# Patient Record
Sex: Female | Born: 1935 | Race: White | Hispanic: No | Marital: Married | Smoking: Never smoker
Health system: Southern US, Community
[De-identification: ages and names within clinical notes are randomized; demographics above are authoritative.]

## PROBLEM LIST (undated history)

## (undated) DIAGNOSIS — E669 Obesity, unspecified: Secondary | ICD-10-CM

## (undated) DIAGNOSIS — F329 Major depressive disorder, single episode, unspecified: Secondary | ICD-10-CM

## (undated) DIAGNOSIS — F32A Depression, unspecified: Secondary | ICD-10-CM

## (undated) DIAGNOSIS — I10 Essential (primary) hypertension: Secondary | ICD-10-CM

## (undated) DIAGNOSIS — E785 Hyperlipidemia, unspecified: Secondary | ICD-10-CM

## (undated) HISTORY — DX: Obesity, unspecified: E66.9

## (undated) HISTORY — DX: Major depressive disorder, single episode, unspecified: F32.9

## (undated) HISTORY — DX: Essential (primary) hypertension: I10

## (undated) HISTORY — PX: CHOLECYSTECTOMY: SHX55

## (undated) HISTORY — DX: Depression, unspecified: F32.A

## (undated) HISTORY — PX: ANKLE SURGERY: SHX546

## (undated) HISTORY — DX: Hyperlipidemia, unspecified: E78.5

---

## 2000-07-27 ENCOUNTER — Other Ambulatory Visit: Admission: RE | Admit: 2000-07-27 | Discharge: 2000-07-27 | Payer: Self-pay | Admitting: Family Medicine

## 2002-10-30 ENCOUNTER — Other Ambulatory Visit: Admission: RE | Admit: 2002-10-30 | Discharge: 2002-10-30 | Payer: Self-pay | Admitting: Family Medicine

## 2002-10-30 ENCOUNTER — Encounter: Payer: Self-pay | Admitting: Family Medicine

## 2002-10-30 LAB — CONVERTED CEMR LAB: Pap Smear: NORMAL

## 2003-08-06 ENCOUNTER — Emergency Department (HOSPITAL_COMMUNITY): Admission: EM | Admit: 2003-08-06 | Discharge: 2003-08-06 | Payer: Self-pay | Admitting: Emergency Medicine

## 2004-02-20 ENCOUNTER — Ambulatory Visit: Payer: Self-pay | Admitting: Family Medicine

## 2004-02-25 ENCOUNTER — Ambulatory Visit: Payer: Self-pay | Admitting: Family Medicine

## 2004-03-25 ENCOUNTER — Ambulatory Visit: Payer: Self-pay | Admitting: Family Medicine

## 2004-04-24 ENCOUNTER — Ambulatory Visit: Payer: Self-pay | Admitting: Family Medicine

## 2004-05-20 ENCOUNTER — Ambulatory Visit: Payer: Self-pay | Admitting: Family Medicine

## 2004-07-30 ENCOUNTER — Ambulatory Visit: Payer: Self-pay | Admitting: Family Medicine

## 2004-08-14 ENCOUNTER — Ambulatory Visit: Payer: Self-pay | Admitting: Family Medicine

## 2004-10-12 ENCOUNTER — Ambulatory Visit: Payer: Self-pay | Admitting: Family Medicine

## 2004-11-16 ENCOUNTER — Ambulatory Visit: Payer: Self-pay | Admitting: Family Medicine

## 2004-12-02 ENCOUNTER — Ambulatory Visit: Payer: Self-pay | Admitting: Family Medicine

## 2005-04-01 ENCOUNTER — Ambulatory Visit: Payer: Self-pay | Admitting: Family Medicine

## 2005-05-05 ENCOUNTER — Ambulatory Visit: Payer: Self-pay | Admitting: Family Medicine

## 2005-05-17 ENCOUNTER — Ambulatory Visit: Payer: Self-pay | Admitting: Family Medicine

## 2005-05-20 ENCOUNTER — Ambulatory Visit: Payer: Self-pay

## 2005-06-03 ENCOUNTER — Ambulatory Visit: Payer: Self-pay | Admitting: Family Medicine

## 2005-11-30 ENCOUNTER — Ambulatory Visit: Payer: Self-pay | Admitting: Family Medicine

## 2005-12-27 ENCOUNTER — Ambulatory Visit: Payer: Self-pay | Admitting: Family Medicine

## 2006-03-03 ENCOUNTER — Ambulatory Visit: Payer: Self-pay | Admitting: Family Medicine

## 2006-05-09 ENCOUNTER — Ambulatory Visit: Payer: Self-pay | Admitting: Family Medicine

## 2006-05-09 LAB — CONVERTED CEMR LAB
ALT: 17 units/L (ref 0–40)
AST: 22 units/L (ref 0–37)
Albumin: 3.2 g/dL — ABNORMAL LOW (ref 3.5–5.2)
CO2: 31 meq/L (ref 19–32)
Eosinophils Absolute: 0.2 10*3/uL (ref 0.0–0.6)
Eosinophils Relative: 3.9 % (ref 0.0–5.0)
GFR calc non Af Amer: 66 mL/min
Glucose, Bld: 159 mg/dL — ABNORMAL HIGH (ref 70–99)
HCT: 37.2 % (ref 36.0–46.0)
Hemoglobin: 13 g/dL (ref 12.0–15.0)
Hgb A1c MFr Bld: 7.5 %
Hgb A1c MFr Bld: 7.5 % — ABNORMAL HIGH (ref 4.6–6.0)
MCHC: 34.9 g/dL (ref 30.0–36.0)
MCV: 85.2 fL (ref 78.0–100.0)
Monocytes Absolute: 0.5 10*3/uL (ref 0.2–0.7)
Phosphorus: 3.2 mg/dL (ref 2.3–4.6)
Platelets: 262 10*3/uL (ref 150–400)
Potassium: 3.8 meq/L (ref 3.5–5.1)
Triglycerides: 128 mg/dL (ref 0–149)
VLDL: 26 mg/dL (ref 0–40)

## 2006-05-10 ENCOUNTER — Ambulatory Visit: Payer: Self-pay | Admitting: Family Medicine

## 2006-05-27 ENCOUNTER — Ambulatory Visit: Payer: Self-pay | Admitting: Family Medicine

## 2006-09-20 ENCOUNTER — Encounter: Payer: Self-pay | Admitting: Family Medicine

## 2006-09-20 DIAGNOSIS — E1140 Type 2 diabetes mellitus with diabetic neuropathy, unspecified: Secondary | ICD-10-CM | POA: Insufficient documentation

## 2006-09-20 DIAGNOSIS — E785 Hyperlipidemia, unspecified: Secondary | ICD-10-CM

## 2006-09-20 DIAGNOSIS — I10 Essential (primary) hypertension: Secondary | ICD-10-CM | POA: Insufficient documentation

## 2006-09-20 DIAGNOSIS — E1165 Type 2 diabetes mellitus with hyperglycemia: Secondary | ICD-10-CM

## 2006-09-20 DIAGNOSIS — M1990 Unspecified osteoarthritis, unspecified site: Secondary | ICD-10-CM

## 2006-09-20 DIAGNOSIS — F329 Major depressive disorder, single episode, unspecified: Secondary | ICD-10-CM

## 2006-09-20 DIAGNOSIS — E1169 Type 2 diabetes mellitus with other specified complication: Secondary | ICD-10-CM | POA: Insufficient documentation

## 2006-09-21 ENCOUNTER — Ambulatory Visit: Payer: Self-pay | Admitting: Family Medicine

## 2006-11-29 ENCOUNTER — Ambulatory Visit: Payer: Self-pay | Admitting: Family Medicine

## 2006-12-26 ENCOUNTER — Ambulatory Visit: Payer: Self-pay | Admitting: Family Medicine

## 2006-12-27 LAB — CONVERTED CEMR LAB
Cholesterol: 142 mg/dL (ref 0–200)
LDL Cholesterol: 66 mg/dL (ref 0–99)
VLDL: 27 mg/dL (ref 0–40)

## 2007-01-13 ENCOUNTER — Telehealth (INDEPENDENT_AMBULATORY_CARE_PROVIDER_SITE_OTHER): Payer: Self-pay | Admitting: *Deleted

## 2007-03-21 ENCOUNTER — Ambulatory Visit: Payer: Self-pay | Admitting: Family Medicine

## 2007-03-23 LAB — CONVERTED CEMR LAB: Hgb A1c MFr Bld: 6.9 % — ABNORMAL HIGH (ref 4.6–6.0)

## 2007-05-15 ENCOUNTER — Ambulatory Visit: Payer: Self-pay | Admitting: Family Medicine

## 2007-06-13 ENCOUNTER — Ambulatory Visit: Payer: Self-pay | Admitting: Family Medicine

## 2007-06-14 ENCOUNTER — Encounter: Payer: Self-pay | Admitting: Family Medicine

## 2007-06-14 ENCOUNTER — Encounter (INDEPENDENT_AMBULATORY_CARE_PROVIDER_SITE_OTHER): Payer: Self-pay | Admitting: *Deleted

## 2007-06-14 ENCOUNTER — Ambulatory Visit: Payer: Self-pay | Admitting: Family Medicine

## 2007-06-15 ENCOUNTER — Encounter (INDEPENDENT_AMBULATORY_CARE_PROVIDER_SITE_OTHER): Payer: Self-pay | Admitting: *Deleted

## 2007-06-21 ENCOUNTER — Ambulatory Visit: Payer: Self-pay | Admitting: Family Medicine

## 2007-06-21 DIAGNOSIS — M810 Age-related osteoporosis without current pathological fracture: Secondary | ICD-10-CM | POA: Insufficient documentation

## 2007-08-14 ENCOUNTER — Ambulatory Visit: Payer: Self-pay | Admitting: Family Medicine

## 2007-08-17 LAB — CONVERTED CEMR LAB
ALT: 17 units/L (ref 0–35)
AST: 24 units/L (ref 0–37)
Albumin: 3.1 g/dL — ABNORMAL LOW (ref 3.5–5.2)
BUN: 16 mg/dL (ref 6–23)
CO2: 32 meq/L (ref 19–32)
Cholesterol: 154 mg/dL (ref 0–200)
Creatinine,U: 90.7 mg/dL
GFR calc Af Amer: 80 mL/min
GFR calc non Af Amer: 66 mL/min
Hgb A1c MFr Bld: 7.9 % — ABNORMAL HIGH (ref 4.6–6.0)
LDL Cholesterol: 79 mg/dL (ref 0–99)
Microalb, Ur: 0.8 mg/dL (ref 0.0–1.9)
Sodium: 142 meq/L (ref 135–145)
Total CHOL/HDL Ratio: 3.2

## 2007-08-21 ENCOUNTER — Ambulatory Visit: Payer: Self-pay | Admitting: Family Medicine

## 2007-10-09 ENCOUNTER — Ambulatory Visit: Payer: Self-pay | Admitting: Family Medicine

## 2008-01-08 ENCOUNTER — Ambulatory Visit: Payer: Self-pay | Admitting: Family Medicine

## 2008-01-09 LAB — CONVERTED CEMR LAB
AST: 22 units/L (ref 0–37)
BUN: 16 mg/dL (ref 6–23)
Bilirubin, Direct: 0.1 mg/dL (ref 0.0–0.3)
Calcium: 9.6 mg/dL (ref 8.4–10.5)
Chloride: 102 meq/L (ref 96–112)
Creatinine, Ser: 1 mg/dL (ref 0.4–1.2)
Creatinine,U: 101.4 mg/dL
GFR calc Af Amer: 70 mL/min
GFR calc non Af Amer: 58 mL/min
Glucose, Bld: 122 mg/dL — ABNORMAL HIGH (ref 70–99)
HDL: 61.8 mg/dL (ref >39.0)
Microalb Creat Ratio: 5.9 mg/g (ref 0.0–30.0)
Sodium: 142 meq/L (ref 135–145)
Total Bilirubin: 0.8 mg/dL (ref 0.3–1.2)
VLDL: 29 mg/dL (ref 0–40)

## 2008-01-23 ENCOUNTER — Ambulatory Visit: Payer: Self-pay | Admitting: Family Medicine

## 2008-04-23 ENCOUNTER — Ambulatory Visit: Payer: Self-pay | Admitting: Family Medicine

## 2008-04-24 LAB — CONVERTED CEMR LAB
ALT: 16 units/L (ref 0–35)
AST: 20 units/L (ref 0–37)

## 2008-05-01 ENCOUNTER — Ambulatory Visit: Payer: Self-pay | Admitting: Family Medicine

## 2008-05-01 LAB — CONVERTED CEMR LAB
Bilirubin Urine: NEGATIVE
Glucose, Urine, Semiquant: NEGATIVE
Nitrite: NEGATIVE
Protein, U semiquant: NEGATIVE
Yeast, UA: 0

## 2008-05-02 ENCOUNTER — Encounter: Payer: Self-pay | Admitting: Family Medicine

## 2008-06-17 ENCOUNTER — Ambulatory Visit: Payer: Self-pay | Admitting: Family Medicine

## 2008-06-17 ENCOUNTER — Encounter: Payer: Self-pay | Admitting: Family Medicine

## 2008-06-19 ENCOUNTER — Ambulatory Visit: Payer: Self-pay | Admitting: Family Medicine

## 2008-06-21 ENCOUNTER — Encounter (INDEPENDENT_AMBULATORY_CARE_PROVIDER_SITE_OTHER): Payer: Self-pay | Admitting: *Deleted

## 2008-08-01 ENCOUNTER — Ambulatory Visit: Payer: Self-pay | Admitting: Family Medicine

## 2008-08-02 LAB — CONVERTED CEMR LAB
Albumin: 3.4 g/dL — ABNORMAL LOW (ref 3.5–5.2)
BUN: 23 mg/dL (ref 6–23)
CO2: 33 meq/L — ABNORMAL HIGH (ref 19–32)
Chloride: 104 meq/L (ref 96–112)
Cholesterol: 138 mg/dL (ref 0–200)
Creatinine, Ser: 1 mg/dL (ref 0.4–1.2)
Glucose, Bld: 127 mg/dL — ABNORMAL HIGH (ref 70–99)
HDL: 51.8 mg/dL (ref >39.00)
LDL Cholesterol: 63 mg/dL (ref 0–99)
Phosphorus: 3.7 mg/dL (ref 2.3–4.6)
Triglycerides: 115 mg/dL (ref 0.0–149.0)

## 2008-08-12 LAB — HM DIABETES EYE EXAM: HM Diabetic Eye Exam: NORMAL

## 2008-09-18 ENCOUNTER — Ambulatory Visit: Payer: Self-pay | Admitting: Family Medicine

## 2008-11-06 ENCOUNTER — Telehealth: Payer: Self-pay | Admitting: Family Medicine

## 2008-11-28 ENCOUNTER — Ambulatory Visit: Payer: Self-pay | Admitting: Family Medicine

## 2008-11-28 LAB — CONVERTED CEMR LAB
ALT: 16 units/L (ref 0–35)
AST: 22 units/L (ref 0–37)
BUN: 17 mg/dL (ref 6–23)
Calcium: 9.7 mg/dL (ref 8.4–10.5)
Creatinine, Ser: 1 mg/dL (ref 0.4–1.2)
Glucose, Bld: 118 mg/dL — ABNORMAL HIGH (ref 70–99)
Phosphorus: 3.2 mg/dL (ref 2.3–4.6)
Sodium: 141 meq/L (ref 135–145)

## 2009-03-24 ENCOUNTER — Ambulatory Visit: Payer: Self-pay | Admitting: Family Medicine

## 2009-04-08 ENCOUNTER — Ambulatory Visit: Payer: Self-pay | Admitting: Family Medicine

## 2009-09-19 ENCOUNTER — Ambulatory Visit: Payer: Self-pay | Admitting: Family Medicine

## 2009-09-20 LAB — CONVERTED CEMR LAB
ALT: 19 units/L (ref 0–35)
Cholesterol: 177 mg/dL (ref 0–200)
Eosinophils Absolute: 0.4 10*3/uL (ref 0.0–0.7)
Eosinophils Relative: 5.3 % — ABNORMAL HIGH (ref 0.0–5.0)
HCT: 35 % — ABNORMAL LOW (ref 36.0–46.0)
HDL: 45 mg/dL (ref >39.00)
Hemoglobin: 12 g/dL (ref 12.0–15.0)
Lymphocytes Relative: 24 % (ref 12.0–46.0)
Lymphs Abs: 1.8 10*3/uL (ref 0.7–4.0)
MCHC: 34.3 g/dL (ref 30.0–36.0)
Microalb Creat Ratio: 1.7 mg/g (ref 0.0–30.0)
Monocytes Relative: 8.4 % (ref 3.0–12.0)
Neutrophils Relative %: 61.8 % (ref 43.0–77.0)
RDW: 13.9 % (ref 11.5–14.6)
TSH: 4.77 microintl units/mL (ref 0.35–5.50)
Total CHOL/HDL Ratio: 4
WBC: 7.5 10*3/uL (ref 4.5–10.5)

## 2009-10-02 ENCOUNTER — Ambulatory Visit: Payer: Self-pay | Admitting: Family Medicine

## 2009-10-02 DIAGNOSIS — F4321 Adjustment disorder with depressed mood: Secondary | ICD-10-CM | POA: Insufficient documentation

## 2009-12-31 ENCOUNTER — Ambulatory Visit: Payer: Self-pay | Admitting: Family Medicine

## 2009-12-31 LAB — CONVERTED CEMR LAB
AST: 25 units/L (ref 0–37)
Albumin: 3.4 g/dL — ABNORMAL LOW (ref 3.5–5.2)
BUN: 24 mg/dL — ABNORMAL HIGH (ref 6–23)
CO2: 31 meq/L (ref 19–32)
Creatinine, Ser: 0.9 mg/dL (ref 0.4–1.2)
GFR calc non Af Amer: 65.33 mL/min (ref >60)
Glucose, Bld: 140 mg/dL — ABNORMAL HIGH (ref 70–99)
HDL: 49.3 mg/dL (ref >39.00)
Sodium: 142 meq/L (ref 135–145)
Total CHOL/HDL Ratio: 3
Triglycerides: 190 mg/dL — ABNORMAL HIGH (ref 0.0–149.0)

## 2010-01-05 ENCOUNTER — Ambulatory Visit: Payer: Self-pay | Admitting: Family Medicine

## 2010-04-07 ENCOUNTER — Ambulatory Visit
Admission: RE | Admit: 2010-04-07 | Discharge: 2010-04-07 | Payer: Self-pay | Source: Home / Self Care | Attending: Family Medicine | Admitting: Family Medicine

## 2010-04-08 LAB — CONVERTED CEMR LAB
AST: 21 units/L (ref 0–37)
Albumin: 3.2 g/dL — ABNORMAL LOW (ref 3.5–5.2)
BUN: 23 mg/dL (ref 6–23)
Bilirubin, Direct: 0.1 mg/dL (ref 0.0–0.3)
Calcium: 10 mg/dL (ref 8.4–10.5)
Creatinine, Ser: 0.8 mg/dL (ref 0.4–1.2)
GFR calc non Af Amer: 72.68 mL/min (ref >60.00)
Phosphorus: 2.8 mg/dL (ref 2.3–4.6)

## 2010-04-10 ENCOUNTER — Ambulatory Visit
Admission: RE | Admit: 2010-04-10 | Discharge: 2010-04-10 | Payer: Self-pay | Source: Home / Self Care | Attending: Family Medicine | Admitting: Family Medicine

## 2010-04-10 DIAGNOSIS — M25569 Pain in unspecified knee: Secondary | ICD-10-CM | POA: Insufficient documentation

## 2010-04-15 ENCOUNTER — Encounter (INDEPENDENT_AMBULATORY_CARE_PROVIDER_SITE_OTHER): Payer: Self-pay | Admitting: *Deleted

## 2010-05-05 NOTE — Assessment & Plan Note (Signed)
Summary: 3 MONTH FOLLOW UP/[facility]   Vital Signs:  Patient profile:   75 year old female Height:      63 inches Weight:      192 pounds BMI:     34.13 Temp:     98.4 degrees F oral Pulse rate:   80 / minute Pulse rhythm:   regular BP sitting:   130 / 72  (left arm) Cuff size:   large  Vitals Entered By: Lewanda Rife LPN (January 05, 2010 9:09 AM)  Serial Vital Signs/Assessments:  Time      Position  BP       Pulse  Resp  Temp     By                     130/72                         Judith Part MD  CC: three month f/u   History of Present Illness: here for f/u of lipid/ DM and HTN   bp still up at 148/76-- then 130/72 after 10 min of sitting  AIC 7.6- fairly stable - not optimal  is more or less on max oral tx  not much exercise -- not motivated to do it -- also chronic pain limits her to short distances  is trying with diet -- is cheating occasionally  portions are small  bread is biggest problem , not a lot of pasta or potato  has good understanding of DM diet  ams - good one teens to 130  pm usually higher some 160s- low 200s    lipids are much better with trig 190, HDL 49 and LDL 64 on zocor   wt is stable   grief rxn-- just a little bit better  thinks she will eventually be ok -- but stays down and dismotivated at times     Allergies: 1)  ! Fosamax  Past History:  Past Medical History: Last updated: 05/01/2008 Depression Diabetes mellitus, type II Hypertension Osteoarthritis Hyperlipidemia osteoporosis obesity  opthy- Dr Clayborne Dana   Past Surgical History: Last updated: 06/21/2007 Cholecystectomy ABI's- normal (10/1999) S/ p fall- ankle surgery 92005) ORIF left medial malleblus fracture Adenosine cardiolite- neg EF 77% (11/2003) Carotid dopplers-minimal plaque (05/2005) dexa 09/11/2022 OP  Family History: Last updated: 2008-07-19 Father: deceased- bronchitis, asthma Mother: deceased- DM, HTN Siblings: 2 brothers with asthma, 1 sister with HTN MGM  breast cancer, DM sister has lung cancer- deceased 11-Sep-2022 brother copd   Social History: Last updated: 01/23/2008 Marital Status: Married Children: 7  Never Smoked  Risk Factors: Smoking Status: never (05/15/2007)  Review of Systems General:  Complains of fatigue. Eyes:  Denies blurring, eye irritation, and eye pain. CV:  Denies chest pain or discomfort, lightheadness, and palpitations. Resp:  Denies cough and shortness of breath. GI:  Denies change in bowel habits, indigestion, nausea, and vomiting. MS:  Denies muscle aches and cramps. Derm:  Denies itching, lesion(s), poor wound healing, and rash. Neuro:  Denies numbness and tingling. Psych:  Complains of depression; denies panic attacks, sense of great danger, and suicidal thoughts/plans. Endo:  Denies cold intolerance, excessive thirst, excessive urination, and heat intolerance. Heme:  Denies abnormal bruising and bleeding.  Physical Exam  General:  overweight but generally well appearing  Head:  normocephalic, atraumatic, and no abnormalities observed.   Eyes:  vision grossly intact, pupils equal, pupils round, and pupils reactive to light.  no conjunctival pallor, injection or icterus  Mouth:  pharynx pink and moist.   Neck:  supple with full rom and no masses or thyromegally, no JVD or carotid bruit  Chest Wall:  No deformities, masses, or tenderness noted. Lungs:  Normal respiratory effort, chest expands symmetrically. Lungs are clear to auscultation, no crackles or wheezes. Heart:  Normal rate and regular rhythm. S1 and S2 normal without gallop, murmur, click, rub or other extra sounds. Abdomen:  Bowel sounds positive,abdomen soft and non-tender without masses, organomegaly or hernias noted. no renal bruits  Msk:  No deformity or scoliosis noted of thoracic or lumbar spine.   Pulses:  R and L carotid,radial,femoral,dorsalis pedis and posterior tibial pulses are full and equal bilaterally Extremities:  No clubbing,  cyanosis, edema, or deformity noted with normal full range of motion of all joints.   Neurologic:  sensation intact to light touch, gait normal, and DTRs symmetrical and normal.   Skin:  Intact without suspicious lesions or rashes Cervical Nodes:  No lymphadenopathy noted Inguinal Nodes:  No significant adenopathy Psych:  seems generally down but a bit improved from last visit   Diabetes Management Exam:    Foot Exam (with socks and/or shoes not present):       Sensory-Pinprick/Light touch:          Left medial foot (L-4): normal          Left dorsal foot (L-5): normal          Left lateral foot (S-1): normal          Right medial foot (L-4): normal          Right dorsal foot (L-5): normal          Right lateral foot (S-1): normal       Sensory-Monofilament:          Left foot: normal          Right foot: normal       Inspection:          Left foot: normal          Right foot: normal       Nails:          Left foot: normal          Right foot: normal   Impression & Recommendations:  Problem # 1:  HYPERLIPIDEMIA (ICD-272.4) Assessment Improved  this is much imp with zocor compliance with better diet  rev lab with pt  rev low sat fat diet in detail f/u 3 mo Her updated medication list for this problem includes:    Zocor 20 Mg Tabs (Simvastatin) .Marland Kitchen... 1 by mouth once daily  Labs Reviewed: SGOT: 25 (12/31/2009)   SGPT: 19 (12/31/2009)   HDL:49.30 (12/31/2009), 45.00 (09/19/2009)  LDL:64 (12/31/2009), 63 (08/01/2008)  Chol:151 (12/31/2009), 177 (09/19/2009)  Trig:190.0 (12/31/2009), 239.0 (09/19/2009)  Orders: Prescription Created Electronically [phone])  Problem # 2:  HYPERTENSION (ICD-401.9) Assessment: Improved  better on 2nd check  ok at home enc whatever activity she can do  f/u 3 mo  Her updated medication list for this problem includes:    Benicar Hct 40-25 Mg Tabs (Olmesartan medoxomil-hctz) .Marland Kitchen... Take one by mouth daily  BP today: 130/72 Prior BP: 148/68  (10/02/2009)  Labs Reviewed: K+: 4.2 (12/31/2009) Creat: : 0.9 (12/31/2009)   Chol: 151 (12/31/2009)   HDL: 49.30 (12/31/2009)   LDL: 64 (12/31/2009)   TG: 190.0 (12/31/2009)  Orders: Prescription Created Electronically [phone])  Problem # 3:  DIABETES MELLITUS, TYPE II (ICD-250.00) Assessment: Deteriorated  this is worse today diet is so / so - rev this  activity is limited by chronic pain inc actos to 30 with warnings about swelling/ sob -- adv to call if any side eff and pt voiced understanding if no successs- will need to disc inj tx  lab and f/u 3 mo  check sugar two times a day  Her updated medication list for this problem includes:    Glucophage 1000 Mg Tabs (Metformin hcl) .Marland Kitchen... Take one by mouth two times a day    Glucotrol Xl 10 Mg Tb24 (Glipizide) .Marland Kitchen... Take one by mouth daily    Benicar Hct 40-25 Mg Tabs (Olmesartan medoxomil-hctz) .Marland Kitchen... Take one by mouth daily    Adult Aspirin Low Strength 81 Mg Tbdp (Aspirin) .Marland Kitchen... Take one by mouth daily    Actos 30 Mg Tabs (Pioglitazone hcl) .Marland Kitchen... 1 by mouth once daily  Orders: Prescription Created Electronically [phone])  Problem # 4:  GRIEF REACTION (ICD-309.0) Assessment: Unchanged  this is stable  pt has good support and says she is doing ok  this aff her motivation however  Orders: Prescription Created Electronically [phone])  Complete Medication List: 1)  Glucophage 1000 Mg Tabs (Metformin hcl) .... Take one by mouth two times a day 2)  Glucotrol Xl 10 Mg Tb24 (Glipizide) .... Take one by mouth daily 3)  Prozac 40 Mg Caps (Fluoxetine hcl) .... Take one by mouth daily 4)  Zocor 20 Mg Tabs (Simvastatin) .Marland Kitchen.. 1 by mouth once daily 5)  Benicar Hct 40-25 Mg Tabs (Olmesartan medoxomil-hctz) .... Take one by mouth daily 6)  Adult Aspirin Low Strength 81 Mg Tbdp (Aspirin) .... Take one by mouth daily 7)  Flexeril 10 Mg Tabs (Cyclobenzaprine hcl) .... 1/2 to 1 by mouth three times a day as needed neck pain 8)  Actos 30 Mg Tabs  (Pioglitazone hcl) .Marland Kitchen.. 1 by mouth once daily  Other Orders: Flu Vaccine 73yrs + MEDICARE PATIENTS (J4782) Administration Flu vaccine - MCR (N5621)  Patient Instructions: 1)  increase your total actos dose to 30 mg once daily  (new px will be for 30) 2)  keep checking sugar two times a day  3)  eat diabetic diet  4)  exercise when you can - stay active  5)  flu shot today  6)  schedule labs AIC, renal , hepatic 250.0, in 3 months and then f/u Prescriptions: ZOCOR 20 MG  TABS (SIMVASTATIN) 1 by mouth once daily  #90 x 3   Entered and Authorized by:   Judith Part MD   Signed by:   Judith Part MD on 01/05/2010   Method used:   Electronically to        [facility]. [address] [phone]* (retail)       [address] [place], [place]  [address]       Ph: [phone]       Fax: [phone]   RxID:   [id number] PROZAC 40 MG  CAPS (FLUOXETINE HCL) take one by mouth daily  #90 x 3   Entered and Authorized by:   Judith Part MD   Signed by:   Judith Part MD on 01/05/2010   Method used:   Electronically to        [facility]. [employer] [facility] [phone]* (retail)       [address]  [place], [place]  [address]       Ph: [phone]       Fax: [phone]   RxID:   [id number] GLUCOTROL XL 10 MG  TB24 (GLIPIZIDE) take one by mouth daily  #90 x 3   Entered and Authorized by:   Judith Part MD   Signed by:   Judith Part MD on 01/05/2010   Method used:   Electronically to        [facility]. [address] [phone]* (retail)       [address] [place], [place]  [id number]       Ph: [phone]       Fax: [phone]   RxID:   [id number] GLUCOPHAGE 1000 MG  TABS (METFORMIN HCL) take one by mouth two times a day  #180 x 3   Entered and Authorized by:   Judith Part MD   Signed by:   Judith Part MD on 01/05/2010   Method used:   Electronically to        [facility]. [address] [phone]* (retail)       [address] [place],  [place]  [address]       Ph: [phone]       Fax: [phone]   RxID:   [id number] ACTOS 30 MG TABS (PIOGLITAZONE HCL) 1 by mouth once daily  #90 x 3   Entered and Authorized by:   Judith Part MD   Signed by:   Judith Part MD on 01/05/2010   Method used:   Electronically to        [facility]. [address] [phone]* (retail)       [address] [place], [place]  [address]       Ph: [phone]       Fax: [phone]   RxID:   [id number]   Current Allergies (reviewed today): ! FOSAMAX    Flu Vaccine Consent Questions     Do you have a history of severe allergic reactions to this vaccine? no    Any prior history of allergic reactions to egg and/or gelatin? no    Do you have a sensitivity to the preservative Thimersol? no    Do you have a past history of Guillan-Barre Syndrome? no    Do you currently have an acute febrile illness? no    Have you ever had a severe reaction to latex? no    Vaccine information given and explained to patient? yes    Are you currently pregnant? no    Lot Number:[id number]   Exp Date:10/03/2010   Site Given  Left Deltoid IMedflu Lewanda Rife LPN  January 05, 2010 9:14 AM

## 2010-05-05 NOTE — Assessment & Plan Note (Signed)
Summary: 30 MIN APPT 6 MONTH FOLLOW UP/[facility]   Vital Signs:  Patient profile:   75 year old female Height:      63 inches Weight:      192.50 pounds BMI:     34.22 Temp:     98.5 degrees F oral Pulse rate:   80 / minute Pulse rhythm:   regular BP sitting:   148 / 68  (left arm) Cuff size:   large  Vitals Entered By: Lewanda Rife LPN (October 02, 2009 9:18 AM)  Serial Vital Signs/Assessments:  Time      Position  BP       Pulse  Resp  Temp     By                     138/80                         Judith Part MD  CC: six month f/u   History of Present Illness: here for f/u of HTN and /dm and lipids  had a bad month overall  daughter and her husband were killed in a car accident - was a big shock  has a lot of family around -- good support  has had some counseling from her preacher   does not feel like she needs to talk to someone professionally  has been just in the dumps   feeling physically ok she thinks   wt is stable   bp is 148/68 today first check    lipids up with trig 239 and LDL 107 up from the 60s  no missed doses of zocor  once in a while fried food -- everyone brought chicken to her house- eating what people are bringing over    dm is worse - AIC is 7.5 (from 6.9) and also inc microalb  ? if worse from stress  is eating off and on - and staying sugar free for the most part -- does eat a lot of fruit  does not know exactly what she ate   no problems with her medicines   broke out into a rash - back and [place] -- is clearing up now - left her skin rough  no fever or insect bites no new products- but does use sunscreen   Allergies: 1)  ! Fosamax  Past History:  Past Medical History: Last updated: 05/01/2008 Depression Diabetes mellitus, type II Hypertension Osteoarthritis Hyperlipidemia osteoporosis obesity  opthy- Dr Clayborne Dana   Past Surgical History: Last updated: 06/21/2007 Cholecystectomy ABI's- normal (10/1999) S/ p fall- ankle  surgery 92005) ORIF left medial malleblus fracture Adenosine cardiolite- neg EF 77% (11/2003) Carotid dopplers-minimal plaque (05/2005) dexa 08-29-22 OP  Family History: Last updated: Jul 06, 2008 Father: deceased- bronchitis, asthma Mother: deceased- DM, HTN Siblings: 2 brothers with asthma, 1 sister with HTN MGM breast cancer, DM sister has lung cancer- deceased Aug 29, 2022 brother copd   Social History: Last updated: 01/23/2008 Marital Status: Married Children: 7  Never Smoked  Risk Factors: Smoking Status: never (05/15/2007)  Review of Systems General:  Denies fatigue and malaise. CV:  Denies chest pain or discomfort, lightheadness, and palpitations. Resp:  Denies cough and wheezing. GI:  Denies abdominal pain, change in bowel habits, and indigestion. MS:  Denies muscle aches and cramps. Derm:  Denies lesion(s), poor wound healing, and rash. Neuro:  Denies numbness and tingling. Psych:  Complains of easily tearful. Endo:  Denies excessive thirst and  excessive urination.  Physical Exam  General:  overweight but generally well appearing  Head:  normocephalic, atraumatic, and no abnormalities observed.   Eyes:  vision grossly intact, pupils equal, pupils round, and pupils reactive to light.   Neck:  supple with full rom and no masses or thyromegally, no JVD or carotid bruit  Lungs:  Normal respiratory effort, chest expands symmetrically. Lungs are clear to auscultation, no crackles or wheezes. Heart:  Normal rate and regular rhythm. S1 and S2 normal without gallop, murmur, click, rub or other extra sounds. Abdomen:  Bowel sounds positive,abdomen soft and non-tender without masses, organomegaly or hernias noted. no renal bruits  Msk:  No deformity or scoliosis noted of thoracic or lumbar spine.   Pulses:  R and L carotid,radial,femoral,dorsalis pedis and posterior tibial pulses are full and equal bilaterally Extremities:  No clubbing, cyanosis, edema, or deformity noted with normal full  range of motion of all joints.   Neurologic:  sensation intact to light touch, gait normal, and DTRs symmetrical and normal.   Skin:  Intact without suspicious lesions or rashes Cervical Nodes:  No lymphadenopathy noted Inguinal Nodes:  No significant adenopathy Psych:  is generally sad but not tearful  seems fatigued  good eye contact  Diabetes Management Exam:    Foot Exam (with socks and/or shoes not present):       Sensory-Pinprick/Light touch:          Left medial foot (L-4): normal          Left dorsal foot (L-5): normal          Left lateral foot (S-1): normal          Right medial foot (L-4): normal          Right dorsal foot (L-5): normal          Right lateral foot (S-1): normal       Sensory-Monofilament:          Left foot: normal          Right foot: normal       Inspection:          Left foot: normal          Right foot: normal       Nails:          Left foot: normal          Right foot: normal   Impression & Recommendations:  Problem # 1:  HYPERLIPIDEMIA (ICD-272.4) Assessment Deteriorated  this is up due to poor diet with grief rxn disc getting back to low sat fat diet and pt is knowledgable about this  re check 3 mo and f/u Her updated medication list for this problem includes:    Zocor 20 Mg Tabs (Simvastatin) .Marland Kitchen... 1 by mouth once daily  Labs Reviewed: SGOT: 24 (09/19/2009)   SGPT: 19 (09/19/2009)   HDL:45.00 (09/19/2009), 51.80 (08/01/2008)  LDL:63 (08/01/2008), 71 (01/08/2008)  Chol:177 (09/19/2009), 138 (08/01/2008)  Trig:239.0 (09/19/2009), 115.0 (08/01/2008)  Problem # 2:  HYPERTENSION (ICD-401.9) Assessment: Deteriorated  bp imp on second check - pt also nervous today with grief rxn re check at f/u Her updated medication list for this problem includes:    Benicar Hct 40-25 Mg Tabs (Olmesartan medoxomil-hctz) .Marland Kitchen... Take one by mouth daily  BP today: 148/68-- re check 138/80 Prior BP: 132/78 (04/08/2009)  Labs Reviewed: K+: 3.7  (11/28/2008) Creat: : 1.0 (11/28/2008)   Chol: 177 (09/19/2009)   HDL: 45.00 (09/19/2009)   LDL:  63 (08/01/2008)   TG: 239.0 (09/19/2009)  BP today: 148/68 Prior BP: 132/78 (04/08/2009)  Labs Reviewed: K+: 3.7 (11/28/2008) Creat: : 1.0 (11/28/2008)   Chol: 177 (09/19/2009)   HDL: 45.00 (09/19/2009)   LDL: 63 (08/01/2008)   TG: 239.0 (09/19/2009)  Problem # 3:  DIABETES MELLITUS, TYPE II (ICD-250.00) Assessment: Deteriorated  worse control with poor diet during grief rxn  disc getting back to normal diet - with less sugar and fat  plans to go to beach and do lot of walking also  no change in med - lab 3 mo and f/u Her updated medication list for this problem includes:    Glucophage 1000 Mg Tabs (Metformin hcl) .Marland Kitchen... Take one by mouth two times a day    Glucotrol Xl 10 Mg Tb24 (Glipizide) .Marland Kitchen... Take one by mouth daily    Benicar Hct 40-25 Mg Tabs (Olmesartan medoxomil-hctz) .Marland Kitchen... Take one by mouth daily    Adult Aspirin Low Strength 81 Mg Tbdp (Aspirin) .Marland Kitchen... Take one by mouth daily    Actos 15 Mg Tabs (Pioglitazone hcl) .Marland Kitchen... 1 by mouth each am  Labs Reviewed: Creat: 1.0 (11/28/2008)     Last Eye Exam: normal (08/03/2008) Reviewed HgBA1c results: 7.5 (09/19/2009)  6.9 (11/28/2008)  Problem # 4:  GRIEF REACTION (ICD-309.0) with loss of son and DIL in auto accident  overall doing fairly- good support with family and pastoral counseling  appetite coming back  offered counseling if needed - mental health -- she declined this or med  disc stressors/ coping mech and symptoms in detail today  Complete Medication List: 1)  Glucophage 1000 Mg Tabs (Metformin hcl) .... Take one by mouth two times a day 2)  Glucotrol Xl 10 Mg Tb24 (Glipizide) .... Take one by mouth daily 3)  Prozac 40 Mg Caps (Fluoxetine hcl) .... Take one by mouth daily 4)  Zocor 20 Mg Tabs (Simvastatin) .Marland Kitchen.. 1 by mouth once daily 5)  Benicar Hct 40-25 Mg Tabs (Olmesartan medoxomil-hctz) .... Take one by mouth  daily 6)  Adult Aspirin Low Strength 81 Mg Tbdp (Aspirin) .... Take one by mouth daily 7)  Flexeril 10 Mg Tabs (Cyclobenzaprine hcl) .... 1/2 to 1 by mouth three times a day as needed neck pain 8)  Actos 15 Mg Tabs (Pioglitazone hcl) .Marland Kitchen.. 1 by mouth each am  Patient Instructions: 1)  for rash - use a moisturizer like eucerin or lubriderm lotion/ creams  2)  aaveno oatmeal bath is helpful 3)  avoid hot water  4)  update me if not improving  5)  no change in medicines 6)  update me if you feel you need to see a mental health counselor - continue talking to your family and pastor  7)  schdule fasting lab 3 months and then follow up lipid/ast/alt / renal /AIC 250.0 and 272   Current Allergies (reviewed today): ! FOSAMAX

## 2010-05-05 NOTE — Assessment & Plan Note (Signed)
Summary: 2 WEEK FOLLOW UP BP CHECK AND FLU SHOT/[facility]   Nurse Visit   Vital Signs:  Patient profile:   75 year old female Weight:      192 pounds Pulse rate:   80 / minute Pulse rhythm:   regular BP sitting:   132 / 78  (left arm) Cuff size:   large  Vitals Entered By: Lowella Petties CMA (April 08, 2009 3:43 PM) CC: Nurse visit-  BP check, flu shot   Allergies: 1)  ! Fosamax  Immunizations Administered:  Influenza Vaccine # 1:    Vaccine Type: Fluvax MCR    Site: left deltoid    Mfr: GlaxoSmithK[employer]    Dose: 0.5 ml    Route: IM    Given by: Lowella Petties CMA    Exp. Date: 10/02/2009    Lot #: [id number]    VIS given: 10/27/06 version given April 08, 2009.  Orders Added: 1)  Influenza Vaccine MCR [00025] 2)  Est. Patient Level I [66440]

## 2010-05-07 NOTE — Miscellaneous (Signed)
Summary: med list update  Medications Added ACTOS 15 MG TABS (PIOGLITAZONE HCL) take one by mouth daily       Clinical Lists Changes  Medications: Changed medication from ACTOS 30 MG TABS (PIOGLITAZONE HCL) 1/2  by mouth once daily to ACTOS 15 MG TABS (PIOGLITAZONE HCL) take one by mouth daily     Prior Medications: GLUCOPHAGE 1000 MG  TABS (METFORMIN HCL) take one by mouth two times a day GLUCOTROL XL 10 MG  TB24 (GLIPIZIDE) take one by mouth daily PROZAC 40 MG  CAPS (FLUOXETINE HCL) take one by mouth daily ZOCOR 20 MG  TABS (SIMVASTATIN) 1 by mouth once daily BENICAR HCT 40-25 MG  TABS (OLMESARTAN MEDOXOMIL-HCTZ) take one by mouth daily ADULT ASPIRIN LOW STRENGTH 81 MG  TBDP (ASPIRIN) take one by mouth daily FLEXERIL 10 MG  TABS (CYCLOBENZAPRINE HCL) 1/2 to 1 by mouth three times a day as needed neck pain ACTOS 15 MG TABS (PIOGLITAZONE HCL) take one by mouth daily WELLBUTRIN XL 150 MG XR24H-TAB (BUPROPION HCL) 1 by mouth once daily in am Current Allergies: ! FOSAMAX

## 2010-05-07 NOTE — Assessment & Plan Note (Signed)
Summary: F/U AFTER LABS / LFW   Vital Signs:  Patient profile:   75 year old female Height:      63 inches Weight:      190 pounds BMI:     33.78 Temp:     98.8 degrees F oral Pulse rate:   80 / minute Pulse rhythm:   regular BP sitting:   148 / 80  (left arm) Cuff size:   large  Vitals Entered By: Lewanda Rife LPN (April 10, 2010 8:30 AM) CC: three month f/u after labs   History of Present Illness: here for f/u of DM after inc in actos and HTN - also having GI distress, R knee pain and possible worse depression   has had a lot of gas for the past couple of months-- flatus and also heartburn a lot  ? 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 Fosamax  Past History:  Past Medical History: Last updated: 05/01/2008 Depression Diabetes mellitus, type II Hypertension Osteoarthritis Hyperlipidemia osteoporosis obesity  opthy- Dr Clayborne Dana   Past Surgical History: Last updated: 06/21/2007 Cholecystectomy ABI's- normal (10/1999) S/ p fall- ankle surgery 92005) ORIF left medial malleblus fracture Adenosine cardiolite- neg EF 77% (11/2003) Carotid dopplers-minimal plaque (05/2005) dexa 09-06-2022 OP  Family History: Last updated: 07/14/2008 Father: deceased- bronchitis, asthma Mother: deceased- DM, HTN Siblings: 2 brothers with asthma, 1 sister with HTN MGM breast cancer, DM sister has lung cancer-  deceased Sep 06, 2022 brother copd   Social History: Last updated: 01/23/2008 Marital Status: Married Children: 7  Never Smoked  Risk Factors: Smoking Status: never (05/15/2007)  Review of Systems General:  Complains of fatigue; denies loss of appetite and malaise. Eyes:  Denies blurring and eye irritation. CV:  Denies chest pain or discomfort, palpitations, and shortness of breath with exertion. Resp:  Denies cough and shortness of breath. GI:  Complains of change in bowel habits, gas, and indigestion; denies loss of appetite, nausea, and vomiting. MS:  Complains of joint pain; denies joint redness and joint swelling. Neuro:  Denies headaches, numbness, and tingling. Psych:  Complains of depression and irritability; denies suicidal thoughts/plans. Endo:  Denies cold intolerance, excessive thirst, excessive urination, and heat intolerance. Heme:  Denies abnormal bruising and bleeding.  Physical Exam  General:  overweight but generally well appearing  Head:  normocephalic, atraumatic, and no abnormalities observed.   Eyes:  vision grossly intact, pupils equal, pupils round, and pupils reactive to light.  no conjunctival pallor, injection or icterus  Neck:  supple with full rom and no masses or thyromegally, no JVD or carotid bruit  Chest Wall:  No deformities, masses, or tenderness noted. Lungs:  Normal respiratory effort, chest expands symmetrically. Lungs are clear  to auscultation, no crackles or wheezes. Heart:  Normal rate and regular rhythm. S1 and S2 normal without gallop, murmur, click, rub or other extra sounds. Abdomen:  Bowel sounds positive,abdomen soft and non-tender without masses, organomegaly or hernias noted. no renal bruits  Msk:  No deformity or scoliosis noted of thoracic or lumbar spine.  poor rom both knees worse on R Pulses:  R and L carotid,radial,femoral,dorsalis pedis and posterior tibial pulses are full and equal bilaterally Extremities:  No clubbing, cyanosis,  edema, or deformity noted with normal full range of motion of all joints.   Neurologic:  sensation intact to light touch, gait normal, and DTRs symmetrical and normal.   Skin:  Intact without suspicious lesions or rashes Cervical Nodes:  No lymphadenopathy noted Inguinal Nodes:  No significant adenopathy Psych:  is seemingly sad and dysmotivated - no SI also fatigued   Diabetes Management Exam:    Foot Exam (with socks and/or shoes not present):       Sensory-Pinprick/Light touch:          Left medial foot (L-4): normal          Left dorsal foot (L-5): normal          Left lateral foot (S-1): normal          Right medial foot (L-4): normal          Right dorsal foot (L-5): normal          Right lateral foot (S-1): normal       Sensory-Monofilament:          Left foot: normal          Right foot: normal       Inspection:          Left foot: normal          Right foot: normal       Nails:          Left foot: normal          Right foot: normal   Impression & Recommendations:  Problem # 1:  DIABETES MELLITUS, TYPE II (ICD-250.00) Assessment Improved  this is imp on inc actos but pt not tolerating it GI wise will go back to 15 mg and update if not imp  watch sugars disc healthy diet (low simple sugar/ choose complex carbs/ low sat fat) diet and exercise in detail  pt not motivated due to dep will work on that and f/u 1 mo to make plan for depression Her updated medication list for this problem includes:    Glucophage 1000 Mg Tabs (Metformin hcl) .Marland Kitchen... Take one by mouth two times a day    Glucotrol Xl 10 Mg Tb24 (Glipizide) .Marland Kitchen... Take one by mouth daily    Benicar Hct 40-25 Mg Tabs (Olmesartan medoxomil-hctz) .Marland Kitchen... Take one by mouth daily    Adult Aspirin Low Strength 81 Mg Tbdp (Aspirin) .Marland Kitchen... Take one by mouth daily    Actos 30 Mg Tabs (Pioglitazone hcl) .Marland Kitchen... 1/2  by mouth once daily  Labs Reviewed: Creat: 0.8 (04/07/2010)     Last Eye Exam: normal (08/03/2008) Reviewed  HgBA1c results: 7.3 (04/07/2010)  7.6 (12/31/2009)  Orders: Prescription Created Electronically [id number])  Problem # 2:  DEPRESSION (ICD-311) Assessment: Deteriorated  worse lately with dismotivation/ fatigue/sleepiness/ resentment/ anger gets counseling through her church -- enc her to continue that  add wellbutrin - asked to update if any side eff incl worse dep  may be able to dec/wean  prozac later  spent 25 minutes face to face time with pt , over 50% of which was spent on counseling and coordination of care   Her updated medication list for this problem includes:    Prozac 40 Mg Caps (Fluoxetine hcl) .Marland Kitchen... Take one by mouth daily    Wellbutrin Xl 150 Mg Xr24h-tab (Bupropion hcl) .Marland Kitchen... 1 by mouth once daily in am  Orders: Prescription Created Electronically [phone])  Problem # 3:  HYPERTENSION (ICD-401.9) Assessment: Deteriorated  bp up a bit today- but she is somewhat distressed  will re check at 1 mo f/u Her updated medication list for this problem includes:    Benicar Hct 40-25 Mg Tabs (Olmesartan medoxomil-hctz) .Marland Kitchen... Take one by mouth daily  BP today: 148/80 Prior BP: 130/72 (01/05/2010)  Labs Reviewed: K+: 4.0 (04/07/2010) Creat: : 0.8 (04/07/2010)   Chol: 151 (12/31/2009)   HDL: 49.30 (12/31/2009)   LDL: 64 (12/31/2009)   TG: 190.0 (12/31/2009)  Orders: Prescription Created Electronically [phone])  Problem # 4:  KNEE PAIN (UJW-119.14) Assessment: New  now causing gait disorder and limitation with mobility suspect OA ? ref to Dr Patsy Lager Her updated medication list for this problem includes:    Adult Aspirin Low Strength 81 Mg Tbdp (Aspirin) .Marland Kitchen... Take one by mouth daily    Flexeril 10 Mg Tabs (Cyclobenzaprine hcl) .Marland Kitchen... 1/2 to 1 by mouth three times a day as needed neck pain  Orders: Prescription Created Electronically [id number])  Complete Medication List: 1)  Glucophage 1000 Mg Tabs (Metformin hcl) .... Take one by mouth two times a day 2)  Glucotrol Xl 10 Mg  Tb24 (Glipizide) .... Take one by mouth daily 3)  Prozac 40 Mg Caps (Fluoxetine hcl) .... Take one by mouth daily 4)  Zocor 20 Mg Tabs (Simvastatin) .Marland Kitchen.. 1 by mouth once daily 5)  Benicar Hct 40-25 Mg Tabs (Olmesartan medoxomil-hctz) .... Take one by mouth daily 6)  Adult Aspirin Low Strength 81 Mg Tbdp (Aspirin) .... Take one by mouth daily 7)  Flexeril 10 Mg Tabs (Cyclobenzaprine hcl) .... 1/2 to 1 by mouth three times a day as needed neck pain 8)  Actos 30 Mg Tabs (Pioglitazone hcl) .... 1/2  by mouth once daily 9)  Wellbutrin Xl 150 Mg Xr24h-tab (Bupropion hcl) .Marland Kitchen.. 1 by mouth once daily in am  Patient Instructions: 1)  please schedule appt with Dr Patsy Lager for knee and ankle pain and instability  2)  please start wellbutrin - to see if this helps with depression  3)  cut actos in 1/2 and go back to 15 mg daily 4)  if your gas and heartburn do not improve within 10 days- call and let me know 5)  follow up with me in 1 month Prescriptions: WELLBUTRIN XL 150 MG XR24H-TAB (BUPROPION HCL) 1 by mouth once daily in am  #30 x 11   Entered and Authorized by:   Judith Part MD   Signed by:   Judith Part MD on 04/10/2010   Method used:   Electronically to        [facility]. [address] [phone]* (retail)       [address] [place], [place]  [address]       Ph: [phone]       Fax: [phone]   RxID:   [id number]    Orders Added: 1)  Prescription Created Electronically [[id number]] 2)  Est. Patient Level IV [56433]    Current  Allergies (reviewed today): ! FOSAMAX

## 2010-05-11 ENCOUNTER — Encounter: Payer: Self-pay | Admitting: Family Medicine

## 2010-05-11 ENCOUNTER — Ambulatory Visit (INDEPENDENT_AMBULATORY_CARE_PROVIDER_SITE_OTHER): Payer: Medicare Other | Admitting: Family Medicine

## 2010-05-11 DIAGNOSIS — F3289 Other specified depressive episodes: Secondary | ICD-10-CM

## 2010-05-11 DIAGNOSIS — E119 Type 2 diabetes mellitus without complications: Secondary | ICD-10-CM

## 2010-05-11 DIAGNOSIS — F329 Major depressive disorder, single episode, unspecified: Secondary | ICD-10-CM

## 2010-05-11 DIAGNOSIS — I10 Essential (primary) hypertension: Secondary | ICD-10-CM

## 2010-05-11 DIAGNOSIS — E785 Hyperlipidemia, unspecified: Secondary | ICD-10-CM

## 2010-05-11 LAB — HM DIABETES FOOT EXAM

## 2010-05-21 NOTE — Assessment & Plan Note (Signed)
Summary: 1 MTH FOLLOW-UP  Medications Added WELLBUTRIN XL 300 MG XR24H-TAB (BUPROPION HCL) 1 by mouth once daily       Nurse Visit   Vital Signs:  Patient profile:   75 year old female Height:      63 inches Weight:      190.50 pounds BMI:     33.87 Temp:     98.6 degrees F oral Pulse rate:   80 / minute Pulse rhythm:   regular BP sitting:   132 / 60  (left arm) Cuff size:   large  Vitals Entered By: Lewanda Rife LPN (May 11, 2010 9:03 AM)  History of Present Illness: here for f/u of depression and DM and HTN  wt is stable bmi is 33   bp is 132/60 better than last time  did start wellbutrin last visit and enc counseling  thinks she is a lot better  not shaky and nervous  less impulsive and irritable  motivation is just a bit better  feels a little joy now and then  has gone to church for counseling  no side eff at all  is interested in inc dose   gas and heartburn are a lot better   actos was cut back to 15 due to intol of 30 is eating better overall  is checking sugars at home -- 120-130 in am and pm       Impression & Recommendations:  Problem # 1:  DEPRESSION (ICD-311) Assessment Improved  this is much imp with addn of wellbutrin xl 150 and counseling will adv dose to 300 at pt request -- update if side eff enc exercise as tol  enc to continue counseling f/u 3 mo  Her updated medication list for this problem includes:    Prozac 40 Mg Caps (Fluoxetine hcl) .Marland Kitchen... Take one by mouth daily    Wellbutrin Xl 300 Mg Xr24h-tab (Bupropion hcl) .Marland Kitchen... 1 by mouth once daily  Orders: Prescription Created Electronically [phone])  Problem # 2:  HYPERLIPIDEMIA (ICD-272.4) Assessment: Unchanged well controlled on statin and diet  recheck 3 mo and f/u Her updated medication list for this problem includes:    Zocor 20 Mg Tabs (Simvastatin) .Marland Kitchen... 1 by mouth once daily  Labs Reviewed: SGOT: 21 (04/07/2010)   SGPT: 16 (04/07/2010)   HDL:49.30 (12/31/2009),  45.00 (09/19/2009)  LDL:64 (12/31/2009), 63 (08/01/2008)  Chol:151 (12/31/2009), 177 (09/19/2009)  Trig:190.0 (12/31/2009), 239.0 (09/19/2009)  Problem # 3:  HYPERTENSION (ICD-401.9) Assessment: Improved  bp is improved with less stress rxn no change in med  lab and f/u 3 mo  Her updated medication list for this problem includes:    Benicar Hct 40-25 Mg Tabs (Olmesartan medoxomil-hctz) .Marland Kitchen... Take one by mouth daily  BP today: 132/60 Prior BP: 148/80 (04/10/2010)  Labs Reviewed: K+: 4.0 (04/07/2010) Creat: : 0.8 (04/07/2010)   Chol: 151 (12/31/2009)   HDL: 49.30 (12/31/2009)   LDL: 64 (12/31/2009)   TG: 190.0 (12/31/2009)  Problem # 4:  DIABETES MELLITUS, TYPE II (ICD-250.00) Assessment: Improved  sugars are improved at all with better diet (motivation ) lab in 3 mo and then f/u  urged to continue DM diet with smaller portions Her updated medication list for this problem includes:    Glucophage 1000 Mg Tabs (Metformin hcl) .Marland Kitchen... Take one by mouth two times a day    Glucotrol Xl 10 Mg Tb24 (Glipizide) .Marland Kitchen... Take one by mouth daily    Benicar Hct 40-25 Mg Tabs (Olmesartan medoxomil-hctz) .Marland Kitchen... Take one  by mouth daily    Adult Aspirin Low Strength 81 Mg Tbdp (Aspirin) .Marland Kitchen... Take one by mouth daily    Actos 15 Mg Tabs (Pioglitazone hcl) .Marland Kitchen... Take one by mouth daily  Labs Reviewed: Creat: 0.8 (04/07/2010)     Last Eye Exam: normal (08/03/2008) Reviewed HgBA1c results: 7.3 (04/07/2010)  7.6 (12/31/2009)  Orders: Prescription Created Electronically [phone])  Complete Medication List: 1)  Glucophage 1000 Mg Tabs (Metformin hcl) .... Take one by mouth two times a day 2)  Glucotrol Xl 10 Mg Tb24 (Glipizide) .... Take one by mouth daily 3)  Prozac 40 Mg Caps (Fluoxetine hcl) .... Take one by mouth daily 4)  Zocor 20 Mg Tabs (Simvastatin) .Marland Kitchen.. 1 by mouth once daily 5)  Benicar Hct 40-25 Mg Tabs (Olmesartan medoxomil-hctz) .... Take one by mouth daily 6)  Adult Aspirin Low Strength  81 Mg Tbdp (Aspirin) .... Take one by mouth daily 7)  Actos 15 Mg Tabs (Pioglitazone hcl) .... Take one by mouth daily 8)  Wellbutrin Xl 300 Mg Xr24h-tab (Bupropion hcl) .Marland Kitchen.. 1 by mouth once daily   Physical Exam  General:  overweight but generally well appearing  affect is brighter  Head:  normocephalic, atraumatic, and no abnormalities observed.   Eyes:  vision grossly intact, pupils equal, pupils round, and pupils reactive to light.   Mouth:  pharynx pink and moist.   Neck:  supple with full rom and no masses or thyromegally, no JVD or carotid bruit  Chest Wall:  No deformities, masses, or tenderness noted. Lungs:  Normal respiratory effort, chest expands symmetrically. Lungs are clear to auscultation, no crackles or wheezes. Heart:  Normal rate and regular rhythm. S1 and S2 normal without gallop, murmur, click, rub or other extra sounds. Abdomen:  Bowel sounds positive,abdomen soft and non-tender without masses, organomegaly or hernias noted. no renal bruits  Msk:  poor rom knees Pulses:  R and L carotid,radial,femoral,dorsalis pedis and posterior tibial pulses are full and equal bilaterally Extremities:  No clubbing, cyanosis, edema, or deformity noted with normal full range of motion of all joints.   Neurologic:  sensation intact to light touch, gait normal, and DTRs symmetrical and normal.   Skin:  Intact without suspicious lesions or rashes Cervical Nodes:  No lymphadenopathy noted Psych:  more cheerful and animated today good eye contact   Diabetes Management Exam:    Foot Exam (with socks and/or shoes not present):       Sensory-Pinprick/Light touch:          Left medial foot (L-4): normal          Left dorsal foot (L-5): normal          Left lateral foot (S-1): normal          Right medial foot (L-4): normal          Right dorsal foot (L-5): normal          Right lateral foot (S-1): normal       Sensory-Monofilament:          Left foot: normal          Right foot:  normal       Inspection:          Left foot: normal          Right foot: normal       Nails:          Left foot: normal          Right  foot: normal   Past History:  Past Surgical History: Last updated: 06/21/2007 Cholecystectomy ABI's- normal (10/1999) S/ p fall- ankle surgery 92005) ORIF left medial malleblus fracture Adenosine cardiolite- neg EF 77% (11/2003) Carotid dopplers-minimal plaque (05/2005) dexa 08/30/22 OP  Family History: Last updated: July 07, 2008 Father: deceased- bronchitis, asthma Mother: deceased- DM, HTN Siblings: 2 brothers with asthma, 1 sister with HTN MGM breast cancer, DM sister has lung cancer- deceased 2022/08/30 brother copd   Social History: Last updated: 01/23/2008 Marital Status: Married Children: 7  Never Smoked  Risk Factors: Smoking Status: never (05/15/2007)  Past Medical History: Depression Diabetes mellitus, type II Hypertension Osteoarthritis Hyperlipidemia osteoporosis obesity  opthy- Dr Clayborne Dana  counseling - pastoral   Review of Systems General:  Denies fatigue, loss of appetite, and malaise. Eyes:  Denies blurring and eye irritation. CV:  Denies chest pain or discomfort, palpitations, shortness of breath with exertion, and swelling of feet. Resp:  Denies cough and shortness of breath. GI:  Denies abdominal pain, change in bowel habits, gas, indigestion, nausea, and vomiting. GU:  Denies urinary frequency. MS:  Complains of joint pain and stiffness; denies muscle aches and cramps. Derm:  Denies itching, lesion(s), poor wound healing, and rash. Neuro:  Denies headaches, numbness, and tingling. Psych:  Complains of depression; denies easily tearful, irritability, panic attacks, and sense of great danger; overall better. Endo:  Denies cold intolerance, excessive thirst, excessive urination, and heat intolerance. Heme:  Denies abnormal bruising and bleeding.   Patient Instructions: 1)  increase your wellbutrin from 150 to 300 mg  once daily 2)  if any problems or side effects please let me know  3)  keep working on healthy diet and exercise  4)  I'm glad you are doing better  5)  schedule labs fasting in 3 months and then follow up 6)  lipid/ast/alt/renal / AIC   250.0 and 401.1 and 272   CC: one month f/u   Allergies: 1)  ! Fosamax  Orders Added: 1)  Est. Patient Level IV [16109] 2)  Prescription Created Electronically [id number] Prescriptions: WELLBUTRIN XL 300 MG XR24H-TAB (BUPROPION HCL) 1 by mouth once daily  #90 x 3   Entered and Authorized by:   Judith Part MD   Signed by:   Judith Part MD on 05/11/2010   Method used:   Electronically to        [facility]. [address] [phone]* (retail)       [address] [place], [place]  [address]       Ph: [phone]       Fax: [phone]   RxID:   [id number] ACTOS 15 MG TABS (PIOGLITAZONE HCL) take one by mouth daily  #90 x 3   Entered and Authorized by:   Judith Part MD   Signed by:   Judith Part MD on 05/11/2010   Method used:   Electronically to        [facility]. [address] [phone]* (retail)       [address] [place], [place]  [address]       Ph: [phone]       Fax: [phone]   RxID:   [id number]   Current Allergies (reviewed today): ! FOSAMAX

## 2010-06-13 ENCOUNTER — Encounter: Payer: Self-pay | Admitting: Family Medicine

## 2010-08-04 ENCOUNTER — Other Ambulatory Visit: Payer: Self-pay | Admitting: Family Medicine

## 2010-08-04 DIAGNOSIS — E7800 Pure hypercholesterolemia, unspecified: Secondary | ICD-10-CM

## 2010-08-04 DIAGNOSIS — I10 Essential (primary) hypertension: Secondary | ICD-10-CM

## 2010-08-05 ENCOUNTER — Other Ambulatory Visit (INDEPENDENT_AMBULATORY_CARE_PROVIDER_SITE_OTHER): Payer: Medicare Other | Admitting: Family Medicine

## 2010-08-05 DIAGNOSIS — E7800 Pure hypercholesterolemia, unspecified: Secondary | ICD-10-CM

## 2010-08-05 DIAGNOSIS — I10 Essential (primary) hypertension: Secondary | ICD-10-CM

## 2010-08-05 DIAGNOSIS — E119 Type 2 diabetes mellitus without complications: Secondary | ICD-10-CM

## 2010-08-05 LAB — RENAL FUNCTION PANEL
CO2: 30 mEq/L (ref 19–32)
Calcium: 10 mg/dL (ref 8.4–10.5)
Chloride: 102 mEq/L (ref 96–112)
Potassium: 3.9 mEq/L (ref 3.5–5.1)
Sodium: 141 mEq/L (ref 135–145)

## 2010-08-05 LAB — LIPID PANEL
Cholesterol: 147 mg/dL (ref 0–200)
HDL: 52.2 mg/dL (ref >39.00)
LDL Cholesterol: 65 mg/dL (ref 0–99)

## 2010-08-06 LAB — HEMOGLOBIN A1C: Hgb A1c MFr Bld: 6.9 % — ABNORMAL HIGH (ref 4.6–6.5)

## 2010-08-10 ENCOUNTER — Encounter: Payer: Self-pay | Admitting: Family Medicine

## 2010-08-10 ENCOUNTER — Ambulatory Visit (INDEPENDENT_AMBULATORY_CARE_PROVIDER_SITE_OTHER): Payer: Medicare Other | Admitting: Family Medicine

## 2010-08-10 DIAGNOSIS — F329 Major depressive disorder, single episode, unspecified: Secondary | ICD-10-CM

## 2010-08-10 DIAGNOSIS — I10 Essential (primary) hypertension: Secondary | ICD-10-CM

## 2010-08-10 DIAGNOSIS — E785 Hyperlipidemia, unspecified: Secondary | ICD-10-CM

## 2010-08-10 DIAGNOSIS — E119 Type 2 diabetes mellitus without complications: Secondary | ICD-10-CM

## 2010-08-10 NOTE — Assessment & Plan Note (Signed)
This is stable - well controlled with statin and diet  Rev low sat fat diet- doing well with that  Lab and f/u planned for 6 mo

## 2010-08-10 NOTE — Assessment & Plan Note (Signed)
Stable (better on 2nd check today) Urged to stay active and keep working on weight loss F/u 6 mo after labs

## 2010-08-10 NOTE — Progress Notes (Signed)
Subjective:    Patient ID: Amanda Benson, female    DOB: [birth year], 75 y.o.   MRN: [id number]  HPI Here for f/u of DM and lipids and HTN   Wt is down 6 lb Is working on that  Drinking more water   HTN is fair with 144/74 today- up a bit  On benicar hct  No ha or cp or edema    Lipids are well controlled with statin and diet  HDL is 52 and LDL is 64 Lab Results  Component Value Date   CHOL 147 08/05/2010   CHOL 151 12/31/2009   CHOL 177 09/19/2009   Lab Results  Component Value Date   HDL 52.20 08/05/2010   HDL 49.30 12/31/2009   HDL 40.98 09/19/2009   Lab Results  Component Value Date   LDLCALC 65 08/05/2010   LDLCALC 64 12/31/2009   LDLCALC 63 08/01/2008   Lab Results  Component Value Date   TRIG 150.0* 08/05/2010   TRIG 190.0* 12/31/2009   TRIG 239.0* 09/19/2009   Lab Results  Component Value Date   CHOLHDL 3 08/05/2010   CHOLHDL 3 12/31/2009   CHOLHDL 4 09/19/2009   Lab Results  Component Value Date   LDLDIRECT 107.2 09/19/2009     DM is improved  a1c is 6.9 down from 7.3 Is checking sugars at home and thinks they are good -  A few high ones but for the most part 130s or below in am and 150s or below in afternoon  Is eating better too  Also lost a little wt  On metformin and actos  DM eye exam -- last may - is due for that - will make her own appt   occ gets dizzy  --when she is walking -- is a little light headed - stops a second and then fine No falls - knows to sit down when it gets bad  Thinks this may be age related   Mood is better too - that is good too  Is ready for a good summer Opened her pool   Past Medical History  Diagnosis Date  . Depression   . Diabetes mellitus     type II  . Hypertension   . Osteoporosis   . Hyperlipidemia   . Obesity     History   Social History  . Marital Status: Married    Spouse Name: N/A    Number of Children: 7  . Years of Education: N/A   Occupational History  .     Social History Main Topics  .  Smoking status: Never Smoker   . Smokeless tobacco: Not on file  . Alcohol Use: Not on file  . Drug Use: Not on file  . Sexually Active: Not on file   Other Topics Concern  . Not on file   Social History Narrative  . No narrative on file    Review of Systems Review of Systems  Constitutional: Negative for fever, appetite change, fatigue and unexpected weight change.  Eyes: Negative for pain and visual disturbance.  Respiratory: Negative for cough and shortness of breath.   Cardiovascular: Negative.   Gastrointestinal: Negative for nausea, diarrhea and constipation.  Genitourinary: Negative for urgency and frequency.  Skin: Negative for pallor.  Neurological: Negative for weakness, light-headedness, numbness and headaches.  Hematological: Negative for adenopathy. Does not bruise/bleed easily.  Psychiatric/Behavioral: Negative for dysphoric mood at this time (improved). The patient is not nervous/anxious.  Objective:   Physical Exam  Constitutional: She appears well-developed and well-nourished. No distress.  HENT:  Head: Normocephalic and atraumatic.  Mouth/Throat: Oropharynx is clear and moist.  Eyes: Conjunctivae and EOM are normal. Pupils are equal, round, and reactive to light.  Neck: Normal range of motion. Neck supple. No JVD present. No thyromegaly present.  Cardiovascular: Normal rate, regular rhythm and normal heart sounds.   Pulmonary/Chest: Effort normal and breath sounds normal. No respiratory distress. She has no wheezes.  Abdominal: Soft. Bowel sounds are normal. She exhibits no distension and no mass. There is no rebound.  Musculoskeletal: She exhibits no edema and no tenderness.  Lymphadenopathy:    She has no cervical adenopathy.  Neurological: She is alert. She has normal reflexes. Coordination normal.  Skin: Skin is warm and dry. No rash noted. No erythema. No pallor.  Psychiatric: She has a normal mood and affect.       Improved affect! Animated  and much less depressed Better eye contact           Assessment & Plan:

## 2010-08-10 NOTE — Assessment & Plan Note (Signed)
Improved with wt loss and better diet/ more activity Will make own opthy exam appt F/u after labs 6 mo  Rev low glycemic diet Enc further wt loss

## 2010-08-10 NOTE — Patient Instructions (Addendum)
Keep working on healthy diet and exercise Diabetes is improving  Other labs are stable No change in medicines Schedule fasting labs and then follow up in 6 months  Don't forget to make your yearly eye doctor appt

## 2010-08-10 NOTE — Assessment & Plan Note (Signed)
Quite a bit improved today with wellbutrin and prozac  Glad to see it  Pt is more motivated as well

## 2010-12-23 ENCOUNTER — Encounter: Payer: Self-pay | Admitting: Family Medicine

## 2010-12-23 ENCOUNTER — Ambulatory Visit (INDEPENDENT_AMBULATORY_CARE_PROVIDER_SITE_OTHER): Payer: Medicare Other | Admitting: Family Medicine

## 2010-12-23 VITALS — BP 140/72 | HR 84 | Temp 98.7°F | Ht 63.0 in | Wt 179.5 lb

## 2010-12-23 DIAGNOSIS — N390 Urinary tract infection, site not specified: Secondary | ICD-10-CM | POA: Insufficient documentation

## 2010-12-23 DIAGNOSIS — E119 Type 2 diabetes mellitus without complications: Secondary | ICD-10-CM

## 2010-12-23 DIAGNOSIS — R350 Frequency of micturition: Secondary | ICD-10-CM

## 2010-12-23 LAB — POCT URINALYSIS DIPSTICK
Bilirubin, UA: NEGATIVE
Glucose, UA: NEGATIVE
Nitrite, UA: POSITIVE

## 2010-12-23 LAB — POCT UA - MICROSCOPIC ONLY
Crystals, Ur, HPF, POC: 0
Epithelial cells, urine per micros: 1

## 2010-12-23 MED ORDER — CIPROFLOXACIN HCL 250 MG PO TABS: 250.0000 mg | ORAL_TABLET | Freq: Two times a day (BID) | ORAL | Status: AC

## 2010-12-23 MED ORDER — ONETOUCH ULTRA SYSTEM W/DEVICE KIT: PACK | Status: DC

## 2010-12-23 NOTE — Assessment & Plan Note (Signed)
tx with cipro for uti  Uncomplicated but has had it for 3 weeks Adv fluids and update if worse or not imp

## 2010-12-23 NOTE — Progress Notes (Signed)
Subjective:    Patient ID: Amanda Benson, female    DOB: 27-Jul-1935, 75 y.o.   MRN: [id number]  HPI Here for uti- ua today is pos  Symptoms for 3 weeks -- urinating frequently and it hurts some times  Urine has a bad odor to it  No blood  Sine back pain - but that was related to a fall off a barstool- slip and fall and is improving   No fever or n/v   No vaginal itching or other symptoms   Needs new glucose meter also -- needs px today  Patient Active Problem List  Diagnoses  . DIABETES MELLITUS, TYPE II  . HYPERLIPIDEMIA  . GRIEF REACTION  . DEPRESSION  . HYPERTENSION  . OSTEOARTHRITIS  . OSTEOPOROSIS  . KNEE PAIN  . UTI (lower urinary tract infection)   Past Medical History  Diagnosis Date  . Depression   . Diabetes mellitus     type II  . Hypertension   . Osteoporosis   . Hyperlipidemia   . Obesity    Past Surgical History  Procedure Date  . Cholecystectomy   . Ankle surgery     left medial malleblus fracture   History  Substance Use Topics  . Smoking status: Never Smoker   . Smokeless tobacco: Not on file  . Alcohol Use: Not on file   Family History  Problem Relation Age of Onset  . Diabetes Mother   . Hypertension Mother   . Asthma Father   . Hypertension Sister   . Cancer Sister     lung  . Asthma Brother   . COPD Brother   . Cancer Maternal Grandmother     breast   . Asthma Brother    Allergies  Allergen Reactions  . Alendronate Sodium     REACTION: heartburn   Current Outpatient Prescriptions on File Prior to Visit  Medication Sig Dispense Refill  . aspirin 81 MG EC tablet Take 81 mg by mouth daily.        Marland Kitchen buPROPion (WELLBUTRIN XL) 300 MG 24 hr tablet Take 300 mg by mouth daily.        Marland Kitchen FLUoxetine (PROZAC) 40 MG capsule Take 40 mg by mouth daily.        Marland Kitchen glipiZIDE (GLUCOTROL) 10 MG 24 hr tablet Take 10 mg by mouth daily.        . metFORMIN (GLUCOPHAGE) 1000 MG tablet Take 1,000 mg by mouth 2 (two) times daily.        Marland Kitchen  olmesartan-hydrochlorothiazide (BENICAR HCT) 40-25 MG per tablet Take 1 tablet by mouth daily.        . pioglitazone (ACTOS) 15 MG tablet Take 15 mg by mouth daily.        . simvastatin (ZOCOR) 20 MG tablet Take 20 mg by mouth daily.            Review of Systems Review of Systems  Constitutional: Negative for fever, appetite change, fatigue and unexpected weight change.  Eyes: Negative for pain and visual disturbance.  Respiratory: Negative for cough and shortness of breath.   Cardiovascular: Negative for cp or palpitations    Gastrointestinal: Negative for nausea, diarrhea and constipation.  Genitourinary: Negative for hematuria / side pain, pos for frequency and dysuria .  Skin: Negative for pallor or rash   Neurological: Negative for weakness, light-headedness, numbness and headaches.  Hematological: Negative for adenopathy. Does not bruise/bleed easily.  Psychiatric/Behavioral: Negative for dysphoric mood. The patient is not  nervous/anxious.          Objective:   Physical Exam  Constitutional: She appears well-developed and well-nourished. No distress.       overwt and well appearing   HENT:  Head: Normocephalic and atraumatic.  Mouth/Throat: Oropharynx is clear and moist.  Eyes: Conjunctivae and EOM are normal. Pupils are equal, round, and reactive to light.  Neck: Normal range of motion. Neck supple.  Cardiovascular: Normal rate, regular rhythm and normal heart sounds.   Pulmonary/Chest: Breath sounds normal. No respiratory distress. She has no wheezes.  Abdominal: Soft. Bowel sounds are normal. She exhibits no distension and no mass. There is tenderness. There is no guarding.       Mild suprapubic tenderness   Musculoskeletal: She exhibits no tenderness.       No cva tenderness  Lymphadenopathy:    She has no cervical adenopathy.  Skin: Skin is warm and dry. No rash noted. No erythema. No pallor.  Psychiatric: She has a normal mood and affect.          Assessment  & Plan:

## 2010-12-23 NOTE — Assessment & Plan Note (Signed)
Needed px for new meter today  That was done

## 2010-12-23 NOTE — Patient Instructions (Signed)
Drink lots of water  Update me if worse or if fever or nausea  Update me if not improved in 4-5 days Take cipro as directed

## 2011-01-15 ENCOUNTER — Other Ambulatory Visit: Payer: Self-pay | Admitting: Family Medicine

## 2011-01-29 ENCOUNTER — Other Ambulatory Visit: Payer: Self-pay | Admitting: Family Medicine

## 2011-02-02 ENCOUNTER — Other Ambulatory Visit: Payer: Self-pay | Admitting: Family Medicine

## 2011-02-02 NOTE — Telephone Encounter (Signed)
[facility] request refill Metformin 1000mg  #180 x 0 pt already scheduled appt 02/10/11.

## 2011-02-04 ENCOUNTER — Other Ambulatory Visit (INDEPENDENT_AMBULATORY_CARE_PROVIDER_SITE_OTHER): Payer: Medicare Other

## 2011-02-04 DIAGNOSIS — I10 Essential (primary) hypertension: Secondary | ICD-10-CM

## 2011-02-04 DIAGNOSIS — E785 Hyperlipidemia, unspecified: Secondary | ICD-10-CM

## 2011-02-04 DIAGNOSIS — E119 Type 2 diabetes mellitus without complications: Secondary | ICD-10-CM

## 2011-02-04 LAB — LIPID PANEL
LDL Cholesterol: 47 mg/dL (ref 0–99)
Total CHOL/HDL Ratio: 2

## 2011-02-04 LAB — COMPREHENSIVE METABOLIC PANEL
ALT: 17 U/L (ref 0–35)
AST: 21 U/L (ref 0–37)
Albumin: 3.7 g/dL (ref 3.5–5.2)
Calcium: 9.9 mg/dL (ref 8.4–10.5)
Chloride: 105 mEq/L (ref 96–112)
Creatinine, Ser: 0.9 mg/dL (ref 0.4–1.2)
Potassium: 3.6 mEq/L (ref 3.5–5.1)

## 2011-02-10 ENCOUNTER — Ambulatory Visit (INDEPENDENT_AMBULATORY_CARE_PROVIDER_SITE_OTHER): Payer: Medicare Other | Admitting: Family Medicine

## 2011-02-10 ENCOUNTER — Encounter: Payer: Self-pay | Admitting: Family Medicine

## 2011-02-10 ENCOUNTER — Other Ambulatory Visit: Payer: Self-pay | Admitting: Family Medicine

## 2011-02-10 VITALS — BP 142/66 | HR 80 | Temp 99.0°F | Ht 63.0 in | Wt 177.2 lb

## 2011-02-10 DIAGNOSIS — I10 Essential (primary) hypertension: Secondary | ICD-10-CM

## 2011-02-10 DIAGNOSIS — E785 Hyperlipidemia, unspecified: Secondary | ICD-10-CM

## 2011-02-10 DIAGNOSIS — E119 Type 2 diabetes mellitus without complications: Secondary | ICD-10-CM

## 2011-02-10 DIAGNOSIS — J069 Acute upper respiratory infection, unspecified: Secondary | ICD-10-CM

## 2011-02-10 DIAGNOSIS — R7989 Other specified abnormal findings of blood chemistry: Secondary | ICD-10-CM | POA: Insufficient documentation

## 2011-02-10 DIAGNOSIS — R6889 Other general symptoms and signs: Secondary | ICD-10-CM

## 2011-02-10 MED ORDER — ONETOUCH ULTRASOFT LANCETS MISC: Status: AC

## 2011-02-10 NOTE — Patient Instructions (Signed)
Labs today for thyroid and will update you  Keep watching diet  Lancet px sent to pharmacy For cold - rest/ fluids/ nasal saline spray and mucinex for symptoms Ask about next flu shot clinic at check out

## 2011-02-10 NOTE — Progress Notes (Signed)
Subjective:    Patient ID: Amanda Benson, female    DOB: 1935/06/15, 75 y.o.   MRN: [id number]  HPI Here to f/u for HTN/ DM / lipids/ abn tsh and new uri symptoms  Some sniffles  99 temp Some post nasal drip  Lasting a week- not severe  Wants to get a flu shot    bp 142/66- not too bad , watches at home - is good overall 140 or below  No cp, ha or edema   Wt down 2 lb with bmi of 31 Is trying to watch diet  Am sugar is 80s to low 100s  Pm after meal 80s- 160s -- probable avg is 120- 130s - is trying really hard!  DM Lab Results  Component Value Date   HGBA1C 6.9* 08/05/2010   diet-good  On metformins and actos Checks sugar twice daily  Is due for her eye appt - has appt set up (missed it in may) -- sees Dr Clayborne Dana   Lipids  Lab Results  Component Value Date   CHOL 128 02/04/2011   HDL 59.40 02/04/2011   LDLCALC 47 02/04/2011   LDLDIRECT 107.2 09/19/2009   TRIG 109.0 02/04/2011   CHOLHDL 2 02/04/2011     On zocor --no problems with it  Eats low fat diet   tsh abn Lab Results  Component Value Date   TSH 6.08* 02/04/2011   is fatigued/ dry skin, but no hair loss  No wt gain  No goiter  Patient Active Problem List  Diagnoses  . DIABETES MELLITUS, TYPE II  . HYPERLIPIDEMIA  . GRIEF REACTION  . DEPRESSION  . HYPERTENSION  . OSTEOARTHRITIS  . OSTEOPOROSIS  . KNEE PAIN  . UTI (lower urinary tract infection)  . Abnormal TSH  . Viral URI   Past Medical History  Diagnosis Date  . Depression   . Diabetes mellitus     type II  . Hypertension   . Osteoporosis   . Hyperlipidemia   . Obesity    Past Surgical History  Procedure Date  . Cholecystectomy   . Ankle surgery     left medial malleblus fracture   History  Substance Use Topics  . Smoking status: Never Smoker   . Smokeless tobacco: Not on file  . Alcohol Use: Not on file   Family History  Problem Relation Age of Onset  . Diabetes Mother   . Hypertension Mother   . Asthma Father   .  Hypertension Sister   . Cancer Sister     lung  . Asthma Brother   . COPD Brother   . Cancer Maternal Grandmother     breast   . Asthma Brother    Allergies  Allergen Reactions  . Alendronate Sodium     REACTION: heartburn   Current Outpatient Prescriptions on File Prior to Visit  Medication Sig Dispense Refill  . aspirin 81 MG EC tablet Take 81 mg by mouth daily.        Marland Kitchen BENICAR HCT 40-25 MG per tablet take 1 tablet by mouth once daily  90 tablet  2  . Blood Glucose Monitoring Suppl (ONE TOUCH ULTRA SYSTEM KIT) W/DEVICE KIT To check sugar daily and as needed for DM2 250.0   1 each  0  . buPROPion (WELLBUTRIN XL) 300 MG 24 hr tablet Take 300 mg by mouth daily.        Marland Kitchen FLUoxetine (PROZAC) 40 MG capsule Take 40 mg by mouth daily.        Marland Kitchen  metFORMIN (GLUCOPHAGE) 1000 MG tablet take 1 tablet by mouth twice a day  180 tablet  0  . pioglitazone (ACTOS) 15 MG tablet Take 15 mg by mouth daily.        . simvastatin (ZOCOR) 20 MG tablet take 1 tablet by mouth once daily  90 tablet  3    Review of Systems Review of Systems  Constitutional: Negative for fever, appetite change, fatigue and unexpected weight change.  Eyes: Negative for pain and visual disturbance.  ENT pos for runny nose and mild st, no sinus pain or tenderness Respiratory: Negative for cough and shortness of breath.   Cardiovascular: Negative for cp or palpitations    Gastrointestinal: Negative for nausea, diarrhea and constipation.  Genitourinary: Negative for urgency and frequency. no excessive thirst  Skin: Negative for pallor or rash   Neurological: Negative for weakness, light-headedness, numbness and headaches.  Hematological: Negative for adenopathy. Does not bruise/bleed easily.  Psychiatric/Behavioral: Negative for dysphoric mood. The patient is not nervous/anxious.          Objective:   Physical Exam  Constitutional: She appears well-developed and well-nourished. No distress.  HENT:  Head: Normocephalic  and atraumatic.  Right Ear: External ear normal.  Left Ear: External ear normal.  Mouth/Throat: Oropharynx is clear and moist.       Nares are injected and congested  No sinus tenderness   Eyes: Conjunctivae and EOM are normal. Pupils are equal, round, and reactive to light. No scleral icterus.  Neck: Normal range of motion. Neck supple. No JVD present. Carotid bruit is not present. No thyromegaly present.  Cardiovascular: Normal rate, normal heart sounds and intact distal pulses.  Exam reveals no gallop.   Pulmonary/Chest: Effort normal and breath sounds normal. No respiratory distress. She has no wheezes. She exhibits no tenderness.  Abdominal: Soft. Bowel sounds are normal. She exhibits no distension and no mass. There is no tenderness.  Musculoskeletal: Normal range of motion. She exhibits no edema and no tenderness.  Lymphadenopathy:    She has no cervical adenopathy.  Neurological: She is alert. She has normal reflexes. No cranial nerve deficit. She exhibits normal muscle tone. Coordination normal.  Skin: Skin is warm and dry. No rash noted. No erythema. No pallor.  Psychiatric: She has a normal mood and affect.          Assessment & Plan:

## 2011-02-11 NOTE — Assessment & Plan Note (Signed)
Will tx symptomatically Rev this - see inst  inst to call if worse/ ha/ inc cough or fever  Will put off flu shot until feeling better

## 2011-02-11 NOTE — Assessment & Plan Note (Signed)
Fair control on zocor and diet Disc goals for lipids and reasons to control them Rev labs with pt Rev low sat fat diet in detail

## 2011-02-11 NOTE — Assessment & Plan Note (Signed)
bp in fair control at this time  No changes needed  Disc lifstyle change with low sodium diet and exercise   

## 2011-02-11 NOTE — Assessment & Plan Note (Signed)
Working hard on lifestyle Lab Results  Component Value Date   HGBA1C 6.5 02/10/2011   metformin/ glipizide/ arb Will call for her own eye exam  Disc imp of foot care

## 2011-02-11 NOTE — Assessment & Plan Note (Signed)
This is new Thyroid profile today  No clinical changes except fatigue

## 2011-02-15 ENCOUNTER — Other Ambulatory Visit: Payer: Self-pay | Admitting: Family Medicine

## 2011-02-16 NOTE — Progress Notes (Signed)
Amanda Benson- could you please help find out what happened with the thyroid profile I ordered?--thanks

## 2011-02-16 NOTE — Telephone Encounter (Signed)
Will refill electronically  

## 2011-02-18 ENCOUNTER — Ambulatory Visit (INDEPENDENT_AMBULATORY_CARE_PROVIDER_SITE_OTHER): Payer: Medicare Other

## 2011-02-18 DIAGNOSIS — Z23 Encounter for immunization: Secondary | ICD-10-CM

## 2011-03-25 ENCOUNTER — Telehealth: Payer: Self-pay | Admitting: Family Medicine

## 2011-03-25 DIAGNOSIS — R7989 Other specified abnormal findings of blood chemistry: Secondary | ICD-10-CM

## 2011-03-25 NOTE — Telephone Encounter (Signed)
Since her thyroid panel did not go through , I still need or order thyroid tests  Schedule whenever convenient I will put in future order

## 2011-03-25 NOTE — Telephone Encounter (Signed)
Message copied by Judy Pimple on Thu Mar 25, 2011 10:24 PM ------      Message from: Josph Macho A      Created: Wed Mar 24, 2011 11:42 AM       FYI-      ----- Message -----         From: Cyd Silence         Sent: 03/24/2011  10:49 AM           To: Josph Macho, CMA            It looks like it was ordered incorrectly, or that the specific test was for a lab we don't use. It said the resulting agency was [facility] which is a lab we don't use. We will have to redraw pt and doc needs to make sure [facility], [facility], or [facility] is selected.      ----- Message -----         From: Josph Macho, CMA         Sent: 03/24/2011  10:41 AM           To: Cyd Silence, [place]            Hi ladies!            Could you check on her thyroid panel? It was collected in November, but never resulted per Dr. Milinda Antis.            Thanks!

## 2011-03-26 NOTE — Telephone Encounter (Signed)
Patient notified as instructed by telephone. Pt scheduled appt 04/08/11 at 10:30 am for lab appt.

## 2011-04-08 ENCOUNTER — Other Ambulatory Visit (INDEPENDENT_AMBULATORY_CARE_PROVIDER_SITE_OTHER): Payer: Medicare Other

## 2011-04-08 DIAGNOSIS — I10 Essential (primary) hypertension: Secondary | ICD-10-CM

## 2011-04-08 DIAGNOSIS — R7989 Other specified abnormal findings of blood chemistry: Secondary | ICD-10-CM

## 2011-04-08 DIAGNOSIS — E119 Type 2 diabetes mellitus without complications: Secondary | ICD-10-CM

## 2011-04-08 DIAGNOSIS — R6889 Other general symptoms and signs: Secondary | ICD-10-CM

## 2011-04-08 LAB — T3 UPTAKE: T3 Uptake: 33.2 % (ref 22.5–37.0)

## 2011-04-08 LAB — T4, FREE: Free T4: 0.85 ng/dL (ref 0.60–1.60)

## 2011-05-10 ENCOUNTER — Other Ambulatory Visit: Payer: Self-pay | Admitting: *Deleted

## 2011-05-10 MED ORDER — PIOGLITAZONE HCL 15 MG PO TABS: 15.0000 mg | ORAL_TABLET | Freq: Every day | ORAL | Status: DC

## 2011-05-31 ENCOUNTER — Other Ambulatory Visit: Payer: Self-pay | Admitting: *Deleted

## 2011-05-31 MED ORDER — BUPROPION HCL ER (XL) 300 MG PO TB24: 300.0000 mg | ORAL_TABLET | Freq: Every day | ORAL | Status: DC

## 2011-05-31 NOTE — Telephone Encounter (Signed)
Will refill electronically  

## 2011-06-07 ENCOUNTER — Telehealth (INDEPENDENT_AMBULATORY_CARE_PROVIDER_SITE_OTHER): Payer: Medicare Other | Admitting: Family Medicine

## 2011-06-07 ENCOUNTER — Other Ambulatory Visit: Payer: Self-pay | Admitting: Family Medicine

## 2011-06-07 ENCOUNTER — Other Ambulatory Visit: Payer: Medicare Other

## 2011-06-07 DIAGNOSIS — M810 Age-related osteoporosis without current pathological fracture: Secondary | ICD-10-CM

## 2011-06-07 DIAGNOSIS — R7989 Other specified abnormal findings of blood chemistry: Secondary | ICD-10-CM

## 2011-06-07 DIAGNOSIS — E785 Hyperlipidemia, unspecified: Secondary | ICD-10-CM

## 2011-06-07 DIAGNOSIS — I10 Essential (primary) hypertension: Secondary | ICD-10-CM

## 2011-06-07 DIAGNOSIS — E119 Type 2 diabetes mellitus without complications: Secondary | ICD-10-CM

## 2011-06-07 DIAGNOSIS — R6889 Other general symptoms and signs: Secondary | ICD-10-CM

## 2011-06-07 LAB — CBC WITH DIFFERENTIAL/PLATELET
Basophils Relative: 0.3 % (ref 0.0–3.0)
Eosinophils Absolute: 0.2 10*3/uL (ref 0.0–0.7)
Hemoglobin: 12.5 g/dL (ref 12.0–15.0)
Lymphocytes Relative: 20.6 % (ref 12.0–46.0)
MCHC: 33 g/dL (ref 30.0–36.0)
MCV: 91 fl (ref 78.0–100.0)
Monocytes Absolute: 0.5 10*3/uL (ref 0.1–1.0)
Neutro Abs: 4.2 10*3/uL (ref 1.4–7.7)
RBC: 4.15 Mil/uL (ref 3.87–5.11)

## 2011-06-07 LAB — COMPREHENSIVE METABOLIC PANEL
AST: 21 U/L (ref 0–37)
BUN: 22 mg/dL (ref 6–23)
Calcium: 9.6 mg/dL (ref 8.4–10.5)
Chloride: 104 mEq/L (ref 96–112)
Creatinine, Ser: 1 mg/dL (ref 0.4–1.2)
GFR: 58.98 mL/min — ABNORMAL LOW (ref >60.00)

## 2011-06-07 LAB — TSH: TSH: 3.02 u[IU]/mL (ref 0.35–5.50)

## 2011-06-07 LAB — LIPID PANEL: HDL: 63 mg/dL (ref >39.00)

## 2011-06-07 LAB — HEMOGLOBIN A1C: Hgb A1c MFr Bld: 6.7 % — ABNORMAL HIGH (ref 4.6–6.5)

## 2011-06-07 NOTE — Telephone Encounter (Signed)
Message copied by Judy Pimple on Mon Jun 07, 2011  1:58 PM ------      Message from: Alvina Chou      Created: Mon Jun 07, 2011  9:40 AM      Regarding: lab orders for today       Fasting labs, already  have the blood. Thanks, Camelia Eng

## 2011-06-08 LAB — VITAMIN D 25 HYDROXY (VIT D DEFICIENCY, FRACTURES): Vit D, 25-Hydroxy: 73 ng/mL (ref 30–89)

## 2011-06-14 ENCOUNTER — Ambulatory Visit (INDEPENDENT_AMBULATORY_CARE_PROVIDER_SITE_OTHER): Payer: Medicare Other | Admitting: Family Medicine

## 2011-06-14 ENCOUNTER — Encounter: Payer: Self-pay | Admitting: Family Medicine

## 2011-06-14 VITALS — BP 136/60 | HR 80 | Temp 98.2°F | Ht 63.0 in | Wt 178.8 lb

## 2011-06-14 DIAGNOSIS — I10 Essential (primary) hypertension: Secondary | ICD-10-CM

## 2011-06-14 DIAGNOSIS — E119 Type 2 diabetes mellitus without complications: Secondary | ICD-10-CM

## 2011-06-14 DIAGNOSIS — E669 Obesity, unspecified: Secondary | ICD-10-CM

## 2011-06-14 DIAGNOSIS — R6889 Other general symptoms and signs: Secondary | ICD-10-CM

## 2011-06-14 DIAGNOSIS — R7989 Other specified abnormal findings of blood chemistry: Secondary | ICD-10-CM

## 2011-06-14 MED ORDER — OLMESARTAN MEDOXOMIL-HCTZ 40-25 MG PO TABS: 1.0000 | ORAL_TABLET | Freq: Every day | ORAL | Status: DC

## 2011-06-14 MED ORDER — PIOGLITAZONE HCL 15 MG PO TABS: 15.0000 mg | ORAL_TABLET | Freq: Every day | ORAL | Status: DC

## 2011-06-14 MED ORDER — GLIPIZIDE ER 10 MG PO TB24: 10.0000 mg | ORAL_TABLET | Freq: Every day | ORAL | Status: DC

## 2011-06-14 MED ORDER — METFORMIN HCL 1000 MG PO TABS: 1000.0000 mg | ORAL_TABLET | Freq: Two times a day (BID) | ORAL | Status: DC

## 2011-06-14 MED ORDER — FLUOXETINE HCL 40 MG PO CAPS: 40.0000 mg | ORAL_CAPSULE | Freq: Every day | ORAL | Status: DC

## 2011-06-14 MED ORDER — BUPROPION HCL ER (XL) 300 MG PO TB24: 300.0000 mg | ORAL_TABLET | Freq: Every day | ORAL | Status: DC

## 2011-06-14 MED ORDER — SIMVASTATIN 20 MG PO TABS: 20.0000 mg | ORAL_TABLET | Freq: Every day | ORAL | Status: DC

## 2011-06-14 NOTE — Assessment & Plan Note (Signed)
Discussed how this problem influences overall health and the risks it imposes  Reviewed plan for weight loss with lower calorie diet (via better food choices and also portion control or program like weight watchers) and exercise building up to or more than 30 minutes 5 days per week including some aerobic activity    Pt states she is aware of all this but refuses to do it

## 2011-06-14 NOTE — Patient Instructions (Addendum)
Think about making your health a priority so you can remain independent longer and feel good - and continue to take care of your family This means exercise 5 days per week (work up to that)  Also sticking to a diabetic diet  Schedule follow up in 6 months with labs prior for annual exam  Try to get 1200-1500 mg of calcium per day with at least 1000 iu of vitamin D - for bone health

## 2011-06-14 NOTE — Assessment & Plan Note (Signed)
Now normalized and not a problem Rev with pt

## 2011-06-14 NOTE — Assessment & Plan Note (Signed)
bp in fair control at this time  No changes needed  Disc lifstyle change with low sodium diet and exercise   

## 2011-06-14 NOTE — Assessment & Plan Note (Signed)
Very slt worse but still in good control on 3 oral meds Pt refuses to change diet or try exercise  Does not want to be taught about lifestyle change  Rev labs

## 2011-06-14 NOTE — Progress Notes (Signed)
Subjective:    Patient ID: Amanda Benson, female    DOB: [birth year], 76 y.o.   MRN: [id number]  HPI Here for f/u of DM and HTN and to follow tsh Feels ok in general  Some hearing loss- not ready for testing though    Had abn tsh- with f/u nl profile Lab Results  Component Value Date   TSH 3.02 06/07/2011   Feels ok   bp is 136/60     Today No cp or palpitations or headaches or edema  No side effects to medicines    Diabetes Home sugar results - has not checked at all lately because it was stable  DM diet - does eat differently over the winter -- holidays -is some better now  Not motivated to change - walks at her house in the home  Refuses to do more than that  Stress- has to take care of family members and does not want to take care of  Symptoms A1C last 6.7 up from 6.5 No problems with medications - glipizide and metformin and actos  Renal protection-on ARB Last eye exam - was may 2012 - fine  On statin-good control   Is overwt No change in wt  bmi is 31 She is not motivated to loose weight or be more active-is frank about that and does not want to be lectured about it   Patient Active Problem List  Diagnoses  . DIABETES MELLITUS, TYPE II  . HYPERLIPIDEMIA  . GRIEF REACTION  . DEPRESSION  . HYPERTENSION  . OSTEOARTHRITIS  . OSTEOPOROSIS  . KNEE PAIN  . UTI (lower urinary tract infection)  . Abnormal TSH  . Viral URI  . Obesity   Past Medical History  Diagnosis Date  . Depression   . Diabetes mellitus     type II  . Hypertension   . Osteoporosis   . Hyperlipidemia   . Obesity    Past Surgical History  Procedure Date  . Cholecystectomy   . Ankle surgery     left medial malleblus fracture   History  Substance Use Topics  . Smoking status: Never Smoker   . Smokeless tobacco: Not on file  . Alcohol Use: Not on file   Family History  Problem Relation Age of Onset  . Diabetes Mother   . Hypertension Mother   . Asthma Father   . Hypertension  Sister   . Cancer Sister     lung  . Asthma Brother   . COPD Brother   . Cancer Maternal Grandmother     breast   . Asthma Brother    Allergies  Allergen Reactions  . Alendronate Sodium     REACTION: heartburn   Current Outpatient Prescriptions on File Prior to Visit  Medication Sig Dispense Refill  . aspirin 81 MG EC tablet Take 81 mg by mouth daily.        . Blood Glucose Monitoring Suppl (ONE TOUCH ULTRA SYSTEM KIT) W/DEVICE KIT To check sugar daily and as needed for DM2 250.0   1 each  0  . Lancets (ONETOUCH ULTRASOFT) lancets To check sugar twice daily and as needed for DM2  250.00  100 each  11        Review of Systems Review of Systems  Constitutional: Negative for fever, appetite change, and unexpected weight change. pos for fatigue at times  Eyes: Negative for pain and visual disturbance.  Respiratory: Negative for cough and shortness of breath.   Cardiovascular: Negative  for cp or palpitations    Gastrointestinal: Negative for nausea, diarrhea and constipation.  Genitourinary: Negative for urgency and frequency.  Skin: Negative for pallor or rash   Neurological: Negative for weakness, light-headedness, numbness and headaches.  Hematological: Negative for adenopathy. Does not bruise/bleed easily.  Psychiatric/Behavioral: Negative for dysphoric mood. The patient is not nervous/anxious.          Objective:   Physical Exam  Constitutional: She appears well-developed and well-nourished. No distress.       Obese and well appearing   HENT:  Head: Normocephalic and atraumatic.  Mouth/Throat: Oropharynx is clear and moist.  Eyes: Conjunctivae and EOM are normal. Pupils are equal, round, and reactive to light. No scleral icterus.  Neck: Normal range of motion. Neck supple. No JVD present. Carotid bruit is not present. No thyromegaly present.  Cardiovascular: Normal rate, regular rhythm, normal heart sounds and intact distal pulses.  Exam reveals no gallop.     Pulmonary/Chest: Effort normal and breath sounds normal. No respiratory distress. She has no wheezes.  Abdominal: Soft. Bowel sounds are normal. She exhibits no distension, no abdominal bruit and no mass. There is no tenderness.  Musculoskeletal: Normal range of motion. She exhibits no edema and no tenderness.  Lymphadenopathy:    She has no cervical adenopathy.  Neurological: She is alert. She has normal reflexes. No cranial nerve deficit. She exhibits normal muscle tone. Coordination normal.  Skin: Skin is warm and dry. No rash noted. No erythema. No pallor.  Psychiatric:       Today admits to lack of motivation for self care  Explains she has to take care of  Family (family member disagrees) Pt seems generally down and pitying herself Not tearful Fair eye contact           Assessment & Plan:

## 2011-08-20 ENCOUNTER — Ambulatory Visit (INDEPENDENT_AMBULATORY_CARE_PROVIDER_SITE_OTHER): Payer: Medicare Other | Admitting: Family Medicine

## 2011-08-20 ENCOUNTER — Ambulatory Visit (INDEPENDENT_AMBULATORY_CARE_PROVIDER_SITE_OTHER)
Admission: RE | Admit: 2011-08-20 | Discharge: 2011-08-20 | Disposition: A | Discharge status: Home / Self Care | Payer: Medicare Other | Source: Ambulatory Visit | Attending: Family Medicine | Admitting: Family Medicine

## 2011-08-20 ENCOUNTER — Encounter: Payer: Self-pay | Admitting: Family Medicine

## 2011-08-20 VITALS — BP 124/64 | HR 78 | Temp 99.2°F | Ht 63.0 in | Wt 175.0 lb

## 2011-08-20 DIAGNOSIS — M25559 Pain in unspecified hip: Secondary | ICD-10-CM

## 2011-08-20 DIAGNOSIS — M25572 Pain in left ankle and joints of left foot: Secondary | ICD-10-CM

## 2011-08-20 DIAGNOSIS — M25552 Pain in left hip: Secondary | ICD-10-CM

## 2011-08-20 DIAGNOSIS — M25579 Pain in unspecified ankle and joints of unspecified foot: Secondary | ICD-10-CM

## 2011-08-20 DIAGNOSIS — M25519 Pain in unspecified shoulder: Secondary | ICD-10-CM

## 2011-08-20 DIAGNOSIS — M25512 Pain in left shoulder: Secondary | ICD-10-CM

## 2011-08-20 DIAGNOSIS — R55 Syncope and collapse: Secondary | ICD-10-CM

## 2011-08-20 NOTE — Patient Instructions (Signed)
On first review- xrays look ok - but I'm waiting for the radiologist interpretation This weekend elevate your ankle and use cold compresses on shoulder , ankle and hip  Take it easy -make sure you drink lots of water , eat regular meals without skipping , and watch blood sugar closely  No prolonged standing or work  If you feel poorly again -- call 911  Follow up with me next week please for further evaluation

## 2011-08-20 NOTE — Progress Notes (Signed)
Subjective:    Patient ID: Amanda Benson, female    DOB: December 04, 1935, 76 y.o.   MRN: [id number]  HPI This am was standing and cooking at 8:30 am  Suddenly felt cold and sweaty at the same time  (no nausea )  Had not not eaten yet or taken med  Sugar was 233 right after fall  1 hour ago was 201   Lost consciousness- very briefly - heard someone yelling for her  Larey Seat on her L side- does not know what she hit  Landed beside fridge and stove   Pain is in L ankle (surgery there in the past)-- has pins Also =buttock and hip area and also L upper arm   Ankle is more swollen than it usually is   No bruising in arm or hip   Can bear weight - but not easily - is painful Is having some pain in the groin area also   No meds  Was ok initially - was weak- got on couch and went to sleep  (after eating   Has a cold - feeling worn out for 2 weeks  ? If fever- has felt warm at times  No prod cough , and thinks she is getting better No urinary symptoms   Patient Active Problem List  Diagnoses  . DIABETES MELLITUS, TYPE II  . HYPERLIPIDEMIA  . GRIEF REACTION  . DEPRESSION  . HYPERTENSION  . OSTEOARTHRITIS  . OSTEOPOROSIS  . KNEE PAIN  . Abnormal TSH  . Obesity  . Left hip pain  . Left shoulder pain  . Left ankle pain   Past Medical History  Diagnosis Date  . Depression   . Diabetes mellitus     type II  . Hypertension   . Osteoporosis   . Hyperlipidemia   . Obesity    Past Surgical History  Procedure Date  . Cholecystectomy   . Ankle surgery     left medial malleblus fracture   History  Substance Use Topics  . Smoking status: Never Smoker   . Smokeless tobacco: Not on file  . Alcohol Use: Not on file   Family History  Problem Relation Age of Onset  . Diabetes Mother   . Hypertension Mother   . Asthma Father   . Hypertension Sister   . Cancer Sister     lung  . Asthma Brother   . COPD Brother   . Cancer Maternal Grandmother     breast   . Asthma Brother      Allergies  Allergen Reactions  . Alendronate Sodium     REACTION: heartburn   Current Outpatient Prescriptions on File Prior to Visit  Medication Sig Dispense Refill  . aspirin 81 MG EC tablet Take 81 mg by mouth daily.        . Blood Glucose Monitoring Suppl (ONE TOUCH ULTRA SYSTEM KIT) W/DEVICE KIT To check sugar daily and as needed for DM2 250.0   1 each  0  . buPROPion (WELLBUTRIN XL) 300 MG 24 hr tablet Take 1 tablet (300 mg total) by mouth daily.  90 tablet  3  . FLUoxetine (PROZAC) 40 MG capsule Take 1 capsule (40 mg total) by mouth daily.  90 capsule  3  . glipiZIDE (GLIPIZIDE XL) 10 MG 24 hr tablet Take 1 tablet (10 mg total) by mouth daily.  90 tablet  3  . Lancets (ONETOUCH ULTRASOFT) lancets To check sugar twice daily and as needed for DM2  250.00  100 each  11  . metFORMIN (GLUCOPHAGE) 1000 MG tablet Take 1 tablet (1,000 mg total) by mouth 2 (two) times daily with a meal.  180 tablet  3  . olmesartan-hydrochlorothiazide (BENICAR HCT) 40-25 MG per tablet Take 1 tablet by mouth daily.  90 tablet  3  . pioglitazone (ACTOS) 15 MG tablet Take 1 tablet (15 mg total) by mouth daily.  90 tablet  3  . simvastatin (ZOCOR) 20 MG tablet Take 1 tablet (20 mg total) by mouth daily.  90 tablet  3     Review of Systems Review of Systems  Constitutional: Negative for fever, appetite change,  and unexpected weight change. pos for fatigue  ENT pos for nasal congestion and post nasal drip  Eyes: Negative for pain and visual disturbance.  Respiratory: Negative for cough and shortness of breath.   Cardiovascular: Negative for cp or palpitations    Gastrointestinal: Negative for nausea, diarrhea and constipation.  Genitourinary: Negative for urgency and frequency.  Skin: Negative for pallor or rash   Neurological: Negative for light-headedness, numbness and headaches. pos for episode of syncope/ pre syncope Hematological: Negative for adenopathy. Does not bruise/bleed easily.   Psychiatric/Behavioral: Negative for dysphoric mood. The patient is not nervous/anxious.         Objective:   Physical Exam  Constitutional: She appears well-developed and well-nourished. No distress.  HENT:  Head: Normocephalic and atraumatic.  Mouth/Throat: Oropharynx is clear and moist.  Eyes: Conjunctivae and EOM are normal. Pupils are equal, round, and reactive to light.  Neck: Normal range of motion. Neck supple. No JVD present. No spinous process tenderness and no muscular tenderness present. Carotid bruit is not present. No thyromegaly present.  Cardiovascular: Normal rate, regular rhythm, normal heart sounds and intact distal pulses.  Exam reveals no gallop.   Pulmonary/Chest: Effort normal and breath sounds normal. No respiratory distress. She has no wheezes. She has no rales.  Abdominal: Soft. Bowel sounds are normal. She exhibits no distension. There is no tenderness.  Musculoskeletal: She exhibits edema and tenderness.       L shoulder- some lateral tenderness over bicep tendon  Some pain on full abduction  Neg hawking test, nl grip   L hip- full rom with some pain on flex and internal rotation Nl appearance and no leg length abnormalities No pelvic tenderness Mild troch tenderness  L ankl - lateral malleolus swelling with pain to dorsiflex and invert No bruising  Limited rom due to prev surgery  Lymphadenopathy:    She has no cervical adenopathy.  Neurological: She is alert. She has normal reflexes. No cranial nerve deficit. She exhibits normal muscle tone. Coordination normal.  Skin: Skin is warm and dry. No rash noted. No erythema. No pallor.  Psychiatric: She has a normal mood and affect.          Assessment & Plan:

## 2011-08-22 DIAGNOSIS — R55 Syncope and collapse: Secondary | ICD-10-CM | POA: Insufficient documentation

## 2011-08-22 NOTE — Assessment & Plan Note (Signed)
After a fall / injury No fx or disloc seen on xray- pend rad rev

## 2011-08-22 NOTE — Assessment & Plan Note (Signed)
Vs pre syncope? -- no seizure activity noted Some symptoms resembling hypoglycemia (but sugar was high) or vasovagal episode  Disc meals/ more frequent sugar checks  Pt had episode of sweating/ weakness here also that was resolved after eating  F/u next week for further eval - family will stay with her over the weekend

## 2011-08-22 NOTE — Assessment & Plan Note (Signed)
After injury  Baseline loss of rom due to old injury/ surg Pins seen on xray No fracture seen on xray- pending rad rev Recommend ice/elevation , and wrapped firmly with ace bandage for stability Pt has cane/ walker

## 2011-08-22 NOTE — Assessment & Plan Note (Signed)
After a fall  No disloc or fx seen on xray - pend rad review

## 2011-08-27 ENCOUNTER — Encounter: Payer: Self-pay | Admitting: Family Medicine

## 2011-08-27 ENCOUNTER — Ambulatory Visit (INDEPENDENT_AMBULATORY_CARE_PROVIDER_SITE_OTHER): Payer: Medicare Other | Admitting: Family Medicine

## 2011-08-27 VITALS — BP 132/70 | HR 71 | Temp 98.0°F | Ht 63.0 in | Wt 176.8 lb

## 2011-08-27 DIAGNOSIS — E119 Type 2 diabetes mellitus without complications: Secondary | ICD-10-CM

## 2011-08-27 DIAGNOSIS — R55 Syncope and collapse: Secondary | ICD-10-CM

## 2011-08-27 NOTE — Patient Instructions (Signed)
I'm glad you are feeling better  I want you to watch blood sugars and let me know if any low readings Stop the actos for now  If any more episodes of weakness or fainting let me know immediately  Try not to miss any medicines Follow up with me in 3 months

## 2011-08-27 NOTE — Assessment & Plan Note (Signed)
Last a1c 6.3- still concern about pos hypoglycemia  Will stop actos  F/u 3 mo  Continue checking sugar bid - alert if very high or low

## 2011-08-27 NOTE — Progress Notes (Signed)
Subjective:    Patient ID: Amanda Benson, female    DOB: 11/01/1935, 76 y.o.   MRN: [id number]  HPI Here for f/u of fall  Foot is still a little sore  Nothing was broken  Otherwise feels ok   Generally weak  No more fainting episodes  No focal weakness   Mood is pretty good today  Does have stress no cp or palpitations   Family noticed she did miss some of her am meds right before this happened   Has been keeping track of her sugars  In general sugars are usually -- 120s-130 in am - occ lower , pms vary more but most likely more around 140s   Patient Active Problem List  Diagnoses  . DIABETES MELLITUS, TYPE II  . HYPERLIPIDEMIA  . GRIEF REACTION  . DEPRESSION  . HYPERTENSION  . OSTEOARTHRITIS  . OSTEOPOROSIS  . KNEE PAIN  . Abnormal TSH  . Obesity  . Left hip pain  . Left shoulder pain  . Left ankle pain  . Syncope   Past Medical History  Diagnosis Date  . Depression   . Diabetes mellitus     type II  . Hypertension   . Osteoporosis   . Hyperlipidemia   . Obesity    Past Surgical History  Procedure Date  . Cholecystectomy   . Ankle surgery     left medial malleblus fracture   History  Substance Use Topics  . Smoking status: Never Smoker   . Smokeless tobacco: Not on file  . Alcohol Use: Not on file   Family History  Problem Relation Age of Onset  . Diabetes Mother   . Hypertension Mother   . Asthma Father   . Hypertension Sister   . Cancer Sister     lung  . Asthma Brother   . COPD Brother   . Cancer Maternal Grandmother     breast   . Asthma Brother    Allergies  Allergen Reactions  . Alendronate Sodium     REACTION: heartburn   Current Outpatient Prescriptions on File Prior to Visit  Medication Sig Dispense Refill  . aspirin 81 MG EC tablet Take 81 mg by mouth daily.        . Blood Glucose Monitoring Suppl (ONE TOUCH ULTRA SYSTEM KIT) W/DEVICE KIT To check sugar daily and as needed for DM2 250.0   1 each  0  . buPROPion  (WELLBUTRIN XL) 300 MG 24 hr tablet Take 1 tablet (300 mg total) by mouth daily.  90 tablet  3  . FLUoxetine (PROZAC) 40 MG capsule Take 1 capsule (40 mg total) by mouth daily.  90 capsule  3  . glipiZIDE (GLIPIZIDE XL) 10 MG 24 hr tablet Take 1 tablet (10 mg total) by mouth daily.  90 tablet  3  . Lancets (ONETOUCH ULTRASOFT) lancets To check sugar twice daily and as needed for DM2  250.00  100 each  11  . metFORMIN (GLUCOPHAGE) 1000 MG tablet Take 1 tablet (1,000 mg total) by mouth 2 (two) times daily with a meal.  180 tablet  3  . olmesartan-hydrochlorothiazide (BENICAR HCT) 40-25 MG per tablet Take 1 tablet by mouth daily.  90 tablet  3  . pioglitazone (ACTOS) 15 MG tablet Take 1 tablet (15 mg total) by mouth daily.  90 tablet  3  . simvastatin (ZOCOR) 20 MG tablet Take 1 tablet (20 mg total) by mouth daily.  90 tablet  3  Review of Systems    Review of Systems  Constitutional: Negative for fever, appetite change, fatigue and unexpected weight change.  Eyes: Negative for pain and visual disturbance.  Respiratory: Negative for cough and shortness of breath.   Cardiovascular: Negative for cp or palpitations    Gastrointestinal: Negative for nausea, diarrhea and constipation.  Genitourinary: Negative for urgency and frequency.  Skin: Negative for pallor or rash   Msk pos for aches and pains- improved  Neurological: Negative for weakness, light-headedness, numbness and headaches. pos for unsteadiness some days at home, has a walker Hematological: Negative for adenopathy. Does not bruise/bleed easily.  Psychiatric/Behavioral: Negative for dysphoric mood. The patient is not nervous/anxious.      Objective:   Physical Exam  Constitutional: She appears well-developed and well-nourished. No distress.  HENT:  Head: Normocephalic and atraumatic.  Mouth/Throat: Oropharynx is clear and moist.  Eyes: Conjunctivae and EOM are normal. Pupils are equal, round, and reactive to light. No scleral  icterus.  Neck: Normal range of motion. Neck supple. No JVD present. Carotid bruit is not present. No thyromegaly present.  Cardiovascular: Normal rate, regular rhythm, normal heart sounds and intact distal pulses.  Exam reveals no gallop.   Pulmonary/Chest: Effort normal and breath sounds normal. No respiratory distress. She has no wheezes.  Abdominal: Soft. Bowel sounds are normal. She exhibits no distension, no abdominal bruit and no mass. There is no tenderness.  Musculoskeletal: Normal range of motion. She exhibits no edema and no tenderness.  Lymphadenopathy:    She has no cervical adenopathy.  Neurological: She is alert. She has normal reflexes. No cranial nerve deficit. She exhibits normal muscle tone. Coordination normal.  Skin: Skin is warm and dry. No rash noted. No erythema. No pallor.       Bruising from prev injuries are improved   Psychiatric: She has a normal mood and affect.          Assessment & Plan:

## 2011-08-27 NOTE — Assessment & Plan Note (Addendum)
Did EKG today - showed NSR with rate of 72 and poor R wave progression, also voltage criteria for LVH Decided to stop the actos -and see how she does  May consider echocardiogram in the future - feeling fine now  Will watch sugar control  After discussion -reason for episode may have been missing medicines for several days-and now family has nurse helping out with meds at home  F/u 3 mo  Will update if any further episodes in the meantime

## 2011-11-26 ENCOUNTER — Ambulatory Visit (INDEPENDENT_AMBULATORY_CARE_PROVIDER_SITE_OTHER): Payer: Medicare Other | Admitting: Family Medicine

## 2011-11-26 ENCOUNTER — Encounter: Payer: Self-pay | Admitting: Family Medicine

## 2011-11-26 VITALS — BP 130/72 | HR 73 | Temp 98.5°F | Ht 63.75 in | Wt 175.0 lb

## 2011-11-26 DIAGNOSIS — Z23 Encounter for immunization: Secondary | ICD-10-CM

## 2011-11-26 DIAGNOSIS — I10 Essential (primary) hypertension: Secondary | ICD-10-CM

## 2011-11-26 DIAGNOSIS — E119 Type 2 diabetes mellitus without complications: Secondary | ICD-10-CM

## 2011-11-26 LAB — COMPREHENSIVE METABOLIC PANEL
Albumin: 3.5 g/dL (ref 3.5–5.2)
Alkaline Phosphatase: 61 U/L (ref 39–117)
BUN: 18 mg/dL (ref 6–23)
CO2: 32 mEq/L (ref 19–32)
GFR: 61.05 mL/min (ref >60.00)
Glucose, Bld: 175 mg/dL — ABNORMAL HIGH (ref 70–99)
Total Bilirubin: 0.5 mg/dL (ref 0.3–1.2)
Total Protein: 6.6 g/dL (ref 6.0–8.3)

## 2011-11-26 NOTE — Assessment & Plan Note (Signed)
Off actos now-doing ok  Home sugars sound stable No hypoglycemia and feels better a1c today  F/u 6 mo for annual exam

## 2011-11-26 NOTE — Assessment & Plan Note (Signed)
bp in fair control at this time  No changes needed  Disc lifstyle change with low sodium diet and exercise  Lab today 

## 2011-11-26 NOTE — Patient Instructions (Addendum)
Continue current medicines  Lab today Stick to a diabetic diet and stay active  Pneumonia vaccine today  If you are interested in a shingles/zoster vaccine - call your insurance to check on coverage,( you should not get it within 1 month of other vaccines) , then call us for a prescription  for it to take to a pharmacy that gives the shot  Follow up in 6 months for an annual exam with labs prior

## 2011-11-26 NOTE — Progress Notes (Signed)
Subjective:    Patient ID: Amanda Benson, female    DOB: November 06, 1935, 76 y.o.   MRN: [id number]  HPI Here for f/u of chronic conditions Is doing allright  No more episodes   bp is stable today  No cp or palpitations or headaches or edema  No side effects to medicines  BP Readings from Last 3 Encounters:  11/26/11 130/72  08/27/11 132/70  08/20/11 124/64     Wt is down 2 lb  Diabetes Home sugar results -in general, pretty good --most of them are under 150 -- with a few exceptions No low sugars  DM diet - is eating healthy  Exercise -cleans house, quite active  Symptoms A1C last  Lab Results  Component Value Date   HGBA1C 6.7* 06/07/2011    No problems with medications metformin and glipizide Renal protection-on ace  Last eye exam - end of 2012 - was ok   now off actos- ok without it she thinks   Patient Active Problem List  Diagnosis  . DIABETES MELLITUS, TYPE II  . HYPERLIPIDEMIA  . GRIEF REACTION  . DEPRESSION  . HYPERTENSION  . OSTEOARTHRITIS  . OSTEOPOROSIS  . KNEE PAIN  . Abnormal TSH  . Obesity  . Left hip pain  . Left shoulder pain  . Left ankle pain  . Syncope   Past Medical History  Diagnosis Date  . Depression   . Diabetes mellitus     type II  . Hypertension   . Osteoporosis   . Hyperlipidemia   . Obesity    Past Surgical History  Procedure Date  . Cholecystectomy   . Ankle surgery     left medial malleblus fracture   History  Substance Use Topics  . Smoking status: Never Smoker   . Smokeless tobacco: Not on file  . Alcohol Use: Not on file   Family History  Problem Relation Age of Onset  . Diabetes Mother   . Hypertension Mother   . Asthma Father   . Hypertension Sister   . Cancer Sister     lung  . Asthma Brother   . COPD Brother   . Cancer Maternal Grandmother     breast   . Asthma Brother    Allergies  Allergen Reactions  . Alendronate Sodium     REACTION: heartburn   Current Outpatient Prescriptions on File  Prior to Visit  Medication Sig Dispense Refill  . aspirin 81 MG EC tablet Take 81 mg by mouth daily.        . Blood Glucose Monitoring Suppl (ONE TOUCH ULTRA SYSTEM KIT) W/DEVICE KIT To check sugar daily and as needed for DM2 250.0   1 each  0  . buPROPion (WELLBUTRIN XL) 300 MG 24 hr tablet Take 1 tablet (300 mg total) by mouth daily.  90 tablet  3  . FLUoxetine (PROZAC) 40 MG capsule Take 1 capsule (40 mg total) by mouth daily.  90 capsule  3  . glipiZIDE (GLIPIZIDE XL) 10 MG 24 hr tablet Take 1 tablet (10 mg total) by mouth daily.  90 tablet  3  . Lancets (ONETOUCH ULTRASOFT) lancets To check sugar twice daily and as needed for DM2  250.00  100 each  11  . metFORMIN (GLUCOPHAGE) 1000 MG tablet Take 1 tablet (1,000 mg total) by mouth 2 (two) times daily with a meal.  180 tablet  3  . olmesartan-hydrochlorothiazide (BENICAR HCT) 40-25 MG per tablet Take 1 tablet by mouth daily.  90 tablet  3  . simvastatin (ZOCOR) 20 MG tablet Take 1 tablet (20 mg total) by mouth daily.  90 tablet  3      Review of Systems Review of Systems  Constitutional: Negative for fever, appetite change, fatigue and unexpected weight change.  Eyes: Negative for pain and visual disturbance.  Respiratory: Negative for cough and shortness of breath.   Cardiovascular: Negative for cp or palpitations    Gastrointestinal: Negative for nausea, diarrhea and constipation.  Genitourinary: Negative for urgency and frequency.  Skin: Negative for pallor or rash   Neurological: Negative for weakness, light-headedness, numbness and headaches.  Hematological: Negative for adenopathy. Does not bruise/bleed easily.  Psychiatric/Behavioral: Negative for dysphoric mood. The patient is not nervous/anxious.         Objective:   Physical Exam  Constitutional: She appears well-developed and well-nourished. No distress.       obese and well appearing   HENT:  Head: Normocephalic and atraumatic.  Mouth/Throat: Oropharynx is clear  and moist.  Eyes: Conjunctivae and EOM are normal. Pupils are equal, round, and reactive to light. No scleral icterus.  Neck: Normal range of motion. Neck supple. No JVD present. Carotid bruit is not present. No thyromegaly present.  Cardiovascular: Normal rate, regular rhythm and normal heart sounds.   Pulmonary/Chest: Effort normal and breath sounds normal. No respiratory distress. She has no wheezes.  Abdominal: Soft. Bowel sounds are normal. She exhibits no abdominal bruit.  Musculoskeletal: She exhibits no edema.  Lymphadenopathy:    She has no cervical adenopathy.  Neurological: She is alert. She has normal reflexes. No cranial nerve deficit. She exhibits normal muscle tone. Coordination normal.  Skin: Skin is warm and dry. No rash noted. No erythema. No pallor.  Psychiatric: She has a normal mood and affect.          Assessment & Plan:

## 2011-12-08 ENCOUNTER — Other Ambulatory Visit: Payer: Medicare Other

## 2011-12-15 ENCOUNTER — Encounter: Payer: Medicare Other | Admitting: Family Medicine

## 2012-05-03 ENCOUNTER — Telehealth: Payer: Self-pay | Admitting: Family Medicine

## 2012-05-03 DIAGNOSIS — R7989 Other specified abnormal findings of blood chemistry: Secondary | ICD-10-CM

## 2012-05-03 DIAGNOSIS — M810 Age-related osteoporosis without current pathological fracture: Secondary | ICD-10-CM

## 2012-05-03 DIAGNOSIS — I10 Essential (primary) hypertension: Secondary | ICD-10-CM

## 2012-05-03 DIAGNOSIS — E785 Hyperlipidemia, unspecified: Secondary | ICD-10-CM

## 2012-05-03 DIAGNOSIS — E119 Type 2 diabetes mellitus without complications: Secondary | ICD-10-CM

## 2012-05-03 NOTE — Telephone Encounter (Signed)
Message copied by Judy Pimple on Wed May 03, 2012 10:24 AM ------      Message from: Alvina Chou      Created: Wed Apr 26, 2012  3:36 PM      Regarding: lab orders for Thursday, 1.30.14       Patient is scheduled for CPX labs, please order future labs, Thanks , Camelia Eng

## 2012-05-04 ENCOUNTER — Other Ambulatory Visit: Payer: Medicare Other

## 2012-05-08 ENCOUNTER — Other Ambulatory Visit (INDEPENDENT_AMBULATORY_CARE_PROVIDER_SITE_OTHER): Payer: Medicare Other

## 2012-05-08 DIAGNOSIS — R7989 Other specified abnormal findings of blood chemistry: Secondary | ICD-10-CM

## 2012-05-08 DIAGNOSIS — R6889 Other general symptoms and signs: Secondary | ICD-10-CM

## 2012-05-08 DIAGNOSIS — M810 Age-related osteoporosis without current pathological fracture: Secondary | ICD-10-CM

## 2012-05-08 DIAGNOSIS — I10 Essential (primary) hypertension: Secondary | ICD-10-CM

## 2012-05-08 DIAGNOSIS — E785 Hyperlipidemia, unspecified: Secondary | ICD-10-CM

## 2012-05-08 DIAGNOSIS — E119 Type 2 diabetes mellitus without complications: Secondary | ICD-10-CM

## 2012-05-08 LAB — CBC WITH DIFFERENTIAL/PLATELET
Basophils Relative: 0.4 % (ref 0.0–3.0)
Eosinophils Relative: 2.4 % (ref 0.0–5.0)
HCT: 38.8 % (ref 36.0–46.0)
MCV: 88 fl (ref 78.0–100.0)
Monocytes Absolute: 0.6 10*3/uL (ref 0.1–1.0)
Monocytes Relative: 8 % (ref 3.0–12.0)
Neutrophils Relative %: 70.1 % (ref 43.0–77.0)
Platelets: 264 10*3/uL (ref 150.0–400.0)
RBC: 4.41 Mil/uL (ref 3.87–5.11)
WBC: 6.9 10*3/uL (ref 4.5–10.5)

## 2012-05-08 LAB — HEMOGLOBIN A1C: Hgb A1c MFr Bld: 7.2 % — ABNORMAL HIGH (ref 4.6–6.5)

## 2012-05-08 LAB — COMPREHENSIVE METABOLIC PANEL
Albumin: 3.5 g/dL (ref 3.5–5.2)
BUN: 21 mg/dL (ref 6–23)
CO2: 32 mEq/L (ref 19–32)
Calcium: 9.7 mg/dL (ref 8.4–10.5)
GFR: 52.59 mL/min — ABNORMAL LOW (ref >60.00)
Glucose, Bld: 222 mg/dL — ABNORMAL HIGH (ref 70–99)
Potassium: 3.8 mEq/L (ref 3.5–5.1)
Sodium: 142 mEq/L (ref 135–145)
Total Protein: 6.7 g/dL (ref 6.0–8.3)

## 2012-05-08 LAB — TSH: TSH: 4.31 u[IU]/mL (ref 0.35–5.50)

## 2012-05-08 LAB — LIPID PANEL: Cholesterol: 130 mg/dL (ref 0–200)

## 2012-05-09 LAB — VITAMIN D 25 HYDROXY (VIT D DEFICIENCY, FRACTURES): Vit D, 25-Hydroxy: 76 ng/mL (ref 30–89)

## 2012-05-10 ENCOUNTER — Encounter: Payer: Self-pay | Admitting: Family Medicine

## 2012-05-10 ENCOUNTER — Ambulatory Visit (INDEPENDENT_AMBULATORY_CARE_PROVIDER_SITE_OTHER): Payer: Medicare Other | Admitting: Family Medicine

## 2012-05-10 VITALS — BP 140/70 | HR 79 | Temp 98.5°F | Ht 63.25 in | Wt 169.0 lb

## 2012-05-10 DIAGNOSIS — I10 Essential (primary) hypertension: Secondary | ICD-10-CM

## 2012-05-10 DIAGNOSIS — Z1211 Encounter for screening for malignant neoplasm of colon: Secondary | ICD-10-CM

## 2012-05-10 DIAGNOSIS — E119 Type 2 diabetes mellitus without complications: Secondary | ICD-10-CM

## 2012-05-10 DIAGNOSIS — M810 Age-related osteoporosis without current pathological fracture: Secondary | ICD-10-CM

## 2012-05-10 DIAGNOSIS — E785 Hyperlipidemia, unspecified: Secondary | ICD-10-CM

## 2012-05-10 DIAGNOSIS — Z23 Encounter for immunization: Secondary | ICD-10-CM

## 2012-05-10 NOTE — Progress Notes (Signed)
Subjective:    Patient ID: Amanda Benson, female    DOB: 1936/03/21, 77 y.o.   MRN: [id number]  HPI Here for check up of chronic medical conditions and to review health mt list   Is feeling good   Colon cancer screen-not interested in colonoscopy  Zoster status- ? May want a vaccine   Flu shot- had it in the fall   Just one fall since the last visit - she slipped on something - but balance is not great  Has a walker - but will not use it   Td 04- needs one today   mammo ? 2010? - was at [facility]  Self exam-no lumps or changes   Gyn-no problems at all    bp is up today- always on the first check  No cp or palpitations or headaches or edema  No side effects to medicines  BP Readings from Last 3 Encounters:  05/10/12 152/82  11/26/11 130/72  08/27/11 132/70     Wt is down 6 lb- is trying to watch her diet   DM a1c is up to 7.2 Sugars at home 120s-150s for the most part  Off actos- and happy about that  opthy is not due yet   OP-- due for her 2 year dexa (no fractures)- wants to get that at [facility]  D level is 76  Mood --sometimes good but has ups and downs (is quite chipper today) - she does well overall with current medicines   Patient Active Problem List  Diagnosis  . DIABETES MELLITUS, TYPE II  . HYPERLIPIDEMIA  . GRIEF REACTION  . DEPRESSION  . HYPERTENSION  . OSTEOARTHRITIS  . OSTEOPOROSIS  . KNEE PAIN  . Abnormal TSH  . Obesity  . Left hip pain  . Left shoulder pain  . Left ankle pain  . Syncope   Past Medical History  Diagnosis Date  . Depression   . Diabetes mellitus     type II  . Hypertension   . Osteoporosis   . Hyperlipidemia   . Obesity    Past Surgical History  Procedure Date  . Cholecystectomy   . Ankle surgery     left medial malleblus fracture   History  Substance Use Topics  . Smoking status: Never Smoker   . Smokeless tobacco: Not on file  . Alcohol Use: No   Family History  Problem Relation Age of Onset  . Diabetes  Mother   . Hypertension Mother   . Asthma Father   . Hypertension Sister   . Cancer Sister     lung  . Asthma Brother   . COPD Brother   . Cancer Maternal Grandmother     breast   . Asthma Brother    Allergies  Allergen Reactions  . Alendronate Sodium     REACTION: heartburn   Current Outpatient Prescriptions on File Prior to Visit  Medication Sig Dispense Refill  . aspirin 81 MG EC tablet Take 81 mg by mouth daily.        . Blood Glucose Monitoring Suppl (ONE TOUCH ULTRA SYSTEM KIT) W/DEVICE KIT To check sugar daily and as needed for DM2 250.0   1 each  0  . buPROPion (WELLBUTRIN XL) 300 MG 24 hr tablet Take 1 tablet (300 mg total) by mouth daily.  90 tablet  3  . FLUoxetine (PROZAC) 40 MG capsule Take 1 capsule (40 mg total) by mouth daily.  90 capsule  3  . glipiZIDE (GLIPIZIDE XL)  10 MG 24 hr tablet Take 1 tablet (10 mg total) by mouth daily.  90 tablet  3  . metFORMIN (GLUCOPHAGE) 1000 MG tablet Take 1 tablet (1,000 mg total) by mouth 2 (two) times daily with a meal.  180 tablet  3  . olmesartan-hydrochlorothiazide (BENICAR HCT) 40-25 MG per tablet Take 1 tablet by mouth daily.  90 tablet  3  . simvastatin (ZOCOR) 20 MG tablet Take 1 tablet (20 mg total) by mouth daily.  90 tablet  3      Review of Systems Review of Systems  Constitutional: Negative for fever, appetite change, fatigue and unexpected weight change.  Eyes: Negative for pain and visual disturbance.  Respiratory: Negative for cough and shortness of breath.   Cardiovascular: Negative for cp or palpitations    Gastrointestinal: Negative for nausea, diarrhea and constipation.  Genitourinary: Negative for urgency and frequency.  Skin: Negative for pallor or rash   Neurological: Negative for weakness, light-headedness, numbness and headaches.  Hematological: Negative for adenopathy. Does not bruise/bleed easily.  Psychiatric/Behavioral: Negative for dysphoric mood. The patient is not nervous/anxious.          Objective:   Physical Exam  Constitutional: She appears well-developed and well-nourished. No distress.  HENT:  Head: Normocephalic and atraumatic.  Right Ear: External ear normal.  Left Ear: External ear normal.  Nose: Nose normal.  Mouth/Throat: Oropharynx is clear and moist. No oropharyngeal exudate.  Eyes: Conjunctivae normal and EOM are normal. Pupils are equal, round, and reactive to light. Right eye exhibits no discharge. Left eye exhibits no discharge. No scleral icterus.  Neck: Normal range of motion. Neck supple. No JVD present. Carotid bruit is not present. No thyromegaly present.  Cardiovascular: Normal rate, regular rhythm, normal heart sounds and intact distal pulses.  Exam reveals no gallop.   Pulmonary/Chest: Effort normal and breath sounds normal. No respiratory distress. She has no wheezes. She exhibits no tenderness.  Abdominal: Soft. Bowel sounds are normal. She exhibits no distension, no abdominal bruit and no mass. There is no tenderness.  Genitourinary: No breast swelling, tenderness, discharge or bleeding.       Breast exam: No mass, nodules, thickening, tenderness, bulging, retraction, inflamation, nipple discharge or skin changes noted.  No axillary or clavicular LA.  Chaperoned exam.    Musculoskeletal: Normal range of motion. She exhibits no edema and no tenderness.  Lymphadenopathy:    She has no cervical adenopathy.  Neurological: She is alert. She has normal reflexes. No cranial nerve deficit. She exhibits normal muscle tone. Coordination normal.  Skin: Skin is warm and dry. No rash noted. No erythema. No pallor.  Psychiatric: She has a normal mood and affect.          Assessment & Plan:

## 2012-05-10 NOTE — Patient Instructions (Addendum)
Don't forget to make your own mammogram appt at [facility]-they can give you the phone number on the way out  Do stool card for colon cancer screening please If you are interested in a shingles/zoster vaccine - call your insurance to check on coverage,( you should not get it within 1 month of other vaccines) , then call us for a prescription  for it to take to a pharmacy that gives the shot , or make a nurse visit to get it here depending on your coverage Tetanus shot today  We will schedule a bone density test at check out

## 2012-05-11 NOTE — Assessment & Plan Note (Signed)
Lab Results  Component Value Date   HGBA1C 7.2* 05/08/2012    Off actos -this went up less than expected Rev low glycemic diet

## 2012-05-11 NOTE — Assessment & Plan Note (Signed)
Scheduled dexa Rev ca and D and exercise No fractures

## 2012-05-11 NOTE — Assessment & Plan Note (Signed)
Pt declines colonosc but will do IFOB No stool changes

## 2012-05-11 NOTE — Assessment & Plan Note (Signed)
Better bp in 2nd check bp in fair control at this time  No changes needed  Disc lifstyle change with low sodium diet and exercise

## 2012-05-11 NOTE — Assessment & Plan Note (Signed)
Disc goals for lipids and reasons to control them Rev labs with pt Rev low sat fat diet in detail   

## 2012-06-01 ENCOUNTER — Encounter: Payer: Self-pay | Admitting: Family Medicine

## 2012-06-01 ENCOUNTER — Ambulatory Visit: Payer: Self-pay | Admitting: Family Medicine

## 2012-06-01 LAB — HM DEXA SCAN

## 2012-06-02 ENCOUNTER — Encounter: Payer: Self-pay | Admitting: Family Medicine

## 2012-06-02 ENCOUNTER — Ambulatory Visit: Payer: Self-pay | Admitting: Family Medicine

## 2012-06-06 ENCOUNTER — Encounter: Payer: Self-pay | Admitting: Family Medicine

## 2012-06-06 ENCOUNTER — Encounter: Payer: Self-pay | Admitting: *Deleted

## 2012-06-07 ENCOUNTER — Encounter: Payer: Self-pay | Admitting: *Deleted

## 2012-06-14 ENCOUNTER — Other Ambulatory Visit: Payer: Self-pay | Admitting: Family Medicine

## 2012-06-22 ENCOUNTER — Other Ambulatory Visit: Payer: Self-pay | Admitting: Family Medicine

## 2012-06-22 NOTE — Telephone Encounter (Signed)
Please refil for 12 mo, thanks

## 2012-06-22 NOTE — Telephone Encounter (Signed)
done

## 2012-06-22 NOTE — Telephone Encounter (Signed)
Ok to refill 

## 2012-06-28 ENCOUNTER — Other Ambulatory Visit: Payer: Self-pay

## 2012-06-28 MED ORDER — OLMESARTAN MEDOXOMIL-HCTZ 40-25 MG PO TABS: 1.0000 | ORAL_TABLET | Freq: Every day | ORAL | Status: DC

## 2012-06-28 NOTE — Telephone Encounter (Signed)
[facility] left v/m requesting refill benicar HCT 40-25. # 90 x 1.

## 2012-07-29 ENCOUNTER — Other Ambulatory Visit: Payer: Self-pay | Admitting: Family Medicine

## 2012-08-12 ENCOUNTER — Other Ambulatory Visit: Payer: Self-pay | Admitting: Family Medicine

## 2012-08-14 NOTE — Telephone Encounter (Signed)
Received refill request electronically. See warning. Is it okay to refill medication? Last office visit 05/10/12.

## 2012-08-14 NOTE — Telephone Encounter (Signed)
Please go ahead and give her 8 mo of refils, thanks

## 2012-08-31 ENCOUNTER — Other Ambulatory Visit: Payer: Self-pay | Admitting: Family Medicine

## 2012-10-16 ENCOUNTER — Encounter: Payer: Self-pay | Admitting: Family Medicine

## 2012-10-16 ENCOUNTER — Ambulatory Visit (INDEPENDENT_AMBULATORY_CARE_PROVIDER_SITE_OTHER): Payer: Medicare Other | Admitting: Family Medicine

## 2012-10-16 VITALS — BP 162/86 | HR 76 | Temp 99.3°F | Ht 63.25 in | Wt 163.5 lb

## 2012-10-16 DIAGNOSIS — R21 Rash and other nonspecific skin eruption: Secondary | ICD-10-CM | POA: Insufficient documentation

## 2012-10-16 MED ORDER — MOMETASONE FUROATE 0.1 % EX CREA: TOPICAL_CREAM | Freq: Every day | CUTANEOUS | Status: DC

## 2012-10-16 NOTE — Assessment & Plan Note (Signed)
Resembles a plant dermatitis- but no hx of exp Disc symptomatic care - see instructions on AVS  Will try zyrtec Elocon cream prn to aff areas (will avoid prednisone in DM if poss) Disc what to avoid in detail Update if not starting to improve in a week or if worsening

## 2012-10-16 NOTE — Patient Instructions (Addendum)
I think you have a topical allergic reaction to something  It resembles poison ivy type rash  Use products without color or fragrance (dove for sensitive skin) and use "free" clothing detergent, skip the fabric softener Stay cool  For itch- get zyrtec 10 mg and take one pill daily  Use the elocon cream on itchy spots as directed Update if not starting to improve in a week or if worsening

## 2012-10-16 NOTE — Progress Notes (Signed)
Subjective:    Patient ID: Amanda Benson, female    DOB: February 11, 1936, 77 y.o.   MRN: [id number]  HPI Here for rash and itching all over her body  About a week - look like small blisters or whelps  Very very itchy No new exposures - food or product wise  This has never happened before   No wheeze or uri symptoms    Using benadryl salve -not helpful  Patient Active Problem List   Diagnosis Date Noted  . Colon cancer screening 05/10/2012  . Syncope 08/22/2011  . Obesity 06/14/2011  . Abnormal TSH 02/10/2011  . OSTEOPOROSIS 06/21/2007  . DIABETES MELLITUS, TYPE II 09/20/2006  . HYPERLIPIDEMIA 09/20/2006  . HYPERTENSION 09/20/2006  . OSTEOARTHRITIS 09/20/2006   Past Medical History  Diagnosis Date  . Depression   . Diabetes mellitus     type II  . Hypertension   . Osteoporosis   . Hyperlipidemia   . Obesity    Past Surgical History  Procedure Laterality Date  . Cholecystectomy    . Ankle surgery      left medial malleblus fracture   History  Substance Use Topics  . Smoking status: Never Smoker   . Smokeless tobacco: Not on file  . Alcohol Use: No   Family History  Problem Relation Age of Onset  . Diabetes Mother   . Hypertension Mother   . Asthma Father   . Hypertension Sister   . Cancer Sister     lung  . Asthma Brother   . COPD Brother   . Cancer Maternal Grandmother     breast   . Asthma Brother    Allergies  Allergen Reactions  . Alendronate Sodium     REACTION: heartburn   Current Outpatient Prescriptions on File Prior to Visit  Medication Sig Dispense Refill  . aspirin 81 MG EC tablet Take 81 mg by mouth daily.        . Blood Glucose Monitoring Suppl (ONE TOUCH ULTRA SYSTEM KIT) W/DEVICE KIT To check sugar daily and as needed for DM2 250.0   1 each  0  . buPROPion (WELLBUTRIN XL) 300 MG 24 hr tablet take 1 tablet by mouth once daily  90 tablet  3  . FLUoxetine (PROZAC) 40 MG capsule take 1 capsule by mouth once daily  90 capsule  1  .  GLIPIZIDE XL 10 MG 24 hr tablet take 1 tablet by mouth once daily  90 tablet  1  . metFORMIN (GLUCOPHAGE) 1000 MG tablet take 1 tablet by mouth twice a day WITH A MEAL  180 tablet  2  . olmesartan-hydrochlorothiazide (BENICAR HCT) 40-25 MG per tablet Take 1 tablet by mouth daily.  90 tablet  1  . simvastatin (ZOCOR) 20 MG tablet take 1 tablet by mouth once daily  90 tablet  3   No current facility-administered medications on file prior to visit.      Review of Systems Review of Systems  Constitutional: Negative for fever, appetite change, fatigue and unexpected weight change.  Eyes: Negative for pain and visual disturbance.  ENT neg for mouth or throat swelling  Respiratory: Negative for cough and shortness of breath.  neg for wheeze Cardiovascular: Negative for cp or palpitations    Gastrointestinal: Negative for nausea, diarrhea and constipation.  Genitourinary: Negative for urgency and frequency.  Skin: Negative for pallor or and pos for itchy rash Neurological: Negative for weakness, light-headedness, numbness and headaches.  Hematological: Negative for adenopathy. Does not  bruise/bleed easily.  Psychiatric/Behavioral: Negative for dysphoric mood. The patient is not nervous/anxious.         Objective:   Physical Exam  Constitutional: She appears well-developed and well-nourished. No distress.  HENT:  Head: Normocephalic and atraumatic.  Mouth/Throat: Oropharynx is clear and moist.  Eyes: Conjunctivae and EOM are normal. Pupils are equal, round, and reactive to light. Right eye exhibits no discharge. Left eye exhibits no discharge. No scleral icterus.  Neck: Normal range of motion. Neck supple. No JVD present. No thyromegaly present.  Cardiovascular: Normal rate and regular rhythm.   Pulmonary/Chest: Effort normal and breath sounds normal. No respiratory distress.  Musculoskeletal: She exhibits no edema.  Lymphadenopathy:    She has no cervical adenopathy.  Neurological: She  is alert.  Skin: Skin is warm and dry. Rash noted.  Patches of erythematous vesicles in linear fashion - over extremities and trunk No excoriation or sign of infection  Psychiatric: She has a normal mood and affect.          Assessment & Plan:

## 2012-11-20 ENCOUNTER — Ambulatory Visit: Payer: Medicare Other | Admitting: Family Medicine

## 2012-11-27 ENCOUNTER — Ambulatory Visit (INDEPENDENT_AMBULATORY_CARE_PROVIDER_SITE_OTHER): Payer: Medicare Other | Admitting: Family Medicine

## 2012-11-27 ENCOUNTER — Encounter: Payer: Self-pay | Admitting: Family Medicine

## 2012-11-27 VITALS — BP 142/82 | HR 81 | Temp 98.5°F | Ht 63.25 in | Wt 164.0 lb

## 2012-11-27 DIAGNOSIS — I10 Essential (primary) hypertension: Secondary | ICD-10-CM

## 2012-11-27 DIAGNOSIS — R2689 Other abnormalities of gait and mobility: Secondary | ICD-10-CM | POA: Insufficient documentation

## 2012-11-27 DIAGNOSIS — E119 Type 2 diabetes mellitus without complications: Secondary | ICD-10-CM

## 2012-11-27 DIAGNOSIS — Z9181 History of falling: Secondary | ICD-10-CM | POA: Insufficient documentation

## 2012-11-27 DIAGNOSIS — R29818 Other symptoms and signs involving the nervous system: Secondary | ICD-10-CM

## 2012-11-27 LAB — COMPREHENSIVE METABOLIC PANEL
ALT: 22 U/L (ref 0–35)
AST: 26 U/L (ref 0–37)
Alkaline Phosphatase: 58 U/L (ref 39–117)
Glucose, Bld: 172 mg/dL — ABNORMAL HIGH (ref 70–99)
Potassium: 3.8 mEq/L (ref 3.5–5.1)
Sodium: 137 mEq/L (ref 135–145)
Total Bilirubin: 0.4 mg/dL (ref 0.3–1.2)
Total Protein: 6.8 g/dL (ref 6.0–8.3)

## 2012-11-27 LAB — HEMOGLOBIN A1C: Hgb A1c MFr Bld: 7.3 % — ABNORMAL HIGH (ref 4.6–6.5)

## 2012-11-27 NOTE — Assessment & Plan Note (Signed)
See eval for poor balance Will use walker at all times for ambulation

## 2012-11-27 NOTE — Assessment & Plan Note (Signed)
bp in fair control at this time  No changes needed  Disc lifstyle change with low sodium diet and exercise  Lab today 

## 2012-11-27 NOTE — Progress Notes (Signed)
Subjective:    Patient ID: Amanda Benson, female    DOB: 13-Nov-1935, 77 y.o.   MRN: [id number]  HPI Here for f/u of chronic health problems   A little unsteady today - daughter thinks this is becoming more common [place] Seat twice at the beach in an RV Has a walker - does not like to use it    Wt is down 5 more lb since feb-has worked on it  bmi is 28  bp is stable today  No cp or palpitations or headaches or edema  No side effects to medicines  BP Readings from Last 3 Encounters:  11/27/12 142/82  10/16/12 162/86  05/10/12 140/70     Diabetes Home sugar results - am avg 120s-130s, and pm low to mid 100s , occ over 200 (just one day) DM diet - is fair / - lately more sweets on trips  Exercise - stays active , does her own housework  Symptoms- none  A1C last  Lab Results  Component Value Date   HGBA1C 7.2* 05/08/2012   Due for a re check  No problems with medications -glipide and metformin (formerly on actos)   Renal protection-- on ARB Last eye exam - 4/14 - went ok - some cataracts     Patient Active Problem List   Diagnosis Date Noted  . Rash and nonspecific skin eruption 10/16/2012  . Colon cancer screening 05/10/2012  . Syncope 08/22/2011  . Obesity 06/14/2011  . Abnormal TSH 02/10/2011  . OSTEOPOROSIS 06/21/2007  . DIABETES MELLITUS, TYPE II 09/20/2006  . HYPERLIPIDEMIA 09/20/2006  . HYPERTENSION 09/20/2006  . OSTEOARTHRITIS 09/20/2006   Past Medical History  Diagnosis Date  . Depression   . Diabetes mellitus     type II  . Hypertension   . Osteoporosis   . Hyperlipidemia   . Obesity    Past Surgical History  Procedure Laterality Date  . Cholecystectomy    . Ankle surgery      left medial malleblus fracture   History  Substance Use Topics  . Smoking status: Never Smoker   . Smokeless tobacco: Not on file  . Alcohol Use: No   Family History  Problem Relation Age of Onset  . Diabetes Mother   . Hypertension Mother   . Asthma Father   .  Hypertension Sister   . Cancer Sister     lung  . Asthma Brother   . COPD Brother   . Cancer Maternal Grandmother     breast   . Asthma Brother    Allergies  Allergen Reactions  . Alendronate Sodium     REACTION: heartburn   Current Outpatient Prescriptions on File Prior to Visit  Medication Sig Dispense Refill  . aspirin 81 MG EC tablet Take 81 mg by mouth daily.        . Blood Glucose Monitoring Suppl (ONE TOUCH ULTRA SYSTEM KIT) W/DEVICE KIT To check sugar daily and as needed for DM2 250.0   1 each  0  . buPROPion (WELLBUTRIN XL) 300 MG 24 hr tablet take 1 tablet by mouth once daily  90 tablet  3  . FLUoxetine (PROZAC) 40 MG capsule take 1 capsule by mouth once daily  90 capsule  1  . GLIPIZIDE XL 10 MG 24 hr tablet take 1 tablet by mouth once daily  90 tablet  1  . metFORMIN (GLUCOPHAGE) 1000 MG tablet take 1 tablet by mouth twice a day WITH A MEAL  180 tablet  2  .  mometasone (ELOCON) 0.1 % cream Apply topically daily. To affected areas as needed  45 g  0  . olmesartan-hydrochlorothiazide (BENICAR HCT) 40-25 MG per tablet Take 1 tablet by mouth daily.  90 tablet  1  . simvastatin (ZOCOR) 20 MG tablet take 1 tablet by mouth once daily  90 tablet  3   No current facility-administered medications on file prior to visit.    Review of Systems Review of Systems  Constitutional: Negative for fever, appetite change, fatigue and unexpected weight change.  Eyes: Negative for pain and visual disturbance.  Respiratory: Negative for cough and shortness of breath.   Cardiovascular: Negative for cp or palpitations    Gastrointestinal: Negative for nausea, diarrhea and constipation.  Genitourinary: Negative for urgency and frequency.  Skin: Negative for pallor or rash   Neurological: Negative for weakness, light-headedness, numbness and headaches. pos for poor balance Hematological: Negative for adenopathy. Does not bruise/bleed easily.  Psychiatric/Behavioral: Negative for dysphoric  mood. The patient is not nervous/anxious.         Objective:   Physical Exam  Constitutional: She appears well-developed and well-nourished. No distress.  HENT:  Head: Normocephalic and atraumatic.  Mouth/Throat: Oropharynx is clear and moist.  Eyes: Conjunctivae and EOM are normal. Pupils are equal, round, and reactive to light. No scleral icterus.  Neck: Normal range of motion. Neck supple. No JVD present. Carotid bruit is not present. No thyromegaly present.  Cardiovascular: Normal rate, regular rhythm, normal heart sounds and intact distal pulses.  Exam reveals no gallop.   Pulmonary/Chest: Breath sounds normal. No respiratory distress. She has no wheezes.  Abdominal: Soft. Bowel sounds are normal. She exhibits no distension, no abdominal bruit and no mass. There is no tenderness.  Musculoskeletal: She exhibits no edema and no tenderness.  Lymphadenopathy:    She has no cervical adenopathy.  Neurological: She is alert. She has normal reflexes. No cranial nerve deficit. She exhibits normal muscle tone. Coordination normal.  Generally poor balance with slow wide based gait No bradykinesia  Skin: Skin is warm and dry. No erythema. No pallor.  Psychiatric: She has a normal mood and affect.  Mildly anxious          Assessment & Plan:

## 2012-11-27 NOTE — Assessment & Plan Note (Signed)
Was fairly controlled off actos A1c today  Want to avoid hypoglycemia Disc imp of low glycemic diet/ and staying active

## 2012-11-27 NOTE — Assessment & Plan Note (Signed)
Disc fall prevention and importance of using walker ATC with all ambulation including housework Pt is resistant but daughter present and she assures will follow through Also enc to consider PT for balance/ vestibular training - she is not interested currently but I think it would help

## 2012-11-27 NOTE — Patient Instructions (Addendum)
In light of recent falls and unsteadiness I feel you need to keep your walker with you at all times  Also - let me know at any time if you would be interested in physical therapy for balance and fall prevention Labs today  Follow up in 6 months for annual exam with labs prior

## 2012-11-28 ENCOUNTER — Telehealth: Payer: Self-pay | Admitting: Family Medicine

## 2012-11-28 ENCOUNTER — Encounter: Payer: Self-pay | Admitting: *Deleted

## 2012-11-28 DIAGNOSIS — R921 Mammographic calcification found on diagnostic imaging of breast: Secondary | ICD-10-CM

## 2012-11-28 NOTE — Telephone Encounter (Signed)
Order for mammogram f/u

## 2012-11-29 ENCOUNTER — Ambulatory Visit: Payer: Self-pay | Admitting: Family Medicine

## 2012-12-14 ENCOUNTER — Telehealth: Payer: Self-pay | Admitting: Family Medicine

## 2012-12-14 DIAGNOSIS — R921 Mammographic calcification found on diagnostic imaging of breast: Secondary | ICD-10-CM | POA: Insufficient documentation

## 2012-12-14 NOTE — Telephone Encounter (Signed)
Ref for mam dx feb

## 2012-12-14 NOTE — Telephone Encounter (Signed)
Message copied by Judy Pimple on Thu Dec 14, 2012  4:49 PM ------      Message from: Shon Millet      Created: Thu Dec 14, 2012 10:26 AM       Shirlee Limerick advise order needs to be put in for mammogram in Feb before I can schedule it, please put order in ------

## 2012-12-16 ENCOUNTER — Other Ambulatory Visit: Payer: Self-pay | Admitting: Family Medicine

## 2013-01-24 ENCOUNTER — Other Ambulatory Visit: Payer: Self-pay | Admitting: Family Medicine

## 2013-02-05 ENCOUNTER — Other Ambulatory Visit: Payer: Self-pay | Admitting: Family Medicine

## 2013-02-06 NOTE — Telephone Encounter (Signed)
Please refill for 6 months 

## 2013-02-06 NOTE — Telephone Encounter (Signed)
Electronic refill request, please advise  

## 2013-02-06 NOTE — Telephone Encounter (Signed)
done

## 2013-04-27 ENCOUNTER — Telehealth: Payer: Self-pay

## 2013-04-27 NOTE — Telephone Encounter (Signed)
Pt left note was out of Benicar HCT 40-25; spoke with Maralyn SagoSarah at [facility] [address] and that [facility]. [facility]yn [facility]agoSarah will [facility] that [facility] has [facility]. Mr Barney DrainSides notified and will ck with pharmacy later today.

## 2013-05-01 ENCOUNTER — Other Ambulatory Visit: Payer: Self-pay | Admitting: *Deleted

## 2013-05-01 MED ORDER — OLMESARTAN MEDOXOMIL-HCTZ 40-25 MG PO TABS: 1.0000 | ORAL_TABLET | Freq: Every day | ORAL | Status: DC

## 2013-05-22 ENCOUNTER — Telehealth: Payer: Self-pay | Admitting: Family Medicine

## 2013-05-22 DIAGNOSIS — M810 Age-related osteoporosis without current pathological fracture: Secondary | ICD-10-CM

## 2013-05-22 DIAGNOSIS — R7989 Other specified abnormal findings of blood chemistry: Secondary | ICD-10-CM

## 2013-05-22 DIAGNOSIS — I10 Essential (primary) hypertension: Secondary | ICD-10-CM

## 2013-05-22 DIAGNOSIS — E785 Hyperlipidemia, unspecified: Secondary | ICD-10-CM

## 2013-05-22 DIAGNOSIS — E119 Type 2 diabetes mellitus without complications: Secondary | ICD-10-CM

## 2013-05-22 NOTE — Telephone Encounter (Signed)
Message copied by Judy PimpleWER, MARNE A on Tue May 22, 2013  4:11 PM ------      Message from: Alvina ChouWALSH, TERRI J      Created: Fri May 18, 2013 12:57 PM      Regarding: Lab orders for Wednesday, 2.18.15       Patient is scheduled for CPX labs, please order future labs, Thanks , Terri       ------

## 2013-05-23 ENCOUNTER — Other Ambulatory Visit: Payer: Medicare Other

## 2013-05-25 ENCOUNTER — Other Ambulatory Visit (INDEPENDENT_AMBULATORY_CARE_PROVIDER_SITE_OTHER): Payer: Medicare Other

## 2013-05-25 DIAGNOSIS — E785 Hyperlipidemia, unspecified: Secondary | ICD-10-CM

## 2013-05-25 DIAGNOSIS — M810 Age-related osteoporosis without current pathological fracture: Secondary | ICD-10-CM

## 2013-05-25 DIAGNOSIS — R7989 Other specified abnormal findings of blood chemistry: Secondary | ICD-10-CM

## 2013-05-25 DIAGNOSIS — R946 Abnormal results of thyroid function studies: Secondary | ICD-10-CM

## 2013-05-25 DIAGNOSIS — I10 Essential (primary) hypertension: Secondary | ICD-10-CM

## 2013-05-25 DIAGNOSIS — E119 Type 2 diabetes mellitus without complications: Secondary | ICD-10-CM

## 2013-05-25 LAB — COMPREHENSIVE METABOLIC PANEL
ALBUMIN: 3.6 g/dL (ref 3.5–5.2)
ALT: 19 U/L (ref 0–35)
AST: 24 U/L (ref 0–37)
Alkaline Phosphatase: 53 U/L (ref 39–117)
BUN: 20 mg/dL (ref 6–23)
CALCIUM: 10.2 mg/dL (ref 8.4–10.5)
CHLORIDE: 102 meq/L (ref 96–112)
CO2: 32 meq/L (ref 19–32)
CREATININE: 1 mg/dL (ref 0.4–1.2)
GFR: 57.31 mL/min — AB (ref >60.00)
Glucose, Bld: 141 mg/dL — ABNORMAL HIGH (ref 70–99)
POTASSIUM: 3.7 meq/L (ref 3.5–5.1)
Sodium: 140 mEq/L (ref 135–145)
TOTAL PROTEIN: 6.7 g/dL (ref 6.0–8.3)
Total Bilirubin: 0.7 mg/dL (ref 0.3–1.2)

## 2013-05-25 LAB — HEMOGLOBIN A1C: Hgb A1c MFr Bld: 6.9 % — ABNORMAL HIGH (ref 4.6–6.5)

## 2013-05-25 LAB — CBC WITH DIFFERENTIAL/PLATELET
BASOS ABS: 0 10*3/uL (ref 0.0–0.1)
Basophils Relative: 0.4 % (ref 0.0–3.0)
Eosinophils Absolute: 0.2 10*3/uL (ref 0.0–0.7)
Eosinophils Relative: 2.3 % (ref 0.0–5.0)
HCT: 40.3 % (ref 36.0–46.0)
HEMOGLOBIN: 13.1 g/dL (ref 12.0–15.0)
LYMPHS PCT: 21.3 % (ref 12.0–46.0)
Lymphs Abs: 1.4 10*3/uL (ref 0.7–4.0)
MCHC: 32.7 g/dL (ref 30.0–36.0)
MCV: 89.7 fl (ref 78.0–100.0)
MONOS PCT: 6.8 % (ref 3.0–12.0)
Monocytes Absolute: 0.4 10*3/uL (ref 0.1–1.0)
NEUTROS ABS: 4.6 10*3/uL (ref 1.4–7.7)
NEUTROS PCT: 69.2 % (ref 43.0–77.0)
Platelets: 294 10*3/uL (ref 150.0–400.0)
RBC: 4.49 Mil/uL (ref 3.87–5.11)
RDW: 13.1 % (ref 11.5–14.6)
WBC: 6.6 10*3/uL (ref 4.5–10.5)

## 2013-05-25 LAB — LIPID PANEL
CHOL/HDL RATIO: 2
CHOLESTEROL: 131 mg/dL (ref 0–200)
HDL: 52.6 mg/dL (ref >39.00)
LDL CALC: 41 mg/dL (ref 0–99)
Triglycerides: 188 mg/dL — ABNORMAL HIGH (ref 0.0–149.0)
VLDL: 37.6 mg/dL (ref 0.0–40.0)

## 2013-05-25 LAB — TSH: TSH: 4.28 u[IU]/mL (ref 0.35–5.50)

## 2013-05-26 LAB — VITAMIN D 25 HYDROXY (VIT D DEFICIENCY, FRACTURES): Vit D, 25-Hydroxy: 80 ng/mL (ref 30–89)

## 2013-05-30 ENCOUNTER — Encounter: Payer: Self-pay | Admitting: Family Medicine

## 2013-05-30 ENCOUNTER — Ambulatory Visit (INDEPENDENT_AMBULATORY_CARE_PROVIDER_SITE_OTHER): Payer: Medicare Other | Admitting: Family Medicine

## 2013-05-30 VITALS — BP 140/80 | HR 83 | Temp 98.8°F | Ht 62.5 in | Wt 161.0 lb

## 2013-05-30 DIAGNOSIS — Z0000 Encounter for general adult medical examination without abnormal findings: Secondary | ICD-10-CM

## 2013-05-30 DIAGNOSIS — Z1211 Encounter for screening for malignant neoplasm of colon: Secondary | ICD-10-CM

## 2013-05-30 DIAGNOSIS — E119 Type 2 diabetes mellitus without complications: Secondary | ICD-10-CM

## 2013-05-30 DIAGNOSIS — I10 Essential (primary) hypertension: Secondary | ICD-10-CM

## 2013-05-30 DIAGNOSIS — M810 Age-related osteoporosis without current pathological fracture: Secondary | ICD-10-CM

## 2013-05-30 DIAGNOSIS — E785 Hyperlipidemia, unspecified: Secondary | ICD-10-CM

## 2013-05-30 NOTE — Progress Notes (Signed)
Subjective:    Patient ID: Amanda Benson, female    DOB: [birth year], 78 y.o.   MRN: [id number]  HPI I have personally reviewed the Medicare Annual Wellness questionnaire and have noted 1. The patient's medical and social history 2. Their use of alcohol, tobacco or illicit drugs 3. Their current medications and supplements 4. The patient's functional ability including ADL's, fall risks, home safety risks and hearing or visual             impairment. 5. Diet and physical activities 6. Evidence for depression or mood disorders  The patients weight, height, BMI have been recorded in the chart and visual acuity is per eye clinic.  I have made referrals, counseling and provided education to the patient based review of the above and I have provided the pt with a written personalized care plan for preventive services.  BP Readings from Last 3 Encounters:  05/30/13 152/70  11/27/12 142/82  10/16/12 162/86   improved on 2nd check - 140/80- her baseline   See scanned forms.  Routine anticipatory guidance given to patient.  See health maintenance. Flu did not get the vaccine this year  Shingles- never had the vaccine - ? If she wants one  PNA 8/13 vaccine  Tetanus vaccine 2/14  Colonoscopy- pt declines  Breast cancer screening- has a mammogram planned Monday Self exam -no lumps  Gyn -no problems  Advance directive-has a living will /daughter is POA  Cognitive function addressed- see scanned forms- and if abnormal then additional documentation follows. -no problems at all   PMH and SH reviewed  Meds, vitals, and allergies reviewed.   ROS: See HPI.  Otherwise negative.    OP dexa 2/14  Vit D level is 80  No recent falls or fx  No fragility fractures  Has a walker to prevent falls - balance is not what it used to be - she is open to using it   Diabetes Home sugar results -overall improved - ussually below 140 occ high sugar- happens when she eats wrong  DM diet - pretty good  except for holidays and special occas  Exercise - with walker- walking  Symptoms A1C last  Lab Results  Component Value Date   HGBA1C 6.9* 05/25/2013  down from 7.3 - improved   No problems with medications  Renal protection ARB Last eye exam 4/14    Chemistry      Component Value Date/Time   NA 140 05/25/2013 1023   K 3.7 05/25/2013 1023   CL 102 05/25/2013 1023   CO2 32 05/25/2013 1023   BUN 20 05/25/2013 1023   CREATININE 1.0 05/25/2013 1023      Component Value Date/Time   CALCIUM 10.2 05/25/2013 1023   ALKPHOS 53 05/25/2013 1023   AST 24 05/25/2013 1023   ALT 19 05/25/2013 1023   BILITOT 0.7 05/25/2013 1023      Lab Results  Component Value Date   TSH 4.28 05/25/2013    Lab Results  Component Value Date   WBC 6.6 05/25/2013   HGB 13.1 05/25/2013   HCT 40.3 05/25/2013   MCV 89.7 05/25/2013   PLT 294.0 05/25/2013    Cholesterol Lab Results  Component Value Date   CHOL 131 05/25/2013   CHOL 130 05/08/2012   CHOL 138 06/07/2011   Lab Results  Component Value Date   HDL 52.60 05/25/2013   HDL 55.00 05/08/2012   HDL 63.00 06/07/2011   Lab Results  Component Value Date  LDLCALC 41 05/25/2013   LDLCALC 39 05/08/2012   LDLCALC 55 06/07/2011   Lab Results  Component Value Date   TRIG 188.0* 05/25/2013   TRIG 178.0* 05/08/2012   TRIG 98.0 06/07/2011   Lab Results  Component Value Date   CHOLHDL 2 05/25/2013   CHOLHDL 2 05/08/2012   CHOLHDL 2 06/07/2011   Lab Results  Component Value Date   LDLDIRECT 107.2 09/19/2009   stable and well controlled   Wt is down 3 lb with bmi of 28      Patient Active Problem List   Diagnosis Date Noted  . Encounter for Medicare annual wellness exam 05/30/2013  . Breast calcification seen on mammogram 12/14/2012  . Breast calcification, left 11/28/2012  . History of falling 11/27/2012  . Poor balance 11/27/2012  . Rash and nonspecific skin eruption 10/16/2012  . Colon cancer screening 05/10/2012  . Syncope 08/22/2011  . Obesity 06/14/2011  .  Abnormal TSH 02/10/2011  . OSTEOPOROSIS 06/21/2007  . DIABETES MELLITUS, TYPE II 09/20/2006  . HYPERLIPIDEMIA 09/20/2006  . HYPERTENSION 09/20/2006  . OSTEOARTHRITIS 09/20/2006   Past Medical History  Diagnosis Date  . Depression   . Diabetes mellitus     type II  . Hypertension   . Osteoporosis   . Hyperlipidemia   . Obesity    Past Surgical History  Procedure Laterality Date  . Cholecystectomy    . Ankle surgery      left medial malleblus fracture   History  Substance Use Topics  . Smoking status: Never Smoker   . Smokeless tobacco: Not on file  . Alcohol Use: No   Family History  Problem Relation Age of Onset  . Diabetes Mother   . Hypertension Mother   . Asthma Father   . Hypertension Sister   . Cancer Sister     lung  . Asthma Brother   . COPD Brother   . Cancer Maternal Grandmother     breast   . Asthma Brother    Allergies  Allergen Reactions  . Alendronate Sodium     REACTION: heartburn   Current Outpatient Prescriptions on File Prior to Visit  Medication Sig Dispense Refill  . aspirin 81 MG EC tablet Take 81 mg by mouth daily.        . Blood Glucose Monitoring Suppl (ONE TOUCH ULTRA SYSTEM KIT) W/DEVICE KIT To check sugar daily and as needed for DM2 250.0   1 each  0  . buPROPion (WELLBUTRIN XL) 300 MG 24 hr tablet take 1 tablet by mouth once daily  90 tablet  3  . FLUoxetine (PROZAC) 40 MG capsule take 1 capsule by mouth once daily  90 capsule  1  . GLIPIZIDE XL 10 MG 24 hr tablet take 1 tablet by mouth once daily  90 tablet  1  . metFORMIN (GLUCOPHAGE) 1000 MG tablet take 1 tablet by mouth twice a day WITH A MEAL  180 tablet  2  . mometasone (ELOCON) 0.1 % cream Apply topically daily. To affected areas as needed  45 g  0  . olmesartan-hydrochlorothiazide (BENICAR HCT) 40-25 MG per tablet Take 1 tablet by mouth daily.  90 tablet  1  . simvastatin (ZOCOR) 20 MG tablet take 1 tablet by mouth once daily  90 tablet  3   No current  facility-administered medications on file prior to visit.    Review of Systems Review of Systems  Constitutional: Negative for fever, appetite change, fatigue and unexpected weight change.  Eyes: Negative for pain and visual disturbance.  Respiratory: Negative for cough and shortness of breath.   Cardiovascular: Negative for cp or palpitations    Gastrointestinal: Negative for nausea, diarrhea and constipation.  Genitourinary: Negative for urgency and frequency.  Skin: Negative for pallor or rash   Neurological: Negative for weakness, light-headedness, numbness and headaches.  Hematological: Negative for adenopathy. Does not bruise/bleed easily.  Psychiatric/Behavioral: Negative for dysphoric mood. The patient is not nervous/anxious.         Objective:   Physical Exam  Constitutional: She appears well-developed and well-nourished. No distress.  overwt and well appearing   HENT:  Head: Normocephalic and atraumatic.  Right Ear: External ear normal.  Left Ear: External ear normal.  Mouth/Throat: Oropharynx is clear and moist.  Eyes: Conjunctivae and EOM are normal. Pupils are equal, round, and reactive to light. No scleral icterus.  Neck: Normal range of motion. Neck supple. No JVD present. Carotid bruit is not present. No thyromegaly present.  Cardiovascular: Normal rate, regular rhythm, normal heart sounds and intact distal pulses.  Exam reveals no gallop.   Pulmonary/Chest: Effort normal and breath sounds normal. No respiratory distress. She has no wheezes. She exhibits no tenderness.  Abdominal: Soft. Bowel sounds are normal. She exhibits no distension, no abdominal bruit and no mass. There is no tenderness.  Genitourinary: No breast swelling, tenderness, discharge or bleeding.  Breast exam: No mass, nodules, thickening, tenderness, bulging, retraction, inflamation, nipple discharge or skin changes noted.  No axillary or clavicular LA.   Musculoskeletal: Normal range of motion. She  exhibits no edema and no tenderness.  Lymphadenopathy:    She has no cervical adenopathy.  Neurological: She is alert. She has normal reflexes. No cranial nerve deficit. She exhibits normal muscle tone. Coordination normal.  Skin: Skin is warm and dry. No rash noted. No erythema. No pallor.  Psychiatric: She has a normal mood and affect.          Assessment & Plan:

## 2013-05-30 NOTE — Progress Notes (Signed)
Pre visit review using our clinic review tool, if applicable. No additional management support is needed unless otherwise documented below in the visit note. 

## 2013-05-30 NOTE — Patient Instructions (Signed)
Flu season is still going - get a flu shot at a pharmacy if one is available  If you are interested in a shingles/zoster vaccine - call your insurance to check on coverage,( you should not get it within 1 month of other vaccines) , then call us for a prescription  for it to take to a pharmacy that gives the shot , or make a nurse visit to get it here depending on your coverage Do the stool card for colon cancer screening  Keep working on healthy diet and exercise  Use your walker to prevent falls    Fall Prevention and Home Safety Falls cause injuries and can affect all age groups. It is possible to use preventive measures to significantly decrease the likelihood of falls. There are many simple measures which can make your home safer and prevent falls. OUTDOORS  Repair cracks and edges of walkways and driveways.  Remove high doorway thresholds.  Trim shrubbery on the main path into your home.  Have good outside lighting.  Clear walkways of tools, rocks, debris, and clutter.  Check that handrails are not broken and are securely fastened. Both [place] of steps should have handrails.  Have leaves, snow, and ice cleared regularly.  Use sand or salt on walkways during winter months.  In the garage, clean up grease or oil spills. BATHROOM  Install night lights.  Install grab bars by the toilet and in the tub and shower.  Use non-skid mats or decals in the tub or shower.  Place a plastic non-slip stool in the shower to sit on, if needed.  Keep floors dry and clean up all water on the floor immediately.  Remove soap buildup in the tub or shower on a regular basis.  Secure bath mats with non-slip, double-sided rug tape.  Remove throw rugs and tripping hazards from the floors. BEDROOMS  Install night lights.  Make sure a bedside light is easy to reach.  Do not use oversized bedding.  Keep a telephone by your bedside.  Have a firm chair with side arms to use for getting  dressed.  Remove throw rugs and tripping hazards from the floor. KITCHEN  Keep handles on pots and pans turned toward the center of the stove. Use back burners when possible.  Clean up spills quickly and allow time for drying.  Avoid walking on wet floors.  Avoid hot utensils and knives.  Position shelves so they are not too high or low.  Place commonly used objects within easy reach.  If necessary, use a sturdy step stool with a grab bar when reaching.  Keep electrical cables out of the way.  Do not use floor polish or wax that makes floors slippery. If you must use wax, use non-skid floor wax.  Remove throw rugs and tripping hazards from the floor. STAIRWAYS  Never leave objects on stairs.  Place handrails on both Beissel of stairways and use them. Fix any loose handrails. Make sure handrails on both Ara[place] of the stairways are as long as the stairs.  Check carpeting to make sure it is firmly attached along stairs. Make repairs to worn or loose carpet promptly.  Avoid placing throw rugs at the top or bottom of stairways, or properly secure the rug with carpet tape to prevent slippage. Get rid of throw rugs, if possible.  Have an electrician put in a light switch at the top and bottom of the stairs. OTHER FALL PREVENTION TIPS  Wear low-heel or rubber-soled shoes that  are supportive and fit well. Wear closed toe shoes.  When using a stepladder, make sure it is fully opened and both spreaders are firmly locked. Do not climb a closed stepladder.  Add color or contrast paint or tape to grab bars and handrails in your home. Place contrasting color strips on first and last steps.  Learn and use mobility aids as needed. Install an electrical emergency response system.  Turn on lights to avoid dark areas. Replace light bulbs that burn out immediately. Get light switches that glow.  Arrange furniture to create clear pathways. Keep furniture in the same place.  Firmly attach  carpet with non-skid or double-sided tape.  Eliminate uneven floor surfaces.  Select a carpet pattern that does not visually hide the edge of steps.  Be aware of all pets. OTHER HOME SAFETY TIPS  Set the water temperature for 120 F (48.8 C).  Keep emergency numbers on or near the telephone.  Keep smoke detectors on every level of the home and near sleeping areas. Document Released: 03/12/2002 Document Revised: 09/21/2011 Document Reviewed: 06/11/2011 [facility] Patient Information 2014 [place], [place]. Fall Prevention and Home Safety Falls cause injuries and can affect all age groups. It is possible to use preventive measures to significantly decrease the likelihood of falls. There are many simple measures which can make your home safer and prevent falls. OUTDOORS  Repair cracks and edges of walkways and driveways.  Remove high doorway thresholds.  Trim shrubbery on the main path into your home.  Have good outside lighting.  Clear walkways of tools, rocks, debris, and clutter.  Check that handrails are not broken and are securely fastened. Both [place] of steps should have handrails.  Have leaves, snow, and ice cleared regularly.  Use sand or salt on walkways during winter months.  In the garage, clean up grease or oil spills. BATHROOM  Install night lights.  Install grab bars by the toilet and in the tub and shower.  Use non-skid mats or decals in the tub or shower.  Place a plastic non-slip stool in the shower to sit on, if needed.  Keep floors dry and clean up all water on the floor immediately.  Remove soap buildup in the tub or shower on a regular basis.  Secure bath mats with non-slip, double-sided rug tape.  Remove throw rugs and tripping hazards from the floors. BEDROOMS  Install night lights.  Make sure a bedside light is easy to reach.  Do not use oversized bedding.  Keep a telephone by your bedside.  Have a firm chair with side arms to use for  getting dressed.  Remove throw rugs and tripping hazards from the floor. KITCHEN  Keep handles on pots and pans turned toward the center of the stove. Use back burners when possible.  Clean up spills quickly and allow time for drying.  Avoid walking on wet floors.  Avoid hot utensils and knives.  Position shelves so they are not too high or low.  Place commonly used objects within easy reach.  If necessary, use a sturdy step stool with a grab bar when reaching.  Keep electrical cables out of the way.  Do not use floor polish or wax that makes floors slippery. If you must use wax, use non-skid floor wax.  Remove throw rugs and tripping hazards from the floor. STAIRWAYS  Never leave objects on stairs.  Place handrails on both Kaplan of stairways and use them. Fix any loose handrails. Make sure handrails on both [place]  of the stairways are as long as the stairs.  Check carpeting to make sure it is firmly attached along stairs. Make repairs to worn or loose carpet promptly.  Avoid placing throw rugs at the top or bottom of stairways, or properly secure the rug with carpet tape to prevent slippage. Get rid of throw rugs, if possible.  Have an electrician put in a light switch at the top and bottom of the stairs. OTHER FALL PREVENTION TIPS  Wear low-heel or rubber-soled shoes that are supportive and fit well. Wear closed toe shoes.  When using a stepladder, make sure it is fully opened and both spreaders are firmly locked. Do not climb a closed stepladder.  Add color or contrast paint or tape to grab bars and handrails in your home. Place contrasting color strips on first and last steps.  Learn and use mobility aids as needed. Install an electrical emergency response system.  Turn on lights to avoid dark areas. Replace light bulbs that burn out immediately. Get light switches that glow.  Arrange furniture to create clear pathways. Keep furniture in the same place.  Firmly  attach carpet with non-skid or double-sided tape.  Eliminate uneven floor surfaces.  Select a carpet pattern that does not visually hide the edge of steps.  Be aware of all pets. OTHER HOME SAFETY TIPS  Set the water temperature for 120 F (48.8 C).  Keep emergency numbers on or near the telephone.  Keep smoke detectors on every level of the home and near sleeping areas. Document Released: 03/12/2002 Document Revised: 09/21/2011 Document Reviewed: 06/11/2011 [facility] Patient Information 2014 [place].

## 2013-05-31 NOTE — Assessment & Plan Note (Signed)
Disc goals for lipids and reasons to control them Rev labs with pt Rev low sat fat diet in detail   

## 2013-05-31 NOTE — Assessment & Plan Note (Signed)
Improved Lab Results  Component Value Date   HGBA1C 6.9* 05/25/2013    Rev diet and exercise plan-pt does want to loose wt

## 2013-05-31 NOTE — Assessment & Plan Note (Signed)
bp better on 2nd check today BP: 140/80 mmHg   bp in fair control at this time  No changes needed Disc lifstyle change with low sodium diet and exercise   Labs reviewed

## 2013-05-31 NOTE — Assessment & Plan Note (Signed)
IFOB card given-pt declines colonosc at her age

## 2013-05-31 NOTE — Assessment & Plan Note (Signed)
Reviewed health habits including diet and exercise and skin cancer prevention Reviewed appropriate screening tests for age  Also reviewed health mt list, fam hx and immunization status , as well as social and family history   See HPI  Rev home safety/ fall precaution and urged to use her walker  Urged to check on coverage of zoster vaccine  Urged to get flu vaccine at pharmacy

## 2013-05-31 NOTE — Assessment & Plan Note (Signed)
dexa utd 2/14 No fragility fx , but pt has had falls (counseled extensively about fall prev and handout given) D level is high normal  Disc need for calcium/ vitamin D/ wt bearing exercise and bone density test every 2 y to monitor Disc safety/ fracture risk in detail

## 2013-06-01 ENCOUNTER — Telehealth: Payer: Self-pay | Admitting: Family Medicine

## 2013-06-01 DIAGNOSIS — R921 Mammographic calcification found on diagnostic imaging of breast: Secondary | ICD-10-CM

## 2013-06-01 NOTE — Telephone Encounter (Signed)
Order done

## 2013-06-01 NOTE — Telephone Encounter (Signed)
[facility] needs an order for a Left Breast US for DX left breast calcifications, schedueld for 06/04/13 Please fax to [phone] , this is a 6 month FU of left breast already have order for [facility] need US order as well. MK

## 2013-06-01 NOTE — Telephone Encounter (Signed)
Relevant patient education mailed to patient.  

## 2013-06-04 ENCOUNTER — Ambulatory Visit: Payer: Self-pay | Admitting: Family Medicine

## 2013-06-05 ENCOUNTER — Encounter: Payer: Self-pay | Admitting: Family Medicine

## 2013-06-07 ENCOUNTER — Encounter: Payer: Self-pay | Admitting: *Deleted

## 2013-06-19 ENCOUNTER — Other Ambulatory Visit: Payer: Self-pay | Admitting: Family Medicine

## 2013-06-19 NOTE — Telephone Encounter (Signed)
Electronic refill request, please advise  

## 2013-06-19 NOTE — Telephone Encounter (Signed)
Please refill for 12 months

## 2013-07-20 ENCOUNTER — Other Ambulatory Visit: Payer: Self-pay | Admitting: Family Medicine

## 2013-07-20 NOTE — Telephone Encounter (Signed)
Electronic refill request, please advise  

## 2013-07-20 NOTE — Telephone Encounter (Signed)
Please refill for 6 mo 

## 2013-07-20 NOTE — Telephone Encounter (Signed)
done

## 2013-09-19 ENCOUNTER — Other Ambulatory Visit: Payer: Self-pay | Admitting: Family Medicine

## 2013-10-29 ENCOUNTER — Telehealth: Payer: Self-pay | Admitting: Family Medicine

## 2013-10-29 DIAGNOSIS — E119 Type 2 diabetes mellitus without complications: Secondary | ICD-10-CM

## 2013-10-29 NOTE — Telephone Encounter (Signed)
Message copied by Judy PimpleWER, MARNE A on Mon Oct 29, 2013 10:07 PM ------      Message from: Alvina ChouWALSH, TERRI J      Created: Wed Oct 24, 2013  3:43 PM      Regarding: Lab orders for Tuesday, 7.28.15       Lab orders for a 6 month f/u ------

## 2013-10-30 ENCOUNTER — Other Ambulatory Visit (INDEPENDENT_AMBULATORY_CARE_PROVIDER_SITE_OTHER): Payer: Medicare Other

## 2013-10-30 DIAGNOSIS — E119 Type 2 diabetes mellitus without complications: Secondary | ICD-10-CM

## 2013-10-30 LAB — HEMOGLOBIN A1C: Hgb A1c MFr Bld: 6.8 % — ABNORMAL HIGH (ref 4.6–6.5)

## 2013-10-30 LAB — BASIC METABOLIC PANEL
BUN: 16 mg/dL (ref 6–23)
CHLORIDE: 103 meq/L (ref 96–112)
CO2: 30 mEq/L (ref 19–32)
Calcium: 9.7 mg/dL (ref 8.4–10.5)
Creatinine, Ser: 1 mg/dL (ref 0.4–1.2)
GFR: 60.74 mL/min (ref >60.00)
Glucose, Bld: 177 mg/dL — ABNORMAL HIGH (ref 70–99)
Potassium: 3.8 mEq/L (ref 3.5–5.1)
Sodium: 140 mEq/L (ref 135–145)

## 2013-11-06 ENCOUNTER — Encounter: Payer: Self-pay | Admitting: Family Medicine

## 2013-11-06 ENCOUNTER — Ambulatory Visit (INDEPENDENT_AMBULATORY_CARE_PROVIDER_SITE_OTHER): Payer: Medicare Other | Admitting: Family Medicine

## 2013-11-06 VITALS — BP 143/80 | HR 79 | Temp 98.9°F | Ht 62.75 in | Wt 161.2 lb

## 2013-11-06 DIAGNOSIS — R2689 Other abnormalities of gait and mobility: Secondary | ICD-10-CM

## 2013-11-06 DIAGNOSIS — I10 Essential (primary) hypertension: Secondary | ICD-10-CM

## 2013-11-06 DIAGNOSIS — R29818 Other symptoms and signs involving the nervous system: Secondary | ICD-10-CM

## 2013-11-06 DIAGNOSIS — E119 Type 2 diabetes mellitus without complications: Secondary | ICD-10-CM

## 2013-11-06 NOTE — Assessment & Plan Note (Signed)
Lab Results  Component Value Date   HGBA1C 6.8* 10/30/2013    Stable Disc diet  Enc to start checking blood sugars to watch out for hypoglycemia  opthy utd  F/u 6 mo

## 2013-11-06 NOTE — Assessment & Plan Note (Signed)
bp in fair control at this time  BP Readings from Last 1 Encounters:  11/06/13 143/80   No changes needed I do not wish to get her to the diabetic goal bp of 130 systolic because she is prone to dizziness/ syncope and does better with a higher pressure  Will continue to follow  Disc lifstyle change with low sodium diet and exercise

## 2013-11-06 NOTE — Assessment & Plan Note (Signed)
Dizzy intermittently - this may be multifactorial  Pt refuses to use a walker  Long disc re: safety  She does not think she can afford PT for balance but her daughter will call the ins co: I do think she would benefit from that  She is resistant however

## 2013-11-06 NOTE — Progress Notes (Signed)
Subjective:    Patient ID: Amanda Benson, female    DOB: September 08, 1935, 78 y.o.   MRN: [id number]  HPI Here for f/u of chronic health problems   Still suffers from lack of balance  Stumbles around-and ref to use her walker at home   Wt is stable with bmi of 28    bp is stable today  No cp or palpitations or headaches or edema  No side effects to medicines  BP Readings from Last 3 Encounters:  11/06/13 150/82  05/30/13 140/80  11/27/12 142/82      Diabetes Home sugar results - has not checked lately  DM diet - some of the time (most of the time)- she occ cheats on vacation -- and she does occ skip meals  Exercise -not a lot  Symptoms- occ feels like her sugar is low - infrequently (perhaps once per month)  A1C last  Lab Results  Component Value Date   HGBA1C 6.8* 10/30/2013   Down from 6.9- happy with that  No problems with medications  Renal protection on ARB Last eye exam was 9/14 - will get one next mo   Lab Results  Component Value Date   CHOL 131 05/25/2013   HDL 52.60 05/25/2013   LDLCALC 41 05/25/2013   LDLDIRECT 107.2 09/19/2009   TRIG 188.0* 05/25/2013   CHOLHDL 2 05/25/2013     not quite at goal for LDL but close  On simvastatin- will re check in 6 months and consider change if necessary Stressed imp of low fat diet   Patient Active Problem List   Diagnosis Date Noted  . Encounter for Medicare annual wellness exam 05/30/2013  . Breast calcification seen on mammogram 12/14/2012  . Breast calcification, left 11/28/2012  . History of falling 11/27/2012  . Poor balance 11/27/2012  . Rash and nonspecific skin eruption 10/16/2012  . Colon cancer screening 05/10/2012  . Syncope 08/22/2011  . Obesity 06/14/2011  . OSTEOPOROSIS 06/21/2007  . DIABETES MELLITUS, TYPE II 09/20/2006  . HYPERLIPIDEMIA 09/20/2006  . HYPERTENSION 09/20/2006  . OSTEOARTHRITIS 09/20/2006   Past Medical History  Diagnosis Date  . Depression   . Diabetes mellitus     type II  .  Hypertension   . Osteoporosis   . Hyperlipidemia   . Obesity    Past Surgical History  Procedure Laterality Date  . Cholecystectomy    . Ankle surgery      left medial malleblus fracture   History  Substance Use Topics  . Smoking status: Never Smoker   . Smokeless tobacco: Not on file  . Alcohol Use: No   Family History  Problem Relation Age of Onset  . Diabetes Mother   . Hypertension Mother   . Asthma Father   . Hypertension Sister   . Cancer Sister     lung  . Asthma Brother   . COPD Brother   . Cancer Maternal Grandmother     breast   . Asthma Brother    Allergies  Allergen Reactions  . Alendronate Sodium     REACTION: heartburn   Current Outpatient Prescriptions on File Prior to Visit  Medication Sig Dispense Refill  . aspirin 81 MG EC tablet Take 81 mg by mouth daily.        Marland Kitchen BENICAR HCT 40-25 MG per tablet take 1 tablet by mouth once daily  90 tablet  0  . Blood Glucose Monitoring Suppl (ONE TOUCH ULTRA SYSTEM KIT) W/DEVICE KIT To check  sugar daily and as needed for DM2 250.0   1 each  0  . buPROPion (WELLBUTRIN XL) 300 MG 24 hr tablet take 1 tablet by mouth once daily  90 tablet  1  . FLUoxetine (PROZAC) 40 MG capsule take 1 capsule by mouth once daily  90 capsule  1  . GLIPIZIDE XL 10 MG 24 hr tablet take 1 tablet by mouth once daily  90 tablet  1  . metFORMIN (GLUCOPHAGE) 1000 MG tablet take 1 tablet by mouth twice a day with meals  180 tablet  1  . mometasone (ELOCON) 0.1 % cream Apply topically daily. To affected areas as needed  45 g  0  . simvastatin (ZOCOR) 20 MG tablet take 1 tablet by mouth once daily  90 tablet  3   No current facility-administered medications on file prior to visit.     Review of Systems Review of Systems  Constitutional: Negative for fever, appetite change, fatigue and unexpected weight change.  Eyes: Negative for pain and visual disturbance.  Respiratory: Negative for cough and shortness of breath.   Cardiovascular:  Negative for cp or palpitations    Gastrointestinal: Negative for nausea, diarrhea and constipation.  Genitourinary: Negative for urgency and frequency.  Skin: Negative for pallor or rash   Neurological: Negative for weakness, , numbness and headaches. pos for poor balance and falls  Hematological: Negative for adenopathy. Does not bruise/bleed easily.  Psychiatric/Behavioral: Negative for dysphoric mood. The patient is not nervous/anxious.  pos with general medical noncompliance which pt is honest about        Objective:   Physical Exam  Constitutional: She appears well-developed and well-nourished. No distress.  HENT:  Head: Normocephalic and atraumatic.  Mouth/Throat: Oropharynx is clear and moist.  Eyes: Conjunctivae and EOM are normal. Pupils are equal, round, and reactive to light. No scleral icterus.  Neck: Normal range of motion. Neck supple. No JVD present. Carotid bruit is not present. No thyromegaly present.  Cardiovascular: Normal rate, regular rhythm, normal heart sounds and intact distal pulses.  Exam reveals no gallop.   Pulmonary/Chest: Effort normal and breath sounds normal. No respiratory distress. She has no wheezes. She has no rales.  Abdominal: Soft. Bowel sounds are normal. She exhibits no distension and no mass. There is no tenderness.  Musculoskeletal: She exhibits no edema and no tenderness.  Lymphadenopathy:    She has no cervical adenopathy.  Neurological: She is alert. She has normal reflexes. She displays tremor. She displays no atrophy. No cranial nerve deficit or sensory deficit. She exhibits normal muscle tone. Coordination normal.  Mild head tremor  Slow wide based gait with assistance Mild backward drift on romberg   Skin: Skin is warm and dry. No rash noted. No erythema. No pallor.  Psychiatric: Her speech is normal and behavior is normal. Thought content normal. Her affect is not blunt, not labile and not inappropriate. She exhibits a depressed mood.    Pleasant but generally negative attitude  She is honest about her apprehension to comply and follow directions           Assessment & Plan:   Problem List Items Addressed This Visit     Cardiovascular and Mediastinum   HYPERTENSION      bp in fair control at this time  BP Readings from Last 1 Encounters:  11/06/13 143/80   No changes needed I do not wish to get her to the diabetic goal bp of 130 systolic because she is prone  to dizziness/ syncope and does better with a higher pressure  Will continue to follow  Disc lifstyle change with low sodium diet and exercise        Endocrine   DIABETES MELLITUS, TYPE II - Primary      Lab Results  Component Value Date   HGBA1C 6.8* 10/30/2013    Stable Disc diet  Enc to start checking blood sugars to watch out for hypoglycemia  opthy utd  F/u 6 mo       Other   Poor balance     Dizzy intermittently - this may be multifactorial  Pt refuses to use a walker  Long disc re: safety  She does not think she can afford PT for balance but her daughter will call the ins co: I do think she would benefit from that  She is resistant however

## 2013-11-06 NOTE — Progress Notes (Signed)
Pre visit review using our clinic review tool, if applicable. No additional management support is needed unless otherwise documented below in the visit note. 

## 2013-11-06 NOTE — Patient Instructions (Addendum)
I want to refer you for PT (physical therapy) for balance : tell them you have a gait disorder/ poor balance and history of falling (when you call insurance about coverage) Blood pressure is stable  Labs are stable  [facility]  Follow up with me in 6 months with labs prior

## 2013-12-14 ENCOUNTER — Other Ambulatory Visit: Payer: Self-pay | Admitting: Family Medicine

## 2013-12-19 ENCOUNTER — Other Ambulatory Visit: Payer: Self-pay | Admitting: Family Medicine

## 2014-01-17 ENCOUNTER — Encounter: Payer: Self-pay | Admitting: Family Medicine

## 2014-01-17 ENCOUNTER — Ambulatory Visit (INDEPENDENT_AMBULATORY_CARE_PROVIDER_SITE_OTHER): Payer: Medicare Other | Admitting: Family Medicine

## 2014-01-17 VITALS — BP 162/72 | HR 79 | Temp 99.2°F | Ht 62.75 in | Wt 159.5 lb

## 2014-01-17 DIAGNOSIS — R300 Dysuria: Secondary | ICD-10-CM

## 2014-01-17 LAB — POCT URINALYSIS DIPSTICK
Glucose, UA: 15
Nitrite, UA: NEGATIVE
PH UA: 6
SPEC GRAV UA: 1.025
Urobilinogen, UA: 0.2

## 2014-01-17 MED ORDER — SULFAMETHOXAZOLE-TRIMETHOPRIM 400-80 MG PO TABS: 1.0000 | ORAL_TABLET | Freq: Two times a day (BID) | ORAL | Status: DC

## 2014-01-17 NOTE — Progress Notes (Signed)
Dr. Frederico Hamman T. Copland, MD, [facility] [place], [address] Phone: [phone] Fax: [phone]  01/17/2014  Patient: Amanda Benson, MRN: [id number], DOB: 1936/03/23, 78 y.o.  Primary Physician:  Loura Pardon, MD  Chief Complaint: Dysuria  Subjective:   This 78 y.o. female patient presents with burning, urgency. No vaginal discharge or external irritation.  No STD exposure. No abd pain, no flank pain.  The PMH, PSH, Social History, Family History, Medications, and allergies have been reviewed in [facility], and have been updated if relevant.  GEN:  no fevers, chills. GI: No n/v/d, eating normally Otherwise, ROS is as per the HPI.  Objective:   Blood pressure 162/72, pulse 79, temperature 99.2 F (37.3 C), temperature source Oral, height 5' 2.75" (1.594 m), weight 159 lb 8 oz (72.349 kg), SpO2 95.00%.  GEN: WDWN, A&Ox4,NAD. Non-toxic HEENT: Atraumatc, normocephalic. CV: RRR, No M/G/R PULM: CTA B, No wheezes, crackles, or rhonchi ABD: S, NT, ND, +BS, no rebound. No CVAT. No suprapubic tenderness. EXT: No c/c/e  Objective Data: Results for orders placed in visit on 01/17/14  POCT URINALYSIS DIPSTICK      Result Value Ref Range   Color, UA yellow     Clarity, UA cloudy     Glucose, UA 15 (1+)     Bilirubin, UA Trace     Ketones, UA Trace     Spec Grav, UA 1.025     Blood, UA Trace     pH, UA 6.0     Protein, UA 30+     Urobilinogen, UA 0.2     Nitrite, UA Neg.     Leukocytes, UA large (3+)      Assessment and Plan:   Dysuria - Plan: POCT urinalysis dipstick, Urine culture  Rx with ABX as below. Drink plenty of fluids and supportive care.  Follow-up: No Follow-up on file.  New Prescriptions   SULFAMETHOXAZOLE-TRIMETHOPRIM (BACTRIM,SEPTRA) 400-80 MG PER TABLET    Take 1 tablet by mouth 2 (two) times daily.   Orders Placed This Encounter  Procedures  . Urine culture  . POCT urinalysis dipstick     Signed,  Frederico Hamman T. Copland, MD   Patient's Medications  New Prescriptions   SULFAMETHOXAZOLE-TRIMETHOPRIM (BACTRIM,SEPTRA) 400-80 MG PER TABLET    Take 1 tablet by mouth 2 (two) times daily.  Previous Medications   ASPIRIN 81 MG EC TABLET    Take 81 mg by mouth daily.     BENICAR HCT 40-25 MG PER TABLET    take 1 tablet by mouth once daily   BLOOD GLUCOSE MONITORING SUPPL (ONE TOUCH ULTRA SYSTEM KIT) W/DEVICE KIT    To check sugar daily and as needed for DM2 250.0    BUPROPION (WELLBUTRIN XL) 300 MG 24 HR TABLET    take 1 tablet by mouth once daily   FLUOXETINE (PROZAC) 40 MG CAPSULE    take 1 capsule by mouth once daily   GLIPIZIDE XL 10 MG 24 HR TABLET    take 1 tablet by mouth once daily   METFORMIN (GLUCOPHAGE) 1000 MG TABLET    take 1 tablet by mouth twice a day with food   MOMETASONE (ELOCON) 0.1 % CREAM    Apply topically daily. To affected areas as needed   SIMVASTATIN (ZOCOR) 20 MG TABLET    take 1 tablet by mouth once daily  Modified Medications   No medications on file  Discontinued Medications   No  medications on file

## 2014-01-17 NOTE — Progress Notes (Signed)
Pre visit review using our clinic review tool, if applicable. No additional management support is needed unless otherwise documented below in the visit note. 

## 2014-01-20 LAB — URINE CULTURE: Colony Count: >=100000

## 2014-02-04 ENCOUNTER — Other Ambulatory Visit: Payer: Self-pay | Admitting: Family Medicine

## 2014-02-05 NOTE — Telephone Encounter (Signed)
Electronic refill request, please advise  

## 2014-02-05 NOTE — Telephone Encounter (Signed)
done

## 2014-02-05 NOTE — Telephone Encounter (Signed)
Please refill both for 6 mo  

## 2014-02-27 ENCOUNTER — Telehealth: Payer: Self-pay | Admitting: Family Medicine

## 2014-02-27 DIAGNOSIS — R921 Mammographic calcification found on diagnostic imaging of breast: Secondary | ICD-10-CM

## 2014-02-27 NOTE — Telephone Encounter (Signed)
Pt's daughter, Victorino DikeJennifer called and says [facility], but b/c it's a diagnostic they told her you would have to place an order.  Can you place that? Pt's daughter requests a c/b [phone]/ Thank you.

## 2014-03-01 DIAGNOSIS — R921 Mammographic calcification found on diagnostic imaging of breast: Secondary | ICD-10-CM | POA: Insufficient documentation

## 2014-03-01 NOTE — Telephone Encounter (Signed)
Referral done

## 2014-03-06 LAB — HM DIABETES EYE EXAM

## 2014-03-19 ENCOUNTER — Encounter: Payer: Self-pay | Admitting: Family Medicine

## 2014-03-21 ENCOUNTER — Ambulatory Visit: Payer: Self-pay | Admitting: Family Medicine

## 2014-03-22 ENCOUNTER — Encounter: Payer: Self-pay | Admitting: Family Medicine

## 2014-05-02 ENCOUNTER — Telehealth: Payer: Self-pay | Admitting: Family Medicine

## 2014-05-02 DIAGNOSIS — I10 Essential (primary) hypertension: Secondary | ICD-10-CM

## 2014-05-02 DIAGNOSIS — E119 Type 2 diabetes mellitus without complications: Secondary | ICD-10-CM

## 2014-05-02 DIAGNOSIS — E785 Hyperlipidemia, unspecified: Secondary | ICD-10-CM

## 2014-05-02 NOTE — Telephone Encounter (Signed)
-----   Message from Alvina Chouerri J Walsh sent at 04/30/2014  3:54 PM EST ----- Regarding: Lab orders for Friday, 1.29.16 Labs for a 6 month f/u

## 2014-05-03 ENCOUNTER — Other Ambulatory Visit (INDEPENDENT_AMBULATORY_CARE_PROVIDER_SITE_OTHER): Payer: Medicare Other

## 2014-05-03 DIAGNOSIS — I10 Essential (primary) hypertension: Secondary | ICD-10-CM

## 2014-05-03 DIAGNOSIS — E785 Hyperlipidemia, unspecified: Secondary | ICD-10-CM

## 2014-05-03 DIAGNOSIS — E119 Type 2 diabetes mellitus without complications: Secondary | ICD-10-CM

## 2014-05-03 LAB — LIPID PANEL
CHOLESTEROL: 124 mg/dL (ref 0–200)
HDL: 56.4 mg/dL (ref >39.00)
LDL CALC: 38 mg/dL (ref 0–99)
NonHDL: 67.6
Total CHOL/HDL Ratio: 2
Triglycerides: 149 mg/dL (ref 0.0–149.0)
VLDL: 29.8 mg/dL (ref 0.0–40.0)

## 2014-05-03 LAB — COMPREHENSIVE METABOLIC PANEL
ALT: 15 U/L (ref 0–35)
AST: 17 U/L (ref 0–37)
Albumin: 3.6 g/dL (ref 3.5–5.2)
Alkaline Phosphatase: 61 U/L (ref 39–117)
BILIRUBIN TOTAL: 0.5 mg/dL (ref 0.2–1.2)
BUN: 13 mg/dL (ref 6–23)
CALCIUM: 9.9 mg/dL (ref 8.4–10.5)
CO2: 31 mEq/L (ref 19–32)
Chloride: 102 mEq/L (ref 96–112)
Creatinine, Ser: 1 mg/dL (ref 0.40–1.20)
GFR: 57.17 mL/min — AB (ref >60.00)
GLUCOSE: 122 mg/dL — AB (ref 70–99)
Potassium: 3.8 mEq/L (ref 3.5–5.1)
Sodium: 141 mEq/L (ref 135–145)
TOTAL PROTEIN: 6.5 g/dL (ref 6.0–8.3)

## 2014-05-03 LAB — HEMOGLOBIN A1C: Hgb A1c MFr Bld: 7.2 % — ABNORMAL HIGH (ref 4.6–6.5)

## 2014-05-10 ENCOUNTER — Encounter: Payer: Self-pay | Admitting: Family Medicine

## 2014-05-10 ENCOUNTER — Ambulatory Visit (INDEPENDENT_AMBULATORY_CARE_PROVIDER_SITE_OTHER)
Admission: RE | Admit: 2014-05-10 | Discharge: 2014-05-10 | Disposition: A | Discharge status: Home / Self Care | Payer: Medicare Other | Source: Ambulatory Visit | Attending: Family Medicine | Admitting: Family Medicine

## 2014-05-10 ENCOUNTER — Telehealth: Payer: Self-pay | Admitting: Family Medicine

## 2014-05-10 ENCOUNTER — Ambulatory Visit (INDEPENDENT_AMBULATORY_CARE_PROVIDER_SITE_OTHER): Payer: Medicare Other | Admitting: Family Medicine

## 2014-05-10 VITALS — BP 164/72 | HR 76 | Temp 98.9°F | Ht 63.0 in | Wt 160.8 lb

## 2014-05-10 DIAGNOSIS — E785 Hyperlipidemia, unspecified: Secondary | ICD-10-CM

## 2014-05-10 DIAGNOSIS — S62639A Displaced fracture of distal phalanx of unspecified finger, initial encounter for closed fracture: Secondary | ICD-10-CM | POA: Insufficient documentation

## 2014-05-10 DIAGNOSIS — Z23 Encounter for immunization: Secondary | ICD-10-CM

## 2014-05-10 DIAGNOSIS — S6990XA Unspecified injury of unspecified wrist, hand and finger(s), initial encounter: Secondary | ICD-10-CM | POA: Insufficient documentation

## 2014-05-10 DIAGNOSIS — I10 Essential (primary) hypertension: Secondary | ICD-10-CM

## 2014-05-10 DIAGNOSIS — S6991XA Unspecified injury of right wrist, hand and finger(s), initial encounter: Secondary | ICD-10-CM

## 2014-05-10 DIAGNOSIS — E119 Type 2 diabetes mellitus without complications: Secondary | ICD-10-CM

## 2014-05-10 DIAGNOSIS — R300 Dysuria: Secondary | ICD-10-CM

## 2014-05-10 LAB — POCT URINALYSIS DIPSTICK
Bilirubin, UA: NEGATIVE
Blood, UA: NEGATIVE
GLUCOSE UA: NEGATIVE
KETONES UA: NEGATIVE
NITRITE UA: NEGATIVE
UROBILINOGEN UA: 0.2
pH, UA: 6

## 2014-05-10 NOTE — Progress Notes (Signed)
Pre visit review using our clinic review tool, if applicable. No additional management support is needed unless otherwise documented below in the visit note. 

## 2014-05-10 NOTE — Telephone Encounter (Signed)
I will do the ref and route to Shirlee Limer[place]Mar[place] to do on Monday

## 2014-05-10 NOTE — Telephone Encounter (Signed)
-----   Message from Cedar Forthanthearin Sambath, [place] sent at 05/10/2014  4:48 PM EST ----- Notified patient and daughter Parks Ranger(Jennifer Sutton). [facility]

## 2014-05-10 NOTE — Patient Instructions (Signed)
Flu shot today  Xray of finger and ua on the way out  We will contact you with results and plan  BP is borderline -we will continue to watch  Get back on track with diabetic diet - A1C is up  Follow up in 3 months with labs prior

## 2014-05-10 NOTE — Progress Notes (Signed)
Subjective:    Patient ID: Amanda Benson, female    DOB: 03/26/1936, 79 y.o.   MRN: [id number]  HPI Here for f/u of chronic problems  Poss uti also  Pain to urinate for about 3 days  Also frequency - all night also  No blood in urine  Drinks water - might not be enough   Wt is stable 28   bp is stable today - first check  No cp or palpitations or headaches or edema  No side effects to medicines  BP Readings from Last 3 Encounters:  05/10/14 164/72  01/17/14 162/72  11/06/13 143/80     Lipids Lab Results  Component Value Date   CHOL 124 05/03/2014   CHOL 131 05/25/2013   CHOL 130 05/08/2012   Lab Results  Component Value Date   HDL 56.40 05/03/2014   HDL 52.60 05/25/2013   HDL 55.00 05/08/2012   Lab Results  Component Value Date   LDLCALC 38 05/03/2014   LDLCALC 41 05/25/2013   LDLCALC 39 05/08/2012   Lab Results  Component Value Date   TRIG 149.0 05/03/2014   TRIG 188.0* 05/25/2013   TRIG 178.0* 05/08/2012   Lab Results  Component Value Date   CHOLHDL 2 05/03/2014   CHOLHDL 2 05/25/2013   CHOLHDL 2 05/08/2012   Lab Results  Component Value Date   LDLDIRECT 107.2 09/19/2009   well controlled with zocor-no change   DM Lab Results  Component Value Date   HGBA1C 7.2* 05/03/2014   Up from 6.8  Ate poorly over the holidays -she ate "everything"  Not much exercise  Thinks she can do better  glipizide and metformin  No missed doses  On ARB Had her eye exam   Needs flu shot today   Also slammed her R middle finger in the door of a car 3 wk ago The last joint is flexed it can be manually extended but she cannot do it herself Is sore-she favors it  This affects her grip   Patient Active Problem List   Diagnosis Date Noted  . Breast calcifications on mammogram 03/01/2014  . Encounter for Medicare annual wellness exam 05/30/2013  . Breast calcification seen on mammogram 12/14/2012  . Breast calcification, left 11/28/2012  . History of  falling 11/27/2012  . Poor balance 11/27/2012  . Rash and nonspecific skin eruption 10/16/2012  . Colon cancer screening 05/10/2012  . Syncope 08/22/2011  . Obesity 06/14/2011  . OSTEOPOROSIS 06/21/2007  . Diabetes type 2, controlled 09/20/2006  . Hyperlipidemia 09/20/2006  . Essential hypertension 09/20/2006  . OSTEOARTHRITIS 09/20/2006   Past Medical History  Diagnosis Date  . Depression   . Diabetes mellitus     type II  . Hypertension   . Osteoporosis   . Hyperlipidemia   . Obesity    Past Surgical History  Procedure Laterality Date  . Cholecystectomy    . Ankle surgery      left medial malleblus fracture   History  Substance Use Topics  . Smoking status: Never Smoker   . Smokeless tobacco: Not on file  . Alcohol Use: No   Family History  Problem Relation Age of Onset  . Diabetes Mother   . Hypertension Mother   . Asthma Father   . Hypertension Sister   . Cancer Sister     lung  . Asthma Brother   . COPD Brother   . Cancer Maternal Grandmother     breast   . Asthma  Brother    Allergies  Allergen Reactions  . Alendronate Sodium     REACTION: heartburn   Current Outpatient Prescriptions on File Prior to Visit  Medication Sig Dispense Refill  . aspirin 81 MG EC tablet Take 81 mg by mouth daily.      Marland Kitchen BENICAR HCT 40-25 MG per tablet take 1 tablet by mouth once daily 90 tablet 1  . Blood Glucose Monitoring Suppl (ONE TOUCH ULTRA SYSTEM KIT) W/DEVICE KIT To check sugar daily and as needed for DM2 250.0  1 each 0  . buPROPion (WELLBUTRIN XL) 300 MG 24 hr tablet take 1 tablet by mouth once daily 90 tablet 1  . FLUoxetine (PROZAC) 40 MG capsule take 1 capsule by mouth once daily 90 capsule 1  . glipiZIDE (GLUCOTROL XL) 10 MG 24 hr tablet take 1 tablet by mouth once daily 90 tablet 1  . metFORMIN (GLUCOPHAGE) 1000 MG tablet take 1 tablet by mouth twice a day with food 180 tablet 1  . mometasone (ELOCON) 0.1 % cream Apply topically daily. To affected areas as  needed 45 g 0  . simvastatin (ZOCOR) 20 MG tablet take 1 tablet by mouth once daily 90 tablet 3  . sulfamethoxazole-trimethoprim (BACTRIM,SEPTRA) 400-80 MG per tablet Take 1 tablet by mouth 2 (two) times daily. 14 tablet 0   No current facility-administered medications on file prior to visit.    Review of Systems Review of Systems  Constitutional: Negative for fever, appetite change, fatigue and unexpected weight change.  Eyes: Negative for pain and visual disturbance.  Respiratory: Negative for cough and shortness of breath.   Cardiovascular: Negative for cp or palpitations    Gastrointestinal: Negative for nausea, diarrhea and constipation.  Genitourinary: pos for urgency and frequency. neg for hematuria  Skin: Negative for pallor or rash   MSK pos for R middle finger pain and swelling  Neurological: Negative for weakness, light-headedness, numbness and headaches.  Hematological: Negative for adenopathy. Does not bruise/bleed easily.  Psychiatric/Behavioral: Negative for dysphoric mood. The patient is not nervous/anxious.         Objective:   Physical Exam  Constitutional: She appears well-developed and well-nourished. No distress.  HENT:  Head: Normocephalic and atraumatic.  Right Ear: External ear normal.  Left Ear: External ear normal.  Nose: Nose normal.  Mouth/Throat: Oropharynx is clear and moist.  Eyes: Conjunctivae and EOM are normal. Pupils are equal, round, and reactive to light. Right eye exhibits no discharge. Left eye exhibits no discharge. No scleral icterus.  Neck: Normal range of motion. Neck supple. No JVD present. Carotid bruit is not present. No thyromegaly present.  Cardiovascular: Normal rate, regular rhythm, normal heart sounds and intact distal pulses.  Exam reveals no gallop.   Pulmonary/Chest: Effort normal and breath sounds normal. No respiratory distress. She has no wheezes. She has no rales.  Abdominal: Soft. Bowel sounds are normal. She exhibits no  distension, no abdominal bruit and no mass. There is tenderness.  Mild suprapubic tenderness No cva tenderness   Musculoskeletal: She exhibits edema and tenderness.  Distal phalanx of R middle finger is swollen and contracted  Can manually extend but she cannot do it on her own   Nl sens and perf  Somewhat tender   Lymphadenopathy:    She has no cervical adenopathy.  Neurological: She is alert. She has normal reflexes. No cranial nerve deficit. She exhibits normal muscle tone. Coordination normal.  Skin: Skin is warm and dry. No rash noted. No erythema.  No pallor.  Psychiatric: She has a normal mood and affect.          Assessment & Plan:   Problem List Items Addressed This Visit      Cardiovascular and Mediastinum   Essential hypertension    bp is not at goal Pt thinks this is from poor eating/habits and also pain Wants to hold off on tx changes until next visit when we check again  BP Readings from Last 1 Encounters:  05/10/14 164/72   Disc lifestyle change and DASH diet in detail  F/u planned         Endocrine   Diabetes type 2, controlled - Primary    Lab Results  Component Value Date   HGBA1C 7.2* 05/03/2014   Up from prev due to holiday eating and lack of exercise  She plans to get back on track  F/u planned  Rev low glycemic diet         Other   Dysuria    ua and urine cx today Disc imp of fluid intake Will tx if pos udpate if symptoms worsen      Relevant Orders   POCT urinalysis dipstick (Completed)   Urine culture   Finger injury    R middle finger is distally swollen and contracted since her injury 3 wk ago  Poss fracture  Xray now  Disc use of ice and elevation      Relevant Orders   DG Finger Middle Right (Completed)   Hyperlipidemia    Disc goals for lipids and reasons to control them Rev labs with pt Rev low sat fat diet in detail  zocor and diet         Other Visit Diagnoses    Flu vaccine need        Relevant Orders     Flu Vaccine QUAD 36+ mos IM (Completed)

## 2014-05-12 NOTE — Assessment & Plan Note (Signed)
R middle finger is distally swollen and contracted since her injury 3 wk ago  Poss fracture  Xray now  Disc use of ice and elevation

## 2014-05-12 NOTE — Assessment & Plan Note (Addendum)
bp is not at goal Pt thinks this is from poor eating/habits and also pain Wants to hold off on tx changes until next visit when we check again  BP Readings from Last 1 Encounters:  05/10/14 164/72   Disc lifestyle change and DASH diet in detail  F/u planned

## 2014-05-12 NOTE — Assessment & Plan Note (Signed)
ua and urine cx today Disc imp of fluid intake Will tx if pos udpate if symptoms worsen

## 2014-05-12 NOTE — Assessment & Plan Note (Signed)
Lab Results  Component Value Date   HGBA1C 7.2* 05/03/2014   Up from prev due to holiday eating and lack of exercise  She plans to get back on track  F/u planned  Rev low glycemic diet

## 2014-05-12 NOTE — Assessment & Plan Note (Signed)
Disc goals for lipids and reasons to control them Rev labs with pt Rev low sat fat diet in detail  zocor and diet  

## 2014-05-13 LAB — URINE CULTURE

## 2014-05-14 ENCOUNTER — Telehealth: Payer: Self-pay | Admitting: Family Medicine

## 2014-05-14 MED ORDER — AMOXICILLIN-POT CLAVULANATE 875-125 MG PO TABS: 1.0000 | ORAL_TABLET | Freq: Two times a day (BID) | ORAL | Status: DC

## 2014-05-14 NOTE — Telephone Encounter (Signed)
-----   Message from [place] Center[place] Sambath, [place] sent at 05/13/2014  3:30 PM EST ----- Spoken and notified patient of Dr Royden Purlower's comments. She stated that she has been going more often. It burns when she go urinate and there is a odor.

## 2014-05-14 NOTE — Telephone Encounter (Signed)
Since she is symptomatic - I am going to go ahead and treat her  Let her know I sent augmentin to her [facility]  Take it for a week and update if no improvement

## 2014-05-14 NOTE — Telephone Encounter (Signed)
Spoken to patient and notified her of Dr Tower's comments. 

## 2014-06-06 ENCOUNTER — Other Ambulatory Visit: Payer: Self-pay | Admitting: Family Medicine

## 2014-06-20 ENCOUNTER — Other Ambulatory Visit: Payer: Self-pay | Admitting: Family Medicine

## 2014-07-30 ENCOUNTER — Other Ambulatory Visit: Payer: Self-pay | Admitting: Family Medicine

## 2014-08-08 ENCOUNTER — Other Ambulatory Visit (INDEPENDENT_AMBULATORY_CARE_PROVIDER_SITE_OTHER): Payer: Medicare Other

## 2014-08-08 DIAGNOSIS — E119 Type 2 diabetes mellitus without complications: Secondary | ICD-10-CM

## 2014-08-08 LAB — HEMOGLOBIN A1C: Hgb A1c MFr Bld: 6.9 % — ABNORMAL HIGH (ref 4.6–6.5)

## 2014-08-13 ENCOUNTER — Encounter: Payer: Self-pay | Admitting: Family Medicine

## 2014-08-13 ENCOUNTER — Ambulatory Visit (INDEPENDENT_AMBULATORY_CARE_PROVIDER_SITE_OTHER): Payer: Medicare Other | Admitting: Family Medicine

## 2014-08-13 VITALS — BP 158/76 | HR 75 | Temp 99.2°F | Ht 63.0 in | Wt 163.2 lb

## 2014-08-13 DIAGNOSIS — I10 Essential (primary) hypertension: Secondary | ICD-10-CM

## 2014-08-13 DIAGNOSIS — R2689 Other abnormalities of gait and mobility: Secondary | ICD-10-CM | POA: Diagnosis not present

## 2014-08-13 DIAGNOSIS — E119 Type 2 diabetes mellitus without complications: Secondary | ICD-10-CM | POA: Diagnosis not present

## 2014-08-13 MED ORDER — LOSARTAN POTASSIUM-HCTZ 100-25 MG PO TABS: 1.0000 | ORAL_TABLET | Freq: Every day | ORAL | Status: DC

## 2014-08-13 MED ORDER — AMLODIPINE BESYLATE 5 MG PO TABS: 5.0000 mg | ORAL_TABLET | Freq: Every day | ORAL | Status: DC

## 2014-08-13 NOTE — Assessment & Plan Note (Signed)
bp is not adequately controlled Will add amlodipine 5 mg daily-disc poss side eff BP Readings from Last 3 Encounters:  08/13/14 158/76  05/10/14 164/72  01/17/14 162/72   Also change benicar hct to losartan hct at end of next px - in light of cost  Rev DASH eating plan and handout given Enc exercise  F/u 3 mo with lab prior

## 2014-08-13 NOTE — Assessment & Plan Note (Signed)
Some mild improvement Lab Results  Component Value Date   HGBA1C 6.9* 08/08/2014   Pt is resistant to change - but again stressed imp of low glycemic diet and exercise as tolerated She does not want to add more medication  F/u 3 mo with lab prior

## 2014-08-13 NOTE — Patient Instructions (Signed)
When you run out of benicar hct fill the px for losartan hct- it is similar and should be cheaper - let me know if any problems  Add amlodipine 5 mg once daily to improve blood pressure in addition to your other medicines  Look at the [facility] diet handout -for healthier eating  Watch sugar for diabetic control- your A1C is a little improved Start exercise on the exercise bike- start slow and see how you do

## 2014-08-13 NOTE — Progress Notes (Signed)
Pre visit review using our clinic review tool, if applicable. No additional management support is needed unless otherwise documented below in the visit note. 

## 2014-08-13 NOTE — Progress Notes (Signed)
Subjective:    Patient ID: Amanda Benson, female    DOB: [birth year], 79 y.o.   MRN: [id number]  HPI Here for f/u of chronic health problems  Wt is up 3 lb with bmi of 28  Feeling good overall  Not doing a lot -not steady enough to walk by herself  Wants to use the exercise bike   bp is not at goal on first check today  No cp or palpitations or headaches or edema  No side effects to medicines  BP Readings from Last 3 Encounters:  08/13/14 158/76  05/10/14 164/72  01/17/14 162/72     Takes benicar hct  Needs a copy of DASH diet   Diabetes Home sugar results  DM diet - no longer holiday eating like she was  Thinks she is eating less sugar //esp candy   Exercise none now  Symptoms none  A1C last  Lab Results  Component Value Date   HGBA1C 6.9* 08/08/2014  this is down from 7.2   No problems with medications  Renal protection ARB Last eye exam 12/15   Patient Active Problem List   Diagnosis Date Noted  . Finger injury 05/10/2014  . Dysuria 05/10/2014  . Fracture of finger, distal phalanx, right, closed 05/10/2014  . Breast calcifications on mammogram 03/01/2014  . Encounter for Medicare annual wellness exam 05/30/2013  . Breast calcification seen on mammogram 12/14/2012  . Breast calcification, left 11/28/2012  . History of falling 11/27/2012  . Poor balance 11/27/2012  . Rash and nonspecific skin eruption 10/16/2012  . Colon cancer screening 05/10/2012  . Syncope 08/22/2011  . Obesity 06/14/2011  . OSTEOPOROSIS 06/21/2007  . Diabetes type 2, controlled 09/20/2006  . Hyperlipidemia 09/20/2006  . Essential hypertension 09/20/2006  . OSTEOARTHRITIS 09/20/2006   Past Medical History  Diagnosis Date  . Depression   . Diabetes mellitus     type II  . Hypertension   . Osteoporosis   . Hyperlipidemia   . Obesity    Past Surgical History  Procedure Laterality Date  . Cholecystectomy    . Ankle surgery      left medial malleblus fracture   History    Substance Use Topics  . Smoking status: Never Smoker   . Smokeless tobacco: Not on file  . Alcohol Use: No   Family History  Problem Relation Age of Onset  . Diabetes Mother   . Hypertension Mother   . Asthma Father   . Hypertension Sister   . Cancer Sister     lung  . Asthma Brother   . COPD Brother   . Cancer Maternal Grandmother     breast   . Asthma Brother    Allergies  Allergen Reactions  . Alendronate Sodium     REACTION: heartburn   Current Outpatient Prescriptions on File Prior to Visit  Medication Sig Dispense Refill  . amoxicillin-clavulanate (AUGMENTIN) 875-125 MG per tablet Take 1 tablet by mouth 2 (two) times daily. 14 tablet 0  . aspirin 81 MG EC tablet Take 81 mg by mouth daily.      Marland Kitchen BENICAR HCT 40-25 MG per tablet take 1 tablet by mouth once daily 90 tablet 3  . Blood Glucose Monitoring Suppl (ONE TOUCH ULTRA SYSTEM KIT) W/DEVICE KIT To check sugar daily and as needed for DM2 250.0  1 each 0  . buPROPion (WELLBUTRIN XL) 300 MG 24 hr tablet take 1 tablet by mouth once daily 90 tablet 1  . FLUoxetine (  PROZAC) 40 MG capsule take 1 capsule by mouth once daily 90 capsule 1  . glipiZIDE (GLUCOTROL XL) 10 MG 24 hr tablet take 1 tablet by mouth once daily 90 tablet 1  . metFORMIN (GLUCOPHAGE) 1000 MG tablet take 1 tablet by mouth twice a day with food 180 tablet 1  . mometasone (ELOCON) 0.1 % cream Apply topically daily. To affected areas as needed 45 g 0  . simvastatin (ZOCOR) 20 MG tablet take 1 tablet by mouth once daily 90 tablet 3   No current facility-administered medications on file prior to visit.     Review of Systems    Review of Systems  Constitutional: Negative for fever, appetite change, fatigue and unexpected weight change.  Eyes: Negative for pain and visual disturbance.  Respiratory: Negative for cough and shortness of breath.   Cardiovascular: Negative for cp or palpitations    Gastrointestinal: Negative for nausea, diarrhea and  constipation.  Genitourinary: Negative for urgency and frequency.  Skin: Negative for pallor or rash   Neurological: Negative for weakness, light-headedness, numbness and headaches. pos for poor balance  Hematological: Negative for adenopathy. Does not bruise/bleed easily.  Psychiatric/Behavioral: Negative for dysphoric mood. The patient is not nervous/anxious.      Objective:   Physical Exam  Constitutional: She appears well-developed and well-nourished. No distress.  overwt and well appearing   HENT:  Head: Normocephalic and atraumatic.  Mouth/Throat: Oropharynx is clear and moist.  Eyes: Conjunctivae and EOM are normal. Pupils are equal, round, and reactive to light.  Neck: Normal range of motion. Neck supple. No JVD present. Carotid bruit is not present. No thyromegaly present.  Cardiovascular: Normal rate, regular rhythm, normal heart sounds and intact distal pulses.  Exam reveals no gallop.   Pulmonary/Chest: Effort normal and breath sounds normal. No respiratory distress. She has no wheezes. She has no rales.  No crackles  Abdominal: Soft. Bowel sounds are normal. She exhibits no distension, no abdominal bruit and no mass. There is no tenderness.  Musculoskeletal: She exhibits no edema.  Lymphadenopathy:    She has no cervical adenopathy.  Neurological: She is alert. She has normal reflexes. No cranial nerve deficit. She exhibits normal muscle tone. Coordination normal.  Gait is unsteady without walker   Skin: Skin is warm and dry. No rash noted.  Psychiatric: She has a normal mood and affect.          Assessment & Plan:      Problem List Items Addressed This Visit    Diabetes type 2, controlled    Some mild improvement Lab Results  Component Value Date   HGBA1C 6.9* 08/08/2014   Pt is resistant to change - but again stressed imp of low glycemic diet and exercise as tolerated She does not want to add more medication  F/u 3 mo with lab prior       Relevant  Medications   losartan-hydrochlorothiazide (HYZAAR) 100-25 MG per tablet   Essential hypertension - Primary    bp is not adequately controlled Will add amlodipine 5 mg daily-disc poss side eff BP Readings from Last 3 Encounters:  08/13/14 158/76  05/10/14 164/72  01/17/14 162/72   Also change benicar hct to losartan hct at end of next px - in light of cost  Rev DASH eating plan and handout given Enc exercise  F/u 3 mo with lab prior       Relevant Medications   amLODipine (NORVASC) 5 MG tablet   losartan-hydrochlorothiazide (HYZAAR) 100-25 MG  per tablet   Poor balance    Pt is thinking about employing a home health aide  No recent falls Is resistant to use of walker-stressed imp of this  Asked her daughter to eval home to determine needs for mobility

## 2014-08-13 NOTE — Assessment & Plan Note (Signed)
Pt is thinking about employing a home health aide  No recent falls Is resistant to use of walker-stressed imp of this  Asked her daughter to eval home to determine needs for mobility

## 2014-10-05 LAB — HM DIABETES EYE EXAM

## 2014-10-29 ENCOUNTER — Ambulatory Visit (INDEPENDENT_AMBULATORY_CARE_PROVIDER_SITE_OTHER): Payer: Medicare Other | Admitting: Family Medicine

## 2014-10-29 ENCOUNTER — Encounter: Payer: Self-pay | Admitting: Family Medicine

## 2014-10-29 VITALS — BP 158/78 | HR 75 | Temp 98.9°F | Ht 63.0 in | Wt 165.0 lb

## 2014-10-29 DIAGNOSIS — E119 Type 2 diabetes mellitus without complications: Secondary | ICD-10-CM | POA: Diagnosis not present

## 2014-10-29 DIAGNOSIS — R350 Frequency of micturition: Secondary | ICD-10-CM

## 2014-10-29 DIAGNOSIS — N3000 Acute cystitis without hematuria: Secondary | ICD-10-CM

## 2014-10-29 DIAGNOSIS — N390 Urinary tract infection, site not specified: Secondary | ICD-10-CM | POA: Insufficient documentation

## 2014-10-29 LAB — POCT URINALYSIS DIPSTICK
Bilirubin, UA: NEGATIVE
Glucose, UA: 500
KETONES UA: NEGATIVE
Nitrite, UA: NEGATIVE
PH UA: 6
UROBILINOGEN UA: 0.2

## 2014-10-29 MED ORDER — CIPROFLOXACIN HCL 250 MG PO TABS: 250.0000 mg | ORAL_TABLET | Freq: Two times a day (BID) | ORAL | Status: DC

## 2014-10-29 NOTE — Patient Instructions (Signed)
Drink lots of water  Try to follow a diabetic diet - look back at materials you have  Take cipro as directed  Check blood sugar am fasting and then 2 hours after a meal and bring the log to your next appointment   We will update you with a urine culture result when it returns

## 2014-10-29 NOTE — Assessment & Plan Note (Signed)
Will cover with cipro  Enc fluids  cx pending  Update if no improvement in several days

## 2014-10-29 NOTE — Progress Notes (Signed)
Subjective:    Patient ID: Amanda Benson, female    DOB: 11-15-1935, 79 y.o.   MRN: [id number]  HPI Here with urinary symptoms   Urinates very frequently  Cloudy urine  Burns to urinate Bladder discomfort  Started last Thursday - almost a week ago  Getting worse  Drinking water   Glucose is high also -noted it recently - 275 when she checked it recently (more than 2 hours pp) Lab Results  Component Value Date   HGBA1C 6.9* 08/08/2014   Plans to start checking her blood sugar until upcoming appt  Not following a diabetic diet - but eating small portions  Eats a lot of starch/potatoes / tater tots  Daughter will start prepping a diabetic diet and checking her blood sugar   Patient Active Problem List   Diagnosis Date Noted  . Finger injury 05/10/2014  . Dysuria 05/10/2014  . Fracture of finger, distal phalanx, right, closed 05/10/2014  . Breast calcifications on mammogram 03/01/2014  . Encounter for Medicare annual wellness exam 05/30/2013  . Breast calcification seen on mammogram 12/14/2012  . Breast calcification, left 11/28/2012  . History of falling 11/27/2012  . Poor balance 11/27/2012  . Rash and nonspecific skin eruption 10/16/2012  . Colon cancer screening 05/10/2012  . Syncope 08/22/2011  . Obesity 06/14/2011  . OSTEOPOROSIS 06/21/2007  . Diabetes type 2, controlled 09/20/2006  . Hyperlipidemia 09/20/2006  . Essential hypertension 09/20/2006  . OSTEOARTHRITIS 09/20/2006   Past Medical History  Diagnosis Date  . Depression   . Diabetes mellitus     type II  . Hypertension   . Osteoporosis   . Hyperlipidemia   . Obesity    Past Surgical History  Procedure Laterality Date  . Cholecystectomy    . Ankle surgery      left medial malleblus fracture   History  Substance Use Topics  . Smoking status: Never Smoker   . Smokeless tobacco: Not on file  . Alcohol Use: No   Family History  Problem Relation Age of Onset  . Diabetes Mother   .  Hypertension Mother   . Asthma Father   . Hypertension Sister   . Cancer Sister     lung  . Asthma Brother   . COPD Brother   . Cancer Maternal Grandmother     breast   . Asthma Brother    Allergies  Allergen Reactions  . Alendronate Sodium     REACTION: heartburn   Current Outpatient Prescriptions on File Prior to Visit  Medication Sig Dispense Refill  . amLODipine (NORVASC) 5 MG tablet Take 1 tablet (5 mg total) by mouth daily. 30 tablet 11  . aspirin 81 MG EC tablet Take 81 mg by mouth daily.      . Blood Glucose Monitoring Suppl (ONE TOUCH ULTRA SYSTEM KIT) W/DEVICE KIT To check sugar daily and as needed for DM2 250.0  1 each 0  . buPROPion (WELLBUTRIN XL) 300 MG 24 hr tablet take 1 tablet by mouth once daily 90 tablet 1  . FLUoxetine (PROZAC) 40 MG capsule take 1 capsule by mouth once daily 90 capsule 1  . glipiZIDE (GLUCOTROL XL) 10 MG 24 hr tablet take 1 tablet by mouth once daily 90 tablet 1  . losartan-hydrochlorothiazide (HYZAAR) 100-25 MG per tablet Take 1 tablet by mouth daily. 30 tablet 11  . metFORMIN (GLUCOPHAGE) 1000 MG tablet take 1 tablet by mouth twice a day with food 180 tablet 1  . mometasone (ELOCON)  0.1 % cream Apply topically daily. To affected areas as needed 45 g 0  . simvastatin (ZOCOR) 20 MG tablet take 1 tablet by mouth once daily 90 tablet 3   No current facility-administered medications on file prior to visit.      Review of Systems Review of Systems  Constitutional: Negative for fever, appetite change,  and unexpected weight change.  Eyes: Negative for pain and visual disturbance.  Respiratory: Negative for cough and shortness of breath.   Cardiovascular: Negative for cp or palpitations    Gastrointestinal: Negative for nausea, diarrhea and constipation.  Genitourinary: pos  for urgency and frequency. neg for hematuria  Skin: Negative for pallor or rash   Neurological: Negative for weakness, light-headedness, numbness and headaches.    Hematological: Negative for adenopathy. Does not bruise/bleed easily.  Psychiatric/Behavioral: Negative for dysphoric mood. The patient is not nervous/anxious.  pos for apathy about health in general        Objective:   Physical Exam  Constitutional: She appears well-developed and well-nourished. No distress.  obese and well appearing    HENT:  Head: Normocephalic and atraumatic.  Eyes: Conjunctivae and EOM are normal. Pupils are equal, round, and reactive to light.  Neck: Normal range of motion. Neck supple.  Cardiovascular: Normal rate, regular rhythm and normal heart sounds.   Pulmonary/Chest: Effort normal and breath sounds normal.  Abdominal: Soft. Bowel sounds are normal. She exhibits no distension. There is tenderness. There is no rebound.  No cva tenderness  Mild suprapubic tenderness  Musculoskeletal: She exhibits no edema.  Lymphadenopathy:    She has no cervical adenopathy.  Neurological: She is alert.  Skin: No rash noted.  Psychiatric: She exhibits a depressed mood.  Apathetic / negative attitude  Seemingly depressed           Assessment & Plan:   Problem List Items Addressed This Visit    Diabetes type 2, controlled    Noncompliant with diet and ambivalent about dx Recent glucose in 57s  Unsure if due to uti or ongoing Daughter to start obs diet /fixing meals and also checking glucose bid Will bring log to next visit which is soon - early aug      UTI (urinary tract infection)    Will cover with cipro  Enc fluids  cx pending  Update if no improvement in several days       Relevant Orders   Urine culture (Completed)    Other Visit Diagnoses    Urinary frequency    -  Primary    Relevant Orders    POCT urinalysis dipstick (Completed)

## 2014-10-29 NOTE — Assessment & Plan Note (Signed)
Noncompliant with diet and ambivalent about dx Recent glucose in 200s  Unsure if due to uti or ongoing Daughter to start obs diet /fixing meals and also checking glucose bid Will bring log to next visit which is soon - early aug

## 2014-10-29 NOTE — Progress Notes (Signed)
Pre visit review using our clinic review tool, if applicable. No additional management support is needed unless otherwise documented below in the visit note. 

## 2014-10-30 LAB — URINE CULTURE
Colony Count: NO GROWTH
ORGANISM ID, BACTERIA: NO GROWTH

## 2014-10-31 ENCOUNTER — Telehealth: Payer: Self-pay | Admitting: Family Medicine

## 2014-10-31 NOTE — Telephone Encounter (Signed)
Amanda Benson returned your call about ms Debruyne

## 2014-10-31 NOTE — Telephone Encounter (Signed)
Called # back and pt answered, I advise pt of lab results

## 2014-11-11 ENCOUNTER — Other Ambulatory Visit (INDEPENDENT_AMBULATORY_CARE_PROVIDER_SITE_OTHER): Payer: Medicare Other

## 2014-11-11 DIAGNOSIS — E119 Type 2 diabetes mellitus without complications: Secondary | ICD-10-CM | POA: Diagnosis not present

## 2014-11-11 DIAGNOSIS — I10 Essential (primary) hypertension: Secondary | ICD-10-CM | POA: Diagnosis not present

## 2014-11-11 LAB — COMPREHENSIVE METABOLIC PANEL
ALT: 17 U/L (ref 0–35)
AST: 18 U/L (ref 0–37)
Albumin: 3.7 g/dL (ref 3.5–5.2)
Alkaline Phosphatase: 63 U/L (ref 39–117)
BILIRUBIN TOTAL: 0.4 mg/dL (ref 0.2–1.2)
BUN: 21 mg/dL (ref 6–23)
CALCIUM: 9.6 mg/dL (ref 8.4–10.5)
CO2: 31 mEq/L (ref 19–32)
Chloride: 102 mEq/L (ref 96–112)
Creatinine, Ser: 1.05 mg/dL (ref 0.40–1.20)
GFR: 53.97 mL/min — AB (ref >60.00)
Glucose, Bld: 170 mg/dL — ABNORMAL HIGH (ref 70–99)
Potassium: 3.7 mEq/L (ref 3.5–5.1)
Sodium: 141 mEq/L (ref 135–145)
Total Protein: 6.6 g/dL (ref 6.0–8.3)

## 2014-11-11 LAB — LIPID PANEL
CHOLESTEROL: 121 mg/dL (ref 0–200)
HDL: 52.8 mg/dL (ref >39.00)
LDL CALC: 46 mg/dL (ref 0–99)
NonHDL: 67.8
Total CHOL/HDL Ratio: 2
Triglycerides: 111 mg/dL (ref 0.0–149.0)
VLDL: 22.2 mg/dL (ref 0.0–40.0)

## 2014-11-11 LAB — HEMOGLOBIN A1C: Hgb A1c MFr Bld: 7.1 % — ABNORMAL HIGH (ref 4.6–6.5)

## 2014-11-13 ENCOUNTER — Encounter: Payer: Self-pay | Admitting: Family Medicine

## 2014-11-13 ENCOUNTER — Ambulatory Visit (INDEPENDENT_AMBULATORY_CARE_PROVIDER_SITE_OTHER): Payer: Medicare Other | Admitting: Family Medicine

## 2014-11-13 VITALS — BP 145/70 | HR 82 | Temp 98.8°F | Ht 63.0 in | Wt 166.0 lb

## 2014-11-13 DIAGNOSIS — E119 Type 2 diabetes mellitus without complications: Secondary | ICD-10-CM

## 2014-11-13 DIAGNOSIS — E785 Hyperlipidemia, unspecified: Secondary | ICD-10-CM

## 2014-11-13 DIAGNOSIS — I10 Essential (primary) hypertension: Secondary | ICD-10-CM

## 2014-11-13 MED ORDER — AMLODIPINE BESYLATE 10 MG PO TABS: 10.0000 mg | ORAL_TABLET | Freq: Every day | ORAL | Status: DC

## 2014-11-13 NOTE — Patient Instructions (Addendum)
If you think you can exercise daily and follow a diabetic diet we can give it a try  However at your next appointment - bring a list of covered diabetes medications -so if it is not improved we can add or change medication For blood pressure-avoid salt and processed foods and increase amlodipine from 5 to 10 mg once daily  Follow up in 3 months with labs prior  Get a flu shot in the fall

## 2014-11-13 NOTE — Assessment & Plan Note (Signed)
This is improved but not yet at goal with addn of amlodipine BP: (!) 145/70 mmHg     Will inc amlodipine to 10 mg daily as tol F/u 3 mo  Update if side eff Rev DASH diet

## 2014-11-13 NOTE — Assessment & Plan Note (Signed)
Very well controlled with simvastatin Disc goals for lipids and reasons to control them Rev labs with pt Rev low sat fat diet in detail

## 2014-11-13 NOTE — Progress Notes (Signed)
Subjective:    Patient ID: Amanda Benson, female    DOB: [birth year], 79 y.o.   MRN: [id number]  HPI Here for f/u of chronic health problems   Is doing better   bp is improved  today - last visit added amlodipine Is on losartan hct  No cp or palpitations or headaches or edema  No side effects to medicines  BP Readings from Last 3 Encounters:  11/13/14 148/78  10/29/14 158/78  08/13/14 158/76    Trying to avoid salty foods  Does salt her eggs  Was exercising "until she got sick with uti"- is better now and ready to get back  Has a bike inside the house -can use on hot days   Diabetes Home sugar results - running around 170s , fasting 140s-170 DM diet - "about half way" Exercise none  Symptoms none  A1C last  Lab Results  Component Value Date   HGBA1C 7.1* 11/11/2014  was 6.9 Glucose was 170 No problems with medications glipizide xl 10 and metformin 1000 bid  Renal protection on arb Last eye exam 12/15  In terms of medication - would be ok with an injection  Not realistic to change lifestyle any further      Chemistry      Component Value Date/Time   NA 141 11/11/2014 0804   K 3.7 11/11/2014 0804   CL 102 11/11/2014 0804   CO2 31 11/11/2014 0804   BUN 21 11/11/2014 0804   CREATININE 1.05 11/11/2014 0804      Component Value Date/Time   CALCIUM 9.6 11/11/2014 0804   ALKPHOS 63 11/11/2014 0804   AST 18 11/11/2014 0804   ALT 17 11/11/2014 0804   BILITOT 0.4 11/11/2014 0804      Lab Results  Component Value Date   CHOL 121 11/11/2014   CHOL 124 05/03/2014   CHOL 131 05/25/2013   Lab Results  Component Value Date   HDL 52.80 11/11/2014   HDL 56.40 05/03/2014   HDL 52.60 05/25/2013   Lab Results  Component Value Date   LDLCALC 46 11/11/2014   LDLCALC 38 05/03/2014   LDLCALC 41 05/25/2013   Lab Results  Component Value Date   TRIG 111.0 11/11/2014   TRIG 149.0 05/03/2014   TRIG 188.0* 05/25/2013   Lab Results  Component Value Date   CHOLHDL 2 11/11/2014   CHOLHDL 2 05/03/2014   CHOLHDL 2 05/25/2013   Lab Results  Component Value Date   LDLDIRECT 107.2 09/19/2009   excellent control with simvastatin  Diet is fair for low fat eating      Patient Active Problem List   Diagnosis Date Noted  . UTI (urinary tract infection) 10/29/2014  . Finger injury 05/10/2014  . Dysuria 05/10/2014  . Fracture of finger, distal phalanx, right, closed 05/10/2014  . Breast calcifications on mammogram 03/01/2014  . Encounter for Medicare annual wellness exam 05/30/2013  . Breast calcification seen on mammogram 12/14/2012  . Breast calcification, left 11/28/2012  . History of falling 11/27/2012  . Poor balance 11/27/2012  . Rash and nonspecific skin eruption 10/16/2012  . Colon cancer screening 05/10/2012  . Syncope 08/22/2011  . Obesity 06/14/2011  . OSTEOPOROSIS 06/21/2007  . Diabetes type 2, controlled 09/20/2006  . Hyperlipidemia 09/20/2006  . Essential hypertension 09/20/2006  . OSTEOARTHRITIS 09/20/2006   Past Medical History  Diagnosis Date  . Depression   . Diabetes mellitus     type II  . Hypertension   . Osteoporosis   .  Hyperlipidemia   . Obesity    Past Surgical History  Procedure Laterality Date  . Cholecystectomy    . Ankle surgery      left medial malleblus fracture   Social History  Substance Use Topics  . Smoking status: Never Smoker   . Smokeless tobacco: None  . Alcohol Use: No   Family History  Problem Relation Age of Onset  . Diabetes Mother   . Hypertension Mother   . Asthma Father   . Hypertension Sister   . Cancer Sister     lung  . Asthma Brother   . COPD Brother   . Cancer Maternal Grandmother     breast   . Asthma Brother    Allergies  Allergen Reactions  . Alendronate Sodium     REACTION: heartburn   Current Outpatient Prescriptions on File Prior to Visit  Medication Sig Dispense Refill  . aspirin 81 MG EC tablet Take 81 mg by mouth daily.      . Blood Glucose  Monitoring Suppl (ONE TOUCH ULTRA SYSTEM KIT) W/DEVICE KIT To check sugar daily and as needed for DM2 250.0  1 each 0  . buPROPion (WELLBUTRIN XL) 300 MG 24 hr tablet take 1 tablet by mouth once daily 90 tablet 1  . ciprofloxacin (CIPRO) 250 MG tablet Take 1 tablet (250 mg total) by mouth 2 (two) times daily. 10 tablet 0  . FLUoxetine (PROZAC) 40 MG capsule take 1 capsule by mouth once daily 90 capsule 1  . glipiZIDE (GLUCOTROL XL) 10 MG 24 hr tablet take 1 tablet by mouth once daily 90 tablet 1  . losartan-hydrochlorothiazide (HYZAAR) 100-25 MG per tablet Take 1 tablet by mouth daily. 30 tablet 11  . metFORMIN (GLUCOPHAGE) 1000 MG tablet take 1 tablet by mouth twice a day with food 180 tablet 1  . mometasone (ELOCON) 0.1 % cream Apply topically daily. To affected areas as needed 45 g 0  . simvastatin (ZOCOR) 20 MG tablet take 1 tablet by mouth once daily 90 tablet 3   No current facility-administered medications on file prior to visit.    Review of Systems Review of Systems  Constitutional: Negative for fever, appetite change, fatigue and unexpected weight change.  Eyes: Negative for pain and visual disturbance.  Respiratory: Negative for cough and shortness of breath.   Cardiovascular: Negative for cp or palpitations    Gastrointestinal: Negative for nausea, diarrhea and constipation.  Genitourinary: Negative for urgency and frequency.  Skin: Negative for pallor or rash   Neurological: Negative for weakness, light-headedness, numbness and headaches.  Hematological: Negative for adenopathy. Does not bruise/bleed easily.  Psychiatric/Behavioral: Negative for dysphoric mood. The patient is not nervous/anxious.         Objective:   Physical Exam  Constitutional: She appears well-developed and well-nourished. No distress.  Well appearing elderly female   HENT:  Head: Normocephalic and atraumatic.  Mouth/Throat: Oropharynx is clear and moist.  Eyes: Conjunctivae and EOM are normal.  Pupils are equal, round, and reactive to light.  Neck: Normal range of motion. Neck supple. No JVD present. Carotid bruit is not present. No thyromegaly present.  Cardiovascular: Normal rate, regular rhythm, normal heart sounds and intact distal pulses.  Exam reveals no gallop.   Pulmonary/Chest: Effort normal and breath sounds normal. No respiratory distress. She has no wheezes. She has no rales.  No crackles  Abdominal: Soft. Bowel sounds are normal. She exhibits no distension, no abdominal bruit and no mass. There is no tenderness.  Musculoskeletal: She exhibits no edema.  Lymphadenopathy:    She has no cervical adenopathy.  Neurological: She is alert. She has normal reflexes.  Skin: Skin is warm and dry. No rash noted.  Psychiatric: She has a normal mood and affect.          Assessment & Plan:   Problem List Items Addressed This Visit    Diabetes type 2, controlled    Lab Results  Component Value Date   HGBA1C 7.1* 11/11/2014   This is worse due to bad diet habits and no exercise Pt promises to change this and seems more motivated Daughter cooks for her/ has done DM teaching  Will use her indoor exercise bike until weather cools down  Asked pt to bring and insurance list of covered DM meds to next appt -may need to consider basal insulin or other       Essential hypertension - Primary    This is improved but not yet at goal with addn of amlodipine BP: (!) 145/70 mmHg     Will inc amlodipine to 10 mg daily as tol F/u 3 mo  Update if side eff Rev DASH diet       Relevant Medications   amLODipine (NORVASC) 10 MG tablet   Hyperlipidemia    Very well controlled with simvastatin Disc goals for lipids and reasons to control them Rev labs with pt Rev low sat fat diet in detail       Relevant Medications   amLODipine (NORVASC) 10 MG tablet

## 2014-11-13 NOTE — Progress Notes (Signed)
Pre visit review using our clinic review tool, if applicable. No additional management support is needed unless otherwise documented below in the visit note. 

## 2014-11-13 NOTE — Assessment & Plan Note (Signed)
Lab Results  Component Value Date   HGBA1C 7.1* 11/11/2014   This is worse due to bad diet habits and no exercise Pt promises to change this and seems more motivated Daughter cooks for her/ has done DM teaching  Will use her indoor exercise bike until weather cools down  Asked pt to bring and insurance list of covered DM meds to next appt -may need to consider basal insulin or other

## 2014-11-20 ENCOUNTER — Telehealth: Payer: Self-pay

## 2014-11-20 NOTE — Telephone Encounter (Signed)
V/M left pt was to cb with names of diabetic supplies needed are one touch delica lancets 33 G and the test strips are one touch test strips.

## 2014-11-20 NOTE — Telephone Encounter (Signed)
Order written on px pad and in IN box

## 2014-11-21 NOTE — Telephone Encounter (Signed)
Left voicemail requesting pt to let me know what pharmacy to send Rx to

## 2014-11-22 NOTE — Telephone Encounter (Signed)
Left voicemail letting pt know Rx was sent to [facility] [address]. Since that is where her other Rxs are filled

## 2014-11-25 ENCOUNTER — Other Ambulatory Visit: Payer: Self-pay | Admitting: Family Medicine

## 2014-12-16 ENCOUNTER — Other Ambulatory Visit: Payer: Self-pay | Admitting: Family Medicine

## 2014-12-16 NOTE — Telephone Encounter (Signed)
Please refill to get by until her appt

## 2014-12-16 NOTE — Telephone Encounter (Signed)
Electronic refill request, pt has f/u on 02/12/15, last refilled on 06/06/14 #90 with 1 additional refill, please advise

## 2014-12-17 NOTE — Telephone Encounter (Signed)
done

## 2015-01-20 ENCOUNTER — Other Ambulatory Visit: Payer: Self-pay | Admitting: Family Medicine

## 2015-02-05 ENCOUNTER — Other Ambulatory Visit (INDEPENDENT_AMBULATORY_CARE_PROVIDER_SITE_OTHER): Payer: Medicare Other

## 2015-02-05 DIAGNOSIS — E119 Type 2 diabetes mellitus without complications: Secondary | ICD-10-CM | POA: Diagnosis not present

## 2015-02-05 LAB — HEMOGLOBIN A1C: HEMOGLOBIN A1C: 7.2 % — AB (ref 4.6–6.5)

## 2015-02-12 ENCOUNTER — Ambulatory Visit (INDEPENDENT_AMBULATORY_CARE_PROVIDER_SITE_OTHER): Payer: Medicare Other | Admitting: Family Medicine

## 2015-02-12 ENCOUNTER — Encounter: Payer: Self-pay | Admitting: Family Medicine

## 2015-02-12 VITALS — BP 146/78 | HR 78 | Temp 99.1°F | Ht 63.0 in | Wt 160.5 lb

## 2015-02-12 DIAGNOSIS — E669 Obesity, unspecified: Secondary | ICD-10-CM | POA: Diagnosis not present

## 2015-02-12 DIAGNOSIS — Z23 Encounter for immunization: Secondary | ICD-10-CM

## 2015-02-12 DIAGNOSIS — I10 Essential (primary) hypertension: Secondary | ICD-10-CM | POA: Diagnosis not present

## 2015-02-12 DIAGNOSIS — E119 Type 2 diabetes mellitus without complications: Secondary | ICD-10-CM

## 2015-02-12 NOTE — Progress Notes (Signed)
Subjective:    Patient ID: Amanda Benson, female    DOB: 1935/04/18, 79 y.o.   MRN: [id number]  HPI Here for f/u of chronic medical problems   Wt is down 3 lb  Has cut back a bit on eating  Not more active   bp is stable today -first check  Did inc amlodipine to 10 mg  No cp or palpitations or headaches or edema  No side effects to medicines  BP Readings from Last 3 Encounters:  02/12/15 146/78  11/13/14 145/70  10/29/14 158/78     Diabetes Home sugar results - up and down ams 150s and above and PM ranges widely 100-200 (lowest 85)- she knows what symptoms to look for/none below 70  DM diet -when glucose is high - usually from what she ate  Now her daughter will cook and give her portions  Exercise - works around the house- grocery shopping -mainly , not motivated to walk  Motivation is sometimes an issue  Symptoms A1C last  Lab Results  Component Value Date   HGBA1C 7.2* 02/05/2015  up from 7.1  No problems with medications  Renal protection -ARB Last eye exam 12/15   Needs flu and prevnar vaccine   Her ins covers lantus at [employer] -(also levemir)   Patient Active Problem List   Diagnosis Date Noted  . UTI (urinary tract infection) 10/29/2014  . Finger injury 05/10/2014  . Dysuria 05/10/2014  . Fracture of finger, distal phalanx, right, closed 05/10/2014  . Breast calcifications on mammogram 03/01/2014  . Encounter for Medicare annual wellness exam 05/30/2013  . Breast calcification seen on mammogram 12/14/2012  . Breast calcification, left 11/28/2012  . History of falling 11/27/2012  . Poor balance 11/27/2012  . Rash and nonspecific skin eruption 10/16/2012  . Colon cancer screening 05/10/2012  . Syncope 08/22/2011  . Obesity 06/14/2011  . OSTEOPOROSIS 06/21/2007  . Diabetes type 2, controlled ([place]) 09/20/2006  . Hyperlipidemia 09/20/2006  . Essential hypertension 09/20/2006  . OSTEOARTHRITIS 09/20/2006   Past Medical History  Diagnosis Date  .  Depression   . Diabetes mellitus     type II  . Hypertension   . Osteoporosis   . Hyperlipidemia   . Obesity    Past Surgical History  Procedure Laterality Date  . Cholecystectomy    . Ankle surgery      left medial malleblus fracture   Social History  Substance Use Topics  . Smoking status: Never Smoker   . Smokeless tobacco: None  . Alcohol Use: No   Family History  Problem Relation Age of Onset  . Diabetes Mother   . Hypertension Mother   . Asthma Father   . Hypertension Sister   . Cancer Sister     lung  . Asthma Brother   . COPD Brother   . Cancer Maternal Grandmother     breast   . Asthma Brother    Allergies  Allergen Reactions  . Alendronate Sodium     REACTION: heartburn   Current Outpatient Prescriptions on File Prior to Visit  Medication Sig Dispense Refill  . amLODipine (NORVASC) 10 MG tablet Take 1 tablet (10 mg total) by mouth daily. 30 tablet 11  . aspirin 81 MG EC tablet Take 81 mg by mouth daily.      . Blood Glucose Monitoring Suppl (ONE TOUCH ULTRA SYSTEM KIT) W/DEVICE KIT To check sugar daily and as needed for DM2 250.0  1 each 0  .  buPROPion (WELLBUTRIN XL) 300 MG 24 hr tablet take 1 tablet by mouth once daily 90 tablet 0  . ciprofloxacin (CIPRO) 250 MG tablet Take 1 tablet (250 mg total) by mouth 2 (two) times daily. 10 tablet 0  . FLUoxetine (PROZAC) 40 MG capsule take 1 capsule by mouth once daily 90 capsule 0  . glipiZIDE (GLUCOTROL XL) 10 MG 24 hr tablet take 1 tablet by mouth once daily 90 tablet 0  . losartan-hydrochlorothiazide (HYZAAR) 100-25 MG per tablet Take 1 tablet by mouth daily. 30 tablet 11  . metFORMIN (GLUCOPHAGE) 1000 MG tablet take 1 tablet by mouth twice a day with food 180 tablet 0  . mometasone (ELOCON) 0.1 % cream Apply topically daily. To affected areas as needed 45 g 0  . simvastatin (ZOCOR) 20 MG tablet take 1 tablet by mouth once daily 90 tablet 3   No current facility-administered medications on file prior to  visit.     Review of Systems Review of Systems  Constitutional: Negative for fever, appetite change, fatigue and unexpected weight change.  Eyes: Negative for pain and visual disturbance.  Respiratory: Negative for cough and shortness of breath.   Cardiovascular: Negative for cp or palpitations    Gastrointestinal: Negative for nausea, diarrhea and constipation.  Genitourinary: Negative for urgency and frequency.  Skin: Negative for pallor or rash   Neurological: Negative for weakness, light-headedness, numbness and headaches.  Hematological: Negative for adenopathy. Does not bruise/bleed easily.  Psychiatric/Behavioral: Negative for dysphoric mood. The patient is not nervous/anxious.         Objective:   Physical Exam  Constitutional: She appears well-developed and well-nourished. No distress.  obese and well appearing   HENT:  Head: Normocephalic and atraumatic.  Mouth/Throat: Oropharynx is clear and moist.  Eyes: Conjunctivae and EOM are normal. Pupils are equal, round, and reactive to light.  Neck: Normal range of motion. Neck supple. No JVD present. Carotid bruit is not present. No thyromegaly present.  Cardiovascular: Normal rate, regular rhythm, normal heart sounds and intact distal pulses.  Exam reveals no gallop.   Pulmonary/Chest: Effort normal and breath sounds normal. No respiratory distress. She has no wheezes. She has no rales.  No crackles  Abdominal: Soft. Bowel sounds are normal. She exhibits no distension, no abdominal bruit and no mass. There is no tenderness.  Musculoskeletal: She exhibits no edema.  Lymphadenopathy:    She has no cervical adenopathy.  Neurological: She is alert. She has normal reflexes.  Skin: Skin is warm and dry. No rash noted.  Psychiatric: She has a normal mood and affect.  Mood is improved/pt seems more motivated about self care          Assessment & Plan:   Problem List Items Addressed This Visit      Cardiovascular and  Mediastinum   Essential hypertension - Primary    bp in fair control at this time -hope for improvement with DASH eating plan and some exercise  Disc goals for DM 130/80 BP Readings from Last 1 Encounters:  02/12/15 146/78   No changes needed Disc lifstyle change with low sodium diet and exercise F/u 3 mo         Endocrine   Diabetes type 2, controlled ([place])    Lab Results  Component Value Date   HGBA1C 7.2* 02/05/2015   Not optimal control Pt thinks she can do much better with diet - her daughter plans to do all the cooking and meal prep now and refuses  to buy junk food/ will control portions (both of them have done diabetic education) Rev ins list and if no imp with this - will consider lantus insulin which is covered  Wt loss enc and made a plan for chair exercise         Other   Obesity    Discussed how this problem influences overall health and the risks it imposes  Reviewed plan for weight loss with lower calorie diet (via better food choices and also portion control or program like weight watchers) and exercise building up to or more than 30 minutes 5 days per week including some aerobic activity   Pt is capable of chair exercise -will begin this with goal of 5 d per week  Daughter will take over meal prep with smaller portions        Other Visit Diagnoses    Need for influenza vaccination        Relevant Orders    Flu Vaccine QUAD 36+ mos PF IM (Fluarix & Fluzone Quad PF) (Completed)    Need for vaccination with 13-polyvalent pneumococcal conjugate vaccine        Relevant Orders    Pneumococcal conjugate vaccine 13-valent (Completed)

## 2015-02-12 NOTE — Patient Instructions (Signed)
google chair exercise and find a program you like Work up to 30 minutes 5 days per week of chair exercise Flu and prevnar vaccines today   Stick closely to a diabetic diet   Also watch sodium in diet   Follow up in 3 months with labs prior  If no improvement -we will add lantus insulin  If no improvement in blood pressure -we may have to change therapy

## 2015-02-12 NOTE — Progress Notes (Signed)
Pre visit review using our clinic review tool, if applicable. No additional management support is needed unless otherwise documented below in the visit note. 

## 2015-02-13 NOTE — Assessment & Plan Note (Signed)
Lab Results  Component Value Date   HGBA1C 7.2* 02/05/2015   Not optimal control Pt thinks she can do much better with diet - her daughter plans to do all the cooking and meal prep now and refuses to buy junk food/ will control portions (both of them have done diabetic education) Rev ins list and if no imp with this - will consider lantus insulin which is covered  Wt loss enc and made a plan for chair exercise

## 2015-02-13 NOTE — Assessment & Plan Note (Signed)
Discussed how this problem influences overall health and the risks it imposes  Reviewed plan for weight loss with lower calorie diet (via better food choices and also portion control or program like weight watchers) and exercise building up to or more than 30 minutes 5 days per week including some aerobic activity   Pt is capable of chair exercise -will begin this with goal of 5 d per week  Daughter will take over meal prep with smaller portions

## 2015-02-13 NOTE — Assessment & Plan Note (Signed)
bp in fair control at this time -hope for improvement with DASH eating plan and some exercise  Disc goals for DM 130/80 BP Readings from Last 1 Encounters:  02/12/15 146/78   No changes needed Disc lifstyle change with low sodium diet and exercise F/u 3 mo

## 2015-02-25 ENCOUNTER — Other Ambulatory Visit: Payer: Self-pay | Admitting: Family Medicine

## 2015-03-13 ENCOUNTER — Other Ambulatory Visit: Payer: Self-pay | Admitting: Family Medicine

## 2015-03-25 ENCOUNTER — Telehealth: Payer: Self-pay

## 2015-03-25 NOTE — Telephone Encounter (Signed)
Pt has an appt on 05/2015 for a follow up. Can it be changed to a 30 min AWV?

## 2015-04-28 ENCOUNTER — Other Ambulatory Visit: Payer: Self-pay | Admitting: *Deleted

## 2015-04-28 MED ORDER — GLIPIZIDE ER 10 MG PO TB24: 10.0000 mg | ORAL_TABLET | Freq: Every day | ORAL | Status: DC

## 2015-04-28 MED ORDER — FLUOXETINE HCL 40 MG PO CAPS
40.0000 mg | ORAL_CAPSULE | Freq: Every day | ORAL | Status: DC
Start: 2015-04-28 — End: 2015-07-30

## 2015-05-09 ENCOUNTER — Other Ambulatory Visit (INDEPENDENT_AMBULATORY_CARE_PROVIDER_SITE_OTHER): Payer: Medicare Other

## 2015-05-09 DIAGNOSIS — I10 Essential (primary) hypertension: Secondary | ICD-10-CM | POA: Diagnosis not present

## 2015-05-09 DIAGNOSIS — E119 Type 2 diabetes mellitus without complications: Secondary | ICD-10-CM

## 2015-05-09 LAB — COMPREHENSIVE METABOLIC PANEL
ALBUMIN: 3.7 g/dL (ref 3.5–5.2)
ALT: 20 U/L (ref 0–35)
AST: 24 U/L (ref 0–37)
Alkaline Phosphatase: 58 U/L (ref 39–117)
BUN: 24 mg/dL — AB (ref 6–23)
CALCIUM: 10.2 mg/dL (ref 8.4–10.5)
CHLORIDE: 100 meq/L (ref 96–112)
CO2: 34 mEq/L — ABNORMAL HIGH (ref 19–32)
Creatinine, Ser: 0.92 mg/dL (ref 0.40–1.20)
GFR: 62.78 mL/min (ref >60.00)
Glucose, Bld: 210 mg/dL — ABNORMAL HIGH (ref 70–99)
POTASSIUM: 3.9 meq/L (ref 3.5–5.1)
SODIUM: 141 meq/L (ref 135–145)
Total Bilirubin: 0.4 mg/dL (ref 0.2–1.2)
Total Protein: 6.5 g/dL (ref 6.0–8.3)

## 2015-05-09 LAB — LIPID PANEL
CHOLESTEROL: 122 mg/dL (ref 0–200)
HDL: 52.3 mg/dL (ref >39.00)
LDL CALC: 34 mg/dL (ref 0–99)
NonHDL: 69.97
TRIGLYCERIDES: 182 mg/dL — AB (ref 0.0–149.0)
Total CHOL/HDL Ratio: 2
VLDL: 36.4 mg/dL (ref 0.0–40.0)

## 2015-05-09 LAB — HEMOGLOBIN A1C: Hgb A1c MFr Bld: 7 % — ABNORMAL HIGH (ref 4.6–6.5)

## 2015-05-16 ENCOUNTER — Encounter: Payer: Self-pay | Admitting: Family Medicine

## 2015-05-16 ENCOUNTER — Ambulatory Visit (INDEPENDENT_AMBULATORY_CARE_PROVIDER_SITE_OTHER): Payer: Medicare Other | Admitting: Family Medicine

## 2015-05-16 VITALS — BP 132/70 | HR 77 | Temp 98.3°F | Ht 62.75 in | Wt 155.0 lb

## 2015-05-16 DIAGNOSIS — M810 Age-related osteoporosis without current pathological fracture: Secondary | ICD-10-CM | POA: Diagnosis not present

## 2015-05-16 DIAGNOSIS — I10 Essential (primary) hypertension: Secondary | ICD-10-CM

## 2015-05-16 DIAGNOSIS — E785 Hyperlipidemia, unspecified: Secondary | ICD-10-CM

## 2015-05-16 DIAGNOSIS — Z0000 Encounter for general adult medical examination without abnormal findings: Secondary | ICD-10-CM

## 2015-05-16 DIAGNOSIS — E119 Type 2 diabetes mellitus without complications: Secondary | ICD-10-CM | POA: Diagnosis not present

## 2015-05-16 DIAGNOSIS — Z9181 History of falling: Secondary | ICD-10-CM

## 2015-05-16 DIAGNOSIS — Z515 Encounter for palliative care: Secondary | ICD-10-CM | POA: Insufficient documentation

## 2015-05-16 DIAGNOSIS — Z1211 Encounter for screening for malignant neoplasm of colon: Secondary | ICD-10-CM

## 2015-05-16 NOTE — Progress Notes (Signed)
Subjective:    Patient ID: Amanda Benson, female    DOB: 06-07-1935, 80 y.o.   MRN: [id number]  HPI Here for annual medicare wellness visit as well as chronic/acute medical problems as well as annual preventative examination  I have personally reviewed the Medicare Annual Wellness questionnaire and have noted 1. The patient's medical and social history 2. Their use of alcohol, tobacco or illicit drugs 3. Their current medications and supplements 4. The patient's functional ability including ADL's, fall risks, home safety risks and hearing or visual             impairment. 5. Diet and physical activities 6. Evidence for depression or mood disorders  The patients weight, height, BMI have been recorded in the chart and visual acuity is per eye clinic.  I have made referrals, counseling and provided education to the patient based review of the above and I have provided the pt with a written personalized care plan for preventive services. Reviewed and updated provider list, see scanned forms.  See scanned forms.  Routine anticipatory guidance given to patient.  See health maintenance. Colon cancer screening-never had colonoscopy, declined and now aged out -will do ifob kit Breast cancer screening mammogram - overdue and does not want to do any more mammograms  Self breast exam- no lumps on self exam  Flu vaccine 11/16  Tetanus vaccine 2/14  Pneumovax complete 11/16  Zoster vaccine - unsure if she wants  dexa 2/14 - osteoporosis forearm / osteopenia at the hips - declines further bone density  Tried fosamax- and it gave her esophageal symptoms  She does take calcium and D No falls in the past year (has been more careful)  Has had ankle fracture and finger fracture in the past  May be open to trying evista if covered  Advance directive has a living will and power of attorney  Cognitive function addressed- see scanned forms- and if abnormal then additional documentation follows. Pt  states memory is very good- daughter agrees  Her husband does the finances but she can do them if she needs to   [place] and [place] reviewed  Meds, vitals, and allergies reviewed.   ROS: See HPI.  Otherwise negative.    Wt is down 5 lb with bmi of 27 Smaller portion sizes  Walks around [employer] for exercise  No formal exercise program  Owns an exercise bike - will start to use it    DM2   Lab Results  Component Value Date   HGBA1C 7.0* 05/09/2015  on glipizide and metformin  Diet is about the same - but portions are lower  This is down from 7.2   bp is high on first check  No cp or palpitations or headaches or edema  No side effects to medicines  BP Readings from Last 3 Encounters:  05/16/15 144/96  02/12/15 146/78  11/13/14 145/70     Cholesterol  Lab Results  Component Value Date   CHOL 122 05/09/2015   CHOL 121 11/11/2014   CHOL 124 05/03/2014   Lab Results  Component Value Date   HDL 52.30 05/09/2015   HDL 52.80 11/11/2014   HDL 56.40 05/03/2014   Lab Results  Component Value Date   LDLCALC 34 05/09/2015   LDLCALC 46 11/11/2014   LDLCALC 38 05/03/2014   Lab Results  Component Value Date   TRIG 182.0* 05/09/2015   TRIG 111.0 11/11/2014   TRIG 149.0 05/03/2014   Lab Results  Component Value Date  CHOLHDL 2 05/09/2015   CHOLHDL 2 11/11/2014   CHOLHDL 2 05/03/2014   Lab Results  Component Value Date   LDLDIRECT 107.2 09/19/2009  trig may be elevated from blood sugar  She did eat a lemon bar the day of her draw  Takes simvastatin for cholesterol     Patient Active Problem List   Diagnosis Date Noted  . Routine general medical examination at a health care facility 05/16/2015  . UTI (urinary tract infection) 10/29/2014  . Finger injury 05/10/2014  . Dysuria 05/10/2014  . Fracture of finger, distal phalanx, right, closed 05/10/2014  . Breast calcifications on mammogram 03/01/2014  . Encounter for Medicare annual wellness exam 05/30/2013  . Breast  calcification seen on mammogram 12/14/2012  . Breast calcification, left 11/28/2012  . History of falling 11/27/2012  . Poor balance 11/27/2012  . Rash and nonspecific skin eruption 10/16/2012  . Colon cancer screening 05/10/2012  . Syncope 08/22/2011  . Osteoporosis 06/21/2007  . Diabetes type 2, controlled (HCC) 09/20/2006  . Hyperlipidemia 09/20/2006  . Essential hypertension 09/20/2006  . OSTEOARTHRITIS 09/20/2006   Past Medical History  Diagnosis Date  . Depression   . Diabetes mellitus     type II  . Hypertension   . Osteoporosis   . Hyperlipidemia   . Obesity    Past Surgical History  Procedure Laterality Date  . Cholecystectomy    . Ankle surgery      left medial malleblus fracture   Social History  Substance Use Topics  . Smoking status: Never Smoker   . Smokeless tobacco: None  . Alcohol Use: No   Family History  Problem Relation Age of Onset  . Diabetes Mother   . Hypertension Mother   . Asthma Father   . Hypertension Sister   . Cancer Sister     lung  . Asthma Brother   . COPD Brother   . Cancer Maternal Grandmother     breast   . Asthma Brother    Allergies  Allergen Reactions  . Alendronate Sodium     REACTION: heartburn   Current Outpatient Prescriptions on File Prior to Visit  Medication Sig Dispense Refill  . amLODipine (NORVASC) 10 MG tablet Take 1 tablet (10 mg total) by mouth daily. 30 tablet 11  . aspirin 81 MG EC tablet Take 81 mg by mouth daily.      . Blood Glucose Monitoring Suppl (ONE TOUCH ULTRA SYSTEM KIT) W/DEVICE KIT To check sugar daily and as needed for DM2 250.0  1 each 0  . buPROPion (WELLBUTRIN XL) 300 MG 24 hr tablet take 1 tablet by mouth once daily 90 tablet 0  . FLUoxetine (PROZAC) 40 MG capsule Take 1 capsule (40 mg total) by mouth daily. 90 capsule 0  . glipiZIDE (GLUCOTROL XL) 10 MG 24 hr tablet Take 1 tablet (10 mg total) by mouth daily. 90 tablet 0  . losartan-hydrochlorothiazide (HYZAAR) 100-25 MG per tablet  Take 1 tablet by mouth daily. 30 tablet 11  . metFORMIN (GLUCOPHAGE) 1000 MG tablet take 1 tablet by mouth twice a day with food 180 tablet 0  . mometasone (ELOCON) 0.1 % cream Apply topically daily. To affected areas as needed 45 g 0  . simvastatin (ZOCOR) 20 MG tablet take 1 tablet by mouth once daily 90 tablet 3   No current facility-administered medications on file prior to visit.    Review of Systems Review of Systems  Constitutional: Negative for fever, appetite change, fatigue and unexpected  weight change.  Eyes: Negative for pain and visual disturbance.  Respiratory: Negative for cough and shortness of breath.   Cardiovascular: Negative for cp or palpitations    Gastrointestinal: Negative for nausea, diarrhea and constipation.  Genitourinary: Negative for urgency and frequency.  Skin: Negative for pallor or rash   MSK pos for joint stiffness and arthritis pin  Neurological: Negative for weakness, light-headedness, numbness and headaches. pos for poor balance and falls  Hematological: Negative for adenopathy. Does not bruise/bleed easily.  Psychiatric/Behavioral: Negative for dysphoric mood. The patient is not nervous/anxious.         Objective:   Physical Exam  Constitutional: She appears well-developed and well-nourished. No distress.  overwt and well app  HENT:  Head: Normocephalic and atraumatic.  Right Ear: External ear normal.  Left Ear: External ear normal.  Nose: Nose normal.  Mouth/Throat: Oropharynx is clear and moist.  Eyes: Conjunctivae and EOM are normal. Pupils are equal, round, and reactive to light. Right eye exhibits no discharge. Left eye exhibits no discharge. No scleral icterus.  Neck: Normal range of motion. Neck supple. No JVD present. Carotid bruit is not present. No thyromegaly present.  Cardiovascular: Normal rate, regular rhythm, normal heart sounds and intact distal pulses.  Exam reveals no gallop.   Pulmonary/Chest: Effort normal and breath  sounds normal. No respiratory distress. She has no wheezes. She has no rales.  Abdominal: Soft. Bowel sounds are normal. She exhibits no distension and no mass. There is no tenderness.  Genitourinary:  Pt declines breast exam  Musculoskeletal: She exhibits no edema or tenderness.  Milk kyphosis   Lymphadenopathy:    She has no cervical adenopathy.  Neurological: She is alert. She has normal reflexes. No cranial nerve deficit. She exhibits normal muscle tone. Coordination normal.  Skin: Skin is warm and dry. No rash noted. No erythema. No pallor.  Psychiatric: She has a normal mood and affect.          Assessment & Plan:   Problem List Items Addressed This Visit      Cardiovascular and Mediastinum   Essential hypertension    bp in fair control at this time  BP Readings from Last 1 Encounters:  05/16/15 132/70   No changes needed Disc lifstyle change with low sodium diet and exercise  Labs reviewed         Endocrine   Diabetes type 2, controlled ([place])    Lab Results  Component Value Date   HGBA1C 7.0* 05/09/2015   This is improved but not quite at goal Enc her to consider DM teaching at [facility] need for low glycemic diet/exercise and wt loss         Musculoskeletal and Integument   Osteoporosis    dexa 2014 and declines further  She has had more than one fx and qualifies for prev med  Intol of fosamax  Given information on evista to consider- if interested will call for px  Disc safety  Disc need for calcium/ vitamin D/ wt bearing exercise and bone density test every 2 y to monitor Disc safety/ fracture risk in detail          Other   Colon cancer screening    Given ifob screen kit      Relevant Orders   Fecal occult blood, imunochemical   Encounter for Medicare annual wellness exam - Primary    Reviewed health habits including diet and exercise and skin cancer prevention Reviewed appropriate screening tests for  age  Also reviewed health mt list,  fam hx and immunization status , as well as social and family history   See HPI Labs reviewed  Please do the stool kit for colon cancer screening  If you are interested in a shingles/zoster vaccine - call your insurance to check on coverage,( you should not get it within 1 month of other vaccines) , then call us for a prescription  for it to take to a pharmacy that gives the shot , or make a nurse visit to get it here depending on your coverage Here is some information on Evista - a medication to prevent fractures (broken bones) from osteoporosis  Try to schedule some exercise every day- try your exercise bike again  If you are interested- [facility] has a great diabetic education program- you can stop by the pharmacy and ask about it and they can give you information and talk about insurance coverage  Diabetes is slightly improved - keep working on diabetic diet and exercise  Follow up in 3 months with labs prior for diabetes       History of falling    With poor balance and OP  Given handout on fall prev Enc walker/cane/assistance with ambulation       Hyperlipidemia    Disc goals for lipids and reasons to control them Rev labs with pt Rev low sat fat diet in detail Trig are still over goal likely due to DM Disc low glycemic diet as well       Routine general medical examination at a health care facility    Reviewed health habits including diet and exercise and skin cancer prevention Reviewed appropriate screening tests for age  Also reviewed health mt list, fam hx and immunization status , as well as social and family history   See HPI Labs reviewed  Please do the stool kit for colon cancer screening  If you are interested in a shingles/zoster vaccine - call your insurance to check on coverage,( you should not get it within 1 month of other vaccines) , then call us for a prescription  for it to take to a pharmacy that gives the shot , or make a nurse visit to get it here  depending on your coverage Here is some information on Evista - a medication to prevent fractures (broken bones) from osteoporosis  Try to schedule some exercise every day- try your exercise bike again  If you are interested- [facility] has a great diabetic education program- you can stop by the pharmacy and ask about it and they can give you information and talk about insurance coverage  Diabetes is slightly improved - keep working on diabetic diet and exercise  Follow up in 3 months with labs prior for diabetes

## 2015-05-16 NOTE — Patient Instructions (Signed)
Please do the stool kit for colon cancer screening  If you are interested in a shingles/zoster vaccine - call your insurance to check on coverage,( you should not get it within 1 month of other vaccines) , then call us for a prescription  for it to take to a pharmacy that gives the shot , or make a nurse visit to get it here depending on your coverage Here is some information on Evista - a medication to prevent fractures (broken bones) from osteoporosis  Try to schedule some exercise every day- try your exercise bike again  If you are interested- [facility] has a great diabetic education program- you can stop by the pharmacy and ask about it and they can give you information and talk about insurance coverage  Diabetes is slightly improved - keep working on diabetic diet and exercise  Follow up in 3 months with labs prior for diabetes

## 2015-05-16 NOTE — Progress Notes (Signed)
Pre visit review using our clinic review tool, if applicable. No additional management support is needed unless otherwise documented below in the visit note. 

## 2015-05-18 NOTE — Assessment & Plan Note (Signed)
Reviewed health habits including diet and exercise and skin cancer prevention Reviewed appropriate screening tests for age  Also reviewed health mt list, fam hx and immunization status , as well as social and family history   See HPI Labs reviewed  Please do the stool kit for colon cancer screening  If you are interested in a shingles/zoster vaccine - call your insurance to check on coverage,( you should not get it within 1 month of other vaccines) , then call us for a prescription  for it to take to a pharmacy that gives the shot , or make a nurse visit to get it here depending on your coverage Here is some information on Evista - a medication to prevent fractures (broken bones) from osteoporosis  Try to schedule some exercise every day- try your exercise bike again  If you are interested- [facility] has a great diabetic education program- you can stop by the pharmacy and ask about it and they can give you information and talk about insurance coverage  Diabetes is slightly improved - keep working on diabetic diet and exercise  Follow up in 3 months with labs prior for diabetes

## 2015-05-18 NOTE — Assessment & Plan Note (Signed)
Reviewed health habits including diet and exercise and skin cancer prevention Reviewed appropriate screening tests for age  Also reviewed health mt list, fam hx and immunization status , as well as social and family history   See HPI Labs reviewed  Please do the stool kit for colon cancer screening  If you are interested in a shingles/zoster vaccine - call your insurance to check on coverage,( you should not get it within 1 month of other vaccines) , then call us for a prescription  for it to take to a pharmacy that gives the shot , or make a nurse visit to get it here depending on your coverage Here is some information on Evista - a medication to prevent fractures (broken bones) from osteoporosis  Try to schedule some exercise every day- try your exercise bike again  If you are interested- [facility] has a great diabetic education program- you can stop by the pharmacy and ask about it and they can give you information and talk about insurance coverage  Diabetes is slightly improved - keep working on diabetic diet and exercise  Follow up in 3 months with labs prior for diabetes  

## 2015-05-18 NOTE — Assessment & Plan Note (Signed)
dexa 2014 and declines further  She has had more than one fx and qualifies for prev med  Intol of fosamax  Given information on evista to consider- if interested will call for px  Disc safety  Disc need for calcium/ vitamin D/ wt bearing exercise and bone density test every 2 y to monitor Disc safety/ fracture risk in detail

## 2015-05-18 NOTE — Assessment & Plan Note (Signed)
Given ifob screen kit

## 2015-05-18 NOTE — Assessment & Plan Note (Signed)
Lab Results  Component Value Date   HGBA1C 7.0* 05/09/2015   This is improved but not quite at goal Enc her to consider DM teaching at [facility]  Rev need for low glycemic diet/exercise and wt loss

## 2015-05-18 NOTE — Assessment & Plan Note (Signed)
bp in fair control at this time  BP Readings from Last 1 Encounters:  05/16/15 132/70   No changes needed Disc lifstyle change with low sodium diet and exercise  Labs reviewed

## 2015-05-18 NOTE — Assessment & Plan Note (Signed)
Disc goals for lipids and reasons to control them Rev labs with pt Rev low sat fat diet in detail Trig are still over goal likely due to DM Disc low glycemic diet as well

## 2015-05-18 NOTE — Assessment & Plan Note (Signed)
With poor balance and OP  Given handout on fall prev Enc walker/cane/assistance with ambulation

## 2015-05-27 ENCOUNTER — Other Ambulatory Visit: Payer: Self-pay | Admitting: *Deleted

## 2015-05-27 MED ORDER — METFORMIN HCL 1000 MG PO TABS: 1000.0000 mg | ORAL_TABLET | Freq: Two times a day (BID) | ORAL | Status: DC

## 2015-06-10 ENCOUNTER — Other Ambulatory Visit: Payer: Self-pay | Admitting: *Deleted

## 2015-06-10 MED ORDER — SIMVASTATIN 20 MG PO TABS: 20.0000 mg | ORAL_TABLET | Freq: Every day | ORAL | Status: DC

## 2015-06-10 MED ORDER — BUPROPION HCL ER (XL) 300 MG PO TB24
300.0000 mg | ORAL_TABLET | Freq: Every day | ORAL | Status: DC
Start: 2015-06-10 — End: 2016-02-24

## 2015-07-30 ENCOUNTER — Other Ambulatory Visit: Payer: Self-pay | Admitting: *Deleted

## 2015-07-30 MED ORDER — GLIPIZIDE ER 10 MG PO TB24: 10.0000 mg | ORAL_TABLET | Freq: Every day | ORAL | Status: DC

## 2015-07-30 MED ORDER — FLUOXETINE HCL 40 MG PO CAPS: 40.0000 mg | ORAL_CAPSULE | Freq: Every day | ORAL | Status: DC

## 2015-08-18 ENCOUNTER — Ambulatory Visit (INDEPENDENT_AMBULATORY_CARE_PROVIDER_SITE_OTHER): Payer: Medicare Other | Admitting: Family Medicine

## 2015-08-18 ENCOUNTER — Encounter: Payer: Self-pay | Admitting: Family Medicine

## 2015-08-18 VITALS — BP 128/60 | HR 79 | Temp 98.6°F | Wt 155.5 lb

## 2015-08-18 DIAGNOSIS — I10 Essential (primary) hypertension: Secondary | ICD-10-CM | POA: Diagnosis not present

## 2015-08-18 DIAGNOSIS — E119 Type 2 diabetes mellitus without complications: Secondary | ICD-10-CM | POA: Diagnosis not present

## 2015-08-18 NOTE — Assessment & Plan Note (Signed)
A1C today Pt is resistant to any lifestyle change or addition of more medication (or refresher DM teaching) She refuses to exercise based on knee pain  Overall very negative attitude today (questioned about depression)   Directions as follows : Review the diabetic diet information you have  try to stay as active as you can Cut the size of your carbohydrate servings (bread, pasta, potatoes, rice, fruit)  For example- eat only one piece of bread instead of 2 , and eat 1/2 piece of fruit  Make sure you get a protein serving with every meal- meat, fish, nuts, soy products, dairy products, beans  Make it a goal to only have sweets once per week in a small serving  No more juice  Try to stay more hydrated -more water To check glucose in the afternoon if needed warm hands under warm water - this may help you get a blood sampe   Also -[facility]   >25 minutes spent in face to face time with patient, >50% spent in counselling or coordination of care

## 2015-08-18 NOTE — Patient Instructions (Signed)
Blood pressure was better on the 2nd check  Review the diabetic diet information you have  try to stay as active as you can Cut the size of your carbohydrate servings (bread, pasta, potatoes, rice, fruit)  For example- eat only one piece of bread instead of 2 , and eat 1/2 piece of fruit  Make sure you get a protein serving with every meal- meat, fish, nuts, soy products, dairy products, beans  Make it a goal to only have sweets once per week in a small serving  No more juice  Try to stay more hydrated -more water To check glucose in the afternoon if needed warm hands under warm water - this may help you get a blood sampe   Also -[facility]

## 2015-08-18 NOTE — Progress Notes (Signed)
Pre visit review using our clinic review tool, if applicable. No additional management support is needed unless otherwise documented below in the visit note. 

## 2015-08-18 NOTE — Progress Notes (Signed)
Subjective:    Patient ID: Amanda Benson, female    DOB: [birth year], 80 y.o.   MRN: [id number]  HPI Here for f/u of chronic medical problems   Wt is stable   bp is up today  No cp or palpitations or headaches or edema  No side effects to medicines  BP Readings from Last 3 Encounters:  08/18/15 164/68  05/16/15 132/70  02/12/15 146/78   re check was 128/60    Diabetes Home sugar results - fasting ranges 120s-150s , tries to check afternoon and cannot get blood  DM diet - does not drink enough water  Trying to watch her diet for sugar and carbs -also cutting portions  Eats sweets once in a while  carbs- controls portions  Exercise - gets little (occ walk to grocery shop) started to use the bike for about 2 weeks)- made her knees hurt and back hurt Not open to going to diabetic teaching (daughter gave her some books on diabetic education)- has not looked at them yet  Symptoms-none  A1C last  Lab Results  Component Value Date   HGBA1C 7.0* 05/09/2015   Was 7.2  No problems with medications - metformin and glipizide XL Renal protection ARB Last eye exam 7/16     Patient Active Problem List   Diagnosis Date Noted  . Routine general medical examination at a health care facility 05/16/2015  . UTI (urinary tract infection) 10/29/2014  . Finger injury 05/10/2014  . Dysuria 05/10/2014  . Fracture of finger, distal phalanx, right, closed 05/10/2014  . Breast calcifications on mammogram 03/01/2014  . Encounter for Medicare annual wellness exam 05/30/2013  . Breast calcification seen on mammogram 12/14/2012  . Breast calcification, left 11/28/2012  . History of falling 11/27/2012  . Poor balance 11/27/2012  . Rash and nonspecific skin eruption 10/16/2012  . Colon cancer screening 05/10/2012  . Syncope 08/22/2011  . Osteoporosis 06/21/2007  . Diabetes type 2, controlled ([place]) 09/20/2006  . Hyperlipidemia 09/20/2006  . Essential hypertension 09/20/2006  . OSTEOARTHRITIS  09/20/2006   Past Medical History  Diagnosis Date  . Depression   . Diabetes mellitus     type II  . Hypertension   . Osteoporosis   . Hyperlipidemia   . Obesity    Past Surgical History  Procedure Laterality Date  . Cholecystectomy    . Ankle surgery      left medial malleblus fracture   Social History  Substance Use Topics  . Smoking status: Never Smoker   . Smokeless tobacco: None  . Alcohol Use: No   Family History  Problem Relation Age of Onset  . Diabetes Mother   . Hypertension Mother   . Asthma Father   . Hypertension Sister   . Cancer Sister     lung  . Asthma Brother   . COPD Brother   . Cancer Maternal Grandmother     breast   . Asthma Brother    Allergies  Allergen Reactions  . Alendronate Sodium     REACTION: heartburn   Current Outpatient Prescriptions on File Prior to Visit  Medication Sig Dispense Refill  . amLODipine (NORVASC) 10 MG tablet Take 1 tablet (10 mg total) by mouth daily. 30 tablet 11  . aspirin 81 MG EC tablet Take 81 mg by mouth daily.      . Blood Glucose Monitoring Suppl (ONE TOUCH ULTRA SYSTEM KIT) W/DEVICE KIT To check sugar daily and as needed for DM2 250.0  1  each 0  . buPROPion (WELLBUTRIN XL) 300 MG 24 hr tablet Take 1 tablet (300 mg total) by mouth daily. 90 tablet 2  . FLUoxetine (PROZAC) 40 MG capsule Take 1 capsule (40 mg total) by mouth daily. 90 capsule 1  . glipiZIDE (GLUCOTROL XL) 10 MG 24 hr tablet Take 1 tablet (10 mg total) by mouth daily. 90 tablet 1  . losartan-hydrochlorothiazide (HYZAAR) 100-25 MG per tablet Take 1 tablet by mouth daily. 30 tablet 11  . metFORMIN (GLUCOPHAGE) 1000 MG tablet Take 1 tablet (1,000 mg total) by mouth 2 (two) times daily with a meal. 180 tablet 1  . mometasone (ELOCON) 0.1 % cream Apply topically daily. To affected areas as needed 45 g 0  . simvastatin (ZOCOR) 20 MG tablet Take 1 tablet (20 mg total) by mouth daily. 90 tablet 2   No current facility-administered medications on  file prior to visit.     Review of Systems Review of Systems  Constitutional: Negative for fever, appetite change, fatigue and unexpected weight change.  Eyes: Negative for pain and visual disturbance.  Respiratory: Negative for cough and shortness of breath.   Cardiovascular: Negative for cp or palpitations    Gastrointestinal: Negative for nausea, diarrhea and constipation.  Genitourinary: Negative for urgency and frequency.  Skin: Negative for pallor or rash   Neurological: Negative for weakness, light-headedness, numbness and headaches.  Hematological: Negative for adenopathy. Does not bruise/bleed easily.  Psychiatric/Behavioral: Negative for dysphoric mood. The patient is not nervous/anxious.  pos for negativity about everything -denies joy of any kind but also denies hopelessness       Objective:   Physical Exam  Constitutional: She appears well-developed and well-nourished. No distress.  overwt and well appearing   HENT:  Head: Normocephalic and atraumatic.  Mouth/Throat: Oropharynx is clear and moist.  Eyes: Conjunctivae and EOM are normal. Pupils are equal, round, and reactive to light.  Neck: Normal range of motion. Neck supple. No JVD present. Carotid bruit is not present. No thyromegaly present.  Cardiovascular: Normal rate, regular rhythm, normal heart sounds and intact distal pulses.  Exam reveals no gallop.   Pulmonary/Chest: Effort normal and breath sounds normal. No respiratory distress. She has no wheezes. She has no rales.  No crackles  Abdominal: Soft. Bowel sounds are normal. She exhibits no distension, no abdominal bruit and no mass. There is no tenderness.  Musculoskeletal: She exhibits no edema.  Poor rom bilateral knees  Lymphadenopathy:    She has no cervical adenopathy.  Neurological: She is alert. She has normal reflexes.  Skin: Skin is warm and dry. No rash noted.  Psychiatric: She has a normal mood and affect.  Somewhat argumentative and also  negative today  At times does not answer questions at all  Helpful family is present            Assessment & Plan:   Problem List Items Addressed This Visit      Cardiovascular and Mediastinum   Essential hypertension    Better on 2nd check today bp in fair control at this time  BP Readings from Last 1 Encounters:  08/18/15 128/60   No changes needed Disc lifstyle change with low sodium diet and exercise          Endocrine   Diabetes type 2, controlled ([place]) - Primary    A1C today Pt is resistant to any lifestyle change or addition of more medication (or refresher DM teaching) She refuses to exercise based on knee pain  Overall very negative attitude today (questioned about depression)   Directions as follows : Review the diabetic diet information you have  try to stay as active as you can Cut the size of your carbohydrate servings (bread, pasta, potatoes, rice, fruit)  For example- eat only one piece of bread instead of 2 , and eat 1/2 piece of fruit  Make sure you get a protein serving with every meal- meat, fish, nuts, soy products, dairy products, beans  Make it a goal to only have sweets once per week in a small serving  No more juice  Try to stay more hydrated -more water To check glucose in the afternoon if needed warm hands under warm water - this may help you get a blood sampe   Also -if you change your mind go to [facility] and ask them about their diabetes education program   >25 minutes spent in face to face time with patient, >50% spent in counselling or coordination of care       Relevant Orders   Hemoglobin A1c

## 2015-08-18 NOTE — Assessment & Plan Note (Signed)
Better on 2nd check today bp in fair control at this time  BP Readings from Last 1 Encounters:  08/18/15 128/60   No changes needed Disc lifstyle change with low sodium diet and exercise

## 2015-08-19 LAB — HEMOGLOBIN A1C: HEMOGLOBIN A1C: 7.2 % — AB (ref 4.6–6.5)

## 2015-08-20 ENCOUNTER — Telehealth: Payer: Self-pay | Admitting: Family Medicine

## 2015-08-20 ENCOUNTER — Other Ambulatory Visit: Payer: Self-pay

## 2015-08-20 MED ORDER — AMLODIPINE BESYLATE 10 MG PO TABS: 10.0000 mg | ORAL_TABLET | Freq: Every day | ORAL | Status: DC

## 2015-08-20 NOTE — Telephone Encounter (Signed)
Pt was notified of results

## 2015-08-20 NOTE — Telephone Encounter (Signed)
Patient returned call about her lab results. °

## 2015-08-22 ENCOUNTER — Other Ambulatory Visit: Payer: Self-pay

## 2015-08-22 MED ORDER — LOSARTAN POTASSIUM-HCTZ 100-25 MG PO TABS: 1.0000 | ORAL_TABLET | Freq: Every day | ORAL | Status: DC

## 2015-08-22 NOTE — Telephone Encounter (Signed)
Mr Amanda Benson request refill losartan HCTZ to [facility] [address] Advised done per protocol. Pt seen 08/18/15. Mr Amanda Benson voiced understanding.

## 2015-08-28 MED ORDER — LOSARTAN POTASSIUM-HCTZ 100-25 MG PO TABS: 1.0000 | ORAL_TABLET | Freq: Every day | ORAL | Status: DC

## 2015-08-28 NOTE — Addendum Note (Signed)
Addended by: Shon MilletWATLINGTON, Kayshaun Polanco M on: 08/28/2015 09:56 AM   Modules accepted: Orders

## 2015-08-28 NOTE — Telephone Encounter (Signed)
Rena printed Rx instead of sending it in electronically, Rx sent to pharmacy

## 2015-11-09 ENCOUNTER — Telehealth: Payer: Self-pay | Admitting: Family Medicine

## 2015-11-09 DIAGNOSIS — E119 Type 2 diabetes mellitus without complications: Secondary | ICD-10-CM

## 2015-11-09 DIAGNOSIS — I10 Essential (primary) hypertension: Secondary | ICD-10-CM

## 2015-11-09 DIAGNOSIS — E785 Hyperlipidemia, unspecified: Secondary | ICD-10-CM

## 2015-11-09 NOTE — Telephone Encounter (Signed)
-----   Message from Alvina Chouerri J Walsh sent at 11/07/2015  8:53 AM EDT ----- Regarding: Lab orders for Wednesday, 8.9.17 Lab orders for a 3 month follow up appt.

## 2015-11-12 ENCOUNTER — Other Ambulatory Visit (INDEPENDENT_AMBULATORY_CARE_PROVIDER_SITE_OTHER): Payer: Medicare Other

## 2015-11-12 ENCOUNTER — Other Ambulatory Visit: Payer: Medicare Other

## 2015-11-12 DIAGNOSIS — I10 Essential (primary) hypertension: Secondary | ICD-10-CM | POA: Diagnosis not present

## 2015-11-12 DIAGNOSIS — E119 Type 2 diabetes mellitus without complications: Secondary | ICD-10-CM | POA: Diagnosis not present

## 2015-11-12 DIAGNOSIS — E785 Hyperlipidemia, unspecified: Secondary | ICD-10-CM

## 2015-11-12 LAB — HEMOGLOBIN A1C: Hgb A1c MFr Bld: 7 % — ABNORMAL HIGH (ref 4.6–6.5)

## 2015-11-12 LAB — COMPREHENSIVE METABOLIC PANEL
ALK PHOS: 65 U/L (ref 39–117)
ALT: 22 U/L (ref 0–35)
AST: 22 U/L (ref 0–37)
Albumin: 3.7 g/dL (ref 3.5–5.2)
BILIRUBIN TOTAL: 0.7 mg/dL (ref 0.2–1.2)
BUN: 20 mg/dL (ref 6–23)
CO2: 33 meq/L — AB (ref 19–32)
CREATININE: 0.93 mg/dL (ref 0.40–1.20)
Calcium: 10.1 mg/dL (ref 8.4–10.5)
Chloride: 99 mEq/L (ref 96–112)
GFR: 61.92 mL/min (ref >60.00)
GLUCOSE: 111 mg/dL — AB (ref 70–99)
Potassium: 3.7 mEq/L (ref 3.5–5.1)
Sodium: 138 mEq/L (ref 135–145)
TOTAL PROTEIN: 6.6 g/dL (ref 6.0–8.3)

## 2015-11-12 LAB — LIPID PANEL
CHOL/HDL RATIO: 2
Cholesterol: 131 mg/dL (ref 0–200)
HDL: 53.6 mg/dL (ref >39.00)
LDL CALC: 51 mg/dL (ref 0–99)
NONHDL: 77.09
Triglycerides: 128 mg/dL (ref 0.0–149.0)
VLDL: 25.6 mg/dL (ref 0.0–40.0)

## 2015-11-19 ENCOUNTER — Ambulatory Visit (INDEPENDENT_AMBULATORY_CARE_PROVIDER_SITE_OTHER): Payer: Medicare Other | Admitting: Family Medicine

## 2015-11-19 ENCOUNTER — Encounter: Payer: Self-pay | Admitting: Family Medicine

## 2015-11-19 VITALS — BP 136/78 | HR 72 | Temp 98.2°F | Wt 151.8 lb

## 2015-11-19 DIAGNOSIS — R350 Frequency of micturition: Secondary | ICD-10-CM

## 2015-11-19 DIAGNOSIS — I10 Essential (primary) hypertension: Secondary | ICD-10-CM | POA: Diagnosis not present

## 2015-11-19 DIAGNOSIS — E119 Type 2 diabetes mellitus without complications: Secondary | ICD-10-CM | POA: Diagnosis not present

## 2015-11-19 DIAGNOSIS — E785 Hyperlipidemia, unspecified: Secondary | ICD-10-CM | POA: Diagnosis not present

## 2015-11-19 DIAGNOSIS — M170 Bilateral primary osteoarthritis of knee: Secondary | ICD-10-CM

## 2015-11-19 LAB — POC URINALSYSI DIPSTICK (AUTOMATED)
Bilirubin, UA: NEGATIVE
GLUCOSE UA: NEGATIVE
Ketones, UA: NEGATIVE
NITRITE UA: POSITIVE
PH UA: 6.5
Protein, UA: NEGATIVE
SPEC GRAV UA: 1.015
Urobilinogen, UA: NEGATIVE

## 2015-11-19 NOTE — Progress Notes (Signed)
Pre visit review using our clinic review tool, if applicable. No additional management support is needed unless otherwise documented below in the visit note. 

## 2015-11-19 NOTE — Progress Notes (Signed)
Subjective:    Patient ID: Amanda Benson, female    DOB: 1935/08/11, 80 y.o.   MRN: [id number]  HPI Here for f/u of chronic medical problems   Feeling ok in general   Notices she has to run to the bathroom more frequently  All week  No burning  No blood in urine  No urine odor  No bladder pain or problems emptying -does have incontinence No fever or nausea  Has not checked blood glucose this week  Not very thirsty   Wt Readings from Last 3 Encounters:  11/19/15 151 lb 12 oz (68.8 kg)  08/18/15 155 lb 8 oz (70.5 kg)  05/16/15 155 lb (70.3 kg)  down 4 lb  She is trying to work on it  [employer] exercise due to knees  Tried to eat less carbs - that did not work out "she likes her food" - eating smaller portions in general  bmi is 27.1  bp is stable today  No cp or palpitations or headaches or edema  No side effects to medicines  BP Readings from Last 3 Encounters:  11/19/15 136/78  08/18/15 128/60  05/16/15 132/70      Diabetes Home sugar results -not checking blood sugars  DM diet -will not follow  Exercise -cannot  Symptoms-none except urinary frequency this week  A1C last  Lab Results  Component Value Date   HGBA1C 7.0 (H) 11/12/2015  this is down from 7.2-improved  No problems with medications -is on metformin and glipizide  Renal protection- ARB Last eye exam  -7/16- has not had it yet    Hyperlipidemia Lab Results  Component Value Date   CHOL 131 11/12/2015   CHOL 122 05/09/2015   CHOL 121 11/11/2014   Lab Results  Component Value Date   HDL 53.60 11/12/2015   HDL 52.30 05/09/2015   HDL 52.80 11/11/2014   Lab Results  Component Value Date   LDLCALC 51 11/12/2015   LDLCALC 34 05/09/2015   LDLCALC 46 11/11/2014   Lab Results  Component Value Date   TRIG 128.0 11/12/2015   TRIG 182.0 (H) 05/09/2015   TRIG 111.0 11/11/2014   Lab Results  Component Value Date   CHOLHDL 2 11/12/2015   CHOLHDL 2 05/09/2015   CHOLHDL 2 11/11/2014   Lab  Results  Component Value Date   LDLDIRECT 107.2 09/19/2009  simvastatin 20 mg and diet  Eating smaller portions   Knees hurt severely  Pt will not see ortho because she does not think she can afford to  Also refuses to use her walker - and has fallen multiple times I suggest calling ins to see if ortho consult would be covered-she really needs it   Patient Active Problem List   Diagnosis Date Noted  . Urine frequency 11/19/2015  . Routine general medical examination at a health care facility 05/16/2015  . Finger injury 05/10/2014  . Dysuria 05/10/2014  . Breast calcifications on mammogram 03/01/2014  . Encounter for Medicare annual wellness exam 05/30/2013  . Breast calcification seen on mammogram 12/14/2012  . Breast calcification, left 11/28/2012  . History of falling 11/27/2012  . Poor balance 11/27/2012  . Colon cancer screening 05/10/2012  . Syncope 08/22/2011  . Osteoporosis 06/21/2007  . Diabetes type 2, controlled ([place]) 09/20/2006  . Hyperlipidemia 09/20/2006  . Essential hypertension 09/20/2006  . Osteoarthritis 09/20/2006   Past Medical History:  Diagnosis Date  . Depression   . Diabetes mellitus    type II  .  Hyperlipidemia   . Hypertension   . Obesity   . Osteoporosis    Past Surgical History:  Procedure Laterality Date  . ANKLE SURGERY     left medial malleblus fracture  . CHOLECYSTECTOMY     Social History  Substance Use Topics  . Smoking status: Never Smoker  . Smokeless tobacco: Not on file  . Alcohol use No   Family History  Problem Relation Age of Onset  . Diabetes Mother   . Hypertension Mother   . Asthma Father   . Hypertension Sister   . Cancer Sister     lung  . Asthma Brother   . COPD Brother   . Cancer Maternal Grandmother     breast   . Asthma Brother    Allergies  Allergen Reactions  . Alendronate Sodium     REACTION: heartburn   Current Outpatient Prescriptions on File Prior to Visit  Medication Sig Dispense Refill    . amLODipine (NORVASC) 10 MG tablet Take 1 tablet (10 mg total) by mouth daily. 30 tablet 3  . aspirin 81 MG EC tablet Take 81 mg by mouth daily.      . Blood Glucose Monitoring Suppl (ONE TOUCH ULTRA SYSTEM KIT) W/DEVICE KIT To check sugar daily and as needed for DM2 250.0  1 each 0  . buPROPion (WELLBUTRIN XL) 300 MG 24 hr tablet Take 1 tablet (300 mg total) by mouth daily. 90 tablet 2  . FLUoxetine (PROZAC) 40 MG capsule Take 1 capsule (40 mg total) by mouth daily. 90 capsule 1  . glipiZIDE (GLUCOTROL XL) 10 MG 24 hr tablet Take 1 tablet (10 mg total) by mouth daily. 90 tablet 1  . losartan-hydrochlorothiazide (HYZAAR) 100-25 MG tablet Take 1 tablet by mouth daily. 30 tablet 11  . metFORMIN (GLUCOPHAGE) 1000 MG tablet Take 1 tablet (1,000 mg total) by mouth 2 (two) times daily with a meal. 180 tablet 1  . mometasone (ELOCON) 0.1 % cream Apply topically daily. To affected areas as needed 45 g 0  . simvastatin (ZOCOR) 20 MG tablet Take 1 tablet (20 mg total) by mouth daily. 90 tablet 2   No current facility-administered medications on file prior to visit.     Review of Systems Review of Systems  Constitutional: Negative for fever, appetite change, fatigue and unexpected weight change.  Eyes: Negative for pain and visual disturbance.  Respiratory: Negative for cough and shortness of breath.   Cardiovascular: Negative for cp or palpitations    Gastrointestinal: Negative for nausea, diarrhea and constipation.  Genitourinary: Negative for urgency and frequency.  Skin: Negative for pallor or rash   MSK pos for mod to severe knee pain with mobility limitations  Neurological: Negative for weakness, light-headedness, numbness and headaches.  Hematological: Negative for adenopathy. Does not bruise/bleed easily.  Psychiatric/Behavioral: Negative for dysphoric mood. The patient is not nervous/anxious.         Objective:   Physical Exam  Constitutional: She appears well-developed and  well-nourished. No distress.  Well appearing elderly female with mobility impairment   HENT:  Head: Normocephalic and atraumatic.  Mouth/Throat: Oropharynx is clear and moist.  Eyes: Conjunctivae and EOM are normal. Pupils are equal, round, and reactive to light.  Neck: Normal range of motion. Neck supple. No JVD present. Carotid bruit is not present. No thyromegaly present.  Cardiovascular: Normal rate, regular rhythm, normal heart sounds and intact distal pulses.  Exam reveals no gallop.   Pulmonary/Chest: Effort normal and breath sounds normal. No  respiratory distress. She has no wheezes. She has no rales.  No crackles  Abdominal: Soft. Bowel sounds are normal. She exhibits no distension, no abdominal bruit and no mass. There is no tenderness.  No suprapubic tenderness or fullness    Musculoskeletal: She exhibits no edema.  Lymphadenopathy:    She has no cervical adenopathy.  Neurological: She is alert. She has normal reflexes.  Skin: Skin is warm and dry. No rash noted.  Psychiatric: She has a normal mood and affect.  Seems depressed Will not answer any questions- she will shrug her shoulders and indicate she does not know  Declines most intervention and counseling for the most part           Assessment & Plan:   Problem List Items Addressed This Visit      Cardiovascular and Mediastinum   Essential hypertension - Primary    bp was up slightly today but has been well controlled  Will watch this  Suggested watching sodium intake  Activity as tolerated F/u 3 mo         Endocrine   Diabetes type 2, controlled ([place])    Lab Results  Component Value Date   HGBA1C 7.0 (H) 11/12/2015   slt improved Pt refuses to watch diet and cannot exercise due to mobility impairment  Continue current Will get her eye exam F/u 3 mo          Musculoskeletal and Integument   Osteoarthritis    Mod to severe knee pain with immobility and hx of falls Again recommend using a walker    Offered orthop referral- she declines for now saying she cannot afford it  Her daughter will look into coverage and let us know  Unsure if pt really wants to treat this -seems unmotivated She generally refuses to use a walking device  Handicapped parking paperwork done today        Other   Urine frequency    UA pos today Sent for cx       Relevant Orders   POCT Urinalysis Dipstick (Automated)   Urine culture   Hyperlipidemia    Disc goals for lipids and reasons to control them Rev labs with pt Rev low sat fat diet in detail Well controlled with diet and statin       Other Visit Diagnoses   None.

## 2015-11-19 NOTE — Patient Instructions (Addendum)
You are due for an eye exam-please make your appt I think you should see an orthopedic doctor eventually about your knee pain and mobility problems -perhaps you could call your your insurance provider I am happy to refer you to an orthopedic doctor if you want  We filled out the handicapped form  Use your walker if you need it  Blood pressure was up a bit-we need to watch that  Let's check a urinalysis on the way out to make sure you do not have an infectoin   Diabetes is slightly improved Cholesterol is well controlled  Follow up with me in 3 months with labs prior

## 2015-11-20 ENCOUNTER — Telehealth: Payer: Self-pay | Admitting: *Deleted

## 2015-11-20 NOTE — Assessment & Plan Note (Signed)
UA pos today Sent for cx

## 2015-11-20 NOTE — Telephone Encounter (Signed)
Left voicemail requesting pt or family to call office back

## 2015-11-20 NOTE — Assessment & Plan Note (Signed)
Mod to severe knee pain with immobility and hx of falls Again recommend using a walker  Offered orthop referral- she declines for now saying she cannot afford it  Her daughter will look into coverage and let us know  Unsure if pt really wants to treat this -seems unmotivated She generally refuses to use a walking device  Handicapped parking paperwork done today

## 2015-11-20 NOTE — Assessment & Plan Note (Signed)
Lab Results  Component Value Date   HGBA1C 7.0 (H) 11/12/2015   slt improved Pt refuses to watch diet and cannot exercise due to mobility impairment  Continue current Will get her eye exam F/u 3 mo

## 2015-11-20 NOTE — Assessment & Plan Note (Signed)
Disc goals for lipids and reasons to control them Rev labs with pt Rev low sat fat diet in detail Well controlled with diet and statin  

## 2015-11-20 NOTE — Telephone Encounter (Signed)
Pt notified of Dr. Tower's comments and verbalized understanding  

## 2015-11-20 NOTE — Telephone Encounter (Signed)
-----   Message from Judy PimpleMarne A Tower, MD sent at 11/20/2015 10:15 AM EDT ----- Please let family know that her urine did look possibly infected and I am waiting on a culture result to treat-thanks

## 2015-11-20 NOTE — Assessment & Plan Note (Signed)
bp was up slightly today but has been well controlled  Will watch this  Suggested watching sodium intake  Activity as tolerated F/u 3 mo

## 2015-11-21 LAB — URINE CULTURE: Colony Count: >=100000

## 2015-11-24 ENCOUNTER — Telehealth: Payer: Self-pay | Admitting: *Deleted

## 2015-11-24 MED ORDER — NITROFURANTOIN MONOHYD MACRO 100 MG PO CAPS: 100.0000 mg | ORAL_CAPSULE | Freq: Two times a day (BID) | ORAL | 0 refills | Status: DC

## 2015-11-24 NOTE — Telephone Encounter (Signed)
-----   Message from Judy PimpleMarne A Tower, MD sent at 11/23/2015 12:51 PM EDT ----- Urine has infection that is resistant to some antibiotics Drink lots of water Please send in macrobid 100 mg 1 po bid for 7 d  #14 no ref  Update me if no clinical improvement

## 2015-11-24 NOTE — Telephone Encounter (Signed)
Pt notified of UA results and Dr. Royden Purlower's comments, Rx sent to pharmacy

## 2015-11-25 ENCOUNTER — Other Ambulatory Visit: Payer: Self-pay | Admitting: Family Medicine

## 2015-12-15 ENCOUNTER — Other Ambulatory Visit: Payer: Self-pay | Admitting: Family Medicine

## 2016-01-13 LAB — HM DIABETES EYE EXAM

## 2016-01-13 LAB — HEMOGLOBIN A1C: Hemoglobin A1C: 6.8

## 2016-01-14 ENCOUNTER — Encounter: Payer: Self-pay | Admitting: Family Medicine

## 2016-01-20 ENCOUNTER — Other Ambulatory Visit: Payer: Self-pay | Admitting: Family Medicine

## 2016-01-27 ENCOUNTER — Other Ambulatory Visit: Payer: Self-pay | Admitting: Family Medicine

## 2016-02-18 ENCOUNTER — Other Ambulatory Visit (INDEPENDENT_AMBULATORY_CARE_PROVIDER_SITE_OTHER): Payer: Medicare Other

## 2016-02-18 DIAGNOSIS — E119 Type 2 diabetes mellitus without complications: Secondary | ICD-10-CM | POA: Diagnosis not present

## 2016-02-18 DIAGNOSIS — I10 Essential (primary) hypertension: Secondary | ICD-10-CM | POA: Diagnosis not present

## 2016-02-18 LAB — COMPREHENSIVE METABOLIC PANEL
ALK PHOS: 51 U/L (ref 39–117)
ALT: 15 U/L (ref 0–35)
AST: 17 U/L (ref 0–37)
Albumin: 3.7 g/dL (ref 3.5–5.2)
BUN: 22 mg/dL (ref 6–23)
CO2: 34 mEq/L — ABNORMAL HIGH (ref 19–32)
Calcium: 9.5 mg/dL (ref 8.4–10.5)
Chloride: 103 mEq/L (ref 96–112)
Creatinine, Ser: 0.97 mg/dL (ref 0.40–1.20)
GFR: 58.94 mL/min — AB (ref >60.00)
GLUCOSE: 117 mg/dL — AB (ref 70–99)
POTASSIUM: 3.6 meq/L (ref 3.5–5.1)
Sodium: 144 mEq/L (ref 135–145)
TOTAL PROTEIN: 6.6 g/dL (ref 6.0–8.3)
Total Bilirubin: 0.5 mg/dL (ref 0.2–1.2)

## 2016-02-18 LAB — LIPID PANEL
CHOL/HDL RATIO: 2
Cholesterol: 121 mg/dL (ref 0–200)
HDL: 51.8 mg/dL (ref >39.00)
LDL Cholesterol: 48 mg/dL (ref 0–99)
NonHDL: 69.44
TRIGLYCERIDES: 109 mg/dL (ref 0.0–149.0)
VLDL: 21.8 mg/dL (ref 0.0–40.0)

## 2016-02-18 LAB — HEMOGLOBIN A1C: HEMOGLOBIN A1C: 6.9 % — AB (ref 4.6–6.5)

## 2016-02-24 ENCOUNTER — Encounter: Payer: Self-pay | Admitting: Family Medicine

## 2016-02-24 ENCOUNTER — Ambulatory Visit (INDEPENDENT_AMBULATORY_CARE_PROVIDER_SITE_OTHER): Payer: Medicare Other | Admitting: Family Medicine

## 2016-02-24 VITALS — BP 132/65 | HR 71 | Temp 98.3°F | Wt 152.0 lb

## 2016-02-24 DIAGNOSIS — E785 Hyperlipidemia, unspecified: Secondary | ICD-10-CM

## 2016-02-24 DIAGNOSIS — I10 Essential (primary) hypertension: Secondary | ICD-10-CM

## 2016-02-24 DIAGNOSIS — Z23 Encounter for immunization: Secondary | ICD-10-CM

## 2016-02-24 DIAGNOSIS — E1169 Type 2 diabetes mellitus with other specified complication: Secondary | ICD-10-CM

## 2016-02-24 DIAGNOSIS — E119 Type 2 diabetes mellitus without complications: Secondary | ICD-10-CM

## 2016-02-24 MED ORDER — GLIPIZIDE ER 10 MG PO TB24: 10.0000 mg | ORAL_TABLET | Freq: Every day | ORAL | 3 refills | Status: DC

## 2016-02-24 MED ORDER — BUPROPION HCL ER (XL) 300 MG PO TB24: 300.0000 mg | ORAL_TABLET | Freq: Every day | ORAL | 3 refills | Status: DC

## 2016-02-24 MED ORDER — SIMVASTATIN 20 MG PO TABS: 20.0000 mg | ORAL_TABLET | Freq: Every day | ORAL | 3 refills | Status: DC

## 2016-02-24 MED ORDER — METFORMIN HCL 1000 MG PO TABS: 1000.0000 mg | ORAL_TABLET | Freq: Two times a day (BID) | ORAL | 3 refills | Status: DC

## 2016-02-24 MED ORDER — FLUOXETINE HCL 40 MG PO CAPS: 40.0000 mg | ORAL_CAPSULE | Freq: Every day | ORAL | 3 refills | Status: DC

## 2016-02-24 MED ORDER — AMLODIPINE BESYLATE 10 MG PO TABS: 10.0000 mg | ORAL_TABLET | Freq: Every day | ORAL | 11 refills | Status: DC

## 2016-02-24 NOTE — Progress Notes (Signed)
 Subjective:    Patient ID: Amanda Benson, female    DOB: 02/15/1936, 80 y.o.   MRN: [id number]  HPI Here for f/u of chronic medical problems   Feeling ok  Using walker for ambulation at home  It helps her knees  Still does not want to see orthopedics  More exercise lately =getting ready for the holidays   Wt Readings from Last 3 Encounters:  02/24/16 152 lb (68.9 kg)  11/19/15 151 lb 12 oz (68.8 kg)  08/18/15 155 lb 8 oz (70.5 kg)  weight is stable  bmi is 27.1  bp is up on first check today (has white coat anx)  No cp or palpitations or headaches or edema  No side effects to medicines  BP Readings from Last 3 Encounters:  02/24/16 (!) 152/74  11/19/15 136/78  08/18/15 128/60    Second check  BP: 132/65  Improved   Diabetes Home sugar results -no high or low readings (highest 165)  DM diet -no change /she watches diet the days her mood is good  When she gets upset - potatoes /big bowl  Gets protein from every meal  Exercise -getting more  Symptoms A1C last  Lab Results  Component Value Date   HGBA1C 6.9 (H) 02/18/2016  this is down from 7.0  No problems with medications -metformin and glipizide  Renal protection-arb Last eye exam  10/17 - will need cataract surgery   Hx of hyperlipidemia Lab Results  Component Value Date   CHOL 121 02/18/2016   CHOL 131 11/12/2015   CHOL 122 05/09/2015   Lab Results  Component Value Date   HDL 51.80 02/18/2016   HDL 53.60 11/12/2015   HDL 52.30 05/09/2015   Lab Results  Component Value Date   LDLCALC 48 02/18/2016   LDLCALC 51 11/12/2015   LDLCALC 34 05/09/2015   Lab Results  Component Value Date   TRIG 109.0 02/18/2016   TRIG 128.0 11/12/2015   TRIG 182.0 (H) 05/09/2015   Lab Results  Component Value Date   CHOLHDL 2 02/18/2016   CHOLHDL 2 11/12/2015   CHOLHDL 2 05/09/2015   Lab Results  Component Value Date   LDLDIRECT 107.2 09/19/2009   simvastatin and diet -well controlled  Patient Active  Problem List   Diagnosis Date Noted  . Urine frequency 11/19/2015  . Routine general medical examination at a health care facility 05/16/2015  . Finger injury 05/10/2014  . Dysuria 05/10/2014  . Breast calcifications on mammogram 03/01/2014  . Encounter for Medicare annual wellness exam 05/30/2013  . Breast calcification seen on mammogram 12/14/2012  . Breast calcification, left 11/28/2012  . History of falling 11/27/2012  . Poor balance 11/27/2012  . Colon cancer screening 05/10/2012  . Syncope 08/22/2011  . Osteoporosis 06/21/2007  . Diabetes type 2, controlled (HCC) 09/20/2006  . Hyperlipidemia associated with type 2 diabetes mellitus (HCC) 09/20/2006  . Essential hypertension 09/20/2006  . Osteoarthritis 09/20/2006   Past Medical History:  Diagnosis Date  . Depression   . Diabetes mellitus    type II  . Hyperlipidemia   . Hypertension   . Obesity   . Osteoporosis    Past Surgical History:  Procedure Laterality Date  . ANKLE SURGERY     left medial malleblus fracture  . CHOLECYSTECTOMY     Social History  Substance Use Topics  . Smoking status: Never Smoker  . Smokeless tobacco: Never Used  . Alcohol use No   Family History  Problem Relation   Age of Onset  . Diabetes Mother   . Hypertension Mother   . Asthma Father   . Hypertension Sister   . Cancer Sister     lung  . Asthma Brother   . COPD Brother   . Cancer Maternal Grandmother     breast   . Asthma Brother    Allergies  Allergen Reactions  . Alendronate Sodium     REACTION: heartburn   Current Outpatient Prescriptions on File Prior to Visit  Medication Sig Dispense Refill  . aspirin 81 MG EC tablet Take 81 mg by mouth daily.      . Blood Glucose Monitoring Suppl (ONE TOUCH ULTRA SYSTEM KIT) W/DEVICE KIT To check sugar daily and as needed for DM2 250.0  1 each 0  . losartan-hydrochlorothiazide (HYZAAR) 100-25 MG tablet Take 1 tablet by mouth daily. 30 tablet 11  . mometasone (ELOCON) 0.1 % cream  Apply topically daily. To affected areas as needed 45 g 0  . nitrofurantoin, macrocrystal-monohydrate, (MACROBID) 100 MG capsule Take 1 capsule (100 mg total) by mouth 2 (two) times daily. 14 capsule 0   No current facility-administered medications on file prior to visit.      Review of Systems Review of Systems  Constitutional: Negative for fever, appetite change, fatigue and unexpected weight change.  Eyes: Negative for pain and visual disturbance.  Respiratory: Negative for cough and shortness of breath.   Cardiovascular: Negative for cp or palpitations    Gastrointestinal: Negative for nausea, diarrhea and constipation.  Genitourinary: Negative for urgency and frequency.  Skin: Negative for pallor or rash   MSK pos for back and joint pain and stiffness  Neurological: Negative for weakness, light-headedness, numbness and headaches.  Hematological: Negative for adenopathy. Does not bruise/bleed easily.  Psychiatric/Behavioral: Negative for dysphoric mood. The patient is not nervous/anxious.  pos for stressors        Objective:   Physical Exam  Constitutional: She appears well-developed and well-nourished. No distress.  overwt and well appearing elderly female   HENT:  Head: Normocephalic and atraumatic.  Mouth/Throat: Oropharynx is clear and moist.  Eyes: Conjunctivae and EOM are normal. Pupils are equal, round, and reactive to light.  Neck: Normal range of motion. Neck supple. No JVD present. Carotid bruit is not present. No thyromegaly present.  Cardiovascular: Normal rate, regular rhythm, normal heart sounds and intact distal pulses.  Exam reveals no gallop.   Pulmonary/Chest: Effort normal and breath sounds normal. No respiratory distress. She has no wheezes. She has no rales.  No crackles  Abdominal: Soft. Bowel sounds are normal. She exhibits no distension, no abdominal bruit and no mass. There is no tenderness.  Musculoskeletal: She exhibits no edema.  Lymphadenopathy:     She has no cervical adenopathy.  Neurological: She is alert. She has normal reflexes.  Skin: Skin is warm and dry. No rash noted.  Psychiatric: She has a normal mood and affect.          Assessment & Plan:   Problem List Items Addressed This Visit      Cardiovascular and Mediastinum   Essential hypertension    bp in fair control at this time  BP Readings from Last 1 Encounters:  02/24/16 132/65   No changes needed Disc lifstyle change with low sodium diet and exercise  Labs reviewed  Enc exercise as tolerated       Relevant Medications   amLODipine (NORVASC) 10 MG tablet   simvastatin (ZOCOR) 20 MG tablet       Endocrine   Hyperlipidemia associated with type 2 diabetes mellitus ([place])    Disc goals for lipids and reasons to control them Rev labs with pt Rev low sat fat diet in detail  Continue simvastatin and diet       Relevant Medications   metFORMIN (GLUCOPHAGE) 1000 MG tablet   glipiZIDE (GLUCOTROL XL) 10 MG 24 hr tablet   amLODipine (NORVASC) 10 MG tablet   simvastatin (ZOCOR) 20 MG tablet   Diabetes type 2, controlled (HCC)    Lab Results  Component Value Date   HGBA1C 6.9 (H) 02/18/2016   Enc low glycemic diet  Disc eye exams  Activity as tolerated       Relevant Medications   metFORMIN (GLUCOPHAGE) 1000 MG tablet   glipiZIDE (GLUCOTROL XL) 10 MG 24 hr tablet   simvastatin (ZOCOR) 20 MG tablet    Other Visit Diagnoses    Need for influenza vaccination    -  Primary   Relevant Orders   Flu Vaccine QUAD 36+ mos IM (Completed)

## 2016-02-24 NOTE — Progress Notes (Signed)
Pre visit review using our clinic review tool, if applicable. No additional management support is needed unless otherwise documented below in the visit note. 

## 2016-02-24 NOTE — Patient Instructions (Addendum)
Keep working on reducing carbs- bread/potato/rice/pasta and fruit (just keep fruit servings small)  Also avoid sweets as much as you can  Blood pressure is improved on 2nd check   Plan your cataract surgery   Follow up in 6 months for annual exam with labs prior   Stay active and keep up the better exercise-walk with your walker  Flu shot today

## 2016-02-28 NOTE — Assessment & Plan Note (Signed)
Disc goals for lipids and reasons to control them Rev labs with pt Rev low sat fat diet in detail  Continue simvastatin and diet  

## 2016-02-28 NOTE — Assessment & Plan Note (Signed)
Lab Results  Component Value Date   HGBA1C 6.9 (H) 02/18/2016   Enc low glycemic diet  Disc eye exams  Activity as tolerated

## 2016-02-28 NOTE — Assessment & Plan Note (Signed)
bp in fair control at this time  BP Readings from Last 1 Encounters:  02/24/16 132/65   No changes needed Disc lifstyle change with low sodium diet and exercise  Labs reviewed  Enc exercise as tolerated

## 2016-08-11 ENCOUNTER — Telehealth: Payer: Self-pay | Admitting: Family Medicine

## 2016-08-11 DIAGNOSIS — Z0000 Encounter for general adult medical examination without abnormal findings: Secondary | ICD-10-CM

## 2016-08-11 DIAGNOSIS — E119 Type 2 diabetes mellitus without complications: Secondary | ICD-10-CM

## 2016-08-11 NOTE — Telephone Encounter (Signed)
-----   Message from Robert Bellowarlesia R Pinson, LPN sent at 1/6/10965/12/2016  3:50 PM EDT ----- Regarding: Lab orders 5/15 Please place lab orders.   [facility]

## 2016-08-17 ENCOUNTER — Ambulatory Visit (INDEPENDENT_AMBULATORY_CARE_PROVIDER_SITE_OTHER): Payer: PPO

## 2016-08-17 VITALS — BP 142/80 | HR 75 | Temp 98.5°F | Ht 63.0 in | Wt 150.5 lb

## 2016-08-17 DIAGNOSIS — E119 Type 2 diabetes mellitus without complications: Secondary | ICD-10-CM

## 2016-08-17 DIAGNOSIS — Z0000 Encounter for general adult medical examination without abnormal findings: Secondary | ICD-10-CM

## 2016-08-17 LAB — COMPREHENSIVE METABOLIC PANEL
ALT: 19 U/L (ref 0–35)
AST: 21 U/L (ref 0–37)
Albumin: 3.7 g/dL (ref 3.5–5.2)
Alkaline Phosphatase: 51 U/L (ref 39–117)
BUN: 18 mg/dL (ref 6–23)
CHLORIDE: 102 meq/L (ref 96–112)
CO2: 35 meq/L — AB (ref 19–32)
CREATININE: 0.93 mg/dL (ref 0.40–1.20)
Calcium: 10.2 mg/dL (ref 8.4–10.5)
GFR: 61.79 mL/min (ref >60.00)
Glucose, Bld: 168 mg/dL — ABNORMAL HIGH (ref 70–99)
Potassium: 4 mEq/L (ref 3.5–5.1)
SODIUM: 142 meq/L (ref 135–145)
Total Bilirubin: 0.6 mg/dL (ref 0.2–1.2)
Total Protein: 6.5 g/dL (ref 6.0–8.3)

## 2016-08-17 LAB — LIPID PANEL
CHOL/HDL RATIO: 2
Cholesterol: 123 mg/dL (ref 0–200)
HDL: 53.6 mg/dL (ref >39.00)
LDL CALC: 42 mg/dL (ref 0–99)
NONHDL: 69.3
Triglycerides: 137 mg/dL (ref 0.0–149.0)
VLDL: 27.4 mg/dL (ref 0.0–40.0)

## 2016-08-17 LAB — CBC WITH DIFFERENTIAL/PLATELET
BASOS PCT: 0.4 % (ref 0.0–3.0)
Basophils Absolute: 0 10*3/uL (ref 0.0–0.1)
EOS ABS: 0.2 10*3/uL (ref 0.0–0.7)
Eosinophils Relative: 2.6 % (ref 0.0–5.0)
HCT: 39.9 % (ref 36.0–46.0)
Hemoglobin: 13.3 g/dL (ref 12.0–15.0)
Lymphocytes Relative: 18.1 % (ref 12.0–46.0)
Lymphs Abs: 1.4 10*3/uL (ref 0.7–4.0)
MCHC: 33.4 g/dL (ref 30.0–36.0)
MCV: 88.6 fl (ref 78.0–100.0)
MONO ABS: 0.6 10*3/uL (ref 0.1–1.0)
Monocytes Relative: 7.9 % (ref 3.0–12.0)
NEUTROS ABS: 5.3 10*3/uL (ref 1.4–7.7)
Neutrophils Relative %: 71 % (ref 43.0–77.0)
PLATELETS: 302 10*3/uL (ref 150.0–400.0)
RBC: 4.5 Mil/uL (ref 3.87–5.11)
RDW: 13.5 % (ref 11.5–15.5)
WBC: 7.5 10*3/uL (ref 4.0–10.5)

## 2016-08-17 LAB — HEMOGLOBIN A1C: Hgb A1c MFr Bld: 7.1 % — ABNORMAL HIGH (ref 4.6–6.5)

## 2016-08-17 LAB — TSH: TSH: 2.74 u[IU]/mL (ref 0.35–4.50)

## 2016-08-17 NOTE — Progress Notes (Signed)
Subjective:   Amanda Benson is a 81 y.o. female who presents for Medicare Annual (Subsequent) preventive examination.  Review of Systems:  N/A Cardiac Risk Factors include: advanced age (>65mn, >>34women);diabetes mellitus;dyslipidemia;hypertension     Objective:     Vitals: BP (!) 142/80 (BP Location: Right Arm, Patient Position: Sitting, Cuff Size: Normal) Comment: no BP meds taken  Pulse 75   Temp 98.5 F (36.9 C) (Oral)   Ht '5\' 3"'$  (1.6 m) Comment: no shoes  Wt 150 lb 8 oz (68.3 kg)   SpO2 96%   BMI 26.66 kg/m   Body mass index is 26.66 kg/m.   Tobacco History  Smoking Status  . Never Smoker  Smokeless Tobacco  . Never Used     Counseling given: No   Past Medical History:  Diagnosis Date  . Depression   . Diabetes mellitus    type II  . Hyperlipidemia   . Hypertension   . Obesity   . Osteoporosis    Past Surgical History:  Procedure Laterality Date  . ANKLE SURGERY     left medial malleblus fracture  . CHOLECYSTECTOMY     Family History  Problem Relation Age of Onset  . Diabetes Mother   . Hypertension Mother   . Asthma Father   . Hypertension Sister   . Cancer Sister        lung  . Asthma Brother   . COPD Brother   . Cancer Maternal Grandmother        breast   . Asthma Brother    History  Sexual Activity  . Sexual activity: Not on file    Outpatient Encounter Prescriptions as of 08/17/2016  Medication Sig  . amLODipine (NORVASC) 10 MG tablet Take 1 tablet (10 mg total) by mouth daily.  .Marland Kitchenaspirin 81 MG EC tablet Take 81 mg by mouth daily.    . Blood Glucose Monitoring Suppl (ONE TOUCH ULTRA SYSTEM KIT) W/DEVICE KIT To check sugar daily and as needed for DM2 250.0   . buPROPion (WELLBUTRIN XL) 300 MG 24 hr tablet Take 1 tablet (300 mg total) by mouth daily.  .Marland KitchenFLUoxetine (PROZAC) 40 MG capsule Take 1 capsule (40 mg total) by mouth daily.  .Marland KitchenglipiZIDE (GLUCOTROL XL) 10 MG 24 hr tablet Take 1 tablet (10 mg total) by mouth daily.  .Marland Kitchen losartan-hydrochlorothiazide (HYZAAR) 100-25 MG tablet Take 1 tablet by mouth daily.  . metFORMIN (GLUCOPHAGE) 1000 MG tablet Take 1 tablet (1,000 mg total) by mouth 2 (two) times daily with a meal.  . simvastatin (ZOCOR) 20 MG tablet Take 1 tablet (20 mg total) by mouth daily.  . [DISCONTINUED] mometasone (ELOCON) 0.1 % cream Apply topically daily. To affected areas as needed  . [DISCONTINUED] nitrofurantoin, macrocrystal-monohydrate, (MACROBID) 100 MG capsule Take 1 capsule (100 mg total) by mouth 2 (two) times daily.   No facility-administered encounter medications on file as of 08/17/2016.     Activities of Daily Living In your present state of health, do you have any difficulty performing the following activities: 08/17/2016  Hearing? Y  Vision? Y  Difficulty concentrating or making decisions? Y  Walking or climbing stairs? Y  Dressing or bathing? N  Doing errands, shopping? Y  Preparing Food and eating ? N  Using the Toilet? N  In the past six months, have you accidently leaked urine? N  Do you have problems with loss of bowel control? N  Managing your Medications? Y  Managing your Finances?  Y  Housekeeping or managing your Housekeeping? Y  Some recent data might be hidden    Patient Care Team: Tower, [name], MD as PCP - General    Assessment:    Hearing Screening Comments: Bilateral hearing aids Vision Screening Comments: Last vision exam with Dr. Eula Flax @ [facility] in Oct 2017  Exercise Activities and Dietary recommendations Current Exercise Habits: The patient does not participate in regular exercise at present, Exercise limited by: None identified  Goals    . other          When weather permits, I will continue to enjoy sitting in my swing.       Fall Risk Fall Risk  08/17/2016 05/16/2015 05/30/2013 05/11/2012  Falls in the past year? Yes No Yes Yes  Number falls in past yr: 2 or more - 2 or more 1  Injury with Fall? No - - -  Risk Factor Category   - - High Fall Risk -  Risk for fall due to : Impaired balance/gait;Impaired mobility - - Impaired balance/gait   Depression Screen PHQ 2/9 Scores 08/17/2016 05/16/2015 05/30/2013 05/11/2012  PHQ - 2 Score 1 0 0 0     Cognitive Function MMSE - Mini Mental State Exam 08/17/2016  Orientation to time 5  Orientation to Place 5  Registration 3  Attention/ Calculation 0  Recall 3  Language- name 2 objects 0  Language- repeat 1  Language- follow 3 step command 3  Language- read & follow direction 0  Write a sentence 0  Copy design 0  Total score 20     PLEASE NOTE: A Mini-Cog screen was completed. Maximum score is 20. A value of 0 denotes this part of Folstein MMSE was not completed or the patient failed this part of the Mini-Cog screening.   Mini-Cog Screening Orientation to Time - Max 5 pts Orientation to Place - Max 5 pts Registration - Max 3 pts Recall - Max 3 pts Language Repeat - Max 1 pts Language Follow 3 Step Command - Max 3 pts     Immunization History  Administered Date(s) Administered  . Influenza Split 02/18/2011  . Influenza Whole 01/12/2002, 04/08/2009, 01/05/2010  . Influenza,inj,Quad PF,36+ Mos 05/10/2014, 02/12/2015, 02/24/2016  . Pneumococcal Conjugate-13 02/12/2015  . Pneumococcal Polysaccharide-23 11/26/2011  . Td 10/30/2002, 05/10/2012   Screening Tests Health Maintenance  Topic Date Due  . MAMMOGRAM  05/07/2020 (Originally 03/22/2015)  . INFLUENZA VACCINE  11/03/2016  . OPHTHALMOLOGY EXAM  01/12/2017  . HEMOGLOBIN A1C  02/17/2017  . FOOT EXAM  02/23/2017  . TETANUS/TDAP  05/10/2022  . DEXA SCAN  Completed  . PNA vac Low Risk Adult  Completed      Plan:     I have personally reviewed and addressed the Medicare Annual Wellness questionnaire and have noted the following in the patient's chart:  A. Medical and social history B. Use of alcohol, tobacco or illicit drugs  C. Current medications and supplements D. Functional ability and status E.    Nutritional status F.  Physical activity G. Advance directives H. List of other physicians I.  Hospitalizations, surgeries, and ER visits in previous 12 months J.  [place] to include hearing, vision, cognitive, depression L. Referrals and appointments - none  In addition, I have reviewed and discussed with patient certain preventive protocols, quality metrics, and best practice recommendations. A written personalized care plan for preventive services as well as general preventive health recommendations were provided to patient.  See attached scanned questionnaire for additional information.   Signed,   Lindell Noe, MHA, BS, LPN Health Coach

## 2016-08-17 NOTE — Progress Notes (Signed)
Pre visit review using our clinic review tool, if applicable. No additional management support is needed unless otherwise documented below in the visit note. 

## 2016-08-17 NOTE — Progress Notes (Signed)
PCP notes:   Health maintenance:  A1C - completed  Abnormal screenings:   Fall risk - hx of multiple falls with no injury; no medical treatment Depression score: 1  Patient concerns:   None  Nurse concerns:  None  Next PCP appt:   08/24/16 @ 1430   I reviewed health advisor's note, was available for consultation on the day of service listed in this note, and agree with documentation and plan. Crawford GivensGraham Duncan, MD.

## 2016-08-17 NOTE — Patient Instructions (Signed)
Amanda Benson , Thank you for taking time to come for your Medicare Wellness Visit. I appreciate your ongoing commitment to your health goals. Please review the following plan we discussed and let me know if I can assist you in the future.   These are the goals we discussed: Goals    . other          When weather permits, I will continue to enjoy sitting in my swing.        This is a list of the screening recommended for you and due dates:  Health Maintenance  Topic Date Due  . Mammogram  05/07/2020*  . Flu Shot  11/03/2016  . Eye exam for diabetics  01/12/2017  . Hemoglobin A1C  02/17/2017  . Complete foot exam   02/23/2017  . Tetanus Vaccine  05/10/2022  . DEXA scan (bone density measurement)  Completed  . Pneumonia vaccines  Completed  *Topic was postponed. The date shown is not the original due date.   Preventive Care for Adults  A healthy lifestyle and preventive care can promote health and wellness. Preventive health guidelines for adults include the following key practices.  . A routine yearly physical is a good way to check with your health care provider about your health and preventive screening. It is a chance to share any concerns and updates on your health and to receive a thorough exam.  . Visit your dentist for a routine exam and preventive care every 6 months. Brush your teeth twice a day and floss once a day. Good oral hygiene prevents tooth decay and gum disease.  . The frequency of eye exams is based on your age, health, family medical history, use  of contact lenses, and other factors. Follow your health care provider's ecommendations for frequency of eye exams.  . Eat a healthy diet. Foods like vegetables, fruits, whole grains, low-fat dairy products, and lean protein foods contain the nutrients you need without too many calories. Decrease your intake of foods high in solid fats, added sugars, and salt. Eat the right amount of calories for you. Get information  about a proper diet from your health care provider, if necessary.  . Regular physical exercise is one of the most important things you can do for your health. Most adults should get at least 150 minutes of moderate-intensity exercise (any activity that increases your heart rate and causes you to sweat) each week. In addition, most adults need muscle-strengthening exercises on 2 or more days a week.  Silver Sneakers may be a benefit available to you. To determine eligibility, you may visit the website: www.silversneakers.com or contact program at [phone] Mon-Fri between 8AM-8PM.   . Maintain a healthy weight. The body mass index (BMI) is a screening tool to identify possible weight problems. It provides an estimate of body fat based on height and weight. Your health care provider can find your BMI and can help you achieve or maintain a healthy weight.   For adults 20 years and older: ? A BMI below 18.5 is considered underweight. ? A BMI of 18.5 to 24.9 is normal. ? A BMI of 25 to 29.9 is considered overweight. ? A BMI of 30 and above is considered obese.   . Maintain normal blood lipids and cholesterol levels by exercising and minimizing your intake of saturated fat. Eat a balanced diet with plenty of fruit and vegetables. Blood tests for lipids and cholesterol should begin at age 79 and be repeated every 5  years. If your lipid or cholesterol levels are high, you are over 50, or you are at high risk for heart disease, you may need your cholesterol levels checked more frequently. Ongoing high lipid and cholesterol levels should be treated with medicines if diet and exercise are not working.  . If you smoke, find out from your health care provider how to quit. If you do not use tobacco, please do not start.  . If you choose to drink alcohol, please do not consume more than 2 drinks per day. One drink is considered to be 12 ounces (355 mL) of beer, 5 ounces (148 mL) of wine, or 1.5 ounces (44  mL) of liquor.  . If you are 63-42 years old, ask your health care provider if you should take aspirin to prevent strokes.  . Use sunscreen. Apply sunscreen liberally and repeatedly throughout the day. You should seek shade when your shadow is shorter than you. Protect yourself by wearing long sleeves, pants, a wide-brimmed hat, and sunglasses year round, whenever you are outdoors.  . Once a month, do a whole body skin exam, using a mirror to look at the skin on your back. Tell your health care provider of new moles, moles that have irregular borders, moles that are larger than a pencil eraser, or moles that have changed in shape or color.

## 2016-08-24 ENCOUNTER — Ambulatory Visit (INDEPENDENT_AMBULATORY_CARE_PROVIDER_SITE_OTHER): Payer: PPO | Admitting: Family Medicine

## 2016-08-24 ENCOUNTER — Encounter: Payer: Self-pay | Admitting: Family Medicine

## 2016-08-24 VITALS — BP 138/60 | HR 84 | Temp 99.2°F | Ht 63.0 in | Wt 152.2 lb

## 2016-08-24 DIAGNOSIS — Z1211 Encounter for screening for malignant neoplasm of colon: Secondary | ICD-10-CM

## 2016-08-24 DIAGNOSIS — M810 Age-related osteoporosis without current pathological fracture: Secondary | ICD-10-CM | POA: Diagnosis not present

## 2016-08-24 DIAGNOSIS — R296 Repeated falls: Secondary | ICD-10-CM | POA: Insufficient documentation

## 2016-08-24 DIAGNOSIS — I10 Essential (primary) hypertension: Secondary | ICD-10-CM

## 2016-08-24 DIAGNOSIS — E785 Hyperlipidemia, unspecified: Secondary | ICD-10-CM

## 2016-08-24 DIAGNOSIS — R531 Weakness: Secondary | ICD-10-CM | POA: Diagnosis not present

## 2016-08-24 DIAGNOSIS — E119 Type 2 diabetes mellitus without complications: Secondary | ICD-10-CM

## 2016-08-24 DIAGNOSIS — F32A Depression, unspecified: Secondary | ICD-10-CM | POA: Insufficient documentation

## 2016-08-24 DIAGNOSIS — Z0000 Encounter for general adult medical examination without abnormal findings: Secondary | ICD-10-CM | POA: Diagnosis not present

## 2016-08-24 DIAGNOSIS — E1169 Type 2 diabetes mellitus with other specified complication: Secondary | ICD-10-CM | POA: Diagnosis not present

## 2016-08-24 DIAGNOSIS — F418 Other specified anxiety disorders: Secondary | ICD-10-CM

## 2016-08-24 MED ORDER — RALOXIFENE HCL 60 MG PO TABS: 60.0000 mg | ORAL_TABLET | Freq: Every day | ORAL | 11 refills | Status: DC

## 2016-08-24 MED ORDER — LOSARTAN POTASSIUM-HCTZ 100-25 MG PO TABS: 1.0000 | ORAL_TABLET | Freq: Every day | ORAL | 11 refills | Status: DC

## 2016-08-24 NOTE — Assessment & Plan Note (Signed)
Declines colon screening at her age

## 2016-08-24 NOTE — Assessment & Plan Note (Signed)
Agreed to try evista Rev last dexa Multiple falls/no fractures  Disc poss side effects She is not interested in further dexa On ca and D

## 2016-08-24 NOTE — Assessment & Plan Note (Signed)
With poor balance- causing falls Ref to PT for balance and strength training

## 2016-08-24 NOTE — Patient Instructions (Addendum)
We will refer you to physical therapy for strength and fall prevention   Start generic Evista one pill daily-this is to reduce risk of fracture  Stay on your calcium and vitamin D  If you are interested in the new shingles vaccine (Shingrix) - call your insurance to check on coverage,( you should not get it within 1 month of other vaccines) , then call us for a prescription  for it to take to a pharmacy that gives the shot , or make a nurse visit to get it here depending on your coverage   Start using your exercise bike and increase duration gradually   [facility]  Your sugar is up  Please avoid excessive processed carbs - bread/pasta/rice/snack foods/ sweets  Get your carbohydrates from produce (choose sweet potatoes instead of white potatoes)   Follow up in 3 months

## 2016-08-24 NOTE — Progress Notes (Signed)
Subjective:    Patient ID: Amanda Benson, female    DOB: 1935/07/03, 81 y.o.   MRN: [id number]  HPI Here for health maintenance exam and to review chronic medical problems    amw was 5/15 Hx of falls w/o injury She is having more falls or near falls  States her "knees give out on her" - even when using walker (knees don't hurt she just gets weak: Depression score is 1   Wt Readings from Last 3 Encounters:  08/24/16 152 lb 4 oz (69.1 kg)  08/17/16 150 lb 8 oz (68.3 kg)  02/24/16 152 lb (68.9 kg)  bmi is 26.9  She declines mammograms/breast cancer screening  No lumps or concerns on self exam   dexa 2/14- osteopenia in hips and OP in forearm  Has declined subsequent dexa tests Intolerant of fosamax Given info on evista in the past- she is open to that now  Taking ca and vit D Very little exercise   Zoster status - may consider Shingrix if it is covered   Colon cancer screening neg ifob 3/09 Not interested in colon cancer screening   bp is up on first check today  No cp or palpitations or headaches or edema  No side effects to medicines  BP Readings from Last 3 Encounters:  08/24/16 (!) 152/58  08/17/16 (!) 142/80  02/24/16 132/65    Better on 2nd check today BP: 138/60     DM2 Lab Results  Component Value Date   HGBA1C 7.1 (H) 08/17/2016  diet has not changed / does not eat a lot   This is up from 6.9  Mood -fair/no change   Hx of hyperlipidemia Lab Results  Component Value Date   CHOL 123 08/17/2016   CHOL 121 02/18/2016   CHOL 131 11/12/2015   Lab Results  Component Value Date   HDL 53.60 08/17/2016   HDL 51.80 02/18/2016   HDL 53.60 11/12/2015   Lab Results  Component Value Date   LDLCALC 42 08/17/2016   LDLCALC 48 02/18/2016   LDLCALC 51 11/12/2015   Lab Results  Component Value Date   TRIG 137.0 08/17/2016   TRIG 109.0 02/18/2016   TRIG 128.0 11/12/2015   Lab Results  Component Value Date   CHOLHDL 2 08/17/2016   CHOLHDL 2  02/18/2016   CHOLHDL 2 11/12/2015   Lab Results  Component Value Date   LDLDIRECT 107.2 09/19/2009   Taking simvastatin 20 mg   Patient Active Problem List   Diagnosis Date Noted  . Depression with anxiety 08/24/2016  . Falls frequently 08/24/2016  . Generalized weakness 08/24/2016  . Urine frequency 11/19/2015  . Routine general medical examination at a health care facility 05/16/2015  . Dysuria 05/10/2014  . Breast calcifications on mammogram 03/01/2014  . Encounter for Medicare annual wellness exam 05/30/2013  . Breast calcification seen on mammogram 12/14/2012  . Breast calcification, left 11/28/2012  . History of falling 11/27/2012  . Poor balance 11/27/2012  . Colon cancer screening 05/10/2012  . Syncope 08/22/2011  . Osteoporosis 06/21/2007  . Diabetes type 2, controlled ([place]) 09/20/2006  . Hyperlipidemia associated with type 2 diabetes mellitus ([place]) 09/20/2006  . Essential hypertension 09/20/2006  . Osteoarthritis 09/20/2006   Past Medical History:  Diagnosis Date  . Depression   . Diabetes mellitus    type II  . Hyperlipidemia   . Hypertension   . Obesity   . Osteoporosis    Past Surgical History:  Procedure Laterality Date  .  ANKLE SURGERY     left medial malleblus fracture  . CHOLECYSTECTOMY     Social History  Substance Use Topics  . Smoking status: Never Smoker  . Smokeless tobacco: Never Used  . Alcohol use No   Family History  Problem Relation Age of Onset  . Diabetes Mother   . Hypertension Mother   . Asthma Father   . Hypertension Sister   . Cancer Sister        lung  . Asthma Brother   . COPD Brother   . Cancer Maternal Grandmother        breast   . Asthma Brother    Allergies  Allergen Reactions  . Alendronate Sodium     REACTION: heartburn   Current Outpatient Prescriptions on File Prior to Visit  Medication Sig Dispense Refill  . amLODipine (NORVASC) 10 MG tablet Take 1 tablet (10 mg total) by mouth daily. 30 tablet 11    . aspirin 81 MG EC tablet Take 81 mg by mouth daily.      . Blood Glucose Monitoring Suppl (ONE TOUCH ULTRA SYSTEM KIT) W/DEVICE KIT To check sugar daily and as needed for DM2 250.0  1 each 0  . buPROPion (WELLBUTRIN XL) 300 MG 24 hr tablet Take 1 tablet (300 mg total) by mouth daily. 90 tablet 3  . FLUoxetine (PROZAC) 40 MG capsule Take 1 capsule (40 mg total) by mouth daily. 90 capsule 3  . glipiZIDE (GLUCOTROL XL) 10 MG 24 hr tablet Take 1 tablet (10 mg total) by mouth daily. 90 tablet 3  . metFORMIN (GLUCOPHAGE) 1000 MG tablet Take 1 tablet (1,000 mg total) by mouth 2 (two) times daily with a meal. 180 tablet 3  . simvastatin (ZOCOR) 20 MG tablet Take 1 tablet (20 mg total) by mouth daily. 90 tablet 3   No current facility-administered medications on file prior to visit.      Review of Systems Review of Systems  Constitutional: Negative for fever, appetite change, fatigue and unexpected weight change.  Eyes: Negative for pain and visual disturbance.  Respiratory: Negative for cough and shortness of breath.   Cardiovascular: Negative for cp or palpitations    Gastrointestinal: Negative for nausea, diarrhea and constipation.  Genitourinary: Negative for urgency and frequency.  Skin: Negative for pallor or rash   Neurological: Negative for weakness, light-headedness, numbness and headaches. pos for poor balance and falls MSK pos for weakness in legs/general  Hematological: Negative for adenopathy. Does not bruise/bleed easily.  Psychiatric/Behavioral: pos for stable anx/dep symptoms         Objective:   Physical Exam  Constitutional: She appears well-developed and well-nourished. No distress.  Frail appearing elderly female  HENT:  Head: Normocephalic and atraumatic.  Right Ear: External ear normal.  Left Ear: External ear normal.  Nose: Nose normal.  Mouth/Throat: Oropharynx is clear and moist.  Eyes: Conjunctivae and EOM are normal. Pupils are equal, round, and reactive to  light. Right eye exhibits no discharge. Left eye exhibits no discharge. No scleral icterus.  Neck: Normal range of motion. Neck supple. No JVD present. Carotid bruit is not present. No thyromegaly present.  Cardiovascular: Normal rate, regular rhythm, normal heart sounds and intact distal pulses.  Exam reveals no gallop.   Pulmonary/Chest: Effort normal and breath sounds normal. No respiratory distress. She has no wheezes. She has no rales.  Abdominal: Soft. Bowel sounds are normal. She exhibits no distension and no mass. There is no tenderness.  Genitourinary:  Genitourinary  Comments: Declines breast exam  Musculoskeletal: She exhibits no edema or tenderness.  Mld kyphosis i  Lymphadenopathy:    She has no cervical adenopathy.  Neurological: She is alert. She has normal reflexes. No cranial nerve deficit. She exhibits normal muscle tone. Coordination normal.  Poor balance  Needs help of 2 people to get on table  Seems generally weak/non focal  Skin: Skin is warm and dry. No rash noted. No erythema. No pallor.  Small comedone on back extracted   sks diffusely  Psychiatric: She has a normal mood and affect.          Assessment & Plan:   Problem List Items Addressed This Visit      Cardiovascular and Mediastinum   Essential hypertension - Primary    bp in fair control at this time  BP Readings from Last 1 Encounters:  08/24/16 138/60   No changes needed Disc lifstyle change with low sodium diet and exercise  Improved on 2nd check      Relevant Medications   losartan-hydrochlorothiazide (HYZAAR) 100-25 MG tablet     Endocrine   Diabetes type 2, controlled ([place])    Lab Results  Component Value Date   HGBA1C 7.1 (H) 08/17/2016   This is up  Not watching diet  Disc low glycemic diet /recommend dM class at [facility] again  Unable to exercise currently  F/u 3 mo       Relevant Medications   losartan-hydrochlorothiazide (HYZAAR) 100-25 MG tablet   Hyperlipidemia  associated with type 2 diabetes mellitus ([place])    Disc goals for lipids and reasons to control them Rev labs with pt Rev low sat fat diet in detail At goal with simvastatin and diet/ LDL under 70      Relevant Medications   losartan-hydrochlorothiazide (HYZAAR) 100-25 MG tablet     Musculoskeletal and Integument   Osteoporosis    Agreed to try evista Rev last dexa Multiple falls/no fractures  Disc poss side effects She is not interested in further dexa On ca and D      Relevant Medications   raloxifene (EVISTA) 60 MG tablet     Other   Colon cancer screening    Declines colon screening at her age      Depression with anxiety    Fairly stable on prozac and wellbutrin      Falls frequently    Pt states that knees go out/no pain  Suspect generalized weakness ? Age related balance loss Uses a walker  Ref to PT for gait training/strength/vestibular help  Continue to monitor        Relevant Orders   Ambulatory referral to Physical Therapy   Generalized weakness    With poor balance- causing falls Ref to PT for balance and strength training       Relevant Orders   Ambulatory referral to Physical Therapy   Routine general medical examination at a health care facility    Reviewed health habits including diet and exercise and skin cancer prevention Reviewed appropriate screening tests for age  Also reviewed health mt list, fam hx and immunization status , as well as social and family history   See HPI Labs rev AMW rev  Ref to PT for falls/balance and weakness  Declines breast or colon cancer screening  Declines dexa but open to starting evista for OP Will check on coverage of shingrix

## 2016-08-24 NOTE — Assessment & Plan Note (Signed)
Lab Results  Component Value Date   HGBA1C 7.1 (H) 08/17/2016   This is up  Not watching diet  Disc low glycemic diet /[facility]  Unable to exercise currently  F/u 3 mo

## 2016-08-24 NOTE — Assessment & Plan Note (Signed)
Reviewed health habits including diet and exercise and skin cancer prevention Reviewed appropriate screening tests for age  Also reviewed health mt list, fam hx and immunization status , as well as social and family history   See HPI Labs rev AMW rev  Ref to PT for falls/balance and weakness  Declines breast or colon cancer screening  Declines dexa but open to starting evista for OP Will check on coverage of shingrix

## 2016-08-24 NOTE — Assessment & Plan Note (Signed)
Disc goals for lipids and reasons to control them Rev labs with pt Rev low sat fat diet in detail At goal with simvastatin and diet/ LDL under 70

## 2016-08-24 NOTE — Assessment & Plan Note (Signed)
bp in fair control at this time  BP Readings from Last 1 Encounters:  08/24/16 138/60   No changes needed Disc lifstyle change with low sodium diet and exercise  Improved on 2nd check

## 2016-08-24 NOTE — Assessment & Plan Note (Signed)
Pt states that knees go out/no pain  Suspect generalized weakness ? Age related balance loss Uses a walker  Ref to PT for gait training/strength/vestibular help  Continue to monitor

## 2016-08-24 NOTE — Assessment & Plan Note (Signed)
Fairly stable on prozac and wellbutrin

## 2016-08-25 ENCOUNTER — Ambulatory Visit: Payer: PPO | Attending: Family Medicine

## 2016-08-25 VITALS — BP 178/56 | HR 74

## 2016-08-25 DIAGNOSIS — R2681 Unsteadiness on feet: Secondary | ICD-10-CM

## 2016-08-25 NOTE — Therapy (Signed)
[facility] [address] [place], [place], [address] Phone: [phone]   Fax:  [phone]  Physical Therapy Evaluation  Patient Details  Name: Amanda Benson MRN: [id number] Date of Birth: 03/19/1936 Referring Provider: Dr. Milinda Antisower  Encounter Date: 08/25/2016      PT End of Session - 08/25/16 1504    Visit Number 1   Number of Visits 17   Date for PT Re-Evaluation 10/20/16   Authorization Type g codes 1/10   PT Start Time 1105   PT Stop Time 1203   PT Time Calculation (min) 58 min   Activity Tolerance Patient tolerated treatment well   Behavior During Therapy [facility] for tasks assessed/performed      Past Medical History:  Diagnosis Date  . Depression   . Diabetes mellitus    type II  . Hyperlipidemia   . Hypertension   . Obesity   . Osteoporosis     Past Surgical History:  Procedure Laterality Date  . ANKLE SURGERY     left medial malleblus fracture  . CHOLECYSTECTOMY      Vitals:   08/25/16 1112  BP: (!) 178/56  Pulse: 74  SpO2: 100%         Subjective Assessment - 08/25/16 1113    Subjective LE weakness   Patient is accompained by: Family member  Daughter   Pertinent History Pt reports history of chronic lower extremity weakness and repeated falls for multiple years. She has fallen approximately 5 times since January 2018. Pt has a rollator, two wheeled walker and three wheeled walkers at home. She generally uses the three wheeled walker. She generally falls when not using her walker but one of the recent falls occured while she was using her three wheeled walker. Pt reports LE buckling when she falls and not LOB. However with questioning she does report intermittently catching her foot on the wheel of her walker. Daughter reports she has caught her mother and prevented her from falling 2 more times recently. No injuries with the falls. Denies N/T in bilateral LEs. Mild low back pain but she does not  correlate with her LE weakness. Denies bowel/bladder changes or saddle paresthesia. ROS negative for red flags.    Patient Stated Goals Improve LE strength   Currently in Pain? No/denies            [facility] - 08/25/16 1117      Assessment   Medical Diagnosis Frequent falls, generalized weakness   Referring Provider Dr. Milinda Antisower   Onset Date/Surgical Date --  Unclear, has progressed over multiple years   Hand Dominance Left   Next MD Visit She sees her PCP every 3 monmths   Prior Therapy Physical therapy for her back many years ago     Precautions   Precautions Fall     Restrictions   Weight Bearing Restrictions No     Balance Screen   Has the patient fallen in the past 6 months Yes   How many times? 5   Has the patient had a decrease in activity level because of a fear of falling?  Yes   Is the patient reluctant to leave their home because of a fear of falling?  Yes     Home Environment   Living Environment Private residence   Living Arrangements Spouse/significant other;Other (Comment)  Daughter there during day   Available Help at Discharge Family   Type of Home House   Home  Access Ramped entrance   Home Layout One level   Home Equipment Walker - 4 wheels;Walker - 2 wheels  Three-wheeled walker     Prior Function   Level of Independence Independent with basic ADLs;Needs assistance with homemaking   Vocation Retired   Garment/textile technologist games, coloring, sitting in swing. No regular exercise program     Cognition   Overall Cognitive Status Within Functional Limits for tasks assessed   Behaviors --  Pt appears very anxious     Sensation   Additional Comments WNL L2-S2, denies neuropathy     Posture/Postural Control   Posture Comments Rounded shoulders and forward head     ROM / Strength   AROM / PROM / Strength Strength     Strength   Strength Assessment Site Hip;Knee;Ankle   Right/Left Hip Right;Left   Right Hip Flexion 4-/5   Left Hip  Flexion 4-/5   Right/Left Knee Right;Left   Right Knee Flexion 4/5   Right Knee Extension 5/5   Left Knee Flexion 4/5   Left Knee Extension 5/5   Right/Left Ankle Right;Left   Right Ankle Dorsiflexion 4-/5   Left Ankle Dorsiflexion 4/5     Palpation   Palpation comment Deferred      Ambulation/Gait   Gait Comments Pt walks with short, shuffling steps. Decreased toe to floor clearance noted. No real foot drop but very weak dorsiflexion. Gait speed is very slow     Standardized Balance Assessment   Standardized Balance Assessment Berg Balance Test;Five Times Sit to Stand;10 meter walk test;Timed Up and Go Test   Five times sit to stand comments  Unable to perform without UE support   10 Meter Walk 29.2s=0.34 m/s     Berg Balance Test   Sit to Stand Able to stand using hands after several tries   Standing Unsupported Able to stand 2 minutes with supervision   Sitting with Back Unsupported but Feet Supported on Floor or Stool Able to sit safely and securely 2 minutes   Stand to Sit Uses backs of legs against chair to control descent   Transfers Able to transfer with verbal cueing and /or supervision   Standing Unsupported with Eyes Closed Able to stand 10 seconds with supervision   Standing Ubsupported with Feet Together Needs help to attain position but able to stand for 30 seconds with feet together   From Standing, Reach Forward with Outstretched Arm Can reach forward >5 cm safely (2")   From Standing Position, Pick up Object from Floor Able to pick up shoe, needs supervision   From Standing Position, Turn to Look Behind Over each Shoulder Looks behind from both Able and weight shifts well   Turn 360 Degrees Needs close supervision or verbal cueing   Standing Unsupported, Alternately Place Feet on Step/Stool Able to complete >2 steps/needs minimal assist   Standing Unsupported, One Foot in Front Able to take small step independently and hold 30 seconds   Standing on One Leg Tries  to lift leg/unable to hold 3 seconds but remains standing independently   Total Score 31     Timed Up and Go Test   TUG Normal TUG   Normal TUG (seconds) 33         TREATMENT  Neuromuscular Re-education Extensive time with education for patient regarding HEP; Reviewed HEP with patient and daughter which includes sit to stand from elevated surface as well as semitandem balance. Demonstration provided for patient about how to perform; Handout  provided with exercises;                       PT Education - September 10, 2016 1504    Education provided Yes   Education Details HEP and plan of care   Person(s) Educated Patient;Child(ren)   Methods Explanation;Demonstration;Verbal cues;Handout   Comprehension Verbalized understanding             PT Long Term Goals - 09/10/2016 1514      PT LONG TERM GOAL #1   Title Pt will be independent with HEP in order to improve strength and balance in order to decrease fall risk and improve function at home and work.    Time 8   Period Weeks   Status New     PT LONG TERM GOAL #2   Title  Pt will decrease TUG to below 15 seconds in order to demonstrate decreased fall risk    Baseline Sep 10, 2016: 33s   Time 8   Period Weeks   Status New     PT LONG TERM GOAL #3   Title Pt will improve BERG by at least 3 points in order to demonstrate clinically significant improvement in balance.     Baseline Sep 10, 2016: 31/56   Time 8   Period Weeks   Status New     PT LONG TERM GOAL #4   Title Pt will increase by at least 0.13 m/s in order to demonstrate clinically significant improvement in community ambulation.   Baseline 2016/09/10: 0.34 m/s   Time 8   Period Weeks   Status New               Plan - 10-Sep-2016 1505    Clinical Impression Statement Pt is a pleasant 81 yo female referred for LE weakness and repeated falls. PT evaluation reveals significant LE weakness and balance deficits. Pt is unable to come to standing from a  chair without significant UE support. TUG is 33 seconds which is significantly above age/gender norms and place her in a high fall risk category. Her gait speed is very slow at 0.34 m/s (0.4 to 0.8 m/s is required for safe limited community ambulation). Her balance is significantly impaired with BERG of 31/56. Pt is a very high risk for repeated falls and will benefit from skilled PT services to address deficits in balance and strength in order to decrease fall risk and improve function at home.    Rehab Potential Good   Clinical Impairments Affecting Rehab Potential Positive: motivation, family support; Negative: chronicity, sedentary lifestyle   PT Frequency 2x / week   PT Duration 8 weeks   PT Treatment/Interventions ADLs/Self Care Home Management;Aquatic Therapy;DME Instruction;Gait training;Stair training;Functional mobility training;Therapeutic activities;Therapeutic exercise;Balance training;Neuromuscular re-education;Patient/family education;Manual techniques;Energy conservation   PT Next Visit Plan Have pt complete ABC, progress balance and strength   PT Home Exercise Plan sit to stand from elevated surface 2 x 10 qd, semitandem balance alternating forward LE 30s x 3   Consulted and Agree with Plan of Care Patient      Patient will benefit from skilled therapeutic intervention in order to improve the following deficits and impairments:  Abnormal gait, Decreased balance, Decreased mobility, Decreased strength  Visit Diagnosis: Unsteadiness on feet - Plan: PT plan of care cert/re-cert      G-Codes - 09/10/16 1516    Functional Assessment Tool Used (Outpatient Only) clinical judgement, TUG, 90m gait speed, BERG   Functional Limitation Mobility: Walking and moving around  Mobility: Walking and Moving Around Current Status [phone]) At least 60 percent but less than 80 percent impaired, limited or restricted   Mobility: Walking and Moving Around Goal Status [phone]) At least 40 percent but  less than 60 percent impaired, limited or restricted       Problem List Patient Active Problem List   Diagnosis Date Noted  . Depression with anxiety 08/24/2016  . Falls frequently 08/24/2016  . Generalized weakness 08/24/2016  . Urine frequency 11/19/2015  . Routine general medical examination at a health care facility 05/16/2015  . Dysuria 05/10/2014  . Breast calcifications on mammogram 03/01/2014  . Encounter for Medicare annual wellness exam 05/30/2013  . Breast calcification seen on mammogram 12/14/2012  . Breast calcification, left 11/28/2012  . History of falling 11/27/2012  . Poor balance 11/27/2012  . Colon cancer screening 05/10/2012  . Syncope 08/22/2011  . Osteoporosis 06/21/2007  . Diabetes type 2, controlled ([facility]) 09/20/2006  . Hyperlipidemia associated with type 2 diabetes mellitus ([facility]) 09/20/2006  . Essential hypertension 09/20/2006  . Osteoarthritis 09/20/2006   Lynnea Maizes PT, DPT   Hassie Mandt 08/25/2016, 3:22 PM  [facility] MAIN [facility] SERVICES [address] [place], [place], [address] Phone: [phone]   Fax:  [phone]  Name: Amanda Benson MRN: [id number] Date of Birth: 1935/12/28

## 2016-09-01 ENCOUNTER — Encounter: Payer: Self-pay | Admitting: Physical Therapy

## 2016-09-01 ENCOUNTER — Ambulatory Visit: Payer: PPO | Admitting: Physical Therapy

## 2016-09-01 DIAGNOSIS — R2681 Unsteadiness on feet: Secondary | ICD-10-CM | POA: Diagnosis not present

## 2016-09-01 NOTE — Patient Instructions (Addendum)
Abduction: Side Leg Lift (Eccentric) - Side-Lying    Lie on side. Lift top leg slightly higher than shoulder level. Keep top leg straight with body, toes pointing forward. Slowly lower for 3-5 seconds. ___ reps per set, ___ sets per day, ___ days per week. Add ___ lbs when you achieve ___ repetitions.  http://ecce.exer.us/63   Copyright  VHI. All rights reserved.  Bridge    Lie back, legs bent. Inhale, pressing hips up. Keeping ribs in, lengthen lower back. Exhale, rolling down along spine from top. Repeat ____ times. Do ____ sessions per day.  http://pm.exer.us/55   Copyright  VHI. All rights reserved.  Toe / Heel Raise    Gently rock back on heels and raise toes. Then rock forward on toes and raise heels. Repeat sequence ____ times per session. Do ____ sessions per week.  Copyright  VHI. All rights reserved.  Abduction: Clam (Eccentric) - Side-Lying    Lie on side with knees bent. Lift top knee, keeping feet together. Keep trunk steady. Slowly lower for 3-5 seconds. ___ reps per set, ___ sets per day, ___ days per week. Add ___ lbs when you achieve ___ repetitions.  http://ecce.exer.us/65   Copyright  VHI. All rights reserved.  Abduction: Clam (Eccentric) - Side-Lying    Lie on side with knees bent. Lift top knee, keeping feet together. Keep trunk steady. Slowly lower for 3-5 seconds. ___ reps per set, ___ sets per day, ___ days per week. Add ___ lbs when you achieve ___ repetitions.  http://ecce.exer.us/65   Copyright  VHI. All rights reserved.

## 2016-09-01 NOTE — Therapy (Signed)
[facility] [address] [place], [place], [address] Phone: [phone]   Fax:  [phone]  Physical Therapy Treatment  Patient Details  Name: Amanda KaufmannShirley A Glinski MRN: [id number] Date of Birth: 09/22/1935 Referring Provider: Dr. Milinda Antisower  Encounter Date: 09/01/2016      PT End of Session - 09/01/16 0935    Visit Number 2   Number of Visits 17   Date for PT Re-Evaluation 10/20/16   Authorization Type g codes 2/10   PT Start Time 0925   PT Stop Time 1003   PT Time Calculation (min) 38 min   Activity Tolerance Patient tolerated treatment well   Behavior [facility] Therapy [facility] for tasks assessed/performed      Past Medical History:  Diagnosis Date  . Depression   . Diabetes mellitus    type II  . Hyperlipidemia   . Hypertension   . Obesity   . Osteoporosis     Past Surgical History:  Procedure Laterality Date  . ANKLE SURGERY     left medial malleblus fracture  . CHOLECYSTECTOMY      There were no vitals filed for this visit.      Subjective Assessment - 09/01/16 0934    Subjective Patient reports that she is frightened of being in a bottom level area without windows. She reports that she fell last night after feeling a dizzy spell.  No pain.    Patient is accompained by: Family member  Daughter   Pertinent History Pt reports history of chronic lower extremity weakness and repeated falls for multiple years. She has fallen approximately 5 times since January 2018. Pt has a rollator, two wheeled walker and three wheeled walkers at home. She generally uses the three wheeled walker. She generally falls when not using her walker but one of the recent falls occured while she was using her three wheeled walker. Pt reports LE buckling when she falls and not LOB. However with questioning she does report intermittently catching her foot on the wheel of her walker. Daughter reports she has caught her mother and prevented her from  falling 2 more times recently. No injuries with the falls. Denies N/T in bilateral LEs. Mild low back pain but she does not correlate with her LE weakness. Denies bowel/bladder changes or saddle paresthesia. ROS negative for red flags.    Patient Stated Goals Improve LE strength   Currently in Pain? No/denies   Pain Score 0-No pain   Multiple Pain Sites No      Treatment: hooklying abd/Er with YTB x 30 x 2, Cues to control band during eccentric phase.  Hooklying marching and cues for control and correct technique sidelying abd x 15 x 2 BLE leg press 2x10 45# and 1x10 60# with cues for eccentric control  Sideways walking with RTB around knees, 30 ft x3 lengths in each direction  Gait training with rollator 300 feet with cues for posture and lifting RLE to prevent shuffling                           PT Education - 09/01/16 0935    Education provided Yes   Education Details safety with transfers   Person(s) Educated Patient   Methods Explanation   Comprehension Verbalized understanding             PT Long Term Goals - 08/25/16 1514      PT LONG TERM GOAL #1  Title Pt will be independent with HEP in order to improve strength and balance in order to decrease fall risk and improve function at home and work.    Time 8   Period Weeks   Status New     PT LONG TERM GOAL #2   Title  Pt will decrease TUG to below 15 seconds in order to demonstrate decreased fall risk    Baseline 08/25/16: 33s   Time 8   Period Weeks   Status New     PT LONG TERM GOAL #3   Title Pt will improve BERG by at least 3 points in order to demonstrate clinically significant improvement in balance.     Baseline 08/25/16: 31/56   Time 8   Period Weeks   Status New     PT LONG TERM GOAL #4   Title Pt will increase by at least 0.13 m/s in order to demonstrate clinically significant improvement in community ambulation.   Baseline 08/25/16: 0.34 m/s   Time 8   Period Weeks    Status New               Plan - 09/01/16 4098    Clinical Impression Statement Patient presents with decreased gait speed, decreased balance, and decreased BLE strength. Patient's main complaint is BLE weakness and inability to participate in desired activities. Further PT examination revealed inability to tolerate single leg or tandem stance, as well as outcome measures that show the patient is at a risk for falls. Patient will benefit from skilled PT in order to increase gait speed, increase BLE strength, and improve dynamic standing balance to decrease risk for falls and enable patient to participate in desired activities   Rehab Potential Good   Clinical Impairments Affecting Rehab Potential Positive: motivation, family support; Negative: chronicity, sedentary lifestyle   PT Frequency 2x / week   PT Duration 8 weeks   PT Treatment/Interventions ADLs/Self Care Home Management;Aquatic Therapy;DME Instruction;Gait training;Stair training;Functional mobility training;Therapeutic activities;Therapeutic exercise;Balance training;Neuromuscular re-education;Patient/family education;Manual techniques;Energy conservation   PT Next Visit Plan Have pt complete ABC, progress balance and strength   PT Home Exercise Plan sit to stand from elevated surface 2 x 10 qd, semitandem balance alternating forward LE 30s x 3   Consulted and Agree with Plan of Care Patient      Patient will benefit from skilled therapeutic intervention in order to improve the following deficits and impairments:  Abnormal gait, Decreased balance, Decreased mobility, Decreased strength  Visit Diagnosis: Unsteadiness on feet     Problem List Patient Active Problem List   Diagnosis Date Noted  . Depression with anxiety 08/24/2016  . Falls frequently 08/24/2016  . Generalized weakness 08/24/2016  . Urine frequency 11/19/2015  . Routine general medical examination at a health care facility 05/16/2015  . Dysuria  05/10/2014  . Breast calcifications on mammogram 03/01/2014  . Encounter for Medicare annual wellness exam 05/30/2013  . Breast calcification seen on mammogram 12/14/2012  . Breast calcification, left 11/28/2012  . History of falling 11/27/2012  . Poor balance 11/27/2012  . Colon cancer screening 05/10/2012  . Syncope 08/22/2011  . Osteoporosis 06/21/2007  . Diabetes type 2, controlled (HCC) 09/20/2006  . Hyperlipidemia associated with type 2 diabetes mellitus (HCC) 09/20/2006  . Essential hypertension 09/20/2006  . Osteoarthritis 09/20/2006  Ezekiel Ina, PT, DPT  [place], Barkley Bruns S 09/01/2016, 10:07 AM  [facility] MAIN [facility] SERVICES [address] [place], [place], [address] Phone: [phone]  Fax:  [phone]  Name: JAYLIAH BENETT MRN: [id number] Date of Birth: [birth year]

## 2016-09-06 ENCOUNTER — Encounter: Payer: Self-pay | Admitting: Physical Therapy

## 2016-09-06 ENCOUNTER — Ambulatory Visit: Payer: PPO | Attending: Family Medicine | Admitting: Physical Therapy

## 2016-09-06 DIAGNOSIS — R2681 Unsteadiness on feet: Secondary | ICD-10-CM | POA: Diagnosis not present

## 2016-09-06 NOTE — Therapy (Signed)
[place] [facility] [address] [place], [place], [address] Phone: [phone]   Fax:  [phone]  Physical Therapy Treatment  Patient Details  Name: Amanda KaufmannShirley A Poinsett MRN: [id number] Date of Birth: 06/03/1935 Referring Provider: Dr. Milinda Antisower  Encounter Date: 09/06/2016      PT End of Session - 09/06/16 0922    Visit Number 3   Number of Visits 17   Date for PT Re-Evaluation 10/20/16   Authorization Type g codes 3/10   PT Start Time 0915   PT Stop Time 0958   PT Time Calculation (min) 43 min   Activity Tolerance Patient tolerated treatment well   Behavior During Therapy [facility] for tasks assessed/performed      Past Medical History:  Diagnosis Date  . Depression   . Diabetes mellitus    type II  . Hyperlipidemia   . Hypertension   . Obesity   . Osteoporosis     Past Surgical History:  Procedure Laterality Date  . ANKLE SURGERY     left medial malleblus fracture  . CHOLECYSTECTOMY      There were no vitals filed for this visit.      Subjective Assessment - 09/06/16 0921    Subjective Patient reports that she is frightened of being in a bottom level area without windows.  No pain.    Patient is accompained by: Family member  Daughter   Pertinent History Pt reports history of chronic lower extremity weakness and repeated falls for multiple years. She has fallen approximately 5 times since January 2018. Pt has a rollator, two wheeled walker and three wheeled walkers at home. She generally uses the three wheeled walker. She generally falls when not using her walker but one of the recent falls occured while she was using her three wheeled walker. Pt reports LE buckling when she falls and not LOB. However with questioning she does report intermittently catching her foot on the wheel of her walker. Daughter reports she has caught her mother and prevented her from falling 2 more times recently. No injuries with the falls. Denies N/T  in bilateral LEs. Mild low back pain but she does not correlate with her LE weakness. Denies bowel/bladder changes or saddle paresthesia. ROS negative for red flags.    Patient Stated Goals Improve LE strength   Currently in Pain? No/denies   Pain Score 0-No pain   Multiple Pain Sites No      Therex: Leg press 60 lbs x 20 x 2  Supine march 2x10 with cues for increased hip flexion 2x20 Supine bridge with cues to stabilize pelvis 2x20 Supine SLR 4x5 BLE sidelying clamshell 2 x 15 BLE, cue to perform slowly sidelying hip abduction with assist 2 x 15 BLE Sit to stand from elevated mat table 3x5 with cues for hip abduction    Neuromuscular Re-Ed:  Rhomberg stance on airex with head turns x10 to L and x10 to R  Alternating toe taps up to 8" step from airex with cues for soft toe tapping for greater control. Greater instability during L SLS. Intermittent UE support                        PT Education - 09/06/16 0921    Education provided Yes   Education Details safety with walking using device   Person(s) Educated Patient   Methods Explanation   Comprehension Verbalized understanding  PT Long Term Goals - 08/25/16 1514      PT LONG TERM GOAL #1   Title Pt will be independent with HEP in order to improve strength and balance in order to decrease fall risk and improve function at home and work.    Time 8   Period Weeks   Status New     PT LONG TERM GOAL #2   Title  Pt will decrease TUG to below 15 seconds in order to demonstrate decreased fall risk    Baseline 08/25/16: 33s   Time 8   Period Weeks   Status New     PT LONG TERM GOAL #3   Title Pt will improve BERG by at least 3 points in order to demonstrate clinically significant improvement in balance.     Baseline 08/25/16: 31/56   Time 8   Period Weeks   Status New     PT LONG TERM GOAL #4   Title Pt will increase by at least 0.13 m/s in order to demonstrate clinically  significant improvement in community ambulation.   Baseline 08/25/16: 0.34 m/s   Time 8   Period Weeks   Status New               Plan - 09/06/16 9147    Clinical Impression Statement Pt presents with unsteadiness on uneven surfaces and fatigues with therapeutic exercises. Patient needs assist with single leg balance activities and needs min assist. Patient tolerated all interventions well this date and will benefit from continued skilled PT interventions to improve strength and balance and decrease risk of falling   Rehab Potential Good   Clinical Impairments Affecting Rehab Potential Positive: motivation, family support; Negative: chronicity, sedentary lifestyle   PT Frequency 2x / week   PT Duration 8 weeks   PT Treatment/Interventions ADLs/Self Care Home Management;Aquatic Therapy;DME Instruction;Gait training;Stair training;Functional mobility training;Therapeutic activities;Therapeutic exercise;Balance training;Neuromuscular re-education;Patient/family education;Manual techniques;Energy conservation   PT Next Visit Plan Have pt complete ABC, progress balance and strength   PT Home Exercise Plan sit to stand from elevated surface 2 x 10 qd, semitandem balance alternating forward LE 30s x 3   Consulted and Agree with Plan of Care Patient      Patient will benefit from skilled therapeutic intervention in order to improve the following deficits and impairments:  Abnormal gait, Decreased balance, Decreased mobility, Decreased strength  Visit Diagnosis: Unsteadiness on feet     Problem List Patient Active Problem List   Diagnosis Date Noted  . Depression with anxiety 08/24/2016  . Falls frequently 08/24/2016  . Generalized weakness 08/24/2016  . Urine frequency 11/19/2015  . Routine general medical examination at a health care facility 05/16/2015  . Dysuria 05/10/2014  . Breast calcifications on mammogram 03/01/2014  . Encounter for Medicare annual wellness exam  05/30/2013  . Breast calcification seen on mammogram 12/14/2012  . Breast calcification, left 11/28/2012  . History of falling 11/27/2012  . Poor balance 11/27/2012  . Colon cancer screening 05/10/2012  . Syncope 08/22/2011  . Osteoporosis 06/21/2007  . Diabetes type 2, controlled (HCC) 09/20/2006  . Hyperlipidemia associated with type 2 diabetes mellitus (HCC) 09/20/2006  . Essential hypertension 09/20/2006  . Osteoarthritis 09/20/2006   Ezekiel Ina, PT, DPT [place], Barkley Bruns S 09/06/2016, 9:24 AM  [place] [facility] MAIN [facility] SERVICES [address] [place], [place], [address] Phone: [phone]   Fax:  [phone]  Name: LELAH RENNAKER MRN: [id number] Date of Birth: March 27, 1936

## 2016-09-08 ENCOUNTER — Ambulatory Visit: Payer: PPO

## 2016-09-09 ENCOUNTER — Ambulatory Visit: Payer: PPO | Admitting: Physical Therapy

## 2016-09-13 ENCOUNTER — Encounter: Payer: Self-pay | Admitting: Physical Therapy

## 2016-09-13 ENCOUNTER — Ambulatory Visit: Payer: PPO | Admitting: Physical Therapy

## 2016-09-13 DIAGNOSIS — R2681 Unsteadiness on feet: Secondary | ICD-10-CM | POA: Diagnosis not present

## 2016-09-13 NOTE — Therapy (Signed)
[place] [facility] MAIN [facility] SERVICES [address] [place], [place], [address] Phone: [phone]   Fax:  [phone]  Physical Therapy Treatment  Patient Details  Name: Amanda Benson MRN: [id number] Date of Birth: [birth year] Referring Provider: Dr. Milinda Antis  Encounter Date: 09/13/2016      PT End of Session - 09/13/16 0935    Visit Number 4   Number of Visits 17   Date for PT Re-Evaluation 11-18-16   Authorization Type g codes 12-Aug-2022   PT Start Time 0915   PT Stop Time 1000   PT Time Calculation (min) 45 min   Activity Tolerance Patient tolerated treatment well   Behavior During Therapy [facility] for tasks assessed/performed      Past Medical History:  Diagnosis Date  . Depression   . Diabetes mellitus    type II  . Hyperlipidemia   . Hypertension   . Obesity   . Osteoporosis     Past Surgical History:  Procedure Laterality Date  . ANKLE SURGERY     left medial malleblus fracture  . CHOLECYSTECTOMY      There were no vitals filed for this visit.      Subjective Assessment - 09/13/16 0933    Subjective Patient reports that she is having mild hip pain 2/10   Patient is accompained by: Family member  Daughter   Pertinent History Pt reports history of chronic lower extremity weakness and repeated falls for multiple years. She has fallen approximately 5 times since January 2018. Pt has a rollator, two wheeled walker and three wheeled walkers at home. She generally uses the three wheeled walker. She generally falls when not using her walker but one of the recent falls occured while she was using her three wheeled walker. Pt reports LE buckling when she falls and not LOB. However with questioning she does report intermittently catching her foot on the wheel of her walker. Daughter reports she has caught her mother and prevented her from falling 2 more times recently. No injuries with the falls. Denies N/T in bilateral LEs. Mild low back pain but  she does not correlate with her LE weakness. Denies bowel/bladder changes or saddle paresthesia. ROS negative for red flags.    Patient Stated Goals Improve LE strength   Currently in Pain? Yes   Pain Score 2    Pain Location Hip   Pain Orientation Right   Pain Descriptors / Indicators Cramping   Pain Type Acute pain   Pain Onset Today   Pain Frequency Rarely   Aggravating Factors  walking   Pain Relieving Factors no   Effect of Pain on Daily Activities no effects   Multiple Pain Sites No      Therex: Leg press 60 lbs x 20 x 2  Supine march 2x10 with cues for increased hip flexion 2x20 Supine bridge with cues to stabilize pelvis 2x20 Supine SLR 4x5 BLE sidelying clamshell 2 x 15 BLE, cue to perform slowly sidelying hip abduction with assist 2 x 15 BLE Sit to stand from elevated mat table 3x5 with cues for hip abduction  Neuromuscular Re-Ed:  Side stepping on blue foam with min assist and cues for small steps to control balance better Alternating toe taps up to 8" step from airex with cues for soft toe tapping for greater control. Greater instability during L SLS. Intermittent UE support  PT Education - 09/13/16 0935    Education provided Yes   Education Details safety with steps   Person(s) Educated Patient   Methods Explanation   Comprehension Verbalized understanding             PT Long Term Goals - 08/25/16 1514      PT LONG TERM GOAL #1   Title Pt will be independent with HEP in order to improve strength and balance in order to decrease fall risk and improve function at home and work.    Time 8   Period Weeks   Status New     PT LONG TERM GOAL #2   Title  Pt will decrease TUG to below 15 seconds in order to demonstrate decreased fall risk    Baseline 08/25/16: 33s   Time 8   Period Weeks   Status New     PT LONG TERM GOAL #3   Title Pt will improve BERG by at least 3 points in order to demonstrate  clinically significant improvement in balance.     Baseline 08/25/16: 31/56   Time 8   Period Weeks   Status New     PT LONG TERM GOAL #4   Title Pt will increase 10MWT by at least 0.13 m/s in order to demonstrate clinically significant improvement in community ambulation.   Baseline 08/25/16: 0.34 m/s   Time 8   Period Weeks   Status New               Plan - 09/13/16 0935    Clinical Impression Statement Patient requires consistent cueing to maintain correct position during balance activities and side stepping on blue foam . Patient demonstrates difficulty with dynamic standing balance and increased postural sway while on purple foam. Patient will continue to benefit from skilled therapy in order to improve dynamic standing balance and increase endurance   Rehab Potential Good   Clinical Impairments Affecting Rehab Potential Positive: motivation, family support; Negative: chronicity, sedentary lifestyle   PT Frequency 2x / week   PT Duration 8 weeks   PT Treatment/Interventions ADLs/Self Care Home Management;Aquatic Therapy;DME Instruction;Gait training;Stair training;Functional mobility training;Therapeutic activities;Therapeutic exercise;Balance training;Neuromuscular re-education;Patient/family education;Manual techniques;Energy conservation   PT Next Visit Plan Have pt complete ABC, progress balance and strength   PT Home Exercise Plan sit to stand from elevated surface 2 x 10 qd, semitandem balance alternating forward LE 30s x 3   Consulted and Agree with Plan of Care Patient      Patient will benefit from skilled therapeutic intervention in order to improve the following deficits and impairments:  Abnormal gait, Decreased balance, Decreased mobility, Decreased strength  Visit Diagnosis: Unsteadiness on feet     Problem List Patient Active Problem List   Diagnosis Date Noted  . Depression with anxiety 08/24/2016  . Falls frequently 08/24/2016  . Generalized  weakness 08/24/2016  . Urine frequency 11/19/2015  . Routine general medical examination at a health care facility 05/16/2015  . Dysuria 05/10/2014  . Breast calcifications on mammogram 03/01/2014  . Encounter for Medicare annual wellness exam 05/30/2013  . Breast calcification seen on mammogram 12/14/2012  . Breast calcification, left 11/28/2012  . History of falling 11/27/2012  . Poor balance 11/27/2012  . Colon cancer screening 05/10/2012  . Syncope 08/22/2011  . Osteoporosis 06/21/2007  . Diabetes type 2, controlled (HCC) 09/20/2006  . Hyperlipidemia associated with type 2 diabetes mellitus (HCC) 09/20/2006  . Essential hypertension 09/20/2006  . Osteoarthritis 09/20/2006   Barkley BrunsKristine  Octavia Heir, PT, DPT [place], [place] S 09/13/2016, 1:13 PM  [facility] MAIN [facility] SERVICES [address] [place], [place], [address] Phone: [phone]   Fax:  [phone]  Name: KEMYA SHED MRN: [id number] Date of Birth: 1936/02/13

## 2016-09-15 ENCOUNTER — Ambulatory Visit: Payer: PPO | Admitting: Physical Therapy

## 2016-09-15 ENCOUNTER — Encounter: Payer: Self-pay | Admitting: Physical Therapy

## 2016-09-15 DIAGNOSIS — R2681 Unsteadiness on feet: Secondary | ICD-10-CM | POA: Diagnosis not present

## 2016-09-15 NOTE — Therapy (Signed)
[place] [facility] MAIN [facility] SERVICES [address] [place], [place], [address] Phone: [phone]   Fax:  [phone]  Physical Therapy Treatment  Patient Details  Name: Amanda Benson MRN: [id number] Date of Birth: 09-Apr-1935 Referring Provider: Dr. Milinda Antis  Encounter Date: 09/15/2016      PT End of Session - 09/15/16 0949    Visit Number 5   Number of Visits 17   Date for PT Re-Evaluation 10-21-2016   Authorization Type g codes 5/10   PT Start Time 0915   PT Stop Time 0955   PT Time Calculation (min) 40 min   Equipment Utilized During Treatment Gait belt   Activity Tolerance Patient tolerated treatment well   Behavior During Therapy [facility] for tasks assessed/performed      Past Medical History:  Diagnosis Date  . Depression   . Diabetes mellitus    type II  . Hyperlipidemia   . Hypertension   . Obesity   . Osteoporosis     Past Surgical History:  Procedure Laterality Date  . ANKLE SURGERY     left medial malleblus fracture  . CHOLECYSTECTOMY      There were no vitals filed for this visit.      Subjective Assessment - 09/15/16 0948    Subjective Patient is not having any pain today. She continues to be unsteady.    Currently in Pain? No/denies   Pain Score 0-No pain   Multiple Pain Sites No      NEUROMUSCULAR RE-EDUCATION  Airex NBOS eyes open/closed x 30 seconds each; Airex NBOS eyes open horizontal and vertical head turns x 30 seconds; Airex  cone reaching crossing midline  Toe tapping 6 inch stool without UE assist Tandem gait in // bars x 4 laps  Side stepping on blue foam balance beam x 5 lengths of the parallel bars  4 square fwd/bwd, side to side stepping/ diagonal stepping Standing on foam NBOS sorting balls/ sorting shapes reaching  Matrix 2 plates fwd/bwd x 3  side stepping left and right in parallel bars with blue foam  10 feet x 3 standing on blue foam with cone reaching x 20 across midline step ups from  floor to 6 inch stool x 20 bilateral stepping pattern with weight shifting fwd/bwd x 10.  Patient needs occasional verbal cueing to improve posture and cueing to correctly perform exercises slowly, holding at end of range to increase motor firing of desired muscle to encourage fatigue and mod assist for all above exercises with cues for posture and technique correction.                           PT Education - 09/15/16 0949    Education provided Yes   Education Details safety with weight shifting and steps   Person(s) Educated Patient   Methods Explanation;Demonstration   Comprehension Verbalized understanding;Returned demonstration;Verbal cues required             PT Long Term Goals - 08/25/16 1514      PT LONG TERM GOAL #1   Title Pt will be independent with HEP in order to improve strength and balance in order to decrease fall risk and improve function at home and work.    Time 8   Period Weeks   Status New     PT LONG TERM GOAL #2   Title  Pt will decrease TUG to below 15 seconds in order to demonstrate  decreased fall risk    Baseline 08/25/16: 33s   Time 8   Period Weeks   Status New     PT LONG TERM GOAL #3   Title Pt will improve BERG by at least 3 points in order to demonstrate clinically significant improvement in balance.     Baseline 08/25/16: 31/56   Time 8   Period Weeks   Status New     PT LONG TERM GOAL #4   Title Pt will increase 10MWT by at least 0.13 m/s in order to demonstrate clinically significant improvement in community ambulation.   Baseline 08/25/16: 0.34 m/s   Time 8   Period Weeks   Status New               Plan - 09/15/16 1003    Clinical Impression Statement Pt requires direction and verbal cues for correct performance of exercises. Patient has fatigue with RLE foot clearance during swing phase and needs cues to pick up RLE.   Patient struggles with speed during movement as well as balance with unstable  surfaces. Pt encouraged to continue HEP  Patient will benefit from continued skilled PT to improve mobility and safety.   Rehab Potential Good   Clinical Impairments Affecting Rehab Potential Positive: motivation, family support; Negative: chronicity, sedentary lifestyle   PT Frequency 2x / week   PT Duration 8 weeks   PT Treatment/Interventions ADLs/Self Care Home Management;Aquatic Therapy;DME Instruction;Gait training;Stair training;Functional mobility training;Therapeutic activities;Therapeutic exercise;Balance training;Neuromuscular re-education;Patient/family education;Manual techniques;Energy conservation   PT Next Visit Plan Have pt complete ABC, progress balance and strength   PT Home Exercise Plan sit to stand from elevated surface 2 x 10 qd, semitandem balance alternating forward LE 30s x 3   Consulted and Agree with Plan of Care Patient      Patient will benefit from skilled therapeutic intervention in order to improve the following deficits and impairments:  Abnormal gait, Decreased balance, Decreased mobility, Decreased strength  Visit Diagnosis: Unsteadiness on feet     Problem List Patient Active Problem List   Diagnosis Date Noted  . Depression with anxiety 08/24/2016  . Falls frequently 08/24/2016  . Generalized weakness 08/24/2016  . Urine frequency 11/19/2015  . Routine general medical examination at a health care facility 05/16/2015  . Dysuria 05/10/2014  . Breast calcifications on mammogram 03/01/2014  . Encounter for Medicare annual wellness exam 05/30/2013  . Breast calcification seen on mammogram 12/14/2012  . Breast calcification, left 11/28/2012  . History of falling 11/27/2012  . Poor balance 11/27/2012  . Colon cancer screening 05/10/2012  . Syncope 08/22/2011  . Osteoporosis 06/21/2007  . Diabetes type 2, controlled (HCC) 09/20/2006  . Hyperlipidemia associated with type 2 diabetes mellitus (HCC) 09/20/2006  . Essential hypertension 09/20/2006  .  Osteoarthritis 09/20/2006   Ezekiel InaKristine S Desiray Orchard, PT, DPT [place], Barkley BrunsKristine S 09/15/2016, 10:04 AM  [place] [facility] [address] [place], [place], [address] Phone: [phone]   Fax:  [phone]  Name: Amanda Benson MRN: [id number] Date of Birth: 02/19/1936

## 2016-09-20 ENCOUNTER — Ambulatory Visit: Payer: PPO | Admitting: Physical Therapy

## 2016-09-20 ENCOUNTER — Encounter: Payer: Self-pay | Admitting: Physical Therapy

## 2016-09-20 DIAGNOSIS — R2681 Unsteadiness on feet: Secondary | ICD-10-CM | POA: Diagnosis not present

## 2016-09-20 NOTE — Therapy (Signed)
[facility] MAIN [facility] SERVICES [address] [place], [place], [address] Phone: [phone]   Fax:  [phone]  Physical Therapy Treatment  Patient Details  Name: Amanda Benson MRN: [id number] Date of Birth: 1935/09/28 Referring Provider: Dr. Milinda Antis  Encounter Date: 09/20/2016      PT End of Session - 09/20/16 0923    Visit Number 6   Number of Visits 17   Date for PT Re-Evaluation 2016-11-11   Authorization Type g codes 6/10   PT Start Time 0915   PT Stop Time 1000   PT Time Calculation (min) 45 min   Equipment Utilized During Treatment Gait belt   Activity Tolerance Patient tolerated treatment well   Behavior During Therapy [facility] for tasks assessed/performed      Past Medical History:  Diagnosis Date  . Depression   . Diabetes mellitus    type II  . Hyperlipidemia   . Hypertension   . Obesity   . Osteoporosis     Past Surgical History:  Procedure Laterality Date  . ANKLE SURGERY     left medial malleblus fracture  . CHOLECYSTECTOMY      There were no vitals filed for this visit.      Subjective Assessment - 09/20/16 0920    Subjective Patient is not having any pain today. She continues to be unsteady. She is feeling tired today.    Patient is accompained by: Family member  Daughter   Pertinent History Pt reports history of chronic lower extremity weakness and repeated falls for multiple years. She has fallen approximately 5 times since January 2018. Pt has a rollator, two wheeled walker and three wheeled walkers at home. She generally uses the three wheeled walker. She generally falls when not using her walker but one of the recent falls occured while she was using her three wheeled walker. Pt reports LE buckling when she falls and not LOB. However with questioning she does report intermittently catching her foot on the wheel of her walker. Daughter reports she has caught her mother and prevented her from falling 2 more times  recently. No injuries with the falls. Denies N/T in bilateral LEs. Mild low back pain but she does not correlate with her LE weakness. Denies bowel/bladder changes or saddle paresthesia. ROS negative for red flags.    Patient Stated Goals Improve LE strength   Currently in Pain? No/denies   Pain Score 0-No pain   Pain Onset Today   Multiple Pain Sites No      Therapeutic exercise and neuromuscular training: Purple foam  and balance with head turns left and right feet apart and feet together,; cues for keeping balance and using ankle and hips to maintain center of gravity standing on purple foam feet together  ; cues to maintain balance and posture correction standing hip abd with YTB x 20  ; cues for correct position and technique side stepping left and right in parallel bars 10 feet x 3; rotation cues for upright posture and not rotating hips towards the direction of motion step ups from floor to 6 inch stool x 20 bilateral; cues to try not to use UE and to keep correct head position leg press 75 lbs x 20 x 3, 45 lbs with heel raises x 20 x 3 cues not to snap knees during extension and to perform slowly for max strengthening  marching in parallel bars x 20; cues to raise up knees level with therapists hands Tilt board  fwd/bwd, side to side left and right; cues for posture correction Gait training without AD  300 feet , cues for heel toe gait,; cues for posture corretion Patient needs occasional verbal cueing to improve posture and cueing to correctly perform exercises slowly, holding at end of range to increase motor firing of desired muscle to encourage fatigue.                           PT Education - 09/20/16 [phone]    Education provided Yes   Person(s) Educated Patient   Methods Explanation   Comprehension Verbalized understanding             PT Long Term Goals - 08/25/16 1514      PT LONG TERM GOAL #1   Title Pt will be independent with HEP in order to  improve strength and balance in order to decrease fall risk and improve function at home and work.    Time 8   Period Weeks   Status New     PT LONG TERM GOAL #2   Title  Pt will decrease TUG to below 15 seconds in order to demonstrate decreased fall risk    Baseline 08/25/16: 33s   Time 8   Period Weeks   Status New     PT LONG TERM GOAL #3   Title Pt will improve BERG by at least 3 points in order to demonstrate clinically significant improvement in balance.     Baseline 08/25/16: 31/56   Time 8   Period Weeks   Status New     PT LONG TERM GOAL #4   Title Pt will increase 10MWT by at least 0.13 m/s in order to demonstrate clinically significant improvement in community ambulation.   Baseline 08/25/16: 0.34 m/s   Time 8   Period Weeks   Status New               Plan - 09/20/16 0924    Clinical Impression Statement Patient demonstrates LOB with standing balance exercises indicating decreased balancing strategies. Patient did not require UE support to perform side stepping up and over exercise which  was unable to do previous visit indicating functional carryover between visits. Patient will benefit from further skilled therapy to return to prior level of function.   Rehab Potential Good   Clinical Impairments Affecting Rehab Potential Positive: motivation, family support; Negative: chronicity, sedentary lifestyle   PT Frequency 2x / week   PT Duration 8 weeks   PT Treatment/Interventions ADLs/Self Care Home Management;Aquatic Therapy;DME Instruction;Gait training;Stair training;Functional mobility training;Therapeutic activities;Therapeutic exercise;Balance training;Neuromuscular re-education;Patient/family education;Manual techniques;Energy conservation   PT Next Visit Plan Have pt complete ABC, progress balance and strength   PT Home Exercise Plan sit to stand from elevated surface 2 x 10 qd, semitandem balance alternating forward LE 30s x 3   Consulted and Agree with Plan  of Care Patient      Patient will benefit from skilled therapeutic intervention in order to improve the following deficits and impairments:  Abnormal gait, Decreased balance, Decreased mobility, Decreased strength  Visit Diagnosis: Unsteadiness on feet     Problem List Patient Active Problem List   Diagnosis Date Noted  . Depression with anxiety 08/24/2016  . Falls frequently 08/24/2016  . Generalized weakness 08/24/2016  . Urine frequency 11/19/2015  . Routine general medical examination at a health care facility 05/16/2015  . Dysuria 05/10/2014  . Breast calcifications on mammogram 03/01/2014  .  Encounter for Medicare annual wellness exam 05/30/2013  . Breast calcification seen on mammogram 12/14/2012  . Breast calcification, left 11/28/2012  . History of falling 11/27/2012  . Poor balance 11/27/2012  . Colon cancer screening 05/10/2012  . Syncope 08/22/2011  . Osteoporosis 06/21/2007  . Diabetes type 2, controlled (HCC) 09/20/2006  . Hyperlipidemia associated with type 2 diabetes mellitus (HCC) 09/20/2006  . Essential hypertension 09/20/2006  . Osteoarthritis 09/20/2006    Ezekiel Ina 09/20/2016, 9:38 AM  [place] [facility] MAIN [facility] SERVICES [address] [place], [place], [address] Phone: [phone]   Fax:  [phone]  Name: Amanda Benson MRN: [id number] Date of Birth: 06/25/1935

## 2016-09-22 ENCOUNTER — Encounter: Payer: Self-pay | Admitting: Physical Therapy

## 2016-09-22 ENCOUNTER — Ambulatory Visit: Payer: PPO | Admitting: Physical Therapy

## 2016-09-22 DIAGNOSIS — R2681 Unsteadiness on feet: Secondary | ICD-10-CM | POA: Diagnosis not present

## 2016-09-22 NOTE — Therapy (Signed)
[place] [facility] MAIN [facility] SERVICES [address] [place], [place], [address] Phone: [phone]   Fax:  [phone]  Physical Therapy Treatment  Patient Details  Name: Amanda Benson MRN: [id number] Date of Birth: [birth year] Referring Provider: Dr. Milinda Antis  Encounter Date: 09/22/2016      PT End of Session - 09/22/16 0935    Visit Number 7   Number of Visits 17   Date for PT Re-Evaluation 2016/11/14   Authorization Type g codes 7/10   Equipment Utilized During Treatment Gait belt   Activity Tolerance Patient tolerated treatment well   Behavior During Therapy [facility] for tasks assessed/performed      Past Medical History:  Diagnosis Date  . Depression   . Diabetes mellitus    type II  . Hyperlipidemia   . Hypertension   . Obesity   . Osteoporosis     Past Surgical History:  Procedure Laterality Date  . ANKLE SURGERY     left medial malleblus fracture  . CHOLECYSTECTOMY      There were no vitals filed for this visit.      Subjective Assessment - 09/22/16 0934    Subjective Patient is not having any pain today. She continues to be unsteady. She is feeling tired today.    Patient is accompained by: Family member  Daughter   Pertinent History Pt reports history of chronic lower extremity weakness and repeated falls for multiple years. She has fallen approximately 5 times since January 2018. Pt has a rollator, two wheeled walker and three wheeled walkers at home. She generally uses the three wheeled walker. She generally falls when not using her walker but one of the recent falls occured while she was using her three wheeled walker. Pt reports LE buckling when she falls and not LOB. However with questioning she does report intermittently catching her foot on the wheel of her walker. Daughter reports she has caught her mother and prevented her from falling 2 more times recently. No injuries with the falls. Denies N/T in bilateral LEs. Mild low  back pain but she does not correlate with her LE weakness. Denies bowel/bladder changes or saddle paresthesia. ROS negative for red flags.    Patient Stated Goals Improve LE strength   Currently in Pain? No/denies   Pain Score 0-No pain   Pain Onset Today   Multiple Pain Sites No         TREATMENT Therapeutic exercise; 4 square fwd/bwd/ side stepping left and right/ diagonals Tapping on steps x 20  Ascending/descending steps without railing  Side stepping with RTB x 4 lengths Squats x 10 x 2 Heel raises x 10 x 2. Matrix x 5 with plate #1 fwd/ bwd with min assist and cues for technique Patient needs  verbal cueing to improve posture and cueing to correctly perform exercises slowly, holding at end of range to increase motor firing of desired muscle to encourage fatigue.                          PT Education - 09/22/16 0934    Education provided Yes   Education Details safety with turning   Person(s) Educated Patient   Methods Explanation   Comprehension Verbalized understanding             PT Long Term Goals - 08/25/16 1514      PT LONG TERM GOAL #1   Title Pt will be independent with HEP  in order to improve strength and balance in order to decrease fall risk and improve function at home and work.    Time 8   Period Weeks   Status New     PT LONG TERM GOAL #2   Title  Pt will decrease TUG to below 15 seconds in order to demonstrate decreased fall risk    Baseline 08/25/16: 33s   Time 8   Period Weeks   Status New     PT LONG TERM GOAL #3   Title Pt will improve BERG by at least 3 points in order to demonstrate clinically significant improvement in balance.     Baseline 08/25/16: 31/56   Time 8   Period Weeks   Status New     PT LONG TERM GOAL #4   Title Pt will increase 10MWT by at least 0.13 m/s in order to demonstrate clinically significant improvement in community ambulation.   Baseline 08/25/16: 0.34 m/s   Time 8   Period Weeks    Status New               Plan - 09/22/16 0935    Clinical Impression Statement Patient presents with decreased gait speed, decreased balance, and decreased BLE strength. Patient's main complaint is BLE weakness and inability to participate in desired activities. Further PT examination revealed inability to tolerate single leg or tandem stance, as well as outcome measures that show the patient is at a risk for falls. Patient will benefit from skilled PT in order to increase gait speed, increase BLE strength, and improve dynamic standing balance to decrease risk for falls and enable patient to participate in desired activities   Rehab Potential Good   Clinical Impairments Affecting Rehab Potential Positive: motivation, family support; Negative: chronicity, sedentary lifestyle   PT Frequency 2x / week   PT Duration 8 weeks   PT Treatment/Interventions ADLs/Self Care Home Management;Aquatic Therapy;DME Instruction;Gait training;Stair training;Functional mobility training;Therapeutic activities;Therapeutic exercise;Balance training;Neuromuscular re-education;Patient/family education;Manual techniques;Energy conservation   PT Next Visit Plan Have pt complete ABC, progress balance and strength   PT Home Exercise Plan sit to stand from elevated surface 2 x 10 qd, semitandem balance alternating forward LE 30s x 3   Consulted and Agree with Plan of Care Patient      Patient will benefit from skilled therapeutic intervention in order to improve the following deficits and impairments:  Abnormal gait, Decreased balance, Decreased mobility, Decreased strength  Visit Diagnosis: Unsteadiness on feet     Problem List Patient Active Problem List   Diagnosis Date Noted  . Depression with anxiety 08/24/2016  . Falls frequently 08/24/2016  . Generalized weakness 08/24/2016  . Urine frequency 11/19/2015  . Routine general medical examination at a health care facility 05/16/2015  . Dysuria  05/10/2014  . Breast calcifications on mammogram 03/01/2014  . Encounter for Medicare annual wellness exam 05/30/2013  . Breast calcification seen on mammogram 12/14/2012  . Breast calcification, left 11/28/2012  . History of falling 11/27/2012  . Poor balance 11/27/2012  . Colon cancer screening 05/10/2012  . Syncope 08/22/2011  . Osteoporosis 06/21/2007  . Diabetes type 2, controlled (HCC) 09/20/2006  . Hyperlipidemia associated with type 2 diabetes mellitus (HCC) 09/20/2006  . Essential hypertension 09/20/2006  . Osteoarthritis 09/20/2006   Ezekiel InaKristine S Eldra Word, PT, DPT [place], Barkley BrunsKristine S 09/22/2016, 10:08 AM  [place] [facility] [address] [place], [place], [address] Phone: [phone]   Fax:  [phone]  Name:  Amanda Benson MRN: [id number] Date of Birth: 03-10-1936

## 2016-09-27 ENCOUNTER — Encounter: Payer: Self-pay | Admitting: Physical Therapy

## 2016-09-27 ENCOUNTER — Ambulatory Visit: Payer: PPO | Admitting: Physical Therapy

## 2016-09-27 DIAGNOSIS — R2681 Unsteadiness on feet: Secondary | ICD-10-CM | POA: Diagnosis not present

## 2016-09-27 NOTE — Therapy (Signed)
[facility] MAIN [facility] SERVICES [address] [place], [place], [address] Phone: [phone]   Fax:  [phone]  Physical Therapy Treatment  Patient Details  Name: Amanda Benson MRN: [id number] Date of Birth: [birth year] Referring Provider: Dr. Milinda Antis  Encounter Date: 09/27/2016      PT End of Session - 09/27/16 0922    Visit Number 8   Number of Visits 17   Date for PT Re-Evaluation 11/18/16   Authorization Type g codes 8/10   PT Start Time 0915   PT Stop Time 1000   PT Time Calculation (min) 45 min   Equipment Utilized During Treatment Gait belt   Activity Tolerance Patient tolerated treatment well   Behavior During Therapy [facility] for tasks assessed/performed      Past Medical History:  Diagnosis Date  . Depression   . Diabetes mellitus    type II  . Hyperlipidemia   . Hypertension   . Obesity   . Osteoporosis     Past Surgical History:  Procedure Laterality Date  . ANKLE SURGERY     left medial malleblus fracture  . CHOLECYSTECTOMY      There were no vitals filed for this visit.      Subjective Assessment - 09/27/16 0921    Subjective Patient is having back pain today. She continues to be unsteady. She is feeling tired today.    Patient is accompained by: Family member  Daughter   Pertinent History Pt reports history of chronic lower extremity weakness and repeated falls for multiple years. She has fallen approximately 5 times since January 2018. Pt has a rollator, two wheeled walker and three wheeled walkers at home. She generally uses the three wheeled walker. She generally falls when not using her walker but one of the recent falls occured while she was using her three wheeled walker. Pt reports LE buckling when she falls and not LOB. However with questioning she does report intermittently catching her foot on the wheel of her walker. Daughter reports she has caught her mother and prevented her from falling 2 more times  recently. No injuries with the falls. Denies N/T in bilateral LEs. Mild low back pain but she does not correlate with her LE weakness. Denies bowel/bladder changes or saddle paresthesia. ROS negative for red flags.    Patient Stated Goals Improve LE strength   Currently in Pain? Yes   Pain Score 3    Pain Location Back   Pain Orientation Lower   Pain Descriptors / Indicators Aching   Pain Onset Today   Multiple Pain Sites No      Therapeutic Exercise:  Sit<>stand 2x10 without UE use. Pt reports fatigue at end of second set.  Heel raises with BUE support on // bar. 2x10  Bil hip abd 2x10 in standing  Bl hip extension 2x10 in standing with GTB  Side stepping left and right x 10 Fwd step ups x 20 without UE support x 20      Neuromuscular Re-Ed:  Rhomberg stance on airex with trunk rotation x10 to L and x10 to R  Ant/posterior rockerboard static balancing x1 minute with tendency to lose balance posteriorly with intermittent UE support  Lateral rockerboard static balancing x1 minute with intermittent UE support  Alternating toe taps up to 8" step from airex with cues for soft toe tapping for greater control. Greater instability during L SLS. Intermittent UE support  PT Education - 09/27/16 [phone]    Education provided Yes   Education Details safety with steps and with ambulation    Person(s) Educated Patient   Methods Explanation   Comprehension Verbalized understanding             PT Long Term Goals - 08/25/16 1514      PT LONG TERM GOAL #1   Title Pt will be independent with HEP in order to improve strength and balance in order to decrease fall risk and improve function at home and work.    Time 8   Period Weeks   Status New     PT LONG TERM GOAL #2   Title  Pt will decrease TUG to below 15 seconds in order to demonstrate decreased fall risk    Baseline 08/25/16: 33s   Time 8   Period Weeks   Status New     PT LONG  TERM GOAL #3   Title Pt will improve BERG by at least 3 points in order to demonstrate clinically significant improvement in balance.     Baseline 08/25/16: 31/56   Time 8   Period Weeks   Status New     PT LONG TERM GOAL #4   Title Pt will increase 10MWT by at least 0.13 m/s in order to demonstrate clinically significant improvement in community ambulation.   Baseline 08/25/16: 0.34 m/s   Time 8   Period Weeks   Status New               Plan - 09/27/16 [id number]    Clinical Impression Statement Pt presents with unsteadiness on uneven surfaces and fatigues with therapeutic exercises. Patient needs assist with single leg balance activities and needs min assist. Patient tolerated all interventions well this date and will benefit from continued skilled PT interventions to improve strength and balance and decrease risk of falling   Rehab Potential Good   Clinical Impairments Affecting Rehab Potential Positive: motivation, family support; Negative: chronicity, sedentary lifestyle   PT Frequency 2x / week   PT Duration 8 weeks   PT Treatment/Interventions ADLs/Self Care Home Management;Aquatic Therapy;DME Instruction;Gait training;Stair training;Functional mobility training;Therapeutic activities;Therapeutic exercise;Balance training;Neuromuscular re-education;Patient/family education;Manual techniques;Energy conservation   PT Next Visit Plan Have pt complete ABC, progress balance and strength   PT Home Exercise Plan sit to stand from elevated surface 2 x 10 qd, semitandem balance alternating forward LE 30s x 3   Consulted and Agree with Plan of Care Patient      Patient will benefit from skilled therapeutic intervention in order to improve the following deficits and impairments:  Abnormal gait, Decreased balance, Decreased mobility, Decreased strength  Visit Diagnosis: Unsteadiness on feet     Problem List Patient Active Problem List   Diagnosis Date Noted  . Depression with  anxiety 08/24/2016  . Falls frequently 08/24/2016  . Generalized weakness 08/24/2016  . Urine frequency 11/19/2015  . Routine general medical examination at a health care facility 05/16/2015  . Dysuria 05/10/2014  . Breast calcifications on mammogram 03/01/2014  . Encounter for Medicare annual wellness exam 05/30/2013  . Breast calcification seen on mammogram 12/14/2012  . Breast calcification, left 11/28/2012  . History of falling 11/27/2012  . Poor balance 11/27/2012  . Colon cancer screening 05/10/2012  . Syncope 08/22/2011  . Osteoporosis 06/21/2007  . Diabetes type 2, controlled (HCC) 09/20/2006  . Hyperlipidemia associated with type 2 diabetes mellitus (HCC) 09/20/2006  . Essential hypertension 09/20/2006  . Osteoarthritis 09/20/2006  Ezekiel Ina, PT, DPT [place], Barkley Bruns S 09/27/2016, 9:23 AM  [facility] MAIN [facility] SERVICES [address] [place], [place], [address] Phone: [phone]   Fax:  [phone]  Name: KIP KAUTZMAN MRN: [id number] Date of Birth: 1936-03-23

## 2016-09-29 ENCOUNTER — Encounter: Payer: Self-pay | Admitting: Physical Therapy

## 2016-09-29 ENCOUNTER — Ambulatory Visit: Payer: PPO | Admitting: Physical Therapy

## 2016-09-29 DIAGNOSIS — R2681 Unsteadiness on feet: Secondary | ICD-10-CM | POA: Diagnosis not present

## 2016-09-29 NOTE — Therapy (Signed)
[facility] MAIN [facility] SERVICES [address] [place], [place], [address] Phone: [phone]   Fax:  [phone]  Physical Therapy Treatment  Patient Details  Name: Amanda Benson MRN: [id number] Date of Birth: Mar 26, 1936 Referring Provider: Dr. Milinda Antis  Encounter Date: 09/29/2016      PT End of Session - 09/29/16 0852    Visit Number 9   Number of Visits 17   Date for PT Re-Evaluation 11/07/16   Authorization Type g codes 01-Jan-2023   PT Start Time 0833   PT Stop Time 0915   PT Time Calculation (min) 42 min   Equipment Utilized During Treatment Gait belt   Activity Tolerance Patient tolerated treatment well   Behavior During Therapy [facility] for tasks assessed/performed      Past Medical History:  Diagnosis Date  . Depression   . Diabetes mellitus    type II  . Hyperlipidemia   . Hypertension   . Obesity   . Osteoporosis     Past Surgical History:  Procedure Laterality Date  . ANKLE SURGERY     left medial malleblus fracture  . CHOLECYSTECTOMY      There were no vitals filed for this visit.      Subjective Assessment - 09/29/16 0848    Subjective Patient is having right ankle pain today , she twisted it yesterday.    Patient is accompained by: Family member  Daughter   Pertinent History Pt reports history of chronic lower extremity weakness and repeated falls for multiple years. She has fallen approximately 5 times since January 2018. Pt has a rollator, two wheeled walker and three wheeled walkers at home. She generally uses the three wheeled walker. She generally falls when not using her walker but one of the recent falls occured while she was using her three wheeled walker. Pt reports LE buckling when she falls and not LOB. However with questioning she does report intermittently catching her foot on the wheel of her walker. Daughter reports she has caught her mother and prevented her from falling 2 more times recently. No injuries with  the falls. Denies N/T in bilateral LEs. Mild low back pain but she does not correlate with her LE weakness. Denies bowel/bladder changes or saddle paresthesia. ROS negative for red flags.    Patient Stated Goals Improve LE strength   Currently in Pain? Yes   Pain Score 6    Pain Location Ankle   Pain Orientation Right   Pain Descriptors / Indicators Aching   Pain Type Acute pain   Pain Onset Today   Pain Frequency Constant   Aggravating Factors  walking   Pain Relieving Factors no   Effect of Pain on Daily Activities unable to stand without pain   Multiple Pain Sites No      Therex: Leg press 60 lbs x 20 x 2 , cues for correct technique Supine march 2x10 with cues for increased hip flexion 2x20 Supine bridge with cues to stabilize pelvis 2x20 Supine SLR 4x5 BLE sidelying clamshell 2 x 15 BLE, cue to perform slowly sidelying hip abduction with assist 2 x 15 BLE Supine hip abd BLE x 20  Patient needs cues for correct position, correct technique and posture                           PT Education - 09/29/16 0852    Education provided Yes   Education Details use of ace  wrap and ice on right ankle   Person(Benson) Educated Patient   Methods Explanation   Comprehension Verbalized understanding             PT Long Term Goals - 08/25/16 1514      PT LONG TERM GOAL #1   Title Pt will be independent with HEP in order to improve strength and balance in order to decrease fall risk and improve function at home and work.    Time 8   Period Weeks   Status New     PT LONG TERM GOAL #2   Title  Pt will decrease TUG to below 15 seconds in order to demonstrate decreased fall risk    Baseline 08/25/16: 33s   Time 8   Period Weeks   Status New     PT LONG TERM GOAL #3   Title Pt will improve BERG by at least 3 points in order to demonstrate clinically significant improvement in balance.     Baseline 08/25/16: 31/56   Time 8   Period Weeks   Status New     PT  LONG TERM GOAL #4   Title Pt will increase 10MWT by at least 0.13 m/Benson in order to demonstrate clinically significant improvement in community ambulation.   Baseline 08/25/16: 0.34 m/Benson   Time 8   Period Weeks   Status New               Plan - 09/29/16 [id number]    Clinical Impression Statement Patient twisted her ankle yesterday and is not able to perform standing balance exercises due to increased pain in standing. She performed exercises in supine and sidelying with correction in technique for increasing her hip strength bilaterally. She will continue to benefit from skilled PT to improve strength and decrease her falls risk.    Rehab Potential Good   Clinical Impairments Affecting Rehab Potential Positive: motivation, family support; Negative: chronicity, sedentary lifestyle   PT Frequency 2x / week   PT Duration 8 weeks   PT Treatment/Interventions ADLs/Self Care Home Management;Aquatic Therapy;DME Instruction;Gait training;Stair training;Functional mobility training;Therapeutic activities;Therapeutic exercise;Balance training;Neuromuscular re-education;Patient/family education;Manual techniques;Energy conservation   PT Next Visit Plan Have pt complete ABC, progress balance and strength   PT Home Exercise Plan sit to stand from elevated surface 2 x 10 qd, semitandem balance alternating forward LE 30s x 3   Consulted and Agree with Plan of Care Patient      Patient will benefit from skilled therapeutic intervention in order to improve the following deficits and impairments:  Abnormal gait, Decreased balance, Decreased mobility, Decreased strength  Visit Diagnosis: Unsteadiness on feet     Problem List Patient Active Problem List   Diagnosis Date Noted  . Depression with anxiety 08/24/2016  . Falls frequently 08/24/2016  . Generalized weakness 08/24/2016  . Urine frequency 11/19/2015  . Routine general medical examination at a health care facility 05/16/2015  . Dysuria  05/10/2014  . Breast calcifications on mammogram 03/01/2014  . Encounter for Medicare annual wellness exam 05/30/2013  . Breast calcification seen on mammogram 12/14/2012  . Breast calcification, left 11/28/2012  . History of falling 11/27/2012  . Poor balance 11/27/2012  . Colon cancer screening 05/10/2012  . Syncope 08/22/2011  . Osteoporosis 06/21/2007  . Diabetes type 2, controlled (HCC) 09/20/2006  . Hyperlipidemia associated with type 2 diabetes mellitus (HCC) 09/20/2006  . Essential hypertension 09/20/2006  . Osteoarthritis 09/20/2006   Amanda Benson, PT, DPT [place], Barkley BrunsKristine Benson 09/29/2016, 9:05  AM   [facility] MAIN [facility] SERVICES [address] [place], [place], [address] Phone: [phone]   Fax:  [phone]  Name: Amanda Benson MRN: [id number] Date of Birth: 1936/03/16

## 2016-10-04 ENCOUNTER — Encounter: Payer: Self-pay | Admitting: Physical Therapy

## 2016-10-04 ENCOUNTER — Ambulatory Visit: Payer: PPO | Attending: Family Medicine | Admitting: Physical Therapy

## 2016-10-04 DIAGNOSIS — R2681 Unsteadiness on feet: Secondary | ICD-10-CM | POA: Diagnosis not present

## 2016-10-04 NOTE — Therapy (Signed)
[place] MAIN [facility] SERVICES [place], [place], [address] Phone: [phone]   Fax:  [phone]  Physical Therapy Treatment/ Progress Note  Patient Details  Name: Amanda Benson MRN: [id number] Date of Birth: Jul 26, 1935 Referring Provider: Dr. Glori Bickers  Encounter Date: 10/04/2016      PT End of Session - 10/04/16 0932    Visit Number 10   Number of Visits 17   Date for PT Re-Evaluation 11/19/16   Authorization Type g codes 10/10   PT Start Time 0922   PT Stop Time 1000   PT Time Calculation (min) 38 min   Equipment Utilized During Treatment Gait belt   Activity Tolerance Patient tolerated treatment well   Behavior During Therapy [facility] for tasks assessed/performed      Past Medical History:  Diagnosis Date  . Depression   . Diabetes mellitus    type II  . Hyperlipidemia   . Hypertension   . Obesity   . Osteoporosis     Past Surgical History:  Procedure Laterality Date  . ANKLE SURGERY     left medial malleblus fracture  . CHOLECYSTECTOMY      There were no vitals filed for this visit.      Subjective Assessment - 10/04/16 0929    Subjective Patients ankle is feeling better and she wrapped it over the weekend. She is having a little bit of pain 5/10. She feels that her legs are getting stronger and not shaking.   Patient is accompained by: Family member  Daughter   Pertinent History Pt reports history of chronic lower extremity weakness and repeated falls for multiple years. She has fallen approximately 5 times since 29-May-2016. Pt has a rollator, two wheeled walker and three wheeled walkers at home. She generally uses the three wheeled walker. She generally falls when not using her walker but one of the recent falls occured while she was using her three wheeled walker. Pt reports LE buckling when she falls and not LOB. However with questioning she does report intermittently catching her foot on the wheel of her  walker. Daughter reports she has caught her mother and prevented her from falling 2 more times recently. No injuries with the falls. Denies N/T in bilateral LEs. Mild low back pain but she does not correlate with her LE weakness. Denies bowel/bladder changes or saddle paresthesia. ROS negative for red flags.    Patient Stated Goals Improve LE strength   Currently in Pain? Yes   Pain Score 5    Pain Location Ankle   Pain Orientation Right   Pain Descriptors / Indicators Aching   Pain Type Acute pain   Pain Onset Today   Pain Frequency Occasional   Aggravating Factors  standing   Pain Relieving Factors no   Effect of Pain on Daily Activities unable to stand   Multiple Pain Sites No      Patient performed outcome measures and Berg balance test today and has mad progress towards all goals today.                          PT Education - 10/04/16 0931    Education provided Yes   Education Details safety with mobility   Person(s) Educated Patient   Methods Explanation;Tactile cues   Comprehension Verbalized understanding;Returned demonstration;Verbal cues required             PT Long Term Goals - 10/04/16 0962  PT LONG TERM GOAL #1   Title Pt will be independent with HEP in order to improve strength and balance in order to decrease fall risk and improve function at home and work.    Time 8   Period Weeks   Status On-going     PT LONG TERM GOAL #2   Title  Pt will decrease TUG to below 15 seconds in order to demonstrate decreased fall risk    Baseline 08/25/16:   33s; 19 sec   Time 8   Period Weeks   Status Partially Met     PT LONG TERM GOAL #3   Title Pt will improve BERG by at least 3 points in order to demonstrate clinically significant improvement in balance.     Baseline 08/25/16: 31/56; 42/56   Time 8   Period Weeks   Status Partially Met     PT LONG TERM GOAL #4   Title Pt will increase 10MWT by at least 0.13 m/s in order to demonstrate  clinically significant improvement in community ambulation.   Baseline 08/25/16: 0.34 m/s; .82 m/sec   Time 8   Period Weeks   Status Partially Met               Plan - October 23, 2016 1007    Clinical Impression Statement Patient is able to perform outcome measures and is making progress towards goals and has decreased his falls risk. Patient is able to perform outcome measures and BERG balance test without increasing pain level to her ankle. Patient will benefit from skilled PT to improve falls risk and mobility   Rehab Potential Good   Clinical Impairments Affecting Rehab Potential Positive: motivation, family support; Negative: chronicity, sedentary lifestyle   PT Frequency 2x / week   PT Duration 8 weeks   PT Treatment/Interventions ADLs/Self Care Home Management;Aquatic Therapy;DME Instruction;Gait training;Stair training;Functional mobility training;Therapeutic activities;Therapeutic exercise;Balance training;Neuromuscular re-education;Patient/family education;Manual techniques;Energy conservation   PT Next Visit Plan Have pt complete ABC, progress balance and strength   PT Home Exercise Plan sit to stand from elevated surface 2 x 10 qd, semitandem balance alternating forward LE 30s x 3   Consulted and Agree with Plan of Care Patient      Patient will benefit from skilled therapeutic intervention in order to improve the following deficits and impairments:  Abnormal gait, Decreased balance, Decreased mobility, Decreased strength  Visit Diagnosis: Unsteadiness on feet       G-Codes - Oct 23, 2016 1119    Functional Assessment Tool Used (Outpatient Only) clinical judgement, TUG, 88mgait speed, BERG   Functional Limitation Mobility: Walking and moving around   Mobility: Walking and Moving Around Current Status [phone] At least 60 percent but less than 80 percent impaired, limited or restricted   Mobility: Walking and Moving Around Goal Status [phone] At least 40 percent but less than  60 percent impaired, limited or restricted      Problem List Patient Active Problem List   Diagnosis Date Noted  . Depression with anxiety 08/24/2016  . Falls frequently 08/24/2016  . Generalized weakness 08/24/2016  . Urine frequency 11/19/2015  . Routine general medical examination at a health care facility 05/16/2015  . Dysuria 05/10/2014  . Breast calcifications on mammogram 03/01/2014  . Encounter for Medicare annual wellness exam 05/30/2013  . Breast calcification seen on mammogram 12/14/2012  . Breast calcification, left 11/28/2012  . History of falling 11/27/2012  . Poor balance 11/27/2012  . Colon cancer screening 05/10/2012  . Syncope 08/22/2011  .  Osteoporosis 06/21/2007  . Diabetes type 2, controlled ([place]) 09/20/2006  . Hyperlipidemia associated with type 2 diabetes mellitus ([place]) 09/20/2006  . Essential hypertension 09/20/2006  . Osteoarthritis 09/20/2006   Alanson Puls, PT, DPT Arelia Sneddon S 10/04/2016, 11:23 AM  [place] MAIN [facility] SERVICES [address] [place], [place], [address] Phone: [phone]   Fax:  [phone]  Name: CHARITA LINDENBERGER MRN: [id number] Date of Birth: 1936/04/01

## 2016-10-11 ENCOUNTER — Encounter: Payer: Self-pay | Admitting: Physical Therapy

## 2016-10-11 ENCOUNTER — Ambulatory Visit: Payer: PPO | Admitting: Physical Therapy

## 2016-10-11 DIAGNOSIS — R2681 Unsteadiness on feet: Secondary | ICD-10-CM

## 2016-10-11 NOTE — Therapy (Signed)
[facility] MAIN [facility] SERVICES [address] [place], [place], [address] Phone: [phone]   Fax:  [phone]  Physical Therapy Treatment  Patient Details  Name: Amanda Benson MRN: [id number] Date of Birth: 18-Aug-1935 Referring Provider: Dr. Milinda Antis  Encounter Date: 10/11/2016      PT End of Session - 10/11/16 0845    Visit Number 11   Number of Visits 17   Date for PT Re-Evaluation 11-13-2016   Authorization Type g codes 1/10   PT Start Time 0837   PT Stop Time 0917   PT Time Calculation (min) 40 min   Equipment Utilized During Treatment Gait belt   Activity Tolerance Patient tolerated treatment well   Behavior During Therapy [facility] for tasks assessed/performed      Past Medical History:  Diagnosis Date  . Depression   . Diabetes mellitus    type II  . Hyperlipidemia   . Hypertension   . Obesity   . Osteoporosis     Past Surgical History:  Procedure Laterality Date  . ANKLE SURGERY     left medial malleblus fracture  . CHOLECYSTECTOMY      There were no vitals filed for this visit.      Subjective Assessment - 10/11/16 0841    Subjective Patient states her ankle has not been causing her pain over the last few days and overall getting better   Patient is accompained by: Family member   Pertinent History Pt reports history of chronic lower extremity weakness and repeated falls for multiple years. She has fallen approximately 5 times since January 2018. Pt has a rollator, two wheeled walker and three wheeled walkers at home. She generally uses the three wheeled walker. She generally falls when not using her walker but one of the recent falls occured while she was using her three wheeled walker. Pt reports LE buckling when she falls and not LOB. However with questioning she does report intermittently catching her foot on the wheel of her walker. Daughter reports she has caught her mother and prevented her from falling 2 more times  recently. No injuries with the falls. Denies N/T in bilateral LEs. Mild low back pain but she does not correlate with her LE weakness. Denies bowel/bladder changes or saddle paresthesia. ROS negative for red flags.    Limitations Walking   Patient Stated Goals Improve LE strength and begin walking   Currently in Pain? No/denies   Pain Score 0-No pain   Multiple Pain Sites No            Nu step warm up 5 minutes SL Leg Press 45 lbs 20 x each. Patient needs improvement with eccentric control  SLR 2 x 10 each Sidelying hip abduction 2 x 10 each. Moderate verbal cues for proper technique and glute med activation  Gait training CGA ~ 200 ft.  Patient demonstrated occasional dragging of feet that caused mild loss of balance.  Patient is not comfortable turning quickly due to fear of falling. No pain during ambulation today                     PT Education - 10/11/16 0844    Education provided Yes   Education Details Progressing therapy program to emphasize ambulation and recreational activities   Person(s) Educated Patient;Child(ren)   Methods Explanation   Comprehension Verbalized understanding             PT Long Term Goals - 10/04/16 7214  PT LONG TERM GOAL #1   Title Pt will be independent with HEP in order to improve strength and balance in order to decrease fall risk and improve function at home and work.    Time 8   Period Weeks   Status On-going     PT LONG TERM GOAL #2   Title  Pt will decrease TUG to below 15 seconds in order to demonstrate decreased fall risk    Baseline 08/25/16:   33s; 19 sec   Time 8   Period Weeks   Status Partially Met     PT LONG TERM GOAL #3   Title Pt will improve BERG by at least 3 points in order to demonstrate clinically significant improvement in balance.     Baseline 08/25/16: 31/56; 42/56   Time 8   Period Weeks   Status Partially Met     PT LONG TERM GOAL #4   Title Pt will increase 10MWT by at least  0.13 m/s in order to demonstrate clinically significant improvement in community ambulation.   Baseline 08/25/16: 0.34 m/s; .82 m/sec   Time 8   Period Weeks   Status Partially Met               Plan - 10/11/16 0845    Clinical Impression Statement Patient was able to tolerate treatment session well today and did not complain of any pain during exercises.  Moderate cues necessary for therex performed on the mat for proper muscle activation.  Patient demonstrates decreased dorsiflexion and inadepquate foot clearing ability causing occasional tripping and loss of balance.   Clinical Presentation Stable   Clinical Presentation due to: No episodes of increased pain   Clinical Decision Making Low   Rehab Potential Good   Clinical Impairments Affecting Rehab Potential Positive: motivation, family support; Negative: chronicity, sedentary lifestyle   PT Frequency 2x / week   PT Duration 8 weeks   PT Treatment/Interventions ADLs/Self Care Home Management;Aquatic Therapy;DME Instruction;Gait training;Stair training;Functional mobility training;Therapeutic activities;Therapeutic exercise;Balance training;Neuromuscular re-education;Patient/family education;Manual techniques;Energy conservation   PT Next Visit Plan Progress strength and improve eccentric control of BLE   PT Home Exercise Plan Walk around house without using UE assist   Consulted and Agree with Plan of Care Patient      Patient will benefit from skilled therapeutic intervention in order to improve the following deficits and impairments:  Abnormal gait, Decreased balance, Decreased mobility, Decreased strength  Visit Diagnosis: Unsteadiness on feet     Problem List Patient Active Problem List   Diagnosis Date Noted  . Depression with anxiety 08/24/2016  . Falls frequently 08/24/2016  . Generalized weakness 08/24/2016  . Urine frequency 11/19/2015  . Routine general medical examination at a health care facility  05/16/2015  . Dysuria 05/10/2014  . Breast calcifications on mammogram 03/01/2014  . Encounter for Medicare annual wellness exam 05/30/2013  . Breast calcification seen on mammogram 12/14/2012  . Breast calcification, left 11/28/2012  . History of falling 11/27/2012  . Poor balance 11/27/2012  . Colon cancer screening 05/10/2012  . Syncope 08/22/2011  . Osteoporosis 06/21/2007  . Diabetes type 2, controlled ([place]) 09/20/2006  . Hyperlipidemia associated with type 2 diabetes mellitus ([place]) 09/20/2006  . Essential hypertension 09/20/2006  . Osteoarthritis 09/20/2006   Sheliah Plane SPT  [place], Kristine S 10/11/2016, 11:24 AM  This entire session was performed under direct supervision and direction of a licensed therapist/therapist assistant . I have personally read, edited and approve of the note as  written. Alanson Puls, PT, DPT  [place] MAIN [facility] SERVICES [address] [place], [place], [address] Phone: [phone]   Fax:  [phone]  Name: KYLEIGH NANNINI MRN: [id number] Date of Birth: 13-Oct-1935

## 2016-10-13 ENCOUNTER — Encounter: Payer: Self-pay | Admitting: Physical Therapy

## 2016-10-13 ENCOUNTER — Ambulatory Visit: Payer: PPO | Admitting: Physical Therapy

## 2016-10-13 DIAGNOSIS — R2681 Unsteadiness on feet: Secondary | ICD-10-CM | POA: Diagnosis not present

## 2016-10-13 NOTE — Therapy (Signed)
[place] MAIN [facility] SERVICES [address] [place], [place], [address] Phone: [phone]   Fax:  [phone]  Physical Therapy Treatment  Patient Details  Name: Amanda Benson MRN: [id number] Date of Birth: January 17, 1936 Referring Provider: Dr. Glori Bickers  Encounter Date: 10/13/2016      PT End of Session - 10/13/16 0840    Visit Number 12   Number of Visits 17   Date for PT Re-Evaluation 2016/11/03   Authorization Type g codes 2022/05/29   PT Start Time 0830   PT Stop Time 0915   PT Time Calculation (min) 45 min   Equipment Utilized During Treatment Gait belt   Activity Tolerance Patient tolerated treatment well;Patient limited by fatigue      Past Medical History:  Diagnosis Date  . Depression   . Diabetes mellitus    type II  . Hyperlipidemia   . Hypertension   . Obesity   . Osteoporosis     Past Surgical History:  Procedure Laterality Date  . ANKLE SURGERY     left medial malleblus fracture  . CHOLECYSTECTOMY      There were no vitals filed for this visit.      Subjective Assessment - 10/13/16 0838    Subjective Patient states having very sore quads and hip flexors due to treatment on 10/11/16 and HEP yesterday.   Patient is accompained by: Family member   Limitations Walking   Patient Stated Goals Improve LE strength and begin walking   Currently in Pain? No/denies   Pain Score 0-No pain   Multiple Pain Sites No       Treatment:  Nu Step warm up 5 minutes   Gait for  10 minutes without AD and   CGA for safety; patient occasionally trips due to inadequate R foot clearance but is able to catch herself.  L foot shows increased pronation and toe-in Pre-gait training: step taps on treadmill.  Patient had slightly more episodes of LOB while on L foot.  Poor foot clearing ability.  SL leg press 45 lbs 30 x each.  Decreased weight today due to soreness in B LE  Supine hip flexor/quad stretch thomas position with MET 2 minutes  each leg.  Patient shows moderate tone and tightness that decreased after stretching  Two rest breaks today but patient tolerated therapy well despite soreness.                          PT Education - 10/13/16 0839    Education provided Yes   Education Details Continue challenging balance and ambulation without A device    Person(s) Educated Patient;Child(ren)   Methods Explanation   Comprehension Verbalized understanding             PT Long Term Goals - 10/04/16 0936      PT LONG TERM GOAL #1   Title Pt will be independent with HEP in order to improve strength and balance in order to decrease fall risk and improve function at home and work.    Time 8   Period Weeks   Status On-going     PT LONG TERM GOAL #2   Title  Pt will decrease TUG to below 15 seconds in order to demonstrate decreased fall risk    Baseline 08/25/16:   33s; 19 sec   Time 8   Period Weeks   Status Partially Met     PT LONG TERM GOAL #  3   Title Pt will improve BERG by at least 3 points in order to demonstrate clinically significant improvement in balance.     Baseline 08/25/16: 31/56; 42/56   Time 8   Period Weeks   Status Partially Met     PT LONG TERM GOAL #4   Title Pt will increase 10MWT by at least 0.13 m/s in order to demonstrate clinically significant improvement in community ambulation.   Baseline 08/25/16: 0.34 m/s; .82 m/sec   Time 8   Period Weeks   Status Partially Met               Plan - 10/13/16 0902    Clinical Impression Statement Patient had very sore hip flexors and quads today  strengthening therex were modified.   Patient still demonstrates occasional inadequate R foot clearance during ambulation without an assistive device.  Pre-gait training was implemented to address tripping/foot clearing impairments and was tolerated well with moderate cues but had a few episodes of LOB. Patient will benefit from skilled physical therapy to increase strength and  decrease risk of falls.   Clinical Presentation Stable   Clinical Presentation due to: No recent falls   Clinical Decision Making Low   Rehab Potential Good   Clinical Impairments Affecting Rehab Potential Positive: motivation, family support; Negative: chronicity, sedentary lifestyle   PT Frequency 2x / week   PT Duration 8 weeks   PT Treatment/Interventions ADLs/Self Care Home Management;Aquatic Therapy;DME Instruction;Gait training;Stair training;Functional mobility training;Therapeutic activities;Therapeutic exercise;Balance training;Neuromuscular re-education;Patient/family education;Manual techniques;Energy conservation   PT Next Visit Plan Progress strength and improve eccentric control of BLE   PT Home Exercise Plan Walk around house without using UE assist   Consulted and Agree with Plan of Care Patient      Patient will benefit from skilled therapeutic intervention in order to improve the following deficits and impairments:  Abnormal gait, Decreased balance, Decreased mobility, Decreased strength  Visit Diagnosis: Unsteadiness on feet     Problem List Patient Active Problem List   Diagnosis Date Noted  . Depression with anxiety 08/24/2016  . Falls frequently 08/24/2016  . Generalized weakness 08/24/2016  . Urine frequency 11/19/2015  . Routine general medical examination at a health care facility 05/16/2015  . Dysuria 05/10/2014  . Breast calcifications on mammogram 03/01/2014  . Encounter for Medicare annual wellness exam 05/30/2013  . Breast calcification seen on mammogram 12/14/2012  . Breast calcification, left 11/28/2012  . History of falling 11/27/2012  . Poor balance 11/27/2012  . Colon cancer screening 05/10/2012  . Syncope 08/22/2011  . Osteoporosis 06/21/2007  . Diabetes type 2, controlled ([place]) 09/20/2006  . Hyperlipidemia associated with type 2 diabetes mellitus ([place]) 09/20/2006  . Essential hypertension 09/20/2006  . Osteoarthritis 09/20/2006    Sheliah Plane SPT  This entire session was performed under direct supervision and direction of a licensed therapist/therapist assistant . I have personally read, edited and approve of the note as written. [place], [place], DPT 10/13/2016, 9:28 AM  [place] MAIN [facility] SERVICES [address] [place], [place], [address] Phone: [phone]   Fax:  [phone]  Name: JOELY LOSIER MRN: [id number] Date of Birth: 08-26-1935

## 2016-10-14 ENCOUNTER — Other Ambulatory Visit: Payer: Self-pay | Admitting: *Deleted

## 2016-10-14 NOTE — Patient Outreach (Signed)
[facility] Screening call, unsuccessful but left a message for a return call. If I do not hear back from the member I will try again within the week.  Iviona Hole C. Cortlin Marano, MSN, GNP-BC Gerontological Nurse Practitioner [facility] Care Management [phone]  

## 2016-10-15 ENCOUNTER — Other Ambulatory Visit: Payer: Self-pay | Admitting: *Deleted

## 2016-10-15 NOTE — Patient Outreach (Signed)
[facility] 2nd Screening call, unsuccessful but left a message for a return call. If I do not hear back from the member I will try again within the week.  Hervey Wedig C. Perrion Diesel, MSN, GNP-BC Gerontological Nurse Practitioner [facility] Care [facility] [phone]    

## 2016-10-18 ENCOUNTER — Encounter: Payer: Self-pay | Admitting: *Deleted

## 2016-10-18 ENCOUNTER — Other Ambulatory Visit: Payer: Self-pay | Admitting: *Deleted

## 2016-10-18 ENCOUNTER — Encounter: Payer: Self-pay | Admitting: Physical Therapy

## 2016-10-18 ENCOUNTER — Ambulatory Visit: Payer: PPO

## 2016-10-18 DIAGNOSIS — R2681 Unsteadiness on feet: Secondary | ICD-10-CM | POA: Diagnosis not present

## 2016-10-18 NOTE — Therapy (Signed)
[place] [facility] MAIN [facility] SERVICES [address] [place], [place], [address] Phone: [phone]   Fax:  [phone]  Physical Therapy Treatment  Patient Details  Name: Amanda Benson MRN: [id number] Date of Birth: 08-Nov-1935 Referring Provider: Dr. Milinda Antis  Encounter Date: 10/18/2016      PT End of Session - 10/18/16 0854    Visit Number 13   Number of Visits 17   Date for PT Re-Evaluation 10-26-16   Authorization Type g codes 06-19-2022   PT Start Time 0845   PT Stop Time 0930   PT Time Calculation (min) 45 min   Equipment Utilized During Treatment Gait belt   Activity Tolerance Patient tolerated treatment well   Behavior During Therapy [facility] for tasks assessed/performed      Past Medical History:  Diagnosis Date  . Depression   . Diabetes mellitus    type II  . Hyperlipidemia   . Hypertension   . Obesity   . Osteoporosis     Past Surgical History:  Procedure Laterality Date  . ANKLE SURGERY     left medial malleblus fracture  . CHOLECYSTECTOMY      There were no vitals filed for this visit.      Subjective Assessment - 10/18/16 0852    Subjective Patient states having no falls or near falls over the weekend and is "ready to get out of here".   Patient is accompained by: Family member   Pertinent History Pt reports history of chronic lower extremity weakness and repeated falls for multiple years. She has fallen approximately 5 times since January 2018. Pt has a rollator, two wheeled walker and three wheeled walkers at home. She generally uses the three wheeled walker. She generally falls when not using her walker but one of the recent falls occured while she was using her three wheeled walker. Pt reports LE buckling when she falls and not LOB. However with questioning she does report intermittently catching her foot on the wheel of her walker. Daughter reports she has caught her mother and prevented her from falling 2 more times recently.  No injuries with the falls. Denies N/T in bilateral LEs. Mild low back pain but she does not correlate with her LE weakness. Denies bowel/bladder changes or saddle paresthesia. ROS negative for red flags.    Limitations Walking   Currently in Pain? No/denies   Pain Score 0-No pain   Multiple Pain Sites No         Treatment:  Nu Step warm up 5 minutes   Step up training 6 inch block 20 minutes.  Patient required CGA for safety and moderate verbal and tactile cues to improve foot clearing ability and stability.  UA assist necessary throughout exercise due to frequent LOB. Patient's R knee collapsed 3 times during exercise due to R quad weakness.  SL leg press 60 lbs 20 x each. Patient demonstrated better eccentric control today. SLR 2 x10 each leg. Patient demonstrates poor tib anterior recruitment and weakness in quads and hip flexors.  Patient is unable to keep full knee extension throughout exercise.  Good muscular fatigue with today's treatment.                        PT Education - 10/18/16 0853    Education provided Yes   Education Details Continue improving dynamic balance and foot clearing ability to reduce risk of falls   Person(s) Educated Patient;Child(ren)   Methods Explanation  Comprehension Verbalized understanding             PT Long Term Goals - 10/04/16 0936      PT LONG TERM GOAL #1   Title Pt will be independent with HEP in order to improve strength and balance in order to decrease fall risk and improve function at home and work.    Time 8   Period Weeks   Status On-going     PT LONG TERM GOAL #2   Title  Pt will decrease TUG to below 15 seconds in order to demonstrate decreased fall risk    Baseline 08/25/16:   33s; 19 sec   Time 8   Period Weeks   Status Partially Met     PT LONG TERM GOAL #3   Title Pt will improve BERG by at least 3 points in order to demonstrate clinically significant improvement in balance.     Baseline  08/25/16: 31/56; 42/56   Time 8   Period Weeks   Status Partially Met     PT LONG TERM GOAL #4   Title Pt will increase 10MWT by at least 0.13 m/s in order to demonstrate clinically significant improvement in community ambulation.   Baseline 08/25/16: 0.34 m/s; .82 m/sec   Time 8   Period Weeks   Status Partially Met               Plan - 10/18/16 0948    Clinical Impression Statement Patient walked into clinic today; normally is in [facility]. Patient still presents poor foot clearing ability and frequently loses balance when dynamic balance is challenged.  Patient demonstrated better quad eccentric control during leg press exercise but struggles during dynamic/gait training causing LOB. Patient will continue to benefit from skilled physical therapy to decrease risk of falls.   Rehab Potential Good   Clinical Impairments Affecting Rehab Potential Positive: motivation, family support; Negative: chronicity, sedentary lifestyle   PT Frequency 2x / week   PT Duration 8 weeks   PT Treatment/Interventions ADLs/Self Care Home Management;Aquatic Therapy;DME Instruction;Gait training;Stair training;Functional mobility training;Therapeutic activities;Therapeutic exercise;Balance training;Neuromuscular re-education;Patient/family education;Manual techniques;Energy conservation   PT Next Visit Plan Repeat outcome measures, update goals, recert vs discharge; Progress strength and improve eccentric control of BLE   PT Home Exercise Plan Walk around house without using UE assist   Consulted and Agree with Plan of Care Patient      Patient will benefit from skilled therapeutic intervention in order to improve the following deficits and impairments:  Abnormal gait, Decreased balance, Decreased mobility, Decreased strength  Visit Diagnosis: Unsteadiness on feet     Problem List Patient Active Problem List   Diagnosis Date Noted  . Depression with anxiety 08/24/2016  . Falls frequently 08/24/2016   . Generalized weakness 08/24/2016  . Urine frequency 11/19/2015  . Routine general medical examination at a health care facility 05/16/2015  . Dysuria 05/10/2014  . Breast calcifications on mammogram 03/01/2014  . Encounter for Medicare annual wellness exam 05/30/2013  . Breast calcification seen on mammogram 12/14/2012  . Breast calcification, left 11/28/2012  . History of falling 11/27/2012  . Poor balance 11/27/2012  . Colon cancer screening 05/10/2012  . Syncope 08/22/2011  . Osteoporosis 06/21/2007  . Diabetes type 2, controlled ([place]) 09/20/2006  . Hyperlipidemia associated with type 2 diabetes mellitus ([place]) 09/20/2006  . Essential hypertension 09/20/2006  . Osteoarthritis 09/20/2006    This entire session was performed under direct supervision and direction of a licensed therapist/therapist assistant .  I have personally read, edited and approve of the note as written.   Sheliah Plane SPT  Phillips Grout PT, DPT   [place],Jason 10/18/2016, 2:20 PM  [place] MAIN [facility] SERVICES [address] [place], [place], [address] Phone: [phone]   Fax:  [phone]  Name: Amanda Benson MRN: [id number] Date of Birth: 06/14/1935

## 2016-10-20 ENCOUNTER — Ambulatory Visit: Payer: PPO

## 2016-10-20 DIAGNOSIS — R2681 Unsteadiness on feet: Secondary | ICD-10-CM

## 2016-10-20 NOTE — Therapy (Addendum)
[place] MAIN [facility] SERVICES [address] [place], [place], [address] Phone: [phone]   Fax:  [phone]  Physical Therapy Treatment/Discharge  Patient Details  Name: Amanda Benson MRN: [id number] Date of Birth: 1935/05/06 Referring Provider: Dr. Glori Bickers  Encounter Date: 10/27/2016      PT End of Session - 10-27-16 0938    Visit Number 14   Number of Visits 17   Date for PT Re-Evaluation 10-27-2016   Authorization Type g codes 2022-07-21   PT Start Time 0845   PT Stop Time 0930   PT Time Calculation (min) 45 min   Equipment Utilized During Treatment Gait belt   Activity Tolerance Patient tolerated treatment well   Behavior During Therapy [facility] for tasks assessed/performed      Past Medical History:  Diagnosis Date  . Depression   . Diabetes mellitus    type II  . Hyperlipidemia   . Hypertension   . Obesity   . Osteoporosis     Past Surgical History:  Procedure Laterality Date  . ANKLE SURGERY     left medial malleblus fracture  . CHOLECYSTECTOMY      There were no vitals filed for this visit.      Subjective Assessment - 10-27-2016 0937    Subjective Patient states feeling tired today. Otherwise no specific questions or concerns at this time. Denies pain   Patient is accompained by: Family member   Pertinent History Pt reports history of chronic lower extremity weakness and repeated falls for multiple years. She has fallen approximately 5 times since January 2018. Pt has a rollator, two wheeled walker and three wheeled walkers at home. She generally uses the three wheeled walker. She generally falls when not using her walker but one of the recent falls occured while she was using her three wheeled walker. Pt reports LE buckling when she falls and not LOB. However with questioning she does report intermittently catching her foot on the wheel of her walker. Daughter reports she has caught her mother and prevented her from falling 2  more times recently. No injuries with the falls. Denies N/T in bilateral LEs. Mild low back pain but she does not correlate with her LE weakness. Denies bowel/bladder changes or saddle paresthesia. ROS negative for red flags.    Limitations Walking   Patient Stated Goals Improve LE strength and begin walking   Currently in Pain? No/denies   Multiple Pain Sites No         Treatment:  Ther-ex Completed outcome measures including TUG, BERG, and 10MWT. See goals section for scores, 3/4 long term goals met for therapy. Updated goals and discussed progress with patient and daughter; Discussed discharge options with patient and patient's daughter; consensus to discharge and progress with independent HEP. Patient was given RTB and BTB in preparation for progression of HEP. Demonstration of tandem and SLS balance therex given. Instruction given to maximize safety in the home during balance therex demonstrated and understood well by patient.                          PT Education - 2016/10/27 [phone]    Education provided Yes   Education Details Discharge education and HEP instruction   Person(s) Educated Patient;Child(ren)   Methods Explanation   Comprehension Verbalized understanding             PT Long Term Goals - 10/27/2016 0859      PT  LONG TERM GOAL #1   Title Pt will be independent with HEP in order to improve strength and balance in order to decrease fall risk and improve function at home and work.    Time 8   Period Weeks   Status Achieved     PT LONG TERM GOAL #2   Title  Pt will decrease TUG to below 15 seconds in order to demonstrate decreased fall risk    Baseline 08/25/16:   33s; 19 sec  10-29-2017: 13.63 sec   Time 8   Period Weeks   Status Achieved     PT LONG TERM GOAL #3   Title Pt will improve BERG by at least 3 points in order to demonstrate clinically significant improvement in balance.     Baseline 08/25/16: 31/56; 42/56  29-Oct-2016: 39/56   Time 8    Period Weeks   Status Achieved     PT LONG TERM GOAL #4   Title Pt will increase 10MWT by at least 0.13 m/s in order to demonstrate clinically significant improvement in community ambulation.   Baseline 08/25/16: 0.34 m/s; .82 m/sec  10/29/16: .83 m/s   Time 8   Period Weeks   Status Achieved               Plan - October 29, 2016 0939    Clinical Impression Statement Patient was able to walk into therapy without an assistive device and completed outcome measures for discharge; TUG decreased by 6 seconds, BERG score decreased by 3 points, and no improvement in gait speed during 10 MWT.  Patient and daughter were educated regarding outcome measure scores. Conversation with patient and patient's daughter to discuss discharge or continuation of therapy; consensus by therapist, patient, and daughter to discharge from therapy at this time. Discharge information consisted of HEP instruction, progression of therex, and ambulation in community setting. Pt encouraged to return for additional therapy if she does not continue to improve or declines with home program. Daughter feels confident continuing exercise program with patient.    Clinical Presentation Stable   Clinical Decision Making Low   Rehab Potential Good   Clinical Impairments Affecting Rehab Potential Positive: motivation, family support; Negative: chronicity, sedentary lifestyle   PT Frequency 2x / week   PT Duration 8 weeks   PT Treatment/Interventions ADLs/Self Care Home Management;Aquatic Therapy;DME Instruction;Gait training;Stair training;Functional mobility training;Therapeutic activities;Therapeutic exercise;Balance training;Neuromuscular re-education;Patient/family education;Manual techniques;Energy conservation   PT Next Visit Plan Repeat outcome measures, update goals, recert vs discharge; Progress strength and improve eccentric control of BLE   PT Home Exercise Plan Walk around house without using UE assist   Consulted and Agree  with Plan of Care Patient      Patient will benefit from skilled therapeutic intervention in order to improve the following deficits and impairments:  Abnormal gait, Decreased balance, Decreased mobility, Decreased strength  Visit Diagnosis: Unsteadiness on feet       G-Codes - Oct 29, 2016 1314    Functional Assessment Tool Used (Outpatient Only) clinical judgement, TUG, 6mgait speed, BERG   Functional Limitation Mobility: Walking and moving around   Mobility: Walking and Moving Around Current Status (([id number] At least 40 percent but less than 60 percent impaired, limited or restricted   Mobility: Walking and Moving Around Goal Status [phone] At least 40 percent but less than 60 percent impaired, limited or restricted      Problem List Patient Active Problem List   Diagnosis Date Noted  . Depression with anxiety 08/24/2016  .  Falls frequently 08/24/2016  . Generalized weakness 08/24/2016  . Urine frequency 11/19/2015  . Routine general medical examination at a health care facility 05/16/2015  . Dysuria 05/10/2014  . Breast calcifications on mammogram 03/01/2014  . Encounter for Medicare annual wellness exam 05/30/2013  . Breast calcification seen on mammogram 12/14/2012  . Breast calcification, left 11/28/2012  . History of falling 11/27/2012  . Poor balance 11/27/2012  . Colon cancer screening 05/10/2012  . Syncope 08/22/2011  . Osteoporosis 06/21/2007  . Diabetes type 2, controlled ([place]) 09/20/2006  . Hyperlipidemia associated with type 2 diabetes mellitus ([place]) 09/20/2006  . Essential hypertension 09/20/2006  . Osteoarthritis 09/20/2006   This entire session was performed under direct supervision and direction of a licensed therapist/therapist assistant . I have personally read, edited and approve of the note as written.     SPT [place], DPT   Huprich,Jason 10/20/2016, 1:18 PM  [place] MAIN [facility]  SERVICES [address] [place], [place], [address] Phone: [phone]   Fax:  [phone]  Name: Amanda Benson MRN: [id number] Date of Birth: 1935/11/20

## 2016-10-25 ENCOUNTER — Ambulatory Visit: Payer: PPO | Admitting: Physical Therapy

## 2016-10-27 ENCOUNTER — Ambulatory Visit: Payer: PPO | Admitting: Physical Therapy

## 2016-11-01 ENCOUNTER — Ambulatory Visit: Payer: PPO | Admitting: Physical Therapy

## 2016-11-03 ENCOUNTER — Ambulatory Visit: Payer: PPO | Admitting: Physical Therapy

## 2016-11-08 ENCOUNTER — Ambulatory Visit: Payer: PPO | Admitting: Physical Therapy

## 2016-11-10 ENCOUNTER — Ambulatory Visit: Payer: PPO | Admitting: Physical Therapy

## 2016-11-15 ENCOUNTER — Ambulatory Visit: Payer: PPO | Admitting: Physical Therapy

## 2016-11-17 ENCOUNTER — Ambulatory Visit: Payer: PPO | Admitting: Physical Therapy

## 2016-11-18 ENCOUNTER — Other Ambulatory Visit (INDEPENDENT_AMBULATORY_CARE_PROVIDER_SITE_OTHER): Payer: PPO

## 2016-11-18 DIAGNOSIS — E119 Type 2 diabetes mellitus without complications: Secondary | ICD-10-CM | POA: Diagnosis not present

## 2016-11-18 LAB — COMPREHENSIVE METABOLIC PANEL
ALT: 16 U/L (ref 0–35)
AST: 20 U/L (ref 0–37)
Albumin: 3.2 g/dL — ABNORMAL LOW (ref 3.5–5.2)
Alkaline Phosphatase: 48 U/L (ref 39–117)
BUN: 16 mg/dL (ref 6–23)
CHLORIDE: 102 meq/L (ref 96–112)
CO2: 34 meq/L — AB (ref 19–32)
CREATININE: 0.92 mg/dL (ref 0.40–1.20)
Calcium: 9.3 mg/dL (ref 8.4–10.5)
GFR: 62.53 mL/min (ref >60.00)
GLUCOSE: 140 mg/dL — AB (ref 70–99)
Potassium: 3.9 mEq/L (ref 3.5–5.1)
SODIUM: 142 meq/L (ref 135–145)
Total Bilirubin: 0.4 mg/dL (ref 0.2–1.2)
Total Protein: 5.8 g/dL — ABNORMAL LOW (ref 6.0–8.3)

## 2016-11-18 LAB — HEMOGLOBIN A1C: Hgb A1c MFr Bld: 6.8 % — ABNORMAL HIGH (ref 4.6–6.5)

## 2016-11-24 ENCOUNTER — Ambulatory Visit (INDEPENDENT_AMBULATORY_CARE_PROVIDER_SITE_OTHER): Payer: PPO | Admitting: Family Medicine

## 2016-11-24 ENCOUNTER — Encounter: Payer: Self-pay | Admitting: Family Medicine

## 2016-11-24 VITALS — BP 135/60 | HR 80 | Temp 98.5°F | Ht 63.0 in | Wt 150.8 lb

## 2016-11-24 DIAGNOSIS — R296 Repeated falls: Secondary | ICD-10-CM | POA: Diagnosis not present

## 2016-11-24 DIAGNOSIS — R2689 Other abnormalities of gait and mobility: Secondary | ICD-10-CM | POA: Diagnosis not present

## 2016-11-24 DIAGNOSIS — R531 Weakness: Secondary | ICD-10-CM

## 2016-11-24 DIAGNOSIS — E119 Type 2 diabetes mellitus without complications: Secondary | ICD-10-CM | POA: Diagnosis not present

## 2016-11-24 DIAGNOSIS — I10 Essential (primary) hypertension: Secondary | ICD-10-CM

## 2016-11-24 NOTE — Patient Instructions (Addendum)
Diabetes control is better  Continue watching diet and exercising (even in the winter)  Blood pressure is better on 2nd check  You are due for an eye exam in October-do not forget to schedule that  No change in medicines   Follow up in 6 months with labs prior

## 2016-11-24 NOTE — Progress Notes (Signed)
Subjective:    Patient ID: Amanda Benson, female    DOB: 15-Nov-1935, 81 y.o.   MRN: [id number]  HPI  Here for f/u of chronic medical problems  Feeling fine   Wt Readings from Last 3 Encounters:  11/24/16 150 lb 12 oz (68.4 kg)  08/24/16 152 lb 4 oz (69.1 kg)  08/17/16 150 lb 8 oz (68.3 kg)  wt is down 2 lb  26.70 kg/m   bp is stable today  No cp or palpitations or headaches or edema  No side effects to medicines  BP Readings from Last 3 Encounters:  11/24/16 (!) 144/62  08/25/16 (!) 178/56  08/24/16 138/60    Better on 2nd check at rest BP: 135/60    Diabetes Home sugar results -no highs or lows  140 at her lab draw non fasting  DM diet -not eating any differently but has smaller portions without overdoing it (her daughter has taken over serving her)   Tries to avoid sweets (no more cookies)  Exercise - going to PT for balance / got some exercise (doing at home now)  Symptoms-none  A1C last  Lab Results  Component Value Date   HGBA1C 6.8 (H) 11/18/2016  this is down from 7.1  No problems with medications  Glipizide and metformin  Renal protection  ARB Last eye exam  10/17  Was ref to gait training for falls after last visit  Walking is getting better and still working on balance  Can do more independently now  Can start walking now   Taking evista now for OP  No side effects -doing well No falls   Patient Active Problem List   Diagnosis Date Noted  . Depression with anxiety 08/24/2016  . Falls frequently 08/24/2016  . Generalized weakness 08/24/2016  . Urine frequency 11/19/2015  . Routine general medical examination at a health care facility 05/16/2015  . Dysuria 05/10/2014  . Breast calcifications on mammogram 03/01/2014  . Encounter for Medicare annual wellness exam 05/30/2013  . Breast calcification seen on mammogram 12/14/2012  . Breast calcification, left 11/28/2012  . History of falling 11/27/2012  . Poor balance 11/27/2012  . Colon cancer  screening 05/10/2012  . Osteoporosis 06/21/2007  . Diabetes type 2, controlled ([place]) 09/20/2006  . Hyperlipidemia associated with type 2 diabetes mellitus ([place]) 09/20/2006  . Essential hypertension 09/20/2006  . Osteoarthritis 09/20/2006   Past Medical History:  Diagnosis Date  . Depression   . Diabetes mellitus    type II  . Hyperlipidemia   . Hypertension   . Obesity   . Osteoporosis    Past Surgical History:  Procedure Laterality Date  . ANKLE SURGERY     left medial malleblus fracture  . CHOLECYSTECTOMY     Social History  Substance Use Topics  . Smoking status: Never Smoker  . Smokeless tobacco: Never Used  . Alcohol use No   Family History  Problem Relation Age of Onset  . Diabetes Mother   . Hypertension Mother   . Asthma Father   . Hypertension Sister   . Cancer Sister        lung  . Asthma Brother   . COPD Brother   . Cancer Maternal Grandmother        breast   . Asthma Brother    Allergies  Allergen Reactions  . Alendronate Sodium     REACTION: heartburn   Current Outpatient Prescriptions on File Prior to Visit  Medication Sig Dispense Refill  .  amLODipine (NORVASC) 10 MG tablet Take 1 tablet (10 mg total) by mouth daily. 30 tablet 11  . aspirin 81 MG EC tablet Take 81 mg by mouth daily.      . Blood Glucose Monitoring Suppl (ONE TOUCH ULTRA SYSTEM KIT) W/DEVICE KIT To check sugar daily and as needed for DM2 250.0  1 each 0  . buPROPion (WELLBUTRIN XL) 300 MG 24 hr tablet Take 1 tablet (300 mg total) by mouth daily. 90 tablet 3  . FLUoxetine (PROZAC) 40 MG capsule Take 1 capsule (40 mg total) by mouth daily. 90 capsule 3  . glipiZIDE (GLUCOTROL XL) 10 MG 24 hr tablet Take 1 tablet (10 mg total) by mouth daily. 90 tablet 3  . losartan-hydrochlorothiazide (HYZAAR) 100-25 MG tablet Take 1 tablet by mouth daily. 30 tablet 11  . metFORMIN (GLUCOPHAGE) 1000 MG tablet Take 1 tablet (1,000 mg total) by mouth 2 (two) times daily with a meal. 180 tablet 3  .  raloxifene (EVISTA) 60 MG tablet Take 1 tablet (60 mg total) by mouth daily. 30 tablet 11  . simvastatin (ZOCOR) 20 MG tablet Take 1 tablet (20 mg total) by mouth daily. 90 tablet 3   No current facility-administered medications on file prior to visit.      Review of Systems Review of Systems  Constitutional: Negative for fever, appetite change, fatigue and unexpected weight change.  Eyes: Negative for pain and visual disturbance.  Respiratory: Negative for cough and shortness of breath.   Cardiovascular: Negative for cp or palpitations    Gastrointestinal: Negative for nausea, diarrhea and constipation.  Genitourinary: Negative for urgency and frequency.  Skin: Negative for pallor or rash   Neurological: Negative for weakness, light-headedness, numbness and headaches. pos for  Poor balance which is improving in PT along with strength  Hematological: Negative for adenopathy. Does not bruise/bleed easily.  Psychiatric/Behavioral: Negative for dysphoric mood. The patient is not nervous/anxious.         Objective:   Physical Exam  Constitutional: She appears well-developed and well-nourished. No distress.  Well appearing  More stable gait and more energetic today  HENT:  Head: Normocephalic and atraumatic.  Mouth/Throat: Oropharynx is clear and moist.  Eyes: Pupils are equal, round, and reactive to light. Conjunctivae and EOM are normal.  Neck: Normal range of motion. Neck supple. No JVD present. Carotid bruit is not present. No thyromegaly present.  Cardiovascular: Normal rate, regular rhythm, normal heart sounds and intact distal pulses.  Exam reveals no gallop.   Pulmonary/Chest: Effort normal and breath sounds normal. No respiratory distress. She has no wheezes. She has no rales.  No crackles  Abdominal: Soft. Bowel sounds are normal. She exhibits no distension, no abdominal bruit and no mass. There is no tenderness.  Musculoskeletal: She exhibits no edema.  Lymphadenopathy:     She has no cervical adenopathy.  Neurological: She is alert. She has normal reflexes. No cranial nerve deficit. She exhibits normal muscle tone. Coordination normal.  Gait is more stable Can get up on the table with less help/effort    Skin: Skin is warm and dry. No rash noted. No pallor.  Psychiatric: She has a normal mood and affect.  Improved affect today           Assessment & Plan:   Problem List Items Addressed This Visit      Cardiovascular and Mediastinum   Essential hypertension - Primary    bp in fair control at this time  BP Readings from  Last 1 Encounters:  11/24/16 135/60   No changes needed Disc lifstyle change with low sodium diet and exercise  Labs rev         Endocrine   Diabetes type 2, controlled ([place])    Lab Results  Component Value Date   HGBA1C 6.8 (H) 11/18/2016   Improved with more exercise and cutting back sweets  More motivated  [facility] care F/u 6 mo        Other   Falls frequently    In PT and no falls since  Doing better      Generalized weakness    Improving with PT  Can get on the table more easily and gait is more stable today      Poor balance    Improving with PT

## 2016-11-25 NOTE — Assessment & Plan Note (Signed)
Lab Results  Component Value Date   HGBA1C 6.8 (H) 11/18/2016   Improved with more exercise and cutting back sweets  More motivated  utd eye and foot care F/u 6 mo

## 2016-11-25 NOTE — Assessment & Plan Note (Signed)
bp in fair control at this time  BP Readings from Last 1 Encounters:  11/24/16 135/60   No changes needed Disc lifstyle change with low sodium diet and exercise  Labs rev

## 2016-11-25 NOTE — Assessment & Plan Note (Signed)
Improving with PT  Can get on the table more easily and gait is more stable today

## 2016-11-25 NOTE — Assessment & Plan Note (Signed)
In PT and no falls since  Doing better

## 2016-11-25 NOTE — Assessment & Plan Note (Signed)
Improving with PT

## 2016-12-28 ENCOUNTER — Telehealth: Payer: Self-pay | Admitting: *Deleted

## 2016-12-28 NOTE — Telephone Encounter (Signed)
See prev note. Also the letters pt's daughter uploaded to her chart about this pt was printed and placed in Dr. Royden Purl inbox

## 2016-12-28 NOTE — Telephone Encounter (Signed)
Thank you!1

## 2016-12-28 NOTE — Telephone Encounter (Signed)
For her PT I used the codes R53.1 generalized weakness Also R29.6 falls frequently   I'm not sure why the insurance did not pay  Not sure who to send this to - will try Denny Peon and Shirlee Limerick to see if either person can tell me what to do next ?   Thanks

## 2016-12-28 NOTE — Telephone Encounter (Signed)
Pt's daughter put this note in her chart. Per Dr. Milinda Antis copied note to correct chart. :  Also have a question about my mom Amanda Benson... you had referred her for physical therapy.Bobbye Riggs it worked big time! She is doing a lot better at getting around. She just got a few letters in the mail saying that the insurance denied it and didn't cover it because the wrong code was entered or something. I've attached the letters in this email.  Thank you,  Amanda Benson

## 2016-12-28 NOTE — Telephone Encounter (Signed)
Called Rozanna Box with [facility] revenue cycle billing for Rehab Dept. She looked into this and said that [employer] had some billing issues the month of July and she saw where they will be reprocessing the claims for this patient. She gave me [phone] that I will share with the patient to check on this and they will tell her that it was a [employer] error that will be resolved. I will call the patient to let her know all of this.

## 2017-01-31 ENCOUNTER — Encounter: Payer: Self-pay | Admitting: Family Medicine

## 2017-01-31 ENCOUNTER — Ambulatory Visit (INDEPENDENT_AMBULATORY_CARE_PROVIDER_SITE_OTHER): Payer: PPO | Admitting: Family Medicine

## 2017-01-31 VITALS — BP 148/64 | HR 80 | Temp 98.9°F | Ht 63.0 in | Wt 152.0 lb

## 2017-01-31 DIAGNOSIS — R350 Frequency of micturition: Secondary | ICD-10-CM

## 2017-01-31 DIAGNOSIS — N3090 Cystitis, unspecified without hematuria: Secondary | ICD-10-CM | POA: Diagnosis not present

## 2017-01-31 LAB — POC URINALSYSI DIPSTICK (AUTOMATED)
BILIRUBIN UA: NEGATIVE
Glucose, UA: NEGATIVE
KETONES UA: NEGATIVE
Nitrite, UA: NEGATIVE
PH UA: 6 (ref 5.0–8.0)
Urobilinogen, UA: 0.2 E.U./dL

## 2017-01-31 MED ORDER — SULFAMETHOXAZOLE-TRIMETHOPRIM 400-80 MG PO TABS: 1.0000 | ORAL_TABLET | Freq: Two times a day (BID) | ORAL | 0 refills | Status: DC

## 2017-01-31 NOTE — Progress Notes (Signed)
Dr. Frederico Hamman T. Kalvyn Desa, MD, [facility] [place], [address] Phone: [phone] Fax: [phone]  01/31/2017  Patient: Amanda Benson, MRN: [id number], DOB: 1935-06-16, 81 y.o.  Primary Physician:  Tower, [name], MD   Chief Complaint  Patient presents with  . Urinary Frequency  . Abdominal Pain   Subjective:   This 81 y.o. female patient presents with burning, urgency. No vaginal discharge or external irritation.  No STD exposure. No abd pain, no flank pain.  The PMH, PSH, Social History, Family History, Medications, and allergies have been reviewed in [facility], and have been updated if relevant.  Patient Active Problem List   Diagnosis Date Noted  . Depression with anxiety 08/24/2016  . Falls frequently 08/24/2016  . Generalized weakness 08/24/2016  . Urine frequency 11/19/2015  . Routine general medical examination at a health care facility 05/16/2015  . Dysuria 05/10/2014  . Breast calcifications on mammogram 03/01/2014  . Encounter for Medicare annual wellness exam 05/30/2013  . Breast calcification seen on mammogram 12/14/2012  . Breast calcification, left 11/28/2012  . History of falling 11/27/2012  . Poor balance 11/27/2012  . Colon cancer screening 05/10/2012  . Osteoporosis 06/21/2007  . Diabetes type 2, controlled ([place]) 09/20/2006  . Hyperlipidemia associated with type 2 diabetes mellitus ([place]) 09/20/2006  . Essential hypertension 09/20/2006  . Osteoarthritis 09/20/2006    Past Medical History:  Diagnosis Date  . Depression   . Diabetes mellitus    type II  . Hyperlipidemia   . Hypertension   . Obesity   . Osteoporosis     Past Surgical History:  Procedure Laterality Date  . ANKLE SURGERY     left medial malleblus fracture  . CHOLECYSTECTOMY      Social History   Social History  . Marital status: Married    Spouse name: N/A  . Number of children: 7  . Years of education: N/A    Occupational History  .  Retired   Social History Main Topics  . Smoking status: Never Smoker  . Smokeless tobacco: Never Used  . Alcohol use No  . Drug use: No  . Sexual activity: Not on file   Other Topics Concern  . Not on file   Social History Narrative  . No narrative on file    Family History  Problem Relation Age of Onset  . Diabetes Mother   . Hypertension Mother   . Asthma Father   . Hypertension Sister   . Cancer Sister        lung  . Asthma Brother   . COPD Brother   . Cancer Maternal Grandmother        breast   . Asthma Brother     Allergies  Allergen Reactions  . Alendronate Sodium     REACTION: heartburn    Medication list reviewed and updated in full in [facility].  GEN:  no fevers, chills. GI: No n/v/d, eating normally Otherwise, ROS is as per the HPI.  Objective:   Blood pressure (!) 148/64, pulse 80, temperature 98.9 F (37.2 C), temperature source Oral, height '5\' 3"'$  (1.6 m), weight 152 lb (68.9 kg).  GEN: WDWN, A&Ox4,NAD. Non-toxic HEENT: Atraumatc, normocephalic. CV: RRR, No M/G/R PULM: CTA B, No wheezes, crackles, or rhonchi ABD: S, NT, ND, +BS, no rebound. No CVAT. No suprapubic tenderness. EXT: No c/c/e  Objective Data: Results for orders placed or performed in visit on 01/31/17  POCT Urinalysis Dipstick (Automated)  Result Value Ref Range   Color, UA yellow    Clarity, UA cloudy    Glucose, UA negative    Bilirubin, UA negative    Ketones, UA negative    Spec Grav, UA >=1.030 (A) 1.010 - 1.025   Blood, UA small    pH, UA 6.0 5.0 - 8.0   Protein, UA 1+    Urobilinogen, UA 0.2 0.2 or 1.0 E.U./dL   Nitrite, UA negative    Leukocytes, UA Large (3+) (A) Negative    Assessment and Plan:   Cystitis  Urinary frequency - Plan: POCT Urinalysis Dipstick (Automated), Urine Culture  Rx with ABX as below. Drink plenty of fluids and supportive care.  Follow-up: No Follow-up on file.  New Prescriptions    SULFAMETHOXAZOLE-TRIMETHOPRIM (BACTRIM,SEPTRA) 400-80 MG TABLET    Take 1 tablet by mouth 2 (two) times daily.   Orders Placed This Encounter  Procedures  . Urine Culture  . POCT Urinalysis Dipstick (Automated)    Signed,  Eligha Kmetz T. Jayvion Stefanski, MD   Patient's Medications  New Prescriptions   SULFAMETHOXAZOLE-TRIMETHOPRIM (BACTRIM,SEPTRA) 400-80 MG TABLET    Take 1 tablet by mouth 2 (two) times daily.  Previous Medications   AMLODIPINE (NORVASC) 10 MG TABLET    Take 1 tablet (10 mg total) by mouth daily.   ASPIRIN 81 MG EC TABLET    Take 81 mg by mouth daily.     BLOOD GLUCOSE MONITORING SUPPL (ONE TOUCH ULTRA SYSTEM KIT) W/DEVICE KIT    To check sugar daily and as needed for DM2 250.0    BUPROPION (WELLBUTRIN XL) 300 MG 24 HR TABLET    Take 1 tablet (300 mg total) by mouth daily.   FLUOXETINE (PROZAC) 40 MG CAPSULE    Take 1 capsule (40 mg total) by mouth daily.   GLIPIZIDE (GLUCOTROL XL) 10 MG 24 HR TABLET    Take 1 tablet (10 mg total) by mouth daily.   LOSARTAN-HYDROCHLOROTHIAZIDE (HYZAAR) 100-25 MG TABLET    Take 1 tablet by mouth daily.   METFORMIN (GLUCOPHAGE) 1000 MG TABLET    Take 1 tablet (1,000 mg total) by mouth 2 (two) times daily with a meal.   RALOXIFENE (EVISTA) 60 MG TABLET    Take 1 tablet (60 mg total) by mouth daily.   SIMVASTATIN (ZOCOR) 20 MG TABLET    Take 1 tablet (20 mg total) by mouth daily.  Modified Medications   No medications on file  Discontinued Medications   No medications on file

## 2017-02-03 LAB — URINE CULTURE
MICRO NUMBER: 81209344
SPECIMEN QUALITY: ADEQUATE

## 2017-02-14 ENCOUNTER — Other Ambulatory Visit: Payer: Self-pay | Admitting: Family Medicine

## 2017-02-25 ENCOUNTER — Other Ambulatory Visit: Payer: Self-pay | Admitting: Family Medicine

## 2017-04-19 ENCOUNTER — Other Ambulatory Visit: Payer: Self-pay | Admitting: Family Medicine

## 2017-04-22 ENCOUNTER — Other Ambulatory Visit: Payer: Self-pay | Admitting: Family Medicine

## 2017-05-12 ENCOUNTER — Other Ambulatory Visit: Payer: Self-pay | Admitting: Family Medicine

## 2017-05-12 MED ORDER — METFORMIN HCL 1000 MG PO TABS: 1000.0000 mg | ORAL_TABLET | Freq: Two times a day (BID) | ORAL | 0 refills | Status: DC

## 2017-05-12 NOTE — Addendum Note (Signed)
Addended by: Desmond DikeKNIGHT, Kristene Liberati H on: 05/12/2017 12:44 PM   Modules accepted: Orders

## 2017-05-12 NOTE — Telephone Encounter (Signed)
Copied from CRM [id number]. Topic: Quick Communication - See Telephone Encounter >> May 12, 2017 12:24 PM Joana ReamerWarren, Amber N wrote: CRM for notification. See Telephone encounter for:  Amanda Benson with [facility] on [address] in [place] is requesting a refill on the metFORMIN (GLUCOPHAGE) 1000 MG. Contact number is [phone]. Please advise.  05/12/17.

## 2017-05-12 NOTE — Telephone Encounter (Signed)
Metformin   refill Last OV:  11/24/16  And 01/31/17 Last Refill: 02/23/17  [facility] [address]

## 2017-05-23 ENCOUNTER — Telehealth: Payer: Self-pay | Admitting: Family Medicine

## 2017-05-23 MED ORDER — GLUCOSE BLOOD VI STRP: ORAL_STRIP | 1 refills | Status: DC

## 2017-05-23 NOTE — Telephone Encounter (Signed)
One Touch Test Strips refill Last OV: 01/31/17 Last Refill:12/23/10 Pharmacy:[facility] [address]

## 2017-05-23 NOTE — Telephone Encounter (Signed)
Rx sent 

## 2017-05-23 NOTE — Telephone Encounter (Signed)
Copied from CRM [phone]. Topic: General - Other >> May 23, 2017 11:06 AM Cecelia ByarsGreen, Katti Pelle L, RMA wrote: Reason for CRM: Medication refill [facility] for one touch test [facility] [address]

## 2017-05-30 ENCOUNTER — Other Ambulatory Visit (INDEPENDENT_AMBULATORY_CARE_PROVIDER_SITE_OTHER): Payer: PPO

## 2017-05-30 DIAGNOSIS — E119 Type 2 diabetes mellitus without complications: Secondary | ICD-10-CM

## 2017-05-30 DIAGNOSIS — I10 Essential (primary) hypertension: Secondary | ICD-10-CM | POA: Diagnosis not present

## 2017-05-30 LAB — COMPREHENSIVE METABOLIC PANEL
ALK PHOS: 52 U/L (ref 39–117)
ALT: 12 U/L (ref 0–35)
AST: 15 U/L (ref 0–37)
Albumin: 3.4 g/dL — ABNORMAL LOW (ref 3.5–5.2)
BUN: 22 mg/dL (ref 6–23)
CALCIUM: 9.7 mg/dL (ref 8.4–10.5)
CO2: 32 meq/L (ref 19–32)
CREATININE: 1.03 mg/dL (ref 0.40–1.20)
Chloride: 103 mEq/L (ref 96–112)
GFR: 54.81 mL/min — AB (ref >60.00)
GLUCOSE: 144 mg/dL — AB (ref 70–99)
Potassium: 3.9 mEq/L (ref 3.5–5.1)
Sodium: 144 mEq/L (ref 135–145)
TOTAL PROTEIN: 6.4 g/dL (ref 6.0–8.3)
Total Bilirubin: 0.5 mg/dL (ref 0.2–1.2)

## 2017-05-30 LAB — LIPID PANEL
CHOL/HDL RATIO: 3
Cholesterol: 131 mg/dL (ref 0–200)
HDL: 50.1 mg/dL (ref >39.00)
LDL CALC: 54 mg/dL (ref 0–99)
NonHDL: 81.22
TRIGLYCERIDES: 136 mg/dL (ref 0.0–149.0)
VLDL: 27.2 mg/dL (ref 0.0–40.0)

## 2017-05-30 LAB — HEMOGLOBIN A1C: HEMOGLOBIN A1C: 7.1 % — AB (ref 4.6–6.5)

## 2017-06-01 ENCOUNTER — Ambulatory Visit: Payer: PPO | Admitting: Family Medicine

## 2017-06-08 ENCOUNTER — Encounter: Payer: Self-pay | Admitting: Family Medicine

## 2017-06-08 ENCOUNTER — Ambulatory Visit (INDEPENDENT_AMBULATORY_CARE_PROVIDER_SITE_OTHER): Payer: PPO | Admitting: Family Medicine

## 2017-06-08 VITALS — BP 135/60 | HR 77 | Temp 98.4°F | Ht 63.0 in | Wt 153.5 lb

## 2017-06-08 DIAGNOSIS — E785 Hyperlipidemia, unspecified: Secondary | ICD-10-CM

## 2017-06-08 DIAGNOSIS — Z23 Encounter for immunization: Secondary | ICD-10-CM | POA: Diagnosis not present

## 2017-06-08 DIAGNOSIS — R296 Repeated falls: Secondary | ICD-10-CM

## 2017-06-08 DIAGNOSIS — I10 Essential (primary) hypertension: Secondary | ICD-10-CM

## 2017-06-08 DIAGNOSIS — E1169 Type 2 diabetes mellitus with other specified complication: Secondary | ICD-10-CM | POA: Diagnosis not present

## 2017-06-08 DIAGNOSIS — E119 Type 2 diabetes mellitus without complications: Secondary | ICD-10-CM

## 2017-06-08 NOTE — Patient Instructions (Addendum)
Get back to your physical therapy exercises  To prevent falls and keep your healthy   Keep a blood sugar and diet log when you can  Gravitate towards the afternoon /evening checks - these seem to be more labile   Please set up your eye exam

## 2017-06-08 NOTE — Progress Notes (Signed)
Subjective:    Patient ID: Amanda Benson, female    DOB: 30-Nov-1935, 82 y.o.   MRN: [id number]  HPI Here for f/u of chronic health problems   Not doing a lot lately  Feeling ok overall   Flu shot - will get today   Wt Readings from Last 3 Encounters:  06/08/17 153 lb 8 oz (69.6 kg)  01/31/17 152 lb (68.9 kg)  11/24/16 150 lb 12 oz (68.4 kg)  diet is about the same / watching portions  Was getting exercise - then stopped (her PT)- needs to start back on her own  27.19 kg/m    bp is stable today (always higher on the first check)  No cp or palpitations or headaches or edema  No side effects to medicines  BP Readings from Last 3 Encounters:  06/08/17 (!) 146/64  01/31/17 (!) 148/64  11/24/16 135/60  better bp in 2nd check today  BP: 135/60     Lab Results  Component Value Date   CREATININE 1.03 05/30/2017   BUN 22 05/30/2017   NA 144 05/30/2017   K 3.9 05/30/2017   CL 103 05/30/2017   CO2 32 05/30/2017   Lab Results  Component Value Date   ALT 12 05/30/2017   AST 15 05/30/2017   ALKPHOS 52 05/30/2017   BILITOT 0.5 05/30/2017     DM2 Lab Results  Component Value Date   HGBA1C 7.1 (H) 05/30/2017  not doing her exercise  Diet is the same (? Sneaks sugar)  This is up from 6.8  Am flucose 120s-140s mostly  Pp more labile - from 130s to upper 200s   On metformin and glipizide   Eye exam 10/17 ARB for renal protection  Exercise -none   Hyperlipidemia Lab Results  Component Value Date   CHOL 131 05/30/2017   CHOL 123 08/17/2016   CHOL 121 02/18/2016   Lab Results  Component Value Date   HDL 50.10 05/30/2017   HDL 53.60 08/17/2016   HDL 51.80 02/18/2016   Lab Results  Component Value Date   LDLCALC 54 05/30/2017   LDLCALC 42 08/17/2016   LDLCALC 48 02/18/2016   Lab Results  Component Value Date   TRIG 136.0 05/30/2017   TRIG 137.0 08/17/2016   TRIG 109.0 02/18/2016   Lab Results  Component Value Date   CHOLHDL 3 05/30/2017   CHOLHDL 2 08/17/2016   CHOLHDL 2 02/18/2016   Lab Results  Component Value Date   LDLDIRECT 107.2 09/19/2009    Simvastatin and diet  Stable/well controlled   Patient Active Problem List   Diagnosis Date Noted  . Depression with anxiety 08/24/2016  . Falls frequently 08/24/2016  . Generalized weakness 08/24/2016  . Urine frequency 11/19/2015  . Routine general medical examination at a health care facility 05/16/2015  . Dysuria 05/10/2014  . Breast calcifications on mammogram 03/01/2014  . Encounter for Medicare annual wellness exam 05/30/2013  . Breast calcification seen on mammogram 12/14/2012  . Breast calcification, left 11/28/2012  . History of falling 11/27/2012  . Poor balance 11/27/2012  . Colon cancer screening 05/10/2012  . Osteoporosis 06/21/2007  . Diabetes type 2, controlled ([place]) 09/20/2006  . Hyperlipidemia associated with type 2 diabetes mellitus ([place]) 09/20/2006  . Essential hypertension 09/20/2006  . Osteoarthritis 09/20/2006   Past Medical History:  Diagnosis Date  . Depression   . Diabetes mellitus    type II  . Hyperlipidemia   . Hypertension   . Obesity   .  Osteoporosis    Past Surgical History:  Procedure Laterality Date  . ANKLE SURGERY     left medial malleblus fracture  . CHOLECYSTECTOMY     Social History   Tobacco Use  . Smoking status: Never Smoker  . Smokeless tobacco: Never Used  Substance Use Topics  . Alcohol use: No    Alcohol/week: 0.0 oz  . Drug use: No   Family History  Problem Relation Age of Onset  . Diabetes Mother   . Hypertension Mother   . Asthma Father   . Hypertension Sister   . Cancer Sister        lung  . Asthma Brother   . COPD Brother   . Cancer Maternal Grandmother        breast   . Asthma Brother    Allergies  Allergen Reactions  . Alendronate Sodium     REACTION: heartburn   Current Outpatient Medications on File Prior to Visit  Medication Sig Dispense Refill  . amLODipine (NORVASC) 10 MG  tablet take 1 tablet by mouth once daily 90 tablet 1  . aspirin 81 MG EC tablet Take 81 mg by mouth daily.      . Blood Glucose Monitoring Suppl (ONE TOUCH ULTRA SYSTEM KIT) W/DEVICE KIT To check sugar daily and as needed for DM2 250.0  1 each 0  . buPROPion (WELLBUTRIN XL) 300 MG 24 hr tablet take 1 tablet by mouth once daily 90 tablet 1  . FLUoxetine (PROZAC) 40 MG capsule take 1 capsule by mouth once daily 90 capsule 1  . glipiZIDE (GLUCOTROL XL) 10 MG 24 hr tablet take 1 tablet by mouth once daily 90 tablet 1  . glucose blood (ONE TOUCH ULTRA TEST) test strip Use to check blood sugar once daily (Dx. E11.9) 100 each 1  . losartan-hydrochlorothiazide (HYZAAR) 100-25 MG tablet Take 1 tablet by mouth daily. 30 tablet 11  . metFORMIN (GLUCOPHAGE) 1000 MG tablet Take 1 tablet (1,000 mg total) by mouth 2 (two) times daily with a meal. 180 tablet 0  . raloxifene (EVISTA) 60 MG tablet Take 1 tablet (60 mg total) by mouth daily. 30 tablet 11  . simvastatin (ZOCOR) 20 MG tablet take 1 tablet by mouth once daily 90 tablet 1   No current facility-administered medications on file prior to visit.     Review of Systems  Constitutional: Positive for fatigue. Negative for activity change, appetite change, fever and unexpected weight change.  HENT: Negative for congestion, ear pain, rhinorrhea, sinus pressure and sore throat.   Eyes: Negative for pain, redness and visual disturbance.  Respiratory: Negative for cough, shortness of breath and wheezing.   Cardiovascular: Negative for chest pain and palpitations.  Gastrointestinal: Negative for abdominal pain, blood in stool, constipation and diarrhea.  Endocrine: Negative for polydipsia and polyuria.  Genitourinary: Negative for dysuria, frequency and urgency.  Musculoskeletal: Positive for back pain. Negative for myalgias.  Skin: Negative for pallor and rash.  Allergic/Immunologic: Negative for environmental allergies.  Neurological: Negative for  dizziness, syncope and headaches.  Hematological: Negative for adenopathy. Does not bruise/bleed easily.  Psychiatric/Behavioral: Negative for decreased concentration and dysphoric mood. The patient is not nervous/anxious.        Objective:   Physical Exam  Constitutional: She appears well-developed and well-nourished. No distress.  overwt and well app  HENT:  Head: Normocephalic and atraumatic.  Mouth/Throat: Oropharynx is clear and moist.  Eyes: Conjunctivae and EOM are normal. Pupils are equal, round, and reactive to light.  Neck: Normal range of motion. Neck supple. No JVD present. Carotid bruit is not present. No thyromegaly present.  Cardiovascular: Normal rate, regular rhythm, normal heart sounds and intact distal pulses. Exam reveals no gallop.  Pulmonary/Chest: Effort normal and breath sounds normal. No respiratory distress. She has no wheezes. She has no rales.  No crackles  Abdominal: Soft. Bowel sounds are normal. She exhibits no distension, no abdominal bruit and no mass. There is no tenderness.  Musculoskeletal: She exhibits no edema.  Limited rom of spine/hips/knees  Lymphadenopathy:    She has no cervical adenopathy.  Neurological: She is alert. She has normal reflexes. No cranial nerve deficit. She exhibits normal muscle tone. Coordination normal.  Skin: Skin is warm and dry. No rash noted. No pallor.  Psychiatric: She has a normal mood and affect.  Affect is brighter today          Assessment & Plan:   Problem List Items Addressed This Visit      Cardiovascular and Mediastinum   Essential hypertension    bp in fair control at this time  BP Readings from Last 1 Encounters:  06/08/17 135/60   No changes needed Disc lifstyle change with low sodium diet and exercise  Better on 2nd check as usual        Endocrine   Diabetes type 2, controlled ([place]) - Primary    Lab Results  Component Value Date   HGBA1C 7.1 (H) 05/30/2017   This is up -maybe due to  less exercise Disc plan to start doing PT exercises daily and also walk when able  No change in medicines Daughter will make her eye doctor appt Continue low glycemic diet       Hyperlipidemia associated with type 2 diabetes mellitus ([place])    Disc goals for lipids and reasons to control them Rev labs with pt Rev low sat fat diet in detail Well controlled with statin         Other   Falls frequently    Enc her to get back to her PT exercises every day Continue walker at all times for ambulation        Other Visit Diagnoses    Need for influenza vaccination       Relevant Orders   Flu Vaccine QUAD 6+ mos PF IM (Fluarix Quad PF) (Completed)

## 2017-06-09 NOTE — Assessment & Plan Note (Signed)
Lab Results  Component Value Date   HGBA1C 7.1 (H) 05/30/2017   This is up -maybe due to less exercise Disc plan to start doing PT exercises daily and also walk when able  No change in medicines Daughter will make her eye doctor appt Continue low glycemic diet

## 2017-06-09 NOTE — Assessment & Plan Note (Signed)
Disc goals for lipids and reasons to control them Rev labs with pt Rev low sat fat diet in detail Well controlled with statin

## 2017-06-09 NOTE — Assessment & Plan Note (Signed)
bp in fair control at this time  BP Readings from Last 1 Encounters:  06/08/17 135/60   No changes needed Disc lifstyle change with low sodium diet and exercise  Better on 2nd check as usual

## 2017-06-09 NOTE — Assessment & Plan Note (Signed)
Enc her to get back to her PT exercises every day Continue walker at all times for ambulation

## 2017-08-11 ENCOUNTER — Other Ambulatory Visit: Payer: Self-pay | Admitting: Family Medicine

## 2017-08-17 ENCOUNTER — Other Ambulatory Visit: Payer: Self-pay | Admitting: Family Medicine

## 2017-08-18 ENCOUNTER — Other Ambulatory Visit: Payer: Self-pay | Admitting: Family Medicine

## 2017-08-21 ENCOUNTER — Telehealth: Payer: Self-pay | Admitting: Family Medicine

## 2017-08-21 DIAGNOSIS — E1169 Type 2 diabetes mellitus with other specified complication: Secondary | ICD-10-CM

## 2017-08-21 DIAGNOSIS — M810 Age-related osteoporosis without current pathological fracture: Secondary | ICD-10-CM

## 2017-08-21 DIAGNOSIS — E119 Type 2 diabetes mellitus without complications: Secondary | ICD-10-CM

## 2017-08-21 DIAGNOSIS — E785 Hyperlipidemia, unspecified: Secondary | ICD-10-CM

## 2017-08-21 DIAGNOSIS — I10 Essential (primary) hypertension: Secondary | ICD-10-CM

## 2017-08-21 NOTE — Telephone Encounter (Signed)
-----   Message from Robert Bellow, LPN sent at 07/12/8117  2:32 PM EDT ----- Regarding: Labs 5/20 Lab orders needed. Thank you.  Insurance:  [employer]  FYI: Some labs done in Feb 19.

## 2017-08-22 ENCOUNTER — Ambulatory Visit (INDEPENDENT_AMBULATORY_CARE_PROVIDER_SITE_OTHER): Payer: PPO

## 2017-08-22 VITALS — BP 118/62 | HR 70 | Temp 99.2°F | Ht 62.5 in | Wt 153.2 lb

## 2017-08-22 DIAGNOSIS — M810 Age-related osteoporosis without current pathological fracture: Secondary | ICD-10-CM

## 2017-08-22 DIAGNOSIS — I10 Essential (primary) hypertension: Secondary | ICD-10-CM

## 2017-08-22 DIAGNOSIS — E119 Type 2 diabetes mellitus without complications: Secondary | ICD-10-CM | POA: Diagnosis not present

## 2017-08-22 DIAGNOSIS — Z0000 Encounter for general adult medical examination without abnormal findings: Secondary | ICD-10-CM

## 2017-08-22 LAB — HEMOGLOBIN A1C: Hgb A1c MFr Bld: 7.1 % — ABNORMAL HIGH (ref 4.6–6.5)

## 2017-08-22 LAB — VITAMIN D 25 HYDROXY (VIT D DEFICIENCY, FRACTURES): VITD: 75.52 ng/mL (ref 30.00–100.00)

## 2017-08-22 LAB — COMPREHENSIVE METABOLIC PANEL
ALK PHOS: 47 U/L (ref 39–117)
ALT: 12 U/L (ref 0–35)
AST: 14 U/L (ref 0–37)
Albumin: 3.3 g/dL — ABNORMAL LOW (ref 3.5–5.2)
BUN: 23 mg/dL (ref 6–23)
CALCIUM: 9.4 mg/dL (ref 8.4–10.5)
CO2: 33 meq/L — AB (ref 19–32)
Chloride: 99 mEq/L (ref 96–112)
Creatinine, Ser: 1.07 mg/dL (ref 0.40–1.20)
GFR: 52.43 mL/min — AB (ref >60.00)
GLUCOSE: 257 mg/dL — AB (ref 70–99)
Potassium: 3.6 mEq/L (ref 3.5–5.1)
Sodium: 143 mEq/L (ref 135–145)
TOTAL PROTEIN: 6.2 g/dL (ref 6.0–8.3)
Total Bilirubin: 0.5 mg/dL (ref 0.2–1.2)

## 2017-08-22 LAB — TSH: TSH: 5.62 u[IU]/mL — AB (ref 0.35–4.50)

## 2017-08-22 NOTE — Progress Notes (Signed)
Subjective:   METHA KOLASA is a 82 y.o. female who presents for Medicare Annual (Subsequent) preventive examination.  Review of Systems:  N/A Cardiac Risk Factors include: advanced age (>31mn, >>29women);diabetes mellitus;dyslipidemia;hypertension     Objective:     Vitals: BP 118/62 (BP Location: Right Arm, Patient Position: Sitting, Cuff Size: Normal)   Pulse 70   Temp 99.2 F (37.3 C) (Oral)   Ht 5' 2.5" (1.588 m) Comment: shoes  Wt 153 lb 4 oz (69.5 kg)   SpO2 97%   BMI 27.58 kg/m   Body mass index is 27.58 kg/m.  Advanced Directives 08/22/2017 08/25/2016 08/17/2016  Does Patient Have a Medical Advance Directive? No No No  Does patient want to make changes to medical advance directive? Yes (MAU/Ambulatory/Procedural Areas - Information given) (No Data) -    Tobacco Social History   Tobacco Use  Smoking Status Never Smoker  Smokeless Tobacco Never Used     Counseling given: No   Clinical Intake:  Pre-visit preparation completed: Yes  Pain : No/denies pain Pain Score: 0-No pain     Nutritional Status: BMI 25 -29 Overweight Nutritional Risks: None Diabetes: No  How often do you need to have someone help you when you read instructions, pamphlets, or other written materials from your doctor or pharmacy?: 2 - Rarely What is the last grade level you completed in school?: 10th grade  Interpreter Needed?: No     Past Medical History:  Diagnosis Date  . Depression   . Diabetes mellitus    type II  . Hyperlipidemia   . Hypertension   . Obesity   . Osteoporosis    Past Surgical History:  Procedure Laterality Date  . ANKLE SURGERY     left medial malleblus fracture  . CHOLECYSTECTOMY     Family History  Problem Relation Age of Onset  . Diabetes Mother   . Hypertension Mother   . Asthma Father   . Hypertension Sister   . Cancer Sister        lung  . Asthma Brother   . COPD Brother   . Cancer Maternal Grandmother        breast   . Asthma  Brother    Social History   Socioeconomic History  . Marital status: Married    Spouse name: Not on file  . Number of children: 7  . Years of education: Not on file  . Highest education level: Not on file  Occupational History    Employer: RETIRED  Social Needs  . Financial resource strain: Not on file  . Food insecurity:    Worry: Not on file    Inability: Not on file  . Transportation needs:    Medical: Not on file    Non-medical: Not on file  Tobacco Use  . Smoking status: Never Smoker  . Smokeless tobacco: Never Used  Substance and Sexual Activity  . Alcohol use: No    Alcohol/week: 0.0 oz  . Drug use: No  . Sexual activity: Not on file  Lifestyle  . Physical activity:    Days per week: Not on file    Minutes per session: Not on file  . Stress: Not on file  Relationships  . Social connections:    Talks on phone: Not on file    Gets together: Not on file    Attends religious service: Not on file    Active member of club or organization: Not on file  Attends meetings of clubs or organizations: Not on file    Relationship status: Not on file  Other Topics Concern  . Not on file  Social History Narrative  . Not on file    Outpatient Encounter Medications as of 08/22/2017  Medication Sig  . amLODipine (NORVASC) 10 MG tablet take 1 tablet by mouth once daily  . aspirin 81 MG EC tablet Take 81 mg by mouth daily.    . Blood Glucose Monitoring Suppl (ONE TOUCH ULTRA SYSTEM KIT) W/DEVICE KIT To check sugar daily and as needed for DM2 250.0   . buPROPion (WELLBUTRIN XL) 300 MG 24 hr tablet take 1 tablet by mouth once daily  . FLUoxetine (PROZAC) 40 MG capsule take 1 capsule by mouth once daily  . glipiZIDE (GLUCOTROL XL) 10 MG 24 hr tablet take 1 tablet by mouth once daily  . glucose blood (ONE TOUCH ULTRA TEST) test strip Use to check blood sugar once daily (Dx. E11.9)  . losartan-hydrochlorothiazide (HYZAAR) 100-25 MG tablet TAKE 1 TABLET BY MOUTH ONCE DAILY  .  metFORMIN (GLUCOPHAGE) 1000 MG tablet TAKE 1 TABLET(1000 MG) BY MOUTH TWICE DAILY WITH A MEAL  . raloxifene (EVISTA) 60 MG tablet TAKE 1 TABLET BY MOUTH ONCE DAILY  . simvastatin (ZOCOR) 20 MG tablet TAKE 1 TABLET BY MOUTH ONCE DAILY   No facility-administered encounter medications on file as of 08/22/2017.     Activities of Daily Living In your present state of health, do you have any difficulty performing the following activities: 08/22/2017  Hearing? Y  Vision? Y  Difficulty concentrating or making decisions? Y  Walking or climbing stairs? N  Dressing or bathing? N  Doing errands, shopping? Y  Preparing Food and eating ? Y  Using the Toilet? N  In the past six months, have you accidently leaked urine? Y  Do you have problems with loss of bowel control? N  Managing your Medications? Y  Managing your Finances? Y  Housekeeping or managing your Housekeeping? Y  Some recent data might be hidden    Patient Care Team: Tower, [name], MD as PCP - General    Assessment:   This is a routine wellness examination for [place].   Visual Acuity Screening   Right eye Left eye Both eyes  Without correction:     With correction: '20/70 20/70 20/50 '$  Hearing Screening Comments: Bilateral hearing aids   Exercise Activities and Dietary recommendations Current Exercise Habits: The patient does not participate in regular exercise at present, Exercise limited by: None identified  Goals    . Patient Stated     Starting 08/22/2017, I will continue to take medications as prescribed.         Fall Risk Fall Risk  08/22/2017 08/17/2016 05/16/2015 05/30/2013 05/11/2012  Falls in the past year? Yes Yes No Yes Yes  Comment lost balance and fell; denies injury 4 falls per daughter; no medical treatment - - -  Number falls in past yr: 1 2 or more - 2 or more 1  Injury with Fall? No No - - -  Risk Factor Category  - - - High Fall Risk -  Risk for fall due to : - Impaired balance/gait;Impaired mobility -  - Impaired balance/gait   Depression Screen PHQ 2/9 Scores 08/22/2017 08/17/2016 05/16/2015 05/30/2013  PHQ - 2 Score 2 1 0 0  PHQ- 9 Score 11 - - -     Cognitive Function MMSE - Mini Mental State Exam 08/22/2017 08/17/2016  Orientation to time 5 5  Orientation to Place 5 5  Registration 3 3  Attention/ Calculation 0 0  Recall 3 3  Language- name 2 objects 0 0  Language- repeat 1 1  Language- follow 3 step command 3 3  Language- read & follow direction 0 0  Write a sentence 0 0  Copy design 0 0  Total score 20 20     PLEASE NOTE: A Mini-Cog screen was completed. Maximum score is 20. A value of 0 denotes this part of [place] MMSE was not completed or the patient failed this part of the Mini-Cog screening.   Mini-Cog Screening Orientation to Time - Max 5 pts Orientation to Place - Max 5 pts Registration - Max 3 pts Recall - Max 3 pts Language Repeat - Max 1 pts Language Follow 3 Step Command - Max 3 pts     Immunization History  Administered Date(s) Administered  . Influenza Split 02/18/2011  . Influenza Whole 01/12/2002, 04/08/2009, 01/05/2010  . Influenza,inj,Quad PF,6+ Mos 05/10/2014, 02/12/2015, 02/24/2016, 06/08/2017  . Pneumococcal Conjugate-13 02/12/2015  . Pneumococcal Polysaccharide-23 11/26/2011  . Td 10/30/2002, 05/10/2012    Screening Tests Health Maintenance  Topic Date Due  . OPHTHALMOLOGY EXAM  06/10/2018 (Originally 01/12/2017)  . MAMMOGRAM  05/07/2020 (Originally 03/22/2015)  . INFLUENZA VACCINE  11/03/2017  . FOOT EXAM  11/24/2017  . HEMOGLOBIN A1C  02/22/2018  . TETANUS/TDAP  05/10/2022  . DEXA SCAN  Completed  . PNA vac Low Risk Adult  Completed     Plan:     I have personally reviewed, addressed, and noted the following in the patient's chart:  A. Medical and social history B. Use of alcohol, tobacco or illicit drugs  C. Current medications and supplements D. Functional ability and status E.  Nutritional status F.  Physical  activity G. Advance directives H. List of other physicians I.  Hospitalizations, surgeries, and ER visits in previous 12 months J.  [place] to include hearing, vision, cognitive, depression L. Referrals and appointments - none  In addition, I have reviewed and discussed with patient certain preventive protocols, quality metrics, and best practice recommendations. A written personalized care plan for preventive services as well as general preventive health recommendations were provided to patient.  See attached scanned questionnaire for additional information.   Signed,   Lindell Noe, MHA, BS, LPN Health Coach

## 2017-08-22 NOTE — Progress Notes (Signed)
PCP notes:   Health maintenance:  A1C - completed  Abnormal screenings:    Fall risk - hx of single fall Fall Risk  08/22/2017 08/17/2016 05/16/2015 05/30/2013 05/11/2012  Falls in the past year? Yes Yes No Yes Yes  Comment lost balance and fell; denies injury 4 falls per daughter; no medical treatment - - -  Number falls in past yr: 1 2 or more - 2 or more 1  Injury with Fall? No No - - -  Risk Factor Category  - - - High Fall Risk -  Risk for fall due to : - Impaired balance/gait;Impaired mobility - - Impaired balance/gait   Depression score: 11 Depression screen [employer] 2/9 08/22/2017 08/17/2016 05/16/2015 05/30/2013 05/11/2012  Decreased Interest 1 0 0 0 0  Down, Depressed, Hopeless 1 1 0 0 0  PHQ - 2 Score 2 1 0 0 0  Altered sleeping 2 - - - -  Tired, decreased energy 1 - - - -  Change in appetite 2 - - - -  Feeling bad or failure about yourself  2 - - - -  Trouble concentrating 1 - - - -  Moving slowly or fidgety/restless 1 - - - -  Suicidal thoughts 0 - - - -  PHQ-9 Score 11 - - - -  Difficult doing work/chores Somewhat difficult - - - -   Patient concerns:   None  Nurse concerns:  None  Next PCP appt:   09/05/17 @ 1130  I reviewed health advisor's note, was available for consultation, and agree with documentation and plan. Roxy Manns MD

## 2017-08-22 NOTE — Patient Instructions (Signed)
Amanda Benson , Thank you for taking time to come for your Medicare Wellness Visit. I appreciate your ongoing commitment to your health goals. Please review the following plan we discussed and let me know if I can assist you in the future.   These are the goals we discussed: Goals    . Patient Stated     Starting 08/22/2017, I will continue to take medications as prescribed.         This is a list of the screening recommended for you and due dates:  Health Maintenance  Topic Date Due  . Eye exam for diabetics  06/10/2018*  . Mammogram  05/07/2020*  . Flu Shot  11/03/2017  . Complete foot exam   11/24/2017  . Hemoglobin A1C  02/22/2018  . Tetanus Vaccine  05/10/2022  . DEXA scan (bone density measurement)  Completed  . Pneumonia vaccines  Completed  *Topic was postponed. The date shown is not the original due date.   Preventive Care for Adults  A healthy lifestyle and preventive care can promote health and wellness. Preventive health guidelines for adults include the following key practices.  . A routine yearly physical is a good way to check with your health care provider about your health and preventive screening. It is a chance to share any concerns and updates on your health and to receive a thorough exam.  . Visit your dentist for a routine exam and preventive care every 6 months. Brush your teeth twice a day and floss once a day. Good oral hygiene prevents tooth decay and gum disease.  . The frequency of eye exams is based on your age, health, family medical history, use  of contact lenses, and other factors. Follow your health care provider's recommendations for frequency of eye exams.  . Eat a healthy diet. Foods like vegetables, fruits, whole grains, low-fat dairy products, and lean protein foods contain the nutrients you need without too many calories. Decrease your intake of foods high in solid fats, added sugars, and salt. Eat the right amount of calories for you. Get  information about a proper diet from your health care provider, if necessary.  . Regular physical exercise is one of the most important things you can do for your health. Most adults should get at least 150 minutes of moderate-intensity exercise (any activity that increases your heart rate and causes you to sweat) each week. In addition, most adults need muscle-strengthening exercises on 2 or more days a week.  Silver Sneakers may be a benefit available to you. To determine eligibility, you may visit the website: www.silversneakers.com or contact program at (618)320-4626 Mon-Fri between 8AM-8PM.   . Maintain a healthy weight. The body mass index (BMI) is a screening tool to identify possible weight problems. It provides an estimate of body fat based on height and weight. Your health care provider can find your BMI and can help you achieve or maintain a healthy weight.   For adults 20 years and older: ? A BMI below 18.5 is considered underweight. ? A BMI of 18.5 to 24.9 is normal. ? A BMI of 25 to 29.9 is considered overweight. ? A BMI of 30 and above is considered obese.   . Maintain normal blood lipids and cholesterol levels by exercising and minimizing your intake of saturated fat. Eat a balanced diet with plenty of fruit and vegetables. Blood tests for lipids and cholesterol should begin at age 19 and be repeated every 5 years. If your lipid or  cholesterol levels are high, you are over 50, or you are at high risk for heart disease, you may need your cholesterol levels checked more frequently. Ongoing high lipid and cholesterol levels should be treated with medicines if diet and exercise are not working.  . If you smoke, find out from your health care provider how to quit. If you do not use tobacco, please do not start.  . If you choose to drink alcohol, please do not consume more than 2 drinks per day. One drink is considered to be 12 ounces (355 mL) of beer, 5 ounces (148 mL) of wine, or 1.5  ounces (44 mL) of liquor.  . If you are 3-68 years old, ask your health care provider if you should take aspirin to prevent strokes.  . Use sunscreen. Apply sunscreen liberally and repeatedly throughout the day. You should seek shade when your shadow is shorter than you. Protect yourself by wearing long sleeves, pants, a wide-brimmed hat, and sunglasses year round, whenever you are outdoors.  . Once a month, do a whole body skin exam, using a mirror to look at the skin on your back. Tell your health care provider of new moles, moles that have irregular borders, moles that are larger than a pencil eraser, or moles that have changed in shape or color.

## 2017-09-04 ENCOUNTER — Other Ambulatory Visit: Payer: Self-pay | Admitting: Family Medicine

## 2017-09-05 ENCOUNTER — Encounter: Payer: PPO | Admitting: Family Medicine

## 2017-10-12 ENCOUNTER — Other Ambulatory Visit: Payer: Self-pay | Admitting: Family Medicine

## 2017-10-17 ENCOUNTER — Other Ambulatory Visit: Payer: Self-pay | Admitting: Family Medicine

## 2017-10-26 ENCOUNTER — Encounter: Payer: Self-pay | Admitting: Family Medicine

## 2017-10-26 ENCOUNTER — Ambulatory Visit (INDEPENDENT_AMBULATORY_CARE_PROVIDER_SITE_OTHER): Payer: PPO | Admitting: Family Medicine

## 2017-10-26 VITALS — BP 148/65 | HR 78 | Temp 98.5°F | Ht 62.5 in | Wt 154.8 lb

## 2017-10-26 DIAGNOSIS — E785 Hyperlipidemia, unspecified: Secondary | ICD-10-CM | POA: Diagnosis not present

## 2017-10-26 DIAGNOSIS — E038 Other specified hypothyroidism: Secondary | ICD-10-CM

## 2017-10-26 DIAGNOSIS — E1169 Type 2 diabetes mellitus with other specified complication: Secondary | ICD-10-CM

## 2017-10-26 DIAGNOSIS — F418 Other specified anxiety disorders: Secondary | ICD-10-CM | POA: Diagnosis not present

## 2017-10-26 DIAGNOSIS — E119 Type 2 diabetes mellitus without complications: Secondary | ICD-10-CM | POA: Diagnosis not present

## 2017-10-26 DIAGNOSIS — Z9181 History of falling: Secondary | ICD-10-CM

## 2017-10-26 DIAGNOSIS — M810 Age-related osteoporosis without current pathological fracture: Secondary | ICD-10-CM | POA: Diagnosis not present

## 2017-10-26 DIAGNOSIS — E039 Hypothyroidism, unspecified: Secondary | ICD-10-CM

## 2017-10-26 DIAGNOSIS — I10 Essential (primary) hypertension: Secondary | ICD-10-CM

## 2017-10-26 DIAGNOSIS — Z0000 Encounter for general adult medical examination without abnormal findings: Secondary | ICD-10-CM

## 2017-10-26 MED ORDER — METFORMIN HCL 1000 MG PO TABS: ORAL_TABLET | ORAL | 3 refills | Status: DC

## 2017-10-26 MED ORDER — BUPROPION HCL ER (XL) 300 MG PO TB24: 300.0000 mg | ORAL_TABLET | Freq: Every day | ORAL | 3 refills | Status: DC

## 2017-10-26 MED ORDER — RALOXIFENE HCL 60 MG PO TABS: 60.0000 mg | ORAL_TABLET | Freq: Every day | ORAL | 3 refills | Status: DC

## 2017-10-26 MED ORDER — LOSARTAN POTASSIUM-HCTZ 100-25 MG PO TABS: 1.0000 | ORAL_TABLET | Freq: Every day | ORAL | 3 refills | Status: DC

## 2017-10-26 MED ORDER — FLUOXETINE HCL 40 MG PO CAPS: 40.0000 mg | ORAL_CAPSULE | Freq: Every day | ORAL | 3 refills | Status: DC

## 2017-10-26 MED ORDER — SIMVASTATIN 20 MG PO TABS
20.0000 mg | ORAL_TABLET | Freq: Every day | ORAL | 3 refills | Status: DC
Start: 2017-10-26 — End: 2018-09-05

## 2017-10-26 MED ORDER — AMLODIPINE BESYLATE 10 MG PO TABS: 10.0000 mg | ORAL_TABLET | Freq: Every day | ORAL | 3 refills | Status: DC

## 2017-10-26 MED ORDER — GLIPIZIDE ER 10 MG PO TB24: 10.0000 mg | ORAL_TABLET | Freq: Every day | ORAL | 3 refills | Status: DC

## 2017-10-26 NOTE — Patient Instructions (Addendum)
Don't forget to make your eye exam appt   If you are interested in the new shingles vaccine (Shingrix) - call your local pharmacy to check on coverage and availability  If affordable - get on the wait list   Please check some blood pressures at home when you are as relaxed as possible  Arm at heart level if possible in a chair with both feet on the floor   Schedule labs after 8/20 please   Try to eat less sweets  Watch portions  If you want to see a grief counselor at any time let us know   Take care of yourself

## 2017-10-26 NOTE — Progress Notes (Signed)
Subjective:    Patient ID: Amanda Benson, female    DOB: 09/28/1935, 82 y.o.   MRN: [id number]  HPI Here for health maintenance exam and to review chronic medical problems    Wt Readings from Last 3 Encounters:  10/26/17 154 lb 12 oz (70.2 kg)  08/22/17 153 lb 4 oz (69.5 kg)  06/08/17 153 lb 8 oz (69.6 kg)  weight is stable  Walking with walker - in the house  Appetite is ok - is eating more sweets  27.85 kg/m   Had amw on 08/22/17  Noted one fall this year w/o injury  Uses walker full time now and is helpful  Depression score of 11- grief / husb passed in May   Able to eat and sleep  Does not want to see a grief counselor- has good support   Eye exam 10/17 Long overdue  Family will make appt   Mammogram 12/15 (stable breast calcifications) Has declined mammograms since  Self breast exam   dexa 2/14- osteopenia in hips and OP in forearm Declines further dexa Intol to fosamax  Now on evista for fx risk reduction-no side effects or problem  Taking ca and D One fall no fx   Zoster status -interested in shingrix if covered   Colon cancer screening- no longer interested ifob neg 3/09   bp is up on first check today  (usually is)  No cp or palpitations or headaches or edema  No side effects to medicines  BP Readings from Last 3 Encounters:  10/26/17 (!) 158/78  08/22/17 118/62  06/08/17 135/60   amlodipine 10 mg  Losartan hct  2nd check BP: (!) 148/65       Lab Results  Component Value Date   CREATININE 1.07 08/22/2017   BUN 23 08/22/2017   NA 143 08/22/2017   K 3.6 08/22/2017   CL 99 08/22/2017   CO2 33 (H) 08/22/2017  glucose 257 Lab Results  Component Value Date   ALT 12 08/22/2017   AST 14 08/22/2017   ALKPHOS 47 08/22/2017   BILITOT 0.5 08/22/2017   Lab Results  Component Value Date   WBC 7.5 08/17/2016   HGB 13.3 08/17/2016   HCT 39.9 08/17/2016   MCV 88.6 08/17/2016   PLT 302.0 08/17/2016   Lab Results  Component Value Date   TSH 5.62 (H) 08/22/2017   sluggish/ fatigue from grief /depression  No new hair loss or skin changes      DM2 Lab Results  Component Value Date   HGBA1C 7.1 (H) 08/22/2017  glipizide xl 10 Metformin 1000 mg bid  ARB for renal protection  Vision -no change/overdue for exam Not checking blood sugar  Eating a little bit more   Hyperlipidemia  Lab Results  Component Value Date   CHOL 131 05/30/2017   CHOL 123 08/17/2016   CHOL 121 02/18/2016   Lab Results  Component Value Date   HDL 50.10 05/30/2017   HDL 53.60 08/17/2016   HDL 51.80 02/18/2016   Lab Results  Component Value Date   LDLCALC 54 05/30/2017   LDLCALC 42 08/17/2016   LDLCALC 48 02/18/2016   Lab Results  Component Value Date   TRIG 136.0 05/30/2017   TRIG 137.0 08/17/2016   TRIG 109.0 02/18/2016   Lab Results  Component Value Date   CHOLHDL 3 05/30/2017   CHOLHDL 2 08/17/2016   CHOLHDL 2 02/18/2016   Lab Results  Component Value Date   LDLDIRECT 107.2 09/19/2009  Simvastatin 20 mg and diet   Mood  Fluoxetine 40 mg  wellbutrin xl 300  Tolerating medicines Does not want to change anything   Patient Active Problem List   Diagnosis Date Noted  . Subclinical hypothyroidism 10/26/2017  . Depression with anxiety 08/24/2016  . Falls frequently 08/24/2016  . Generalized weakness 08/24/2016  . Routine general medical examination at a health care facility 05/16/2015  . Breast calcifications on mammogram 03/01/2014  . Encounter for Medicare annual wellness exam 05/30/2013  . Breast calcification seen on mammogram 12/14/2012  . Breast calcification, left 11/28/2012  . History of falling 11/27/2012  . Poor balance 11/27/2012  . Colon cancer screening 05/10/2012  . Osteoporosis 06/21/2007  . Diabetes type 2, controlled ([place]) 09/20/2006  . Hyperlipidemia associated with type 2 diabetes mellitus ([place]) 09/20/2006  . Essential hypertension 09/20/2006  . Osteoarthritis 09/20/2006   Past Medical  History:  Diagnosis Date  . Depression   . Diabetes mellitus    type II  . Hyperlipidemia   . Hypertension   . Obesity   . Osteoporosis    Past Surgical History:  Procedure Laterality Date  . ANKLE SURGERY     left medial malleblus fracture  . CHOLECYSTECTOMY     Social History   Tobacco Use  . Smoking status: Never Smoker  . Smokeless tobacco: Never Used  Substance Use Topics  . Alcohol use: No    Alcohol/week: 0.0 oz  . Drug use: No   Family History  Problem Relation Age of Onset  . Diabetes Mother   . Hypertension Mother   . Asthma Father   . Hypertension Sister   . Cancer Sister        lung  . Asthma Brother   . COPD Brother   . Cancer Maternal Grandmother        breast   . Asthma Brother    Allergies  Allergen Reactions  . Alendronate Sodium     REACTION: heartburn   Current Outpatient Medications on File Prior to Visit  Medication Sig Dispense Refill  . aspirin 81 MG EC tablet Take 81 mg by mouth daily.      . Blood Glucose Monitoring Suppl (ONE TOUCH ULTRA SYSTEM KIT) W/DEVICE KIT To check sugar daily and as needed for DM2 250.0  1 each 0  . glucose blood (ONE TOUCH ULTRA TEST) test strip Use to check blood sugar once daily (Dx. E11.9) 100 each 1   No current facility-administered medications on file prior to visit.      Review of Systems  Constitutional: Positive for fatigue. Negative for activity change, appetite change, fever and unexpected weight change.  HENT: Negative for congestion, ear pain, rhinorrhea, sinus pressure and sore throat.   Eyes: Negative for pain, redness and visual disturbance.  Respiratory: Negative for cough, shortness of breath and wheezing.   Cardiovascular: Negative for chest pain and palpitations.  Gastrointestinal: Negative for abdominal pain, blood in stool, constipation and diarrhea.  Endocrine: Negative for polydipsia and polyuria.  Genitourinary: Negative for dysuria, frequency and urgency.  Musculoskeletal:  Positive for arthralgias. Negative for back pain and myalgias.       Aches and pains - arthritis   Skin: Negative for pallor and rash.  Allergic/Immunologic: Negative for environmental allergies.  Neurological: Negative for dizziness, seizures, syncope, facial asymmetry, speech difficulty, light-headedness, numbness and headaches.       Poor balance  Hematological: Negative for adenopathy. Does not bruise/bleed easily.  Psychiatric/Behavioral: Negative for decreased concentration and  dysphoric mood. The patient is nervous/anxious.        Grief       Objective:   Physical Exam  Constitutional: She appears well-developed and well-nourished. No distress.  overwt and well appearing   HENT:  Head: Normocephalic and atraumatic.  Right Ear: External ear normal.  Left Ear: External ear normal.  Nose: Nose normal.  Mouth/Throat: Oropharynx is clear and moist.  Eyes: Pupils are equal, round, and reactive to light. Conjunctivae and EOM are normal. Right eye exhibits no discharge. Left eye exhibits no discharge. No scleral icterus.  Neck: Normal range of motion. Neck supple. No JVD present. Carotid bruit is not present. No thyromegaly present.  Cardiovascular: Normal rate, regular rhythm, normal heart sounds and intact distal pulses. Exam reveals no gallop.  Pulmonary/Chest: Effort normal and breath sounds normal. No respiratory distress. She has no wheezes. She has no rales.  Abdominal: Soft. Bowel sounds are normal. She exhibits no distension and no mass. There is no tenderness.  Genitourinary:  Genitourinary Comments: Declines breast exam  Musculoskeletal: She exhibits no edema or tenderness.  Poor rom knees   Lymphadenopathy:    She has no cervical adenopathy.  Neurological: She is alert. She has normal reflexes. She displays normal reflexes. No cranial nerve deficit. She exhibits normal muscle tone. Coordination normal.  No tremor  Poor balance -needs walker or assist for ambulation Can  get on table  Skin: Skin is warm and dry. No rash noted. No erythema. No pallor.  Solar lentigines diffusely Some sks   Psychiatric: Her speech is normal and behavior is normal. Thought content normal. Her mood appears anxious. She is not actively hallucinating. Cognition and memory are normal. She exhibits a depressed mood.  Seems down today  Tearful when disc loss of husband  Overall attentive and pleasant  She is attentive.          Assessment & Plan:   Problem List Items Addressed This Visit      Cardiovascular and Mediastinum   Essential hypertension    Improved on 2nd check but still not at goal Some white coat component  Will start checking bp at home when relaxed and update [place]  Lab planned for aug BP: (!) 148/65          Relevant Medications   amLODipine (NORVASC) 10 MG tablet   losartan-hydrochlorothiazide (HYZAAR) 100-25 MG tablet   simvastatin (ZOCOR) 20 MG tablet     Endocrine   Diabetes type 2, controlled ([place])    Lab Results  Component Value Date   HGBA1C 7.1 (H) 08/22/2017   Due for another in aug =set that up  Has eaten worse due to grief/lost husband Disc eye care-she will make her own opth appt (daughter will take her)  Also foot care  F/u - decide based on next labs       Relevant Medications   glipiZIDE (GLUCOTROL XL) 10 MG 24 hr tablet   losartan-hydrochlorothiazide (HYZAAR) 100-25 MG tablet   metFORMIN (GLUCOPHAGE) 1000 MG tablet   simvastatin (ZOCOR) 20 MG tablet   Hyperlipidemia associated with type 2 diabetes mellitus ([place])    Disc goals for lipids and reasons to control them Rev last labs with pt Rev low sat fat diet in detail Controlled with simvastatin and diet  Re check in aug      Relevant Medications   amLODipine (NORVASC) 10 MG tablet   glipiZIDE (GLUCOTROL XL) 10 MG 24 hr tablet   losartan-hydrochlorothiazide (HYZAAR) 100-25 MG tablet  metFORMIN (GLUCOPHAGE) 1000 MG tablet   simvastatin (ZOCOR) 20 MG tablet    Subclinical hypothyroidism    Lab Results  Component Value Date   TSH 5.62 (H) 08/22/2017   Will check FT4 in august  tx if low or if symptoms worsen        Musculoskeletal and Integument   Osteoporosis    dexa 2/14 Declines another Intol of fosamax Tolerating evista well   Disc need for calcium/ vitamin D/ wt bearing exercise and bone density test every 2 y to monitor Disc safety/ fracture risk in detail        Relevant Medications   raloxifene (EVISTA) 60 MG tablet     Other   Depression with anxiety    Was stable until loss of husband Now exp some grief  Reviewed stressors/ coping techniques/symptoms/ support sources/ tx options and side effects in detail today  Declines counseling  Good family support  Continues wellbutrin and prozac      Relevant Medications   buPROPion (WELLBUTRIN XL) 300 MG 24 hr tablet   FLUoxetine (PROZAC) 40 MG capsule   History of falling    Doing better now that she is using walker in the house One fall this year- no injuries Good family support       Routine general medical examination at a health care facility - Primary    Reviewed health habits including diet and exercise and skin cancer prevention Reviewed appropriate screening tests for age  Also reviewed health mt list, fam hx and immunization status , as well as social and family history   See HPI amw rev  Declines cancer screening  Declines another dexa  Will schedule her own eye exam  Disc getting shingrix vaccine  Labs due in august  whitecoat HTN in addn to essential -will start checking bp at home

## 2017-10-28 NOTE — Assessment & Plan Note (Signed)
Reviewed health habits including diet and exercise and skin cancer prevention Reviewed appropriate screening tests for age  Also reviewed health mt list, fam hx and immunization status , as well as social and family history   See HPI amw rev  Declines cancer screening  Declines another dexa  Will schedule her own eye exam  Disc getting shingrix vaccine  Labs due in august  whitecoat HTN in addn to essential -will start checking bp at home

## 2017-10-28 NOTE — Assessment & Plan Note (Signed)
Doing better now that she is using walker in the house One fall this year- no injuries Good family support

## 2017-10-28 NOTE — Assessment & Plan Note (Signed)
Improved on 2nd check but still not at goal Some white coat component  Will start checking bp at home when relaxed and update us  Lab planned for aug BP: (!) 148/65

## 2017-10-28 NOTE — Assessment & Plan Note (Signed)
Was stable until loss of husband Now exp some grief  Reviewed stressors/ coping techniques/symptoms/ support sources/ tx options and side effects in detail today  Declines counseling  Good family support  Continues wellbutrin and prozac

## 2017-10-28 NOTE — Assessment & Plan Note (Signed)
Lab Results  Component Value Date   HGBA1C 7.1 (H) 08/22/2017   Due for another in aug =set that up  Has eaten worse due to grief/lost husband Disc eye care-she will make her own opth appt (daughter will take her)  Also foot care  F/u - decide based on next labs

## 2017-10-28 NOTE — Assessment & Plan Note (Signed)
dexa 2/14 Declines another Intol of fosamax Tolerating evista well   Disc need for calcium/ vitamin D/ wt bearing exercise and bone density test every 2 y to monitor Disc safety/ fracture risk in detail

## 2017-10-28 NOTE — Assessment & Plan Note (Signed)
Disc goals for lipids and reasons to control them Rev last labs with pt Rev low sat fat diet in detail Controlled with simvastatin and diet  Re check in aug

## 2017-10-28 NOTE — Assessment & Plan Note (Signed)
Lab Results  Component Value Date   TSH 5.62 (H) 08/22/2017   Will check FT4 in august  tx if low or if symptoms worsen

## 2017-11-28 ENCOUNTER — Other Ambulatory Visit: Payer: PPO

## 2017-12-19 ENCOUNTER — Ambulatory Visit (INDEPENDENT_AMBULATORY_CARE_PROVIDER_SITE_OTHER): Payer: PPO | Admitting: Family Medicine

## 2017-12-19 ENCOUNTER — Encounter: Payer: Self-pay | Admitting: Family Medicine

## 2017-12-19 ENCOUNTER — Other Ambulatory Visit (INDEPENDENT_AMBULATORY_CARE_PROVIDER_SITE_OTHER): Payer: PPO

## 2017-12-19 VITALS — BP 160/72 | HR 74 | Temp 98.4°F | Ht 62.5 in | Wt 154.0 lb

## 2017-12-19 DIAGNOSIS — E1169 Type 2 diabetes mellitus with other specified complication: Secondary | ICD-10-CM | POA: Diagnosis not present

## 2017-12-19 DIAGNOSIS — E119 Type 2 diabetes mellitus without complications: Secondary | ICD-10-CM

## 2017-12-19 DIAGNOSIS — E038 Other specified hypothyroidism: Secondary | ICD-10-CM

## 2017-12-19 DIAGNOSIS — E785 Hyperlipidemia, unspecified: Secondary | ICD-10-CM

## 2017-12-19 DIAGNOSIS — I10 Essential (primary) hypertension: Secondary | ICD-10-CM | POA: Diagnosis not present

## 2017-12-19 DIAGNOSIS — H66002 Acute suppurative otitis media without spontaneous rupture of ear drum, left ear: Secondary | ICD-10-CM

## 2017-12-19 DIAGNOSIS — E039 Hypothyroidism, unspecified: Secondary | ICD-10-CM | POA: Diagnosis not present

## 2017-12-19 LAB — HEMOGLOBIN A1C: Hgb A1c MFr Bld: 7.2 % — ABNORMAL HIGH (ref 4.6–6.5)

## 2017-12-19 LAB — COMPREHENSIVE METABOLIC PANEL
ALBUMIN: 3.8 g/dL (ref 3.5–5.2)
ALK PHOS: 65 U/L (ref 39–117)
ALT: 15 U/L (ref 0–35)
AST: 17 U/L (ref 0–37)
BUN: 27 mg/dL — AB (ref 6–23)
CO2: 34 mEq/L — ABNORMAL HIGH (ref 19–32)
CREATININE: 1.07 mg/dL (ref 0.40–1.20)
Calcium: 10.7 mg/dL — ABNORMAL HIGH (ref 8.4–10.5)
Chloride: 100 mEq/L (ref 96–112)
GFR: 52.38 mL/min — ABNORMAL LOW (ref >60.00)
Glucose, Bld: 202 mg/dL — ABNORMAL HIGH (ref 70–99)
Potassium: 4.1 mEq/L (ref 3.5–5.1)
SODIUM: 141 meq/L (ref 135–145)
TOTAL PROTEIN: 7.1 g/dL (ref 6.0–8.3)
Total Bilirubin: 0.4 mg/dL (ref 0.2–1.2)

## 2017-12-19 LAB — LIPID PANEL
CHOLESTEROL: 134 mg/dL (ref 0–200)
HDL: 54.3 mg/dL (ref >39.00)
LDL CALC: 47 mg/dL (ref 0–99)
NonHDL: 80.13
TRIGLYCERIDES: 168 mg/dL — AB (ref 0.0–149.0)
Total CHOL/HDL Ratio: 2
VLDL: 33.6 mg/dL (ref 0.0–40.0)

## 2017-12-19 LAB — T4, FREE: FREE T4: 0.81 ng/dL (ref 0.60–1.60)

## 2017-12-19 LAB — TSH: TSH: 6.1 u[IU]/mL — ABNORMAL HIGH (ref 0.35–4.50)

## 2017-12-19 MED ORDER — AMOXICILLIN-POT CLAVULANATE 875-125 MG PO TABS: 1.0000 | ORAL_TABLET | Freq: Two times a day (BID) | ORAL | 0 refills | Status: DC

## 2017-12-19 NOTE — Patient Instructions (Addendum)
Can take ibuprofen 2 tablets (400 mg) every 8 hours alternating with Tylenol  If your pain gets worse, or you run a fever over 100 or you are not better in 2-3 days please let us know. If at night or on the weekend, please go to the emergency room     Otitis Media, Adult Otitis media is redness, soreness, and puffiness (swelling) in the space just behind your eardrum (middle ear). It may be caused by allergies or infection. It often happens along with a cold. Follow these instructions at home:  Take your medicine as told. Finish it even if you start to feel better.  Only take over-the-counter or prescription medicines for pain, discomfort, or fever as told by your doctor.  Follow up with your doctor as told. Contact a doctor if:  You have otitis media only in one ear, or bleeding from your nose, or both.  You notice a lump on your neck.  You are not getting better in 3-5 days.  You feel worse instead of better. Get help right away if:  You have pain that is not helped with medicine.  You have puffiness, redness, or pain around your ear.  You get a stiff neck.  You cannot move part of your face (paralysis).  You notice that the bone behind your ear hurts when you touch it. This information is not intended to replace advice given to you by your health care provider. Make sure you discuss any questions you have with your health care provider. Document Released: 09/08/2007 Document Revised: 08/28/2015 Document Reviewed: 10/17/2012 Elsevier Interactive Patient Education  2017 [employer]

## 2017-12-19 NOTE — Progress Notes (Signed)
Subjective:    Patient ID: Amanda KaufmannShirley A Benson, female    DOB: 04/12/1935, 82 y.o.   MRN: [id number]  HPI This is an 82 yo female who presents today with left ear pain x 3 days. No drainage. No fever. No headache. Pain is every few seconds. A little dizzy. No runny nose or sore throat. No cough, no SOB.  Took tylenol and ear drops without significant relief. Her daughter and grandson are here for her grandson's visit.  She wears hearing aids. Uses a walker at home. Holds on to someone when not at home.   Past Medical History:  Diagnosis Date  . Depression   . Diabetes mellitus    type II  . Hyperlipidemia   . Hypertension   . Obesity   . Osteoporosis    Past Surgical History:  Procedure Laterality Date  . ANKLE SURGERY     left medial malleblus fracture  . CHOLECYSTECTOMY     Family History  Problem Relation Age of Onset  . Diabetes Mother   . Hypertension Mother   . Asthma Father   . Hypertension Sister   . Cancer Sister        lung  . Asthma Brother   . COPD Brother   . Cancer Maternal Grandmother        breast   . Asthma Brother    Social History   Tobacco Use  . Smoking status: Never Smoker  . Smokeless tobacco: Never Used  Substance Use Topics  . Alcohol use: No    Alcohol/week: 0.0 standard drinks  . Drug use: No      Review of Systems Per HPI    Objective:   Physical Exam  Constitutional: She is oriented to person, place, and time. She appears well-developed and well-nourished.  Elderly, appears stated age.   HENT:  Head: Normocephalic and atraumatic.  Right Ear: Tympanic membrane, external ear and ear canal normal.  Left Ear: Ear canal normal.  Ears:  Nose: Nose normal.  Mouth/Throat: Oropharynx is clear and moist and mucous membranes are normal.  Left TM mildly injected, tender to exam.   Eyes: Conjunctivae are normal.  Cardiovascular: Normal rate.  Pulmonary/Chest: Effort normal.  Lymphadenopathy:    She has no cervical adenopathy.    Neurological: She is alert and oriented to person, place, and time.  Skin: Skin is warm and dry.  Psychiatric: She has a normal mood and affect. Her behavior is normal. Judgment and thought content normal.  Vitals reviewed.     BP (!) 160/72 (BP Location: Left Arm, Patient Position: Sitting, Cuff Size: Normal)   Pulse 74   Temp 98.4 F (36.9 C) (Oral)   Ht 5' 2.5" (1.588 m)   Wt 154 lb (69.9 kg)   SpO2 96%   BMI 27.72 kg/m  Wt Readings from Last 3 Encounters:  12/19/17 154 lb (69.9 kg)  10/26/17 154 lb 12 oz (70.2 kg)  08/22/17 153 lb 4 oz (69.5 kg)   BP Readings from Last 3 Encounters:  12/19/17 (!) 160/72  10/26/17 (!) 148/65  08/22/17 118/62       Assessment & Plan:  1. Non-recurrent acute suppurative otitis media of left ear without spontaneous rupture of tympanic membrane - discussed with patient and her daughter, provided information about otc analgesics - Strict RTC precautions advised- fever, increased pain, no improvement in 2-3 days - amoxicillin-clavulanate (AUGMENTIN) 875-125 MG tablet; Take 1 tablet by mouth 2 (two) times daily.  Dispense: 14 tablet;  Refill: 0   Olean Ree, FNP-BC  [facility], [facility]  12/19/2017 12:57 PM

## 2017-12-20 ENCOUNTER — Encounter: Payer: Self-pay | Admitting: *Deleted

## 2018-01-02 ENCOUNTER — Telehealth: Payer: Self-pay

## 2018-01-02 DIAGNOSIS — H9193 Unspecified hearing loss, bilateral: Secondary | ICD-10-CM

## 2018-01-02 DIAGNOSIS — H9190 Unspecified hearing loss, unspecified ear: Secondary | ICD-10-CM | POA: Insufficient documentation

## 2018-01-02 NOTE — Telephone Encounter (Signed)
I put the referral in for audiology eval Will route to [facility] Thanks

## 2018-01-02 NOTE — Telephone Encounter (Signed)
Copied from CRM [id number]. Topic: Referral - Request >> Jan 02, 2018 10:55 AM Lorayne Bender wrote: Reason for CRM:   Pt's daughter, [name], calling.  States that pt needs a referral for a hearing test.  Pt already sees Dr. Lacretia Leigh at [facility] and would like the referral to be sent there. Pt can be reached at [phone]

## 2018-01-03 NOTE — Telephone Encounter (Signed)
Called daughter and told her the Referral has been faxed to Dr Lucky Cowboy office.

## 2018-01-14 ENCOUNTER — Other Ambulatory Visit: Payer: Self-pay | Admitting: Family Medicine

## 2018-01-19 DIAGNOSIS — H903 Sensorineural hearing loss, bilateral: Secondary | ICD-10-CM | POA: Diagnosis not present

## 2018-02-01 ENCOUNTER — Telehealth: Payer: Self-pay

## 2018-02-01 NOTE — Telephone Encounter (Signed)
[place] with [facility] asking for their pharmacist to go over pts med list to see if any drug interactions. Eveline pharmacist with [facility] on phone and gave me all the meds on pts med list except augmentin and glucose supplies.Eveline wanted to verify pt was still on meds. Advised per med list qty with refills given on 10/26/17 would indicate pt is taking meds but I do not have any further knowledge whether pt is taking med or not. Eveline said there were no alerts and nothing further needed.

## 2018-03-14 ENCOUNTER — Ambulatory Visit (INDEPENDENT_AMBULATORY_CARE_PROVIDER_SITE_OTHER): Payer: PPO | Admitting: Family Medicine

## 2018-03-14 ENCOUNTER — Encounter: Payer: Self-pay | Admitting: Family Medicine

## 2018-03-14 VITALS — BP 148/80 | HR 83 | Temp 98.0°F | Ht 62.5 in | Wt 154.0 lb

## 2018-03-14 DIAGNOSIS — I10 Essential (primary) hypertension: Secondary | ICD-10-CM

## 2018-03-14 DIAGNOSIS — Z23 Encounter for immunization: Secondary | ICD-10-CM

## 2018-03-14 DIAGNOSIS — E119 Type 2 diabetes mellitus without complications: Secondary | ICD-10-CM

## 2018-03-14 DIAGNOSIS — Z9181 History of falling: Secondary | ICD-10-CM

## 2018-03-14 LAB — POCT GLYCOSYLATED HEMOGLOBIN (HGB A1C): Hemoglobin A1C: 6.9 % — AB (ref 4.0–5.6)

## 2018-03-14 MED ORDER — METOPROLOL SUCCINATE ER 25 MG PO TB24: 25.0000 mg | ORAL_TABLET | Freq: Every day | ORAL | 11 refills | Status: DC

## 2018-03-14 NOTE — Progress Notes (Signed)
Subjective:    Patient ID: Amanda Benson, female    DOB: Feb 15, 1936, 82 y.o.   MRN: [id number]  HPI Here for visit for diabetes   Nothing new going on  Her legs are more painful - has to take more breaks  Prone to falling  Using a walker at home   Wt Readings from Last 3 Encounters:  03/14/18 154 lb (69.9 kg)  12/19/17 154 lb (69.9 kg)  10/26/17 154 lb 12 oz (70.2 kg)   27.72 kg/m   Flu vaccine -given today   BP is high on first check as usual  bp is stable today  No cp or palpitations or headaches or edema  No side effects to medicines  BP Readings from Last 3 Encounters:  03/14/18 (!) 168/70  12/19/17 (!) 160/72  10/26/17 (!) 148/65    Amlodipine 10 Losartan hct 100-25 2nd check 148/80 -still high   Pulse Readings from Last 3 Encounters:  03/14/18 83  12/19/17 74  10/26/17 78     Diabetes Home sugar results -not checking them/too busy DM diet - not doing well (kids take turns staying with her) -they bring her fast food  Exercise  Symptoms A1C last 7.2 Today  Lab Results  Component Value Date   HGBA1C 6.9 (A) 03/14/2018    No problems with medications  Glipizide xl 10 mg  Metformin 1000 mg bid  Simvastatin   Renal protection- arb  Last eye exam  -  Lipids Lab Results  Component Value Date   CHOL 134 12/19/2017   HDL 54.30 12/19/2017   LDLCALC 47 12/19/2017   LDLDIRECT 107.2 09/19/2009   TRIG 168.0 (H) 12/19/2017   CHOLHDL 2 12/19/2017   Patient Active Problem List   Diagnosis Date Noted  . Hearing loss 01/02/2018  . Subclinical hypothyroidism 10/26/2017  . Depression with anxiety 08/24/2016  . Falls frequently 08/24/2016  . Generalized weakness 08/24/2016  . Routine general medical examination at a health care facility 05/16/2015  . Breast calcifications on mammogram 03/01/2014  . Encounter for Medicare annual wellness exam 05/30/2013  . Breast calcification seen on mammogram 12/14/2012  . Breast calcification, left 11/28/2012  .  History of falling 11/27/2012  . Poor balance 11/27/2012  . Colon cancer screening 05/10/2012  . Osteoporosis 06/21/2007  . Diabetes type 2, controlled ([place]) 09/20/2006  . Hyperlipidemia associated with type 2 diabetes mellitus ([place]) 09/20/2006  . Essential hypertension 09/20/2006  . Osteoarthritis 09/20/2006   Past Medical History:  Diagnosis Date  . Depression   . Diabetes mellitus    type II  . Hyperlipidemia   . Hypertension   . Obesity   . Osteoporosis    Past Surgical History:  Procedure Laterality Date  . ANKLE SURGERY     left medial malleblus fracture  . CHOLECYSTECTOMY     Social History   Tobacco Use  . Smoking status: Never Smoker  . Smokeless tobacco: Never Used  Substance Use Topics  . Alcohol use: No    Alcohol/week: 0.0 standard drinks  . Drug use: No   Family History  Problem Relation Age of Onset  . Diabetes Mother   . Hypertension Mother   . Asthma Father   . Hypertension Sister   . Cancer Sister        lung  . Asthma Brother   . COPD Brother   . Cancer Maternal Grandmother        breast   . Asthma Brother    Allergies  Allergen Reactions  . Alendronate Sodium     REACTION: heartburn   Current Outpatient Medications on File Prior to Visit  Medication Sig Dispense Refill  . amLODipine (NORVASC) 10 MG tablet Take 1 tablet (10 mg total) by mouth daily. 90 tablet 3  . aspirin 81 MG EC tablet Take 81 mg by mouth daily.      . Blood Glucose Monitoring Suppl (ONE TOUCH ULTRA SYSTEM KIT) W/DEVICE KIT To check sugar daily and as needed for DM2 250.0  1 each 0  . buPROPion (WELLBUTRIN XL) 300 MG 24 hr tablet Take 1 tablet (300 mg total) by mouth daily. 90 tablet 3  . FLUoxetine (PROZAC) 40 MG capsule Take 1 capsule (40 mg total) by mouth daily. 90 capsule 3  . glipiZIDE (GLUCOTROL XL) 10 MG 24 hr tablet Take 1 tablet (10 mg total) by mouth daily. 90 tablet 3  . glucose blood (ONE TOUCH ULTRA TEST) test strip Use to check blood sugar once daily  (Dx. E11.9) 100 each 1  . losartan-hydrochlorothiazide (HYZAAR) 100-25 MG tablet Take 1 tablet by mouth daily. 90 tablet 3  . metFORMIN (GLUCOPHAGE) 1000 MG tablet TAKE 1 TABLET(1000 MG) BY MOUTH TWICE DAILY WITH A MEAL 180 tablet 3  . raloxifene (EVISTA) 60 MG tablet Take 1 tablet (60 mg total) by mouth daily. 90 tablet 3  . simvastatin (ZOCOR) 20 MG tablet Take 1 tablet (20 mg total) by mouth daily. 90 tablet 3   No current facility-administered medications on file prior to visit.     Review of Systems  Constitutional: Negative for activity change, appetite change, fatigue, fever and unexpected weight change.  HENT: Negative for congestion, ear pain, rhinorrhea, sinus pressure and sore throat.   Eyes: Negative for pain, redness and visual disturbance.  Respiratory: Negative for cough, shortness of breath and wheezing.   Cardiovascular: Negative for chest pain and palpitations.  Gastrointestinal: Negative for abdominal pain, blood in stool, constipation and diarrhea.  Endocrine: Negative for polydipsia and polyuria.  Genitourinary: Negative for dysuria, frequency and urgency.  Musculoskeletal: Negative for arthralgias, back pain and myalgias.       Leg pain and weakness-chronic   Skin: Negative for pallor and rash.  Allergic/Immunologic: Negative for environmental allergies.  Neurological: Negative for dizziness, syncope and headaches.       Poor balance with hx of falls  Hematological: Negative for adenopathy. Does not bruise/bleed easily.  Psychiatric/Behavioral: Positive for dysphoric mood. Negative for confusion and decreased concentration. The patient is not nervous/anxious.        Objective:   Physical Exam  Constitutional: She appears well-developed and well-nourished. No distress.  overwt and well appearing Examined in chair   HENT:  Head: Normocephalic and atraumatic.  Mouth/Throat: Oropharynx is clear and moist.  Eyes: Pupils are equal, round, and reactive to light.  Conjunctivae and EOM are normal.  Neck: Normal range of motion. Neck supple. No JVD present. Carotid bruit is not present. No thyromegaly present.  Cardiovascular: Normal rate, regular rhythm, normal heart sounds and intact distal pulses. Exam reveals no gallop.  Pulmonary/Chest: Effort normal and breath sounds normal. No respiratory distress. She has no wheezes. She has no rales.  No crackles  Abdominal: Soft. Bowel sounds are normal. She exhibits no distension, no abdominal bruit and no mass. There is no tenderness.  Musculoskeletal: She exhibits no edema or deformity.  Lymphadenopathy:    She has no cervical adenopathy.  Neurological: She is alert. She has normal reflexes. She displays normal  reflexes. No cranial nerve deficit. She exhibits normal muscle tone. Coordination normal.  Needs assistance with gait   Skin: Skin is warm and dry. No rash noted. No pallor.  Psychiatric: She has a normal mood and affect.  Pleasant            Assessment & Plan:   Problem List Items Addressed This Visit      Cardiovascular and Mediastinum   Essential hypertension    bp is not at goal/has been trending up   BP Readings from Last 1 Encounters:  03/14/18 (!) 148/80   Will add toprol xl 25 mg daily (alert if side eff like dizziness) Continue losartan hct and amlodipine  F/u 3 mo  Most recent labs reviewed  Disc lifstyle change with low sodium diet and exercise        Relevant Medications   metoprolol succinate (TOPROL-XL) 25 MG 24 hr tablet     Endocrine   Diabetes type 2, controlled ([place])    Lab Results  Component Value Date   HGBA1C 6.9 (A) 03/14/2018   Stable to slt improved Letter drafted to give to family members to please stop brining fast food and junk food to her for meals  Exercise safely as tolerated  Ref done for annual eye exam - office will call her to schedule that  Nl foot exam  F/u 3 mo       Relevant Orders   POCT glycosylated hemoglobin (Hb A1C)  (Completed)   Ambulatory referral to Ophthalmology     Other   History of falling    Again stressed importance of walker use Chair exercise as tolerated         Other Visit Diagnoses    Need for influenza vaccination    -  Primary   Relevant Orders   Flu Vaccine QUAD 36+ mos IM (Completed)

## 2018-03-14 NOTE — Assessment & Plan Note (Signed)
Lab Results  Component Value Date   HGBA1C 6.9 (A) 03/14/2018   Stable to slt improved Letter drafted to give to family members to please stop brining fast food and junk food to her for meals  Exercise safely as tolerated  Ref done for annual eye exam - office will call her to schedule that  Nl foot exam  F/u 3 mo

## 2018-03-14 NOTE — Assessment & Plan Note (Signed)
bp is not at goal/has been trending up   BP Readings from Last 1 Encounters:  03/14/18 (!) 148/80   Will add toprol xl 25 mg daily (alert if side eff like dizziness) Continue losartan hct and amlodipine  F/u 3 mo  Most recent labs reviewed  Disc lifstyle change with low sodium diet and exercise

## 2018-03-14 NOTE — Patient Instructions (Addendum)
Flu vaccine today  We will refer you for a diabetic eye exam (out office will call you to schedule it)   Please try to watch diet for diabetes - avoid fast food Try to get most of your carbohydrates from produce (with the exception of white potatoes)  Eat less bread/pasta/rice/snack foods/cereals/sweets and other items from the middle of the grocery store (processed carbs)  Stay active (safely) with walker or do chair exercise   Diabetes is stable   Blood pressure is high  Add metoprolol xl 25 mg daily  If any side effects please stop it and let me know   Follow up in 3 months

## 2018-03-14 NOTE — Assessment & Plan Note (Signed)
Again stressed importance of walker use Chair exercise as tolerated

## 2018-03-23 DIAGNOSIS — E119 Type 2 diabetes mellitus without complications: Secondary | ICD-10-CM | POA: Diagnosis not present

## 2018-03-23 LAB — HM DIABETES EYE EXAM

## 2018-06-09 ENCOUNTER — Other Ambulatory Visit (INDEPENDENT_AMBULATORY_CARE_PROVIDER_SITE_OTHER): Payer: PPO

## 2018-06-09 DIAGNOSIS — I10 Essential (primary) hypertension: Secondary | ICD-10-CM | POA: Diagnosis not present

## 2018-06-09 DIAGNOSIS — E119 Type 2 diabetes mellitus without complications: Secondary | ICD-10-CM

## 2018-06-09 LAB — LIPID PANEL
CHOLESTEROL: 129 mg/dL (ref 0–200)
HDL: 48.1 mg/dL (ref >39.00)
NonHDL: 80.99
Total CHOL/HDL Ratio: 3
Triglycerides: 210 mg/dL — ABNORMAL HIGH (ref 0.0–149.0)
VLDL: 42 mg/dL — AB (ref 0.0–40.0)

## 2018-06-09 LAB — COMPREHENSIVE METABOLIC PANEL
ALK PHOS: 67 U/L (ref 39–117)
ALT: 19 U/L (ref 0–35)
AST: 22 U/L (ref 0–37)
Albumin: 3.4 g/dL — ABNORMAL LOW (ref 3.5–5.2)
BUN: 32 mg/dL — ABNORMAL HIGH (ref 6–23)
CO2: 31 mEq/L (ref 19–32)
Calcium: 10 mg/dL (ref 8.4–10.5)
Chloride: 99 mEq/L (ref 96–112)
Creatinine, Ser: 1.26 mg/dL — ABNORMAL HIGH (ref 0.40–1.20)
GFR: 40.76 mL/min — ABNORMAL LOW (ref >60.00)
Glucose, Bld: 276 mg/dL — ABNORMAL HIGH (ref 70–99)
Potassium: 4.1 mEq/L (ref 3.5–5.1)
Sodium: 138 mEq/L (ref 135–145)
Total Bilirubin: 0.5 mg/dL (ref 0.2–1.2)
Total Protein: 6.6 g/dL (ref 6.0–8.3)

## 2018-06-09 LAB — HEMOGLOBIN A1C: HEMOGLOBIN A1C: 9.1 % — AB (ref 4.6–6.5)

## 2018-06-09 LAB — LDL CHOLESTEROL, DIRECT: Direct LDL: 59 mg/dL

## 2018-06-13 ENCOUNTER — Encounter: Payer: Self-pay | Admitting: Family Medicine

## 2018-06-13 ENCOUNTER — Ambulatory Visit (INDEPENDENT_AMBULATORY_CARE_PROVIDER_SITE_OTHER): Payer: PPO | Admitting: Family Medicine

## 2018-06-13 VITALS — BP 135/60 | HR 71 | Temp 99.1°F | Ht 62.5 in | Wt 158.3 lb

## 2018-06-13 DIAGNOSIS — R300 Dysuria: Secondary | ICD-10-CM | POA: Diagnosis not present

## 2018-06-13 DIAGNOSIS — E1165 Type 2 diabetes mellitus with hyperglycemia: Secondary | ICD-10-CM | POA: Diagnosis not present

## 2018-06-13 DIAGNOSIS — E785 Hyperlipidemia, unspecified: Secondary | ICD-10-CM | POA: Diagnosis not present

## 2018-06-13 DIAGNOSIS — E1169 Type 2 diabetes mellitus with other specified complication: Secondary | ICD-10-CM

## 2018-06-13 DIAGNOSIS — R7989 Other specified abnormal findings of blood chemistry: Secondary | ICD-10-CM

## 2018-06-13 DIAGNOSIS — I10 Essential (primary) hypertension: Secondary | ICD-10-CM

## 2018-06-13 DIAGNOSIS — R8290 Unspecified abnormal findings in urine: Secondary | ICD-10-CM

## 2018-06-13 DIAGNOSIS — Z9181 History of falling: Secondary | ICD-10-CM

## 2018-06-13 LAB — POC URINALSYSI DIPSTICK (AUTOMATED)
Bilirubin, UA: 1
Blood, UA: NEGATIVE
GLUCOSE UA: POSITIVE — AB
Ketones, UA: NEGATIVE
Nitrite, UA: NEGATIVE
Protein, UA: POSITIVE — AB
Spec Grav, UA: 1.015 (ref 1.010–1.025)
Urobilinogen, UA: 0.2 E.U./dL
pH, UA: 6 (ref 5.0–8.0)

## 2018-06-13 MED ORDER — SITAGLIPTIN PHOSPHATE 50 MG PO TABS: 50.0000 mg | ORAL_TABLET | Freq: Every day | ORAL | 11 refills | Status: DC

## 2018-06-13 NOTE — Progress Notes (Signed)
Subjective:    Patient ID: Amanda Benson, female    DOB: 1935/08/03, 83 y.o.   MRN: [id number]  HPI' Here for f/u of chronic health problems   Had one fall- slid trying to sit on bed  Family put a light (so not dark) and also a bedside commode  Hit her head    Wt Readings from Last 3 Encounters:  06/13/18 158 lb 5 oz (71.8 kg)  03/14/18 154 lb (69.9 kg)  12/19/17 154 lb (69.9 kg)  wt is up 4 lb  Has been eating ice cream every night  28.49 kg/m     DM2 Lab Results  Component Value Date   HGBA1C 9.1 (H) 06/09/2018  family knows not to bring junk food and still does it  Not eating a diabetic diet  Metformin 1000 mg bid Glipizide xl 10 mg  Arb  Statin  Last eye exam -recent with [facility] -no retinopathy  Not mentally ready to cataract surgery yet   bp is stable today  No cp or palpitations or headaches or edema  No side effects to medicines  BP Readings from Last 3 Encounters:  06/13/18 (!) 146/74  03/14/18 (!) 148/80  12/19/17 (!) 160/72     Amlodipine 10 mg  Losartan hct 100-25 Metoprolol xl 25 mg   Pulse Readings from Last 3 Encounters:  06/13/18 71  03/14/18 83  12/19/17 74   Lab Results  Component Value Date   CREATININE 1.26 (H) 06/09/2018   BUN 32 (H) 06/09/2018   NA 138 06/09/2018   K 4.1 06/09/2018   CL 99 06/09/2018   CO2 31 06/09/2018   Cr is up from 1.07 ? Why Has incontinence baseline  Frequency is worse  No blood in urine  No odor to urine  Perhaps a little dysuria    Lab Results  Component Value Date   ALT 19 06/09/2018   AST 22 06/09/2018   ALKPHOS 67 06/09/2018   BILITOT 0.5 06/09/2018     Cholesterol Lab Results  Component Value Date   CHOL 129 06/09/2018   CHOL 134 12/19/2017   CHOL 131 05/30/2017   Lab Results  Component Value Date   HDL 48.10 06/09/2018   HDL 54.30 12/19/2017   HDL 50.10 05/30/2017   Lab Results  Component Value Date   LDLCALC 47 12/19/2017   LDLCALC 54 05/30/2017   LDLCALC 42  08/17/2016   Lab Results  Component Value Date   TRIG 210.0 (H) 06/09/2018   TRIG 168.0 (H) 12/19/2017   TRIG 136.0 05/30/2017   Lab Results  Component Value Date   CHOLHDL 3 06/09/2018   CHOLHDL 2 12/19/2017   CHOLHDL 3 05/30/2017   Lab Results  Component Value Date   LDLDIRECT 59.0 06/09/2018   LDLDIRECT 107.2 09/19/2009   Simvastatin 20   Patient Active Problem List   Diagnosis Date Noted  . Elevated serum creatinine 06/13/2018  . Dysuria 06/13/2018  . Hearing loss 01/02/2018  . Subclinical hypothyroidism 10/26/2017  . Depression with anxiety 08/24/2016  . Falls frequently 08/24/2016  . Generalized weakness 08/24/2016  . Routine general medical examination at a health care facility 05/16/2015  . Breast calcifications on mammogram 03/01/2014  . Encounter for Medicare annual wellness exam 05/30/2013  . Breast calcification seen on mammogram 12/14/2012  . Breast calcification, left 11/28/2012  . History of falling 11/27/2012  . Poor balance 11/27/2012  . Colon cancer screening 05/10/2012  . Osteoporosis 06/21/2007  . Diabetes type  2, controlled ([place]) 09/20/2006  . Hyperlipidemia associated with type 2 diabetes mellitus ([place]) 09/20/2006  . Essential hypertension 09/20/2006  . Osteoarthritis 09/20/2006   Past Medical History:  Diagnosis Date  . Depression   . Diabetes mellitus    type II  . Hyperlipidemia   . Hypertension   . Obesity   . Osteoporosis    Past Surgical History:  Procedure Laterality Date  . ANKLE SURGERY     left medial malleblus fracture  . CHOLECYSTECTOMY     Social History   Tobacco Use  . Smoking status: Never Smoker  . Smokeless tobacco: Never Used  Substance Use Topics  . Alcohol use: No    Alcohol/week: 0.0 standard drinks  . Drug use: No   Family History  Problem Relation Age of Onset  . Diabetes Mother   . Hypertension Mother   . Asthma Father   . Hypertension Sister   . Cancer Sister        lung  . Asthma Brother     . COPD Brother   . Cancer Maternal Grandmother        breast   . Asthma Brother    Allergies  Allergen Reactions  . Alendronate Sodium     REACTION: heartburn   Current Outpatient Medications on File Prior to Visit  Medication Sig Dispense Refill  . amLODipine (NORVASC) 10 MG tablet Take 1 tablet (10 mg total) by mouth daily. 90 tablet 3  . aspirin 81 MG EC tablet Take 81 mg by mouth daily.      . Blood Glucose Monitoring Suppl (ONE TOUCH ULTRA SYSTEM KIT) W/DEVICE KIT To check sugar daily and as needed for DM2 250.0  1 each 0  . buPROPion (WELLBUTRIN XL) 300 MG 24 hr tablet Take 1 tablet (300 mg total) by mouth daily. 90 tablet 3  . FLUoxetine (PROZAC) 40 MG capsule Take 1 capsule (40 mg total) by mouth daily. 90 capsule 3  . glipiZIDE (GLUCOTROL XL) 10 MG 24 hr tablet Take 1 tablet (10 mg total) by mouth daily. 90 tablet 3  . glucose blood (ONE TOUCH ULTRA TEST) test strip Use to check blood sugar once daily (Dx. E11.9) 100 each 1  . losartan-hydrochlorothiazide (HYZAAR) 100-25 MG tablet Take 1 tablet by mouth daily. 90 tablet 3  . metFORMIN (GLUCOPHAGE) 1000 MG tablet TAKE 1 TABLET(1000 MG) BY MOUTH TWICE DAILY WITH A MEAL 180 tablet 3  . metoprolol succinate (TOPROL-XL) 25 MG 24 hr tablet Take 1 tablet (25 mg total) by mouth daily. 30 tablet 11  . raloxifene (EVISTA) 60 MG tablet Take 1 tablet (60 mg total) by mouth daily. 90 tablet 3  . simvastatin (ZOCOR) 20 MG tablet Take 1 tablet (20 mg total) by mouth daily. 90 tablet 3   No current facility-administered medications on file prior to visit.     Review of Systems  Constitutional: Positive for fatigue. Negative for activity change, appetite change, fever and unexpected weight change.  HENT: Negative for congestion, ear pain, rhinorrhea, sinus pressure and sore throat.   Eyes: Negative for pain, redness and visual disturbance.  Respiratory: Negative for cough, shortness of breath and wheezing.   Cardiovascular: Negative for  chest pain and palpitations.  Gastrointestinal: Negative for abdominal pain, blood in stool, constipation and diarrhea.  Endocrine: Negative for polydipsia and polyuria.  Genitourinary: Positive for dysuria, frequency and urgency.  Musculoskeletal: Positive for arthralgias and back pain. Negative for myalgias.  Skin: Negative for pallor and rash.  Allergic/Immunologic: Negative for environmental allergies.  Neurological: Negative for dizziness, syncope and headaches.       Fall recent   Hematological: Negative for adenopathy. Does not bruise/bleed easily.  Psychiatric/Behavioral: Positive for dysphoric mood. Negative for decreased concentration. The patient is not nervous/anxious.        Objective:   Physical Exam        Assessment & Plan:   Problem List Items Addressed This Visit      Cardiovascular and Mediastinum   Essential hypertension    bp in fair control at this time  BP Readings from Last 1 Encounters:  06/13/18 135/60   No changes needed Most recent labs reviewed  Disc lifstyle change with low sodium diet and exercise          Endocrine   DM (diabetes mellitus), type 2, uncontrolled ([place]) - Primary    Lab Results  Component Value Date   HGBA1C 9.1 (H) 06/09/2018   Significantly worse Not eating right or checking glucose  Cr is up as well Sent for eye exam report Stressed imp of DM diet Adding januvia 50 mg  inst to check glucose bid and prn  Family continues to prep/bring food that is unhealthy      Relevant Medications   sitaGLIPtin (JANUVIA) 50 MG tablet   Hyperlipidemia associated with type 2 diabetes mellitus ([place])    Disc goals for lipids and reasons to control them Rev last labs with pt Rev low sat fat diet in detail Controlled with statin  Enc better diet       Relevant Medications   sitaGLIPtin (JANUVIA) 50 MG tablet     Other   History of falling    Recent fall getting back in bed -with walker  Family installed a new light and  also bed side commode for her  Disc bed rail if needed      Elevated serum creatinine    Disc imp of fluid intake ua today- some symptoms of uti  Consider dec metformin if needed Not taking nsaids Disc fluid intake      Relevant Orders   POCT Urinalysis Dipstick (Automated) (Completed)   Urine Culture   Dysuria    ua with leukocytes Pend cx to tx      Relevant Orders   POCT Urinalysis Dipstick (Automated) (Completed)   Urine Culture    Other Visit Diagnoses    Abnormal urinalysis       Relevant Orders   Urine Culture

## 2018-06-13 NOTE — Assessment & Plan Note (Signed)
Recent fall getting back in bed -with walker  Family installed a new light and also bed side commode for her  Disc bed rail if needed

## 2018-06-13 NOTE — Assessment & Plan Note (Signed)
ua with leukocytes Pend cx to tx

## 2018-06-13 NOTE — Assessment & Plan Note (Signed)
bp in fair control at this time  BP Readings from Last 1 Encounters:  06/13/18 135/60   No changes needed Most recent labs reviewed  Disc lifstyle change with low sodium diet and exercise

## 2018-06-13 NOTE — Assessment & Plan Note (Signed)
Disc goals for lipids and reasons to control them Rev last labs with pt Rev low sat fat diet in detail Controlled with statin  Enc better diet

## 2018-06-13 NOTE — Patient Instructions (Signed)
We will check a urine sample today - ? Possible uti  Kidney number is elevated   Continue current medicines  We will add low dose januvia 50 mg daily  If any problems let me if  If hypoglycemia please alert me  Check glucose 2 times daily for now   Consider giving up sugar beverages /including juice Diet is ok  Also please stop sweets if motivated to do so (ice cream)   Try to get most of your carbohydrates from produce (with the exception of white potatoes)  Eat less bread/pasta/rice/snack foods/cereals/sweets and other items from the middle of the grocery store (processed carbs)   Keep up a good water intake   Stop at check out to sign release so we can get your eye exam report   Follow up in 3 months with labs prior

## 2018-06-13 NOTE — Assessment & Plan Note (Signed)
Lab Results  Component Value Date   HGBA1C 9.1 (H) 06/09/2018   Significantly worse Not eating right or checking glucose  Cr is up as well Sent for eye exam report Stressed imp of DM diet Adding januvia 50 mg  inst to check glucose bid and prn  Family continues to prep/bring food that is unhealthy

## 2018-06-13 NOTE — Assessment & Plan Note (Signed)
Disc imp of fluid intake ua today- some symptoms of uti  Consider dec metformin if needed Not taking nsaids Disc fluid intake

## 2018-06-15 ENCOUNTER — Telehealth: Payer: Self-pay | Admitting: *Deleted

## 2018-06-15 LAB — URINE CULTURE
MICRO NUMBER:: 299936
SPECIMEN QUALITY:: ADEQUATE

## 2018-06-15 MED ORDER — CEPHALEXIN 500 MG PO CAPS: 500.0000 mg | ORAL_CAPSULE | Freq: Two times a day (BID) | ORAL | 0 refills | Status: DC

## 2018-06-15 NOTE — Telephone Encounter (Signed)
Daughter notified of urine cx results and Rx sent to pharmacy and f/u lab appt scheduled

## 2018-06-15 NOTE — Telephone Encounter (Signed)
-----   Message from Judy Pimple, MD sent at 06/15/2018 11:06 AM EDT ----- Her urine cx shows multiple organisms-not one predominant bacteria  (? If contaminated)   Please send in keflex 500 mg 1 po bid for 5 d #10 no refills -for broad coverage of possible uti Please try to drink more fluids  Then check non fasting renal profile in about 2 weeks (I would like to see Cr go down) Thanks

## 2018-06-27 ENCOUNTER — Encounter: Payer: Self-pay | Admitting: Family Medicine

## 2018-06-29 ENCOUNTER — Telehealth: Payer: Self-pay | Admitting: Family Medicine

## 2018-06-29 DIAGNOSIS — I10 Essential (primary) hypertension: Secondary | ICD-10-CM

## 2018-06-29 DIAGNOSIS — R7989 Other specified abnormal findings of blood chemistry: Secondary | ICD-10-CM

## 2018-06-29 NOTE — Telephone Encounter (Signed)
-----   Message from Alvina Chou sent at 06/28/2018  3:05 PM EDT ----- Regarding: Lab orders for Monday, 3.30.20 Lab orders needed for a 2 week f/u, Do you want her to come in, drive through for labs or r/s

## 2018-06-29 NOTE — Telephone Encounter (Signed)
I would like her to come in for a non fasting renal profile- drive through if she is able Thanks

## 2018-07-03 ENCOUNTER — Other Ambulatory Visit (INDEPENDENT_AMBULATORY_CARE_PROVIDER_SITE_OTHER): Payer: PPO

## 2018-07-03 ENCOUNTER — Other Ambulatory Visit: Payer: Self-pay

## 2018-07-03 DIAGNOSIS — R7989 Other specified abnormal findings of blood chemistry: Secondary | ICD-10-CM

## 2018-07-03 DIAGNOSIS — I10 Essential (primary) hypertension: Secondary | ICD-10-CM | POA: Diagnosis not present

## 2018-07-03 LAB — RENAL FUNCTION PANEL
Albumin: 3.3 g/dL — ABNORMAL LOW (ref 3.5–5.2)
BUN: 25 mg/dL — ABNORMAL HIGH (ref 6–23)
CO2: 31 mEq/L (ref 19–32)
CREATININE: 1.18 mg/dL (ref 0.40–1.20)
Calcium: 9.7 mg/dL (ref 8.4–10.5)
Chloride: 105 mEq/L (ref 96–112)
GFR: 43.96 mL/min — ABNORMAL LOW (ref >60.00)
Glucose, Bld: 129 mg/dL — ABNORMAL HIGH (ref 70–99)
Phosphorus: 2.8 mg/dL (ref 2.3–4.6)
Potassium: 4.4 mEq/L (ref 3.5–5.1)
Sodium: 143 mEq/L (ref 135–145)

## 2018-08-12 ENCOUNTER — Other Ambulatory Visit: Payer: Self-pay | Admitting: Family Medicine

## 2018-08-28 ENCOUNTER — Telehealth: Payer: Self-pay | Admitting: Family Medicine

## 2018-08-28 DIAGNOSIS — E039 Hypothyroidism, unspecified: Secondary | ICD-10-CM

## 2018-08-28 DIAGNOSIS — I10 Essential (primary) hypertension: Secondary | ICD-10-CM

## 2018-08-28 DIAGNOSIS — E1165 Type 2 diabetes mellitus with hyperglycemia: Secondary | ICD-10-CM

## 2018-08-28 DIAGNOSIS — E1169 Type 2 diabetes mellitus with other specified complication: Secondary | ICD-10-CM

## 2018-08-28 DIAGNOSIS — E038 Other specified hypothyroidism: Secondary | ICD-10-CM

## 2018-08-28 NOTE — Telephone Encounter (Signed)
-----   Message from Aquilla Solian, RT sent at 08/25/2018 12:56 PM EDT ----- Regarding: Lab Orders for Wednesday 5.27.2020 Please place lab orders for Wednesday 5.27.2020, annual visit on Tuesday 6.2.2020 Thank you, Jones Bales RT(R)

## 2018-08-29 ENCOUNTER — Telehealth: Payer: Self-pay

## 2018-08-29 ENCOUNTER — Ambulatory Visit (INDEPENDENT_AMBULATORY_CARE_PROVIDER_SITE_OTHER): Payer: PPO

## 2018-08-29 DIAGNOSIS — Z0000 Encounter for general adult medical examination without abnormal findings: Secondary | ICD-10-CM | POA: Diagnosis not present

## 2018-08-29 NOTE — Telephone Encounter (Signed)
Left detailed VM w COVID screen and curbside info with daughter

## 2018-08-29 NOTE — Progress Notes (Signed)
Subjective:   Amanda Benson is a 83 y.o. female who presents for Medicare Annual (Subsequent) preventive examination.  Review of Systems:  N/A Cardiac Risk Factors include: advanced age (>5mn, >>70women);diabetes mellitus;dyslipidemia;hypertension     Objective:     Vitals: There were no vitals taken for this visit.  There is no height or weight on file to calculate BMI.  Advanced Directives 08/29/2018 08/22/2017 08/25/2016 08/17/2016  Does Patient Have a Medical Advance Directive? Yes No No No  Type of [place] - - -  Does patient want to make changes to medical advance directive? No - Patient declined Yes (MAU/Ambulatory/Procedural Areas - Information given) (No Data) -  Copy of [place]? Yes - validated most recent copy scanned in chart (See row information) - - -  Would patient like information on creating a medical advance directive? No - Patient declined - - -    Tobacco Social History   Tobacco Use  Smoking Status Never Smoker  Smokeless Tobacco Never Used     Counseling given: No   Clinical Intake:  Pre-visit preparation completed: Yes  Pain : No/denies pain Pain Score: 0-No pain     Nutritional Status: BMI 25 -29 Overweight Nutritional Risks: None Diabetes: No  How often do you need to have someone help you when you read instructions, pamphlets, or other written materials from your doctor or pharmacy?: 1 - Never  Interpreter Needed?: No  Comments: pt lives with daughter Information entered by :: LPinson, LPN  Past Medical History:  Diagnosis Date  . Depression   . Diabetes mellitus    type II  . Hyperlipidemia   . Hypertension   . Obesity   . Osteoporosis    Past Surgical History:  Procedure Laterality Date  . ANKLE SURGERY     left medial malleblus fracture  . CHOLECYSTECTOMY     Family History  Problem Relation Age of Onset  . Diabetes Mother   .  Hypertension Mother   . Asthma Father   . Hypertension Sister   . Cancer Sister        lung  . Asthma Brother   . COPD Brother   . Cancer Maternal Grandmother        breast   . Asthma Brother    Social History   Socioeconomic History  . Marital status: Married    Spouse name: Not on file  . Number of children: 7  . Years of education: Not on file  . Highest education level: Not on file  Occupational History    Employer: RETIRED  Social Needs  . Financial resource strain: Not on file  . Food insecurity:    Worry: Not on file    Inability: Not on file  . Transportation needs:    Medical: Not on file    Non-medical: Not on file  Tobacco Use  . Smoking status: Never Smoker  . Smokeless tobacco: Never Used  Substance and Sexual Activity  . Alcohol use: No    Alcohol/week: 0.0 standard drinks  . Drug use: No  . Sexual activity: Not Currently  Lifestyle  . Physical activity:    Days per week: Not on file    Minutes per session: Not on file  . Stress: Not on file  Relationships  . Social connections:    Talks on phone: Not on file    Gets together: Not on file    Attends  religious service: Not on file    Active member of club or organization: Not on file    Attends meetings of clubs or organizations: Not on file    Relationship status: Not on file  Other Topics Concern  . Not on file  Social History Narrative  . Not on file    Outpatient Encounter Medications as of 08/29/2018  Medication Sig  . amLODipine (NORVASC) 10 MG tablet Take 1 tablet (10 mg total) by mouth daily.  Marland Kitchen aspirin 81 MG EC tablet Take 81 mg by mouth daily.    . Blood Glucose Monitoring Suppl (ONE TOUCH ULTRA SYSTEM KIT) W/DEVICE KIT To check sugar daily and as needed for DM2 250.0   . buPROPion (WELLBUTRIN XL) 300 MG 24 hr tablet Take 1 tablet (300 mg total) by mouth daily.  Marland Kitchen glipiZIDE (GLUCOTROL XL) 10 MG 24 hr tablet Take 1 tablet (10 mg total) by mouth daily.  Marland Kitchen glucose blood (ONE TOUCH  ULTRA TEST) test strip Use to check blood sugar once daily (Dx. E11.9)  . losartan-hydrochlorothiazide (HYZAAR) 100-25 MG tablet Take 1 tablet by mouth daily.  . metFORMIN (GLUCOPHAGE) 1000 MG tablet TAKE 1 TABLET(1000 MG) BY MOUTH TWICE DAILY WITH A MEAL  . metoprolol succinate (TOPROL-XL) 25 MG 24 hr tablet Take 1 tablet (25 mg total) by mouth daily.  . raloxifene (EVISTA) 60 MG tablet Take 1 tablet (60 mg total) by mouth daily.  . simvastatin (ZOCOR) 20 MG tablet Take 1 tablet (20 mg total) by mouth daily.  . sitaGLIPtin (JANUVIA) 50 MG tablet Take 1 tablet (50 mg total) by mouth daily.  Marland Kitchen FLUoxetine (PROZAC) 40 MG capsule Take 1 capsule (40 mg total) by mouth daily.  . [DISCONTINUED] cephALEXin (KEFLEX) 500 MG capsule Take 1 capsule (500 mg total) by mouth 2 (two) times daily.   No facility-administered encounter medications on file as of 08/29/2018.     Activities of Daily Living In your present state of health, do you have any difficulty performing the following activities: 08/29/2018  Hearing? Y  Vision? N  Difficulty concentrating or making decisions? N  Walking or climbing stairs? Y  Dressing or bathing? N  Doing errands, shopping? Y  Preparing Food and eating ? Y  Using the Toilet? N  In the past six months, have you accidently leaked urine? N  Do you have problems with loss of bowel control? N  Managing your Medications? Y  Managing your Finances? Y  Housekeeping or managing your Housekeeping? Y  Some recent data might be hidden    Patient Care Team: Tower, [name], MD as PCP - General    Assessment:   This is a routine wellness examination for [name].  Vision Screening Comments: Vision exam in 2019 @ [facility]  Exercise Activities and Dietary recommendations Current Exercise Habits: The patient does not participate in regular exercise at present, Exercise limited by: None identified  Goals    . Patient Stated     Starting 08/29/2018, I will continue to  take medications as prescribed.         Fall Risk Fall Risk  08/29/2018 08/22/2017 08/17/2016 05/16/2015 05/30/2013  Falls in the past year? 0 Yes Yes No Yes  Comment - lost balance and fell; denies injury 4 falls per daughter; no medical treatment - -  Number falls in past yr: - 1 2 or more - 2 or more  Injury with Fall? - No No - -  Risk Factor Category  - - - -  High Fall Risk  Risk for fall due to : - - Impaired balance/gait;Impaired mobility - -   Depression Screen PHQ 2/9 Scores 08/29/2018 08/22/2017 08/17/2016 05/16/2015  PHQ - 2 Score '3 2 1 '$ 0  PHQ- 9 Score 4 11 - -     Cognitive Function MMSE - Mini Mental State Exam 08/29/2018 08/22/2017 08/17/2016  Orientation to time '5 5 5  '$ Orientation to Place '5 5 5  '$ Registration '3 3 3  '$ Attention/ Calculation 0 0 0  Recall '3 3 3  '$ Language- name 2 objects 0 0 0  Language- repeat '1 1 1  '$ Language- follow 3 step command 0 3 3  Language- read & follow direction 0 0 0  Write a sentence 0 0 0  Copy design 0 0 0  Total score '17 20 20        '$ Immunization History  Administered Date(s) Administered  . Influenza Split 02/18/2011  . Influenza Whole 01/12/2002, 04/08/2009, 01/05/2010  . Influenza,inj,Quad PF,6+ Mos 05/10/2014, 02/12/2015, 02/24/2016, 06/08/2017, 03/14/2018  . Pneumococcal Conjugate-13 02/12/2015  . Pneumococcal Polysaccharide-23 11/26/2011  . Td 10/30/2002, 05/10/2012     Screening Tests Health Maintenance  Topic Date Due  . MAMMOGRAM  05/07/2020 (Originally 03/22/2015)  . INFLUENZA VACCINE  11/04/2018  . HEMOGLOBIN A1C  12/10/2018  . FOOT EXAM  03/15/2019  . OPHTHALMOLOGY EXAM  03/24/2019  . TETANUS/TDAP  05/10/2022  . DEXA SCAN  Completed  . PNA vac Low Risk Adult  Completed       Plan:     I have personally reviewed, addressed, and noted the following in the patient's chart:  A. Medical and social history B. Use of alcohol, tobacco or illicit drugs  C. Current medications and supplements D. Functional ability  and status E.  Nutritional status F.  Physical activity G. Advance directives H. List of other physicians I.  Hospitalizations, surgeries, and ER visits in previous 12 months J.  Vitals (unless it is a telemedicine encounter) K. Screenings to include cognitive, depression, hearing, vision (NOTE: hearing and vision screenings not completed in telemedicine encounter) L. Referrals and appointments   In addition, I have reviewed and discussed with patient certain preventive protocols, quality metrics, and best practice recommendations. A written personalized care plan for preventive services and recommendations were provided to patient.  With patient's permission, we connected on 08/29/18 at 10:30 AM EDT by a video enabled telemedicine application. Two patient identifiers were used to ensure the encounter occurred with the correct person.    Patient was in home and writer was in office.   Signed,   Lindell Noe, MHA, BS, LPN Health Coach

## 2018-08-29 NOTE — Patient Instructions (Signed)
Ms. Renegar , Thank you for taking time to come for your Medicare Wellness Visit. I appreciate your ongoing commitment to your health goals. Please review the following plan we discussed and let me know if I can assist you in the future.   These are the goals we discussed: Goals    . Patient Stated     Starting 08/29/2018, I will continue to take medications as prescribed.         This is a list of the screening recommended for you and due dates:  Health Maintenance  Topic Date Due  . Mammogram  05/07/2020*  . Flu Shot  11/04/2018  . Hemoglobin A1C  12/10/2018  . Complete foot exam   03/15/2019  . Eye exam for diabetics  03/24/2019  . Tetanus Vaccine  05/10/2022  . DEXA scan (bone density measurement)  Completed  . Pneumonia vaccines  Completed  *Topic was postponed. The date shown is not the original due date.   Preventive Care for Adults  A healthy lifestyle and preventive care can promote health and wellness. Preventive health guidelines for adults include the following key practices.  . A routine yearly physical is a good way to check with your health care provider about your health and preventive screening. It is a chance to share any concerns and updates on your health and to receive a thorough exam.  . Visit your dentist for a routine exam and preventive care every 6 months. Brush your teeth twice a day and floss once a day. Good oral hygiene prevents tooth decay and gum disease.  . The frequency of eye exams is based on your age, health, family medical history, use  of contact lenses, and other factors. Follow your health care provider's recommendations for frequency of eye exams.  . Eat a healthy diet. Foods like vegetables, fruits, whole grains, low-fat dairy products, and lean protein foods contain the nutrients you need without too many calories. Decrease your intake of foods high in solid fats, added sugars, and salt. Eat the right amount of calories for you. Get  information about a proper diet from your health care provider, if necessary.  . Regular physical exercise is one of the most important things you can do for your health. Most adults should get at least 150 minutes of moderate-intensity exercise (any activity that increases your heart rate and causes you to sweat) each week. In addition, most adults need muscle-strengthening exercises on 2 or more days a week.  Silver Sneakers may be a benefit available to you. To determine eligibility, you may visit the website: www.silversneakers.com or contact program at (480) 204-9294 Mon-Fri between 8AM-8PM.   . Maintain a healthy weight. The body mass index (BMI) is a screening tool to identify possible weight problems. It provides an estimate of body fat based on height and weight. Your health care provider can find your BMI and can help you achieve or maintain a healthy weight.   For adults 20 years and older: ? A BMI below 18.5 is considered underweight. ? A BMI of 18.5 to 24.9 is normal. ? A BMI of 25 to 29.9 is considered overweight. ? A BMI of 30 and above is considered obese.   . Maintain normal blood lipids and cholesterol levels by exercising and minimizing your intake of saturated fat. Eat a balanced diet with plenty of fruit and vegetables. Blood tests for lipids and cholesterol should begin at age 43 and be repeated every 5 years. If your lipid or  cholesterol levels are high, you are over 50, or you are at high risk for heart disease, you may need your cholesterol levels checked more frequently. Ongoing high lipid and cholesterol levels should be treated with medicines if diet and exercise are not working.  . If you smoke, find out from your health care provider how to quit. If you do not use tobacco, please do not start.  . If you choose to drink alcohol, please do not consume more than 2 drinks per day. One drink is considered to be 12 ounces (355 mL) of beer, 5 ounces (148 mL) of wine, or 1.5  ounces (44 mL) of liquor.  . If you are 3-68 years old, ask your health care provider if you should take aspirin to prevent strokes.  . Use sunscreen. Apply sunscreen liberally and repeatedly throughout the day. You should seek shade when your shadow is shorter than you. Protect yourself by wearing long sleeves, pants, a wide-brimmed hat, and sunglasses year round, whenever you are outdoors.  . Once a month, do a whole body skin exam, using a mirror to look at the skin on your back. Tell your health care provider of new moles, moles that have irregular borders, moles that are larger than a pencil eraser, or moles that have changed in shape or color.

## 2018-08-29 NOTE — Progress Notes (Signed)
PCP notes:   Health maintenance:  No gaps identified  Abnormal screenings:   Depression score: 4 Depression screen [facility] 2/9 08/29/2018 08/22/2017 08/17/2016 05/16/2015 05/30/2013  Decreased Interest 1 1 0 0 0  Down, Depressed, Hopeless 2 1 1  0 0  PHQ - 2 Score 3 2 1  0 0  Altered sleeping 0 2 - - -  Tired, decreased energy 1 1 - - -  Change in appetite 0 2 - - -  Feeling bad or failure about yourself  0 2 - - -  Trouble concentrating 0 1 - - -  Moving slowly or fidgety/restless 0 1 - - -  Suicidal thoughts 0 0 - - -  PHQ-9 Score 4 11 - - -  Difficult doing work/chores Somewhat difficult Somewhat difficult - - -    Patient concerns:   None  Nurse concerns:  None  Next PCP appt:   09/05/18 @ 1130  I reviewed health advisor's note, was available for consultation, and agree with documentation and plan. Roxy Manns MD

## 2018-08-30 ENCOUNTER — Other Ambulatory Visit (INDEPENDENT_AMBULATORY_CARE_PROVIDER_SITE_OTHER): Payer: PPO

## 2018-08-30 DIAGNOSIS — E038 Other specified hypothyroidism: Secondary | ICD-10-CM

## 2018-08-30 DIAGNOSIS — I10 Essential (primary) hypertension: Secondary | ICD-10-CM

## 2018-08-30 DIAGNOSIS — E039 Hypothyroidism, unspecified: Secondary | ICD-10-CM

## 2018-08-30 DIAGNOSIS — E1165 Type 2 diabetes mellitus with hyperglycemia: Secondary | ICD-10-CM | POA: Diagnosis not present

## 2018-08-30 DIAGNOSIS — E785 Hyperlipidemia, unspecified: Secondary | ICD-10-CM | POA: Diagnosis not present

## 2018-08-30 DIAGNOSIS — E1169 Type 2 diabetes mellitus with other specified complication: Secondary | ICD-10-CM

## 2018-08-30 LAB — LIPID PANEL
Cholesterol: 121 mg/dL (ref 0–200)
HDL: 45.9 mg/dL (ref >39.00)
LDL Cholesterol: 48 mg/dL (ref 0–99)
NonHDL: 75.28
Total CHOL/HDL Ratio: 3
Triglycerides: 134 mg/dL (ref 0.0–149.0)
VLDL: 26.8 mg/dL (ref 0.0–40.0)

## 2018-08-30 LAB — CBC WITH DIFFERENTIAL/PLATELET
Basophils Absolute: 0 10*3/uL (ref 0.0–0.1)
Basophils Relative: 0.4 % (ref 0.0–3.0)
Eosinophils Absolute: 0.2 10*3/uL (ref 0.0–0.7)
Eosinophils Relative: 3.3 % (ref 0.0–5.0)
HCT: 36.5 % (ref 36.0–46.0)
Hemoglobin: 12.4 g/dL (ref 12.0–15.0)
Lymphocytes Relative: 23.5 % (ref 12.0–46.0)
Lymphs Abs: 1.7 10*3/uL (ref 0.7–4.0)
MCHC: 33.9 g/dL (ref 30.0–36.0)
MCV: 89.2 fl (ref 78.0–100.0)
Monocytes Absolute: 0.5 10*3/uL (ref 0.1–1.0)
Monocytes Relative: 7.4 % (ref 3.0–12.0)
Neutro Abs: 4.6 10*3/uL (ref 1.4–7.7)
Neutrophils Relative %: 65.4 % (ref 43.0–77.0)
Platelets: 317 10*3/uL (ref 150.0–400.0)
RBC: 4.09 Mil/uL (ref 3.87–5.11)
RDW: 12.8 % (ref 11.5–15.5)
WBC: 7 10*3/uL (ref 4.0–10.5)

## 2018-08-30 LAB — COMPREHENSIVE METABOLIC PANEL
ALT: 14 U/L (ref 0–35)
AST: 19 U/L (ref 0–37)
Albumin: 3.2 g/dL — ABNORMAL LOW (ref 3.5–5.2)
Alkaline Phosphatase: 50 U/L (ref 39–117)
BUN: 20 mg/dL (ref 6–23)
CO2: 31 mEq/L (ref 19–32)
Calcium: 9.4 mg/dL (ref 8.4–10.5)
Chloride: 103 mEq/L (ref 96–112)
Creatinine, Ser: 1.04 mg/dL (ref 0.40–1.20)
GFR: 50.84 mL/min — ABNORMAL LOW (ref >60.00)
Glucose, Bld: 133 mg/dL — ABNORMAL HIGH (ref 70–99)
Potassium: 3.8 mEq/L (ref 3.5–5.1)
Sodium: 141 mEq/L (ref 135–145)
Total Bilirubin: 0.5 mg/dL (ref 0.2–1.2)
Total Protein: 6.1 g/dL (ref 6.0–8.3)

## 2018-08-30 LAB — HEMOGLOBIN A1C: Hgb A1c MFr Bld: 7.5 % — ABNORMAL HIGH (ref 4.6–6.5)

## 2018-08-30 LAB — TSH: TSH: 3.39 u[IU]/mL (ref 0.35–4.50)

## 2018-08-31 ENCOUNTER — Telehealth: Payer: Self-pay | Admitting: Family Medicine

## 2018-08-31 NOTE — Telephone Encounter (Signed)
I left a message on patient's voice mail to return my call.  Patient has a AWV with Dr.Tower on 09/05/18. Dr.Tower would like patient to switch appointment to [email or web address].  If patient refuses,patient can come in to office.

## 2018-09-05 ENCOUNTER — Encounter: Payer: Self-pay | Admitting: Family Medicine

## 2018-09-05 ENCOUNTER — Ambulatory Visit (INDEPENDENT_AMBULATORY_CARE_PROVIDER_SITE_OTHER): Payer: PPO | Admitting: Family Medicine

## 2018-09-05 VITALS — Ht 62.5 in | Wt 153.0 lb

## 2018-09-05 DIAGNOSIS — F418 Other specified anxiety disorders: Secondary | ICD-10-CM

## 2018-09-05 DIAGNOSIS — E038 Other specified hypothyroidism: Secondary | ICD-10-CM

## 2018-09-05 DIAGNOSIS — R7989 Other specified abnormal findings of blood chemistry: Secondary | ICD-10-CM | POA: Diagnosis not present

## 2018-09-05 DIAGNOSIS — E1169 Type 2 diabetes mellitus with other specified complication: Secondary | ICD-10-CM

## 2018-09-05 DIAGNOSIS — M810 Age-related osteoporosis without current pathological fracture: Secondary | ICD-10-CM | POA: Diagnosis not present

## 2018-09-05 DIAGNOSIS — I10 Essential (primary) hypertension: Secondary | ICD-10-CM

## 2018-09-05 DIAGNOSIS — E039 Hypothyroidism, unspecified: Secondary | ICD-10-CM

## 2018-09-05 DIAGNOSIS — E1165 Type 2 diabetes mellitus with hyperglycemia: Secondary | ICD-10-CM

## 2018-09-05 DIAGNOSIS — E785 Hyperlipidemia, unspecified: Secondary | ICD-10-CM

## 2018-09-05 MED ORDER — LOSARTAN POTASSIUM-HCTZ 100-25 MG PO TABS: 1.0000 | ORAL_TABLET | Freq: Every day | ORAL | 3 refills | Status: DC

## 2018-09-05 MED ORDER — AMLODIPINE BESYLATE 10 MG PO TABS: 10.0000 mg | ORAL_TABLET | Freq: Every day | ORAL | 3 refills | Status: DC

## 2018-09-05 MED ORDER — METOPROLOL SUCCINATE ER 25 MG PO TB24: 25.0000 mg | ORAL_TABLET | Freq: Every day | ORAL | 3 refills | Status: DC

## 2018-09-05 MED ORDER — GLIPIZIDE ER 10 MG PO TB24: 10.0000 mg | ORAL_TABLET | Freq: Every day | ORAL | 3 refills | Status: DC

## 2018-09-05 MED ORDER — SITAGLIPTIN PHOSPHATE 50 MG PO TABS: 50.0000 mg | ORAL_TABLET | Freq: Every day | ORAL | 3 refills | Status: DC

## 2018-09-05 MED ORDER — FLUOXETINE HCL 40 MG PO CAPS: 40.0000 mg | ORAL_CAPSULE | Freq: Every day | ORAL | 3 refills | Status: DC

## 2018-09-05 MED ORDER — METFORMIN HCL 1000 MG PO TABS: ORAL_TABLET | ORAL | 3 refills | Status: DC

## 2018-09-05 MED ORDER — BUPROPION HCL ER (XL) 300 MG PO TB24: 300.0000 mg | ORAL_TABLET | Freq: Every day | ORAL | 3 refills | Status: DC

## 2018-09-05 MED ORDER — SIMVASTATIN 20 MG PO TABS: 20.0000 mg | ORAL_TABLET | Freq: Every day | ORAL | 3 refills | Status: DC

## 2018-09-05 MED ORDER — RALOXIFENE HCL 60 MG PO TABS: 60.0000 mg | ORAL_TABLET | Freq: Every day | ORAL | 3 refills | Status: DC

## 2018-09-05 NOTE — Assessment & Plan Note (Signed)
Year 3 of evista and tolerates well  Declines dexa One fall/no fractures Taking ca and D

## 2018-09-05 NOTE — Assessment & Plan Note (Signed)
Relatively well controlled with wellbutrin and prozac right now Helps to get outside Reviewed stressors/ coping techniques/symptoms/ support sources/ tx options and side effects in detail today Has family-supportive

## 2018-09-05 NOTE — Progress Notes (Signed)
Virtual Visit via Video Note  I connected with Amanda Benson on 09/05/18 at 11:30 AM EDT by a video enabled telemedicine application and verified that I am speaking with the correct person using two identifiers.  Location: Patient: home Provider: office    I discussed the limitations of evaluation and management by telemedicine and the availability of in person appointments. The patient expressed understanding and agreed to proceed.  History of Present Illness: Here for annual f/u of chronic medical problems   Had amw on 5/26 Depression score of 4  No gaps or concerns  Breast cancer screening -declines mammogram  Self breast exam-no lumps or changes   dexa 2/14  Osteoporosis Declines another dexa Intolerant to fosamax and now tolerating evista well  No falls/fractures  Had a slide w/o fall 2 weeks ago -- using her walker and being careful  No h/o D def   Weight Wt Readings from Last 3 Encounters:  06/13/18 158 lb 5 oz (71.8 kg)  03/14/18 154 lb (69.9 kg)  12/19/17 154 lb (69.9 kg)  153 lb at home     Hypertension  No cp or palpitations or headaches or edema  No side effects to medicines  BP Readings from Last 3 Encounters:  06/13/18 135/60  03/14/18 (!) 148/80  12/19/17 (!) 160/72     Lab Results  Component Value Date   CREATININE 1.04 08/30/2018   BUN 20 08/30/2018   NA 141 08/30/2018   K 3.8 08/30/2018   CL 103 08/30/2018   CO2 31 08/30/2018   Lab Results  Component Value Date   ALT 14 08/30/2018   AST 19 08/30/2018   ALKPHOS 50 08/30/2018   BILITOT 0.5 08/30/2018    Lab Results  Component Value Date   WBC 7.0 08/30/2018   HGB 12.4 08/30/2018   HCT 36.5 08/30/2018   MCV 89.2 08/30/2018   PLT 317.0 08/30/2018   Lab Results  Component Value Date   TSH 3.39 08/30/2018  subclinical hypothyroid in the past   DM2 Diabetes Home sugar results - checks once in a while 120s-low 140s (daughter checks)  DM diet - follows some of the time  She  does eat ice cream at night before bed  Has cut out fast foods  Exercise -a little bit / walks with walker  Symptoms-none A1C last  Lab Results  Component Value Date   HGBA1C 7.5 (H) 08/30/2018  down from 9.1! Metformin and glipizide and januvia (tolerating well)  No problems with medications  Renal protection- ARB Last eye exam  12/19  Hyperlipidemia Lab Results  Component Value Date   CHOL 121 08/30/2018   CHOL 129 06/09/2018   CHOL 134 12/19/2017   Lab Results  Component Value Date   HDL 45.90 08/30/2018   HDL 48.10 06/09/2018   HDL 54.30 12/19/2017   Lab Results  Component Value Date   LDLCALC 48 08/30/2018   LDLCALC 47 12/19/2017   LDLCALC 54 05/30/2017   Lab Results  Component Value Date   TRIG 134.0 08/30/2018   TRIG 210.0 (H) 06/09/2018   TRIG 168.0 (H) 12/19/2017   Lab Results  Component Value Date   CHOLHDL 3 08/30/2018   CHOLHDL 3 06/09/2018   CHOLHDL 2 12/19/2017   Lab Results  Component Value Date   LDLDIRECT 59.0 06/09/2018   LDLDIRECT 107.2 09/19/2009   Simvastatin 20 mg  Diet -less fast food   Mood  H/o depression and anxiety  On prozac and wellbutrin  Review of Systems  Constitutional: Negative for chills, diaphoresis, fever and malaise/fatigue.  HENT: Positive for hearing loss. Negative for congestion and sore throat.   Eyes: Negative for blurred vision, discharge and redness.  Respiratory: Negative for cough and shortness of breath.   Cardiovascular: Negative for chest pain, palpitations, claudication and leg swelling.  Gastrointestinal: Negative for heartburn, nausea and vomiting.  Genitourinary: Negative for dysuria.  Musculoskeletal: Negative for myalgias.  Skin: Negative for itching and rash.  Neurological: Negative for dizziness and headaches.  Endo/Heme/Allergies: Negative for polydipsia.  Psychiatric/Behavioral: Negative for memory loss and suicidal ideas.       Anxiety and depression are stable    Patient Active  Problem List   Diagnosis Date Noted  . Elevated serum creatinine 06/13/2018  . Hearing loss 01/02/2018  . Subclinical hypothyroidism 10/26/2017  . Depression with anxiety 08/24/2016  . Falls frequently 08/24/2016  . Generalized weakness 08/24/2016  . Routine general medical examination at a health care facility 05/16/2015  . Breast calcifications on mammogram 03/01/2014  . Encounter for Medicare annual wellness exam 05/30/2013  . Breast calcification seen on mammogram 12/14/2012  . Breast calcification, left 11/28/2012  . History of falling 11/27/2012  . Poor balance 11/27/2012  . Colon cancer screening 05/10/2012  . Osteoporosis 06/21/2007  . DM (diabetes mellitus), type 2, uncontrolled ([place]) 09/20/2006  . Hyperlipidemia associated with type 2 diabetes mellitus ([place]) 09/20/2006  . Essential hypertension 09/20/2006  . Osteoarthritis 09/20/2006   Past Medical History:  Diagnosis Date  . Depression   . Diabetes mellitus    type II  . Hyperlipidemia   . Hypertension   . Obesity   . Osteoporosis    Past Surgical History:  Procedure Laterality Date  . ANKLE SURGERY     left medial malleblus fracture  . CHOLECYSTECTOMY     Social History   Tobacco Use  . Smoking status: Never Smoker  . Smokeless tobacco: Never Used  Substance Use Topics  . Alcohol use: No    Alcohol/week: 0.0 standard drinks  . Drug use: No   Family History  Problem Relation Age of Onset  . Diabetes Mother   . Hypertension Mother   . Asthma Father   . Hypertension Sister   . Cancer Sister        lung  . Asthma Brother   . COPD Brother   . Cancer Maternal Grandmother        breast   . Asthma Brother    Allergies  Allergen Reactions  . Alendronate Sodium     REACTION: heartburn   Current Outpatient Medications on File Prior to Visit  Medication Sig Dispense Refill  . aspirin 81 MG EC tablet Take 81 mg by mouth daily.      . Blood Glucose Monitoring Suppl (ONE TOUCH ULTRA SYSTEM KIT)  W/DEVICE KIT To check sugar daily and as needed for DM2 250.0  1 each 0  . glucose blood (ONE TOUCH ULTRA TEST) test strip Use to check blood sugar once daily (Dx. E11.9) 100 each 1   No current facility-administered medications on file prior to visit.     Observations/Objective: Patient appears well, in no distress Weight is baseline  No facial swelling or asymmetry Normal voice-not hoarse and no slurred speech No obvious tremor/ sitting for visit  Moving neck and UEs normally Some difficulty hearing the call/her daughter helps translate for her  No cough or shortness of breath during interview  Talkative and mentally sharp with no cognitive  changes No skin changes on face or neck , no rash or pallor-very tanned  Affect is normal  (more cheerful than usual today and w/o complaint)  Assessment and Plan: Problem List Items Addressed This Visit      Cardiovascular and Mediastinum   Essential hypertension    Pt has not checked bp at home/ but suspects it is stable It was controlled in march Will continue current medicines       Relevant Medications   amLODipine (NORVASC) 10 MG tablet   losartan-hydrochlorothiazide (HYZAAR) 100-25 MG tablet   simvastatin (ZOCOR) 20 MG tablet   metoprolol succinate (TOPROL-XL) 25 MG 24 hr tablet   Other Relevant Orders   Renal function panel     Endocrine   DM (diabetes mellitus), type 2, uncontrolled ([place]) - Primary    Lab Results  Component Value Date   HGBA1C 7.5 (H) 08/30/2018   Much improved with januvia 50 mg  Also some wt loss Enc DM diet  On arb and statin  Re check A1c in 3 mo -goal is below 7      Relevant Medications   glipiZIDE (GLUCOTROL XL) 10 MG 24 hr tablet   metFORMIN (GLUCOPHAGE) 1000 MG tablet   losartan-hydrochlorothiazide (HYZAAR) 100-25 MG tablet   simvastatin (ZOCOR) 20 MG tablet   sitaGLIPtin (JANUVIA) 50 MG tablet   Other Relevant Orders   Hemoglobin A1c   Renal function panel   Hyperlipidemia associated  with type 2 diabetes mellitus (H[facility])    Disc goals for lipids and reasons to control them Rev last labs with pt Rev low sat fat diet in detail Controlled with statin  Triglycerides are improved with better glucose control also Continues simvastatin       Relevant Medications   glipiZIDE (GLUCOTROL XL) 10 MG 24 hr tablet   metFORMIN (GLUCOPHAGE) 1000 MG tablet   losartan-hydrochlorothiazide (HYZAAR) 100-25 MG tablet   simvastatin (ZOCOR) 20 MG tablet   sitaGLIPtin (JANUVIA) 50 MG tablet   Subclinical hypothyroidism    This visit TSH is in nl range Lab Results  Component Value Date   TSH 3.39 08/30/2018   No clinical changes      Relevant Medications   metoprolol succinate (TOPROL-XL) 25 MG 24 hr tablet     Musculoskeletal and Integument   Osteoporosis    Year 3 of evista and tolerates well  Declines dexa One fall/no fractures Taking ca and D      Relevant Medications   raloxifene (EVISTA) 60 MG tablet     Other   Depression with anxiety    Relatively well controlled with wellbutrin and prozac right now Helps to get outside Reviewed stressors/ coping techniques/symptoms/ support sources/ tx options and side effects in detail today Has family-supportive      Relevant Medications   buPROPion (WELLBUTRIN XL) 300 MG 24 hr tablet   FLUoxetine (PROZAC) 40 MG capsule   Elevated serum creatinine    Improved with no uti  Enc fluids          Follow Up Instructions: Labs are fairly stable and diabetes is improved significantly with [place]  Continue current medicines  My goal is A1C below 7  I want to check A1C and renal panel in 3 months-the office will call you to set that up  Work on better diet for diabetes (low in fat and sugar)  Thyroid number is in normal range I'm glad your mood is better  Try and stay active (safely with walker) and be  careful not to fall  Continue calcium and vitamin D for bone health   If you decide you want a mammogram or bone density  test please let me know   I will likely have you follow up in 6 months depending on how labs are in 3 months   I discussed the assessment and treatment plan with the patient. The patient was provided an opportunity to ask questions and all were answered. The patient agreed with the plan and demonstrated an understanding of the instructions.   The patient was advised to call back or seek an in-person evaluation if the symptoms worsen or if the condition fails to improve as anticipated.     Loura Pardon, MD

## 2018-09-05 NOTE — Assessment & Plan Note (Signed)
This visit TSH is in nl range Lab Results  Component Value Date   TSH 3.39 08/30/2018   No clinical changes

## 2018-09-05 NOTE — Assessment & Plan Note (Signed)
Lab Results  Component Value Date   HGBA1C 7.5 (H) 08/30/2018   Much improved with januvia 50 mg  Also some wt loss Enc DM diet  On arb and statin  Re check A1c in 3 mo -goal is below 7

## 2018-09-05 NOTE — Assessment & Plan Note (Signed)
Disc goals for lipids and reasons to control them Rev last labs with pt Rev low sat fat diet in detail Controlled with statin  Triglycerides are improved with better glucose control also Continues simvastatin

## 2018-09-05 NOTE — Assessment & Plan Note (Signed)
Improved with no uti  Enc fluids

## 2018-09-05 NOTE — Assessment & Plan Note (Signed)
Pt has not checked bp at home/ but suspects it is stable It was controlled in march Will continue current medicines

## 2018-09-05 NOTE — Patient Instructions (Signed)
Labs are fairly stable and diabetes is improved significantly with [place]  Continue current medicines  My goal is A1C below 7  I want to check A1C and renal panel in 3 months-the office will call you to set that up  Work on better diet for diabetes (low in fat and sugar)  Thyroid number is in normal range I'm glad your mood is better  Try and stay active (safely with walker) and be careful not to fall  Continue calcium and vitamin D for bone health   If you decide you want a mammogram or bone density test please let me know   I will likely have you follow up in 6 months depending on how labs are in 3 months

## 2018-09-11 ENCOUNTER — Other Ambulatory Visit: Payer: PPO

## 2018-09-13 ENCOUNTER — Ambulatory Visit: Payer: PPO | Admitting: Family Medicine

## 2018-11-14 ENCOUNTER — Other Ambulatory Visit: Payer: Self-pay | Admitting: Family Medicine

## 2018-12-01 ENCOUNTER — Telehealth: Payer: Self-pay

## 2018-12-01 NOTE — Telephone Encounter (Signed)
Left detailed VM w COVID screen and back door lab info   

## 2018-12-05 ENCOUNTER — Other Ambulatory Visit (INDEPENDENT_AMBULATORY_CARE_PROVIDER_SITE_OTHER): Payer: PPO

## 2018-12-05 DIAGNOSIS — E1165 Type 2 diabetes mellitus with hyperglycemia: Secondary | ICD-10-CM | POA: Diagnosis not present

## 2018-12-05 DIAGNOSIS — I10 Essential (primary) hypertension: Secondary | ICD-10-CM

## 2018-12-05 LAB — RENAL FUNCTION PANEL
Albumin: 3.3 g/dL — ABNORMAL LOW (ref 3.5–5.2)
BUN: 21 mg/dL (ref 6–23)
CO2: 32 mEq/L (ref 19–32)
Calcium: 8.9 mg/dL (ref 8.4–10.5)
Chloride: 102 mEq/L (ref 96–112)
Creatinine, Ser: 1.16 mg/dL (ref 0.40–1.20)
GFR: 44.79 mL/min — ABNORMAL LOW (ref >60.00)
Glucose, Bld: 163 mg/dL — ABNORMAL HIGH (ref 70–99)
Phosphorus: 2.4 mg/dL (ref 2.3–4.6)
Potassium: 4.3 mEq/L (ref 3.5–5.1)
Sodium: 141 mEq/L (ref 135–145)

## 2018-12-05 LAB — HEMOGLOBIN A1C: Hgb A1c MFr Bld: 7.6 % — ABNORMAL HIGH (ref 4.6–6.5)

## 2018-12-06 ENCOUNTER — Encounter: Payer: Self-pay | Admitting: *Deleted

## 2019-01-08 ENCOUNTER — Other Ambulatory Visit: Payer: Self-pay | Admitting: Family Medicine

## 2019-03-27 ENCOUNTER — Ambulatory Visit (INDEPENDENT_AMBULATORY_CARE_PROVIDER_SITE_OTHER): Payer: PPO | Admitting: Family Medicine

## 2019-03-27 ENCOUNTER — Encounter: Payer: Self-pay | Admitting: Family Medicine

## 2019-03-27 ENCOUNTER — Other Ambulatory Visit: Payer: Self-pay

## 2019-03-27 VITALS — BP 140/65 | HR 65 | Temp 96.1°F | Ht 62.5 in | Wt 151.3 lb

## 2019-03-27 DIAGNOSIS — R2689 Other abnormalities of gait and mobility: Secondary | ICD-10-CM | POA: Diagnosis not present

## 2019-03-27 DIAGNOSIS — F4321 Adjustment disorder with depressed mood: Secondary | ICD-10-CM | POA: Diagnosis not present

## 2019-03-27 DIAGNOSIS — R531 Weakness: Secondary | ICD-10-CM | POA: Diagnosis not present

## 2019-03-27 DIAGNOSIS — R7989 Other specified abnormal findings of blood chemistry: Secondary | ICD-10-CM

## 2019-03-27 DIAGNOSIS — I10 Essential (primary) hypertension: Secondary | ICD-10-CM | POA: Diagnosis not present

## 2019-03-27 DIAGNOSIS — E1165 Type 2 diabetes mellitus with hyperglycemia: Secondary | ICD-10-CM

## 2019-03-27 DIAGNOSIS — E1169 Type 2 diabetes mellitus with other specified complication: Secondary | ICD-10-CM

## 2019-03-27 DIAGNOSIS — E785 Hyperlipidemia, unspecified: Secondary | ICD-10-CM

## 2019-03-27 DIAGNOSIS — Z23 Encounter for immunization: Secondary | ICD-10-CM

## 2019-03-27 NOTE — Patient Instructions (Addendum)
Keep me posted re: falls and balance  Walk with walker supervised  Stay as active as you can  Look into chair exercise programs   Put yourself on a bathroom schedule for urination  Set reminder to urinate every 2 hours to empty bladder so you don't loose continence as much  Use bedside commode   Flu shot today  Labs today   Please watch diet for sugar  Eat regular meals

## 2019-03-27 NOTE — Assessment & Plan Note (Signed)
Poor balance and falls  Family is working with her  Symmetric leg weakness Pt declines PT or other intervention

## 2019-03-27 NOTE — Assessment & Plan Note (Signed)
Ongoing- has had PT multiple times and uses walker at home  Denies dizziness Hesitant to exercise-family is working on this  Offered further eval/ repeat PT for strength and gait training and she declined Discussed safety measures in the home Disc need for walker at all times in and out of the home

## 2019-03-27 NOTE — Assessment & Plan Note (Signed)
Recently lost daughter to copd Has good family support

## 2019-03-27 NOTE — Assessment & Plan Note (Signed)
Disc goals for lipids and reasons to control them Rev last labs with pt Rev low sat fat diet in detail Continues simvastatin  

## 2019-03-27 NOTE — Assessment & Plan Note (Signed)
GFR was up last visit Drinking more fluids now  Will re check today

## 2019-03-27 NOTE — Assessment & Plan Note (Signed)
Labs today Per family- glucose readings are overall stable to improved Has been out of [place] 2 wk due to donut hole disc imp of low glycemic diet and wt loss to prevent DM2  On statin and arb

## 2019-03-27 NOTE — Assessment & Plan Note (Signed)
bp in fair control at this time  BP Readings from Last 1 Encounters:  03/27/19 140/65   No changes needed Most recent labs reviewed  Disc lifstyle change with low sodium diet and exercise

## 2019-03-27 NOTE — Progress Notes (Signed)
Subjective:    Patient ID: Amanda Benson, female    DOB: Sep 27, 1935, 83 y.o.   MRN: [id number]  This visit occurred during the SARS-CoV-2 public health emergency.  Safety protocols were in place, including screening questions prior to the visit, additional usage of staff PPE, and extensive cleaning of exam room while observing appropriate contact time as indicated for disinfecting solutions.    HPI Pt presents for 3 mo f/u of chronic health problems and for a flu shot   Daughter died of copd in 02-Dec-2022- having a hard time with that  Has not been a good year  Talks to the family - they are supportive  Does not want counseling   Physically - "not good"  Per daughter-actually doing better this month  Has fallen over 14 times this year  Uses a walker at home Daughter has a plan to work on that  Holding off on PT for now   Has not done much of anything at home  Using the walker to get around  Not much exercise  Family is around 24 hours a day-she is not left alone   No dizziness- just poor balance    Wt Readings from Last 3 Encounters:  03/27/19 151 lb 5 oz (68.6 kg)  09/05/18 153 lb (69.4 kg)  06/13/18 158 lb 5 oz (71.8 kg)   27.23 kg/m Appetite is up and down  Portions are small   Hypertension  bp is stable today -always higher on the first check  No cp or palpitations or headaches or edema  No side effects to medicines  BP Readings from Last 3 Encounters:  03/27/19 (!) 148/72  06/13/18 135/60  03/14/18 (!) 148/80       Diabetes 2 Lab Results  Component Value Date   HGBA1C 7.6 (H) 12/05/2018  due for labs  Past value was stable from 7.5  Glucose at home ranges from 96 - 140s Few over 200 (too soon after eating)   Metformin and glipizide Had to stop [place] in the gap/donut hole =- hopefully will be back in January  Last dose 2 weeks ago   Still drinks regular cokes one bottle per day   Some incontinence of urine         Cr was up last check  also (GFR 44.7) Lab Results  Component Value Date   CREATININE 1.16 12/05/2018   BUN 21 12/05/2018   NA 141 12/05/2018   K 4.3 12/05/2018   CL 102 12/05/2018   CO2 32 12/05/2018  drinking more water  Also sugar free lemonade     On arb and statin   Hyperlipidemia Lab Results  Component Value Date   CHOL 121 08/30/2018   HDL 45.90 08/30/2018   LDLCALC 48 08/30/2018   LDLDIRECT 59.0 06/09/2018   TRIG 134.0 08/30/2018   CHOLHDL 3 08/30/2018   Simvastatin and diet   Patient Active Problem List   Diagnosis Date Noted  . Grief reaction 03/27/2019  . Elevated serum creatinine 06/13/2018  . Hearing loss 01/02/2018  . Subclinical hypothyroidism 10/26/2017  . Depression with anxiety 08/24/2016  . Falls frequently 08/24/2016  . Generalized weakness 08/24/2016  . Routine general medical examination at a health care facility 05/16/2015  . Breast calcifications on mammogram 03/01/2014  . Encounter for Medicare annual wellness exam 05/30/2013  . Breast calcification seen on mammogram 12/14/2012  . Breast calcification, left 11/28/2012  . History of falling 11/27/2012  . Poor balance 11/27/2012  . Colon  cancer screening 05/10/2012  . Osteoporosis 06/21/2007  . DM (diabetes mellitus), type 2, uncontrolled ([place]) 09/20/2006  . Hyperlipidemia associated with type 2 diabetes mellitus ([place]) 09/20/2006  . Essential hypertension 09/20/2006  . Osteoarthritis 09/20/2006   Past Medical History:  Diagnosis Date  . Depression   . Diabetes mellitus    type II  . Hyperlipidemia   . Hypertension   . Obesity   . Osteoporosis    Past Surgical History:  Procedure Laterality Date  . ANKLE SURGERY     left medial malleblus fracture  . CHOLECYSTECTOMY     Social History   Tobacco Use  . Smoking status: Never Smoker  . Smokeless tobacco: Never Used  Substance Use Topics  . Alcohol use: No    Alcohol/week: 0.0 standard drinks  . Drug use: No   Family History  Problem Relation  Age of Onset  . Diabetes Mother   . Hypertension Mother   . Asthma Father   . Hypertension Sister   . Cancer Sister        lung  . Asthma Brother   . COPD Brother   . Cancer Maternal Grandmother        breast   . Asthma Brother   . Diabetes Mellitus II Daughter   . COPD Daughter    Allergies  Allergen Reactions  . Alendronate Sodium     REACTION: heartburn   Current Outpatient Medications on File Prior to Visit  Medication Sig Dispense Refill  . amLODipine (NORVASC) 10 MG tablet Take 1 tablet (10 mg total) by mouth daily. 90 tablet 3  . aspirin 81 MG EC tablet Take 81 mg by mouth daily.      . Blood Glucose Monitoring Suppl (ONE TOUCH ULTRA SYSTEM KIT) W/DEVICE KIT To check sugar daily and as needed for DM2 250.0  1 each 0  . buPROPion (WELLBUTRIN XL) 300 MG 24 hr tablet Take 1 tablet (300 mg total) by mouth daily. 90 tablet 3  . FLUoxetine (PROZAC) 40 MG capsule Take 1 capsule (40 mg total) by mouth daily. 90 capsule 3  . glipiZIDE (GLUCOTROL XL) 10 MG 24 hr tablet Take 1 tablet (10 mg total) by mouth daily. 90 tablet 3  . glucose blood (ONE TOUCH ULTRA TEST) test strip Use to check blood sugar once daily (Dx. E11.9) 100 each 1  . losartan-hydrochlorothiazide (HYZAAR) 100-25 MG tablet Take 1 tablet by mouth daily. 90 tablet 3  . metFORMIN (GLUCOPHAGE) 1000 MG tablet TAKE 1 TABLET(1000 MG) BY MOUTH TWICE DAILY WITH A MEAL 180 tablet 3  . metoprolol succinate (TOPROL-XL) 25 MG 24 hr tablet Take 1 tablet (25 mg total) by mouth daily. 90 tablet 3  . raloxifene (EVISTA) 60 MG tablet Take 1 tablet (60 mg total) by mouth daily. 90 tablet 3  . simvastatin (ZOCOR) 20 MG tablet Take 1 tablet (20 mg total) by mouth daily. 90 tablet 3  . sitaGLIPtin (JANUVIA) 50 MG tablet Take 1 tablet (50 mg total) by mouth daily. 90 tablet 3   No current facility-administered medications on file prior to visit.     Review of Systems  Constitutional: Negative for activity change, appetite change,  fatigue, fever and unexpected weight change.  HENT: Negative for congestion, ear pain, rhinorrhea, sinus pressure and sore throat.   Eyes: Negative for pain, redness and visual disturbance.  Respiratory: Negative for cough, shortness of breath and wheezing.   Cardiovascular: Negative for chest pain and palpitations.  Gastrointestinal: Negative for abdominal  pain, blood in stool, constipation and diarrhea.  Endocrine: Negative for polydipsia and polyuria.  Genitourinary: Negative for dysuria, frequency and urgency.       Incontinence urge  Musculoskeletal: Positive for back pain and gait problem. Negative for arthralgias and myalgias.  Skin: Negative for pallor and rash.  Allergic/Immunologic: Negative for environmental allergies.  Neurological: Positive for weakness. Negative for dizziness, syncope and headaches.       Poor balance Falls   Hematological: Negative for adenopathy. Does not bruise/bleed easily.  Psychiatric/Behavioral: Positive for dysphoric mood. Negative for decreased concentration. The patient is nervous/anxious.        Objective:   Physical Exam Constitutional:      General: She is not in acute distress.    Appearance: She is well-developed and normal weight. She is not ill-appearing or diaphoretic.  HENT:     Head: Normocephalic and atraumatic.  Eyes:     Conjunctiva/sclera: Conjunctivae normal.     Pupils: Pupils are equal, round, and reactive to light.  Neck:     Thyroid: No thyromegaly.     Vascular: No carotid bruit or JVD.  Cardiovascular:     Rate and Rhythm: Normal rate and regular rhythm.     Heart sounds: Normal heart sounds. No gallop.   Pulmonary:     Effort: Pulmonary effort is normal. No respiratory distress.     Breath sounds: Normal breath sounds. No wheezing or rales.  Abdominal:     General: Bowel sounds are normal. There is no distension or abdominal bruit.     Palpations: Abdomen is soft. There is no mass.     Tenderness: There is no  abdominal tenderness.  Musculoskeletal:     Cervical back: Normal range of motion and neck supple.     Right lower leg: No edema.     Left lower leg: No edema.  Lymphadenopathy:     Cervical: No cervical adenopathy.  Skin:    General: Skin is warm and dry.     Coloration: Skin is not pale.     Findings: No rash.  Neurological:     Mental Status: She is alert. Mental status is at baseline.     Sensory: No sensory deficit.     Coordination: Coordination abnormal.     Gait: Gait abnormal.     Deep Tendon Reflexes: Reflexes are normal and symmetric. Reflexes normal.     Comments: Generalized weakness-in legs especially  Cannot walk w/o assistance  Psychiatric:        Mood and Affect: Mood is depressed. Affect is not tearful.        Cognition and Memory: Cognition and memory normal.           Assessment & Plan:   Problem List Items Addressed This Visit      Cardiovascular and Mediastinum   Essential hypertension    bp in fair control at this time  BP Readings from Last 1 Encounters:  03/27/19 140/65   No changes needed Most recent labs reviewed  Disc lifstyle change with low sodium diet and exercise        Relevant Orders   Comprehensive metabolic panel     Endocrine   DM (diabetes mellitus), type 2, uncontrolled ([place]) - Primary    Labs today Per family- glucose readings are overall stable to improved Has been out of januvia 2 wk due to donut hole disc imp of low glycemic diet and wt loss to prevent DM2  On statin and arb  Relevant Orders   Hemoglobin A1c   Hyperlipidemia associated with type 2 diabetes mellitus ([place])    Disc goals for lipids and reasons to control them Rev last labs with pt Rev low sat fat diet in detail Continues simvastatin       Relevant Orders   Lipid panel     Other   Poor balance    Ongoing- has had PT multiple times and uses walker at home  Denies dizziness Hesitant to exercise-family is working on this  Offered  further eval/ repeat PT for strength and gait training and she declined Discussed safety measures in the home Disc need for walker at all times in and out of the home      Generalized weakness    Poor balance and falls  Family is working with her  Symmetric leg weakness Pt declines PT or other intervention      Elevated serum creatinine    GFR was up last visit Drinking more fluids now  Will re check today      Grief reaction    Recently lost daughter to copd Has good family support         Other Visit Diagnoses    Need for influenza vaccination       Relevant Orders   Flu Vaccine QUAD High Dose(Fluad) (Completed)

## 2019-03-28 ENCOUNTER — Encounter: Payer: Self-pay | Admitting: *Deleted

## 2019-03-28 LAB — LIPID PANEL
Cholesterol: 129 mg/dL (ref 0–200)
HDL: 48.7 mg/dL (ref >39.00)
LDL Cholesterol: 48 mg/dL (ref 0–99)
NonHDL: 80.27
Total CHOL/HDL Ratio: 3
Triglycerides: 161 mg/dL — ABNORMAL HIGH (ref 0.0–149.0)
VLDL: 32.2 mg/dL (ref 0.0–40.0)

## 2019-03-28 LAB — COMPREHENSIVE METABOLIC PANEL
ALT: 17 U/L (ref 0–35)
AST: 17 U/L (ref 0–37)
Albumin: 3.8 g/dL (ref 3.5–5.2)
Alkaline Phosphatase: 59 U/L (ref 39–117)
BUN: 26 mg/dL — ABNORMAL HIGH (ref 6–23)
CO2: 25 mEq/L (ref 19–32)
Calcium: 10.6 mg/dL — ABNORMAL HIGH (ref 8.4–10.5)
Chloride: 102 mEq/L (ref 96–112)
Creatinine, Ser: 1.26 mg/dL — ABNORMAL HIGH (ref 0.40–1.20)
GFR: 40.68 mL/min — ABNORMAL LOW (ref >60.00)
Glucose, Bld: 164 mg/dL — ABNORMAL HIGH (ref 70–99)
Potassium: 3.7 mEq/L (ref 3.5–5.1)
Sodium: 141 mEq/L (ref 135–145)
Total Bilirubin: 0.4 mg/dL (ref 0.2–1.2)
Total Protein: 6.8 g/dL (ref 6.0–8.3)

## 2019-03-28 LAB — HEMOGLOBIN A1C: Hgb A1c MFr Bld: 7.3 % — ABNORMAL HIGH (ref 4.6–6.5)

## 2019-07-03 ENCOUNTER — Ambulatory Visit: Payer: PPO | Admitting: Family Medicine

## 2019-07-11 ENCOUNTER — Encounter: Payer: Self-pay | Admitting: Family Medicine

## 2019-07-11 ENCOUNTER — Ambulatory Visit (INDEPENDENT_AMBULATORY_CARE_PROVIDER_SITE_OTHER): Payer: PPO | Admitting: Family Medicine

## 2019-07-11 ENCOUNTER — Other Ambulatory Visit: Payer: Self-pay

## 2019-07-11 VITALS — BP 156/68 | HR 74 | Temp 97.6°F | Ht 62.5 in | Wt 145.4 lb

## 2019-07-11 DIAGNOSIS — E785 Hyperlipidemia, unspecified: Secondary | ICD-10-CM | POA: Diagnosis not present

## 2019-07-11 DIAGNOSIS — R300 Dysuria: Secondary | ICD-10-CM | POA: Diagnosis not present

## 2019-07-11 DIAGNOSIS — E1165 Type 2 diabetes mellitus with hyperglycemia: Secondary | ICD-10-CM

## 2019-07-11 DIAGNOSIS — R8290 Unspecified abnormal findings in urine: Secondary | ICD-10-CM

## 2019-07-11 DIAGNOSIS — E1169 Type 2 diabetes mellitus with other specified complication: Secondary | ICD-10-CM

## 2019-07-11 DIAGNOSIS — I10 Essential (primary) hypertension: Secondary | ICD-10-CM | POA: Diagnosis not present

## 2019-07-11 LAB — POC URINALSYSI DIPSTICK (AUTOMATED)
Bilirubin, UA: 1
Blood, UA: NEGATIVE
Glucose, UA: NEGATIVE
Ketones, UA: 5
Nitrite, UA: NEGATIVE
Protein, UA: POSITIVE — AB
Spec Grav, UA: >=1.03 — AB (ref 1.010–1.025)
Urobilinogen, UA: 0.2 E.U./dL
pH, UA: 6 (ref 5.0–8.0)

## 2019-07-11 LAB — POCT GLYCOSYLATED HEMOGLOBIN (HGB A1C): Hemoglobin A1C: 6.9 % — AB (ref 4.0–5.6)

## 2019-07-11 NOTE — Assessment & Plan Note (Signed)
Pos UA -pending culture will tx His may worsen her incontinence Enc more fluid intake

## 2019-07-11 NOTE — Assessment & Plan Note (Signed)
Controlled with statin in setting of DM  Disc goals for lipids and reasons to control them Rev last labs with pt Rev low sat fat diet in detail

## 2019-07-11 NOTE — Assessment & Plan Note (Signed)
Improvement now back on januvia Lab Results  Component Value Date   HGBA1C 6.9 (A) 07/11/2019   Plans to get eye exam (family will schedule) More active Enc low glycemic diet  On arb and statin

## 2019-07-11 NOTE — Assessment & Plan Note (Signed)
bp is better on 2nd check generally  No change in tx

## 2019-07-11 NOTE — Patient Instructions (Addendum)
Please call and get your diabetic eye exam and make sure they send [place] a copy   A1C is improved  Keep working on diet and good fluid intake   Leave a urine sample on the way out and we will call you with a result   Stay as active as you can be   Please think about getting covid vaccines   Follow up in 6 months for annual exam

## 2019-07-11 NOTE — Progress Notes (Signed)
Subjective:    Patient ID: Amanda Benson, female    DOB: 1936-02-02, 84 y.o.   MRN: [id number]  This visit occurred during the SARS-CoV-2 public health emergency.  Safety protocols were in place, including screening questions prior to the visit, additional usage of staff PPE, and extensive cleaning of exam room while observing appropriate contact time as indicated for disinfecting solutions.    HPI  Pt presents for f/u of chronic health problems   Wt Readings from Last 3 Encounters:  07/11/19 145 lb 7 oz (66 kg)  03/27/19 151 lb 5 oz (68.6 kg)  09/05/18 153 lb (69.4 kg)  down 6 lbs  26.18 kg/m   Getting outdoors with better weather   Urinary incontinence worse as she gets older  Leaks with cough/sneeze  Also urge incontinence issues  A little pain to urinate for a few weeks  No blood in urine  Not feverish   ua today is abnormal Results for orders placed or performed in visit on 07/11/19  POCT glycosylated hemoglobin (Hb A1C)  Result Value Ref Range   Hemoglobin A1C 6.9 (A) 4.0 - 5.6 %   HbA1c POC (<> result, manual entry)     HbA1c, POC (prediabetic range)     HbA1c, POC (controlled diabetic range)    POCT Urinalysis Dipstick (Automated)  Result Value Ref Range   Color, UA Amber    Clarity, UA Cloudy    Glucose, UA Negative Negative   Bilirubin, UA 1 mg/dL    Ketones, UA 5 mg/dL    Spec Grav, UA >=1.030 (A) 1.010 - 1.025   Blood, UA Negative    pH, UA 6.0 5.0 - 8.0   Protein, UA Positive (A) Negative   Urobilinogen, UA 0.2 0.2 or 1.0 E.U./dL   Nitrite, UA Negative    Leukocytes, UA Large (3+) (A) Negative      bp is stable today  No cp or palpitations or headaches or edema  No side effects to medicines  BP Readings from Last 3 Encounters:  07/11/19 (!) 156/68  03/27/19 140/65  06/13/18 135/60     Amlodipine  Losartan hctz Metoprolol No problems at home  Always better on re check    Lab Results  Component Value Date   CREATININE 1.26 (H)  03/27/2019   BUN 26 (H) 03/27/2019   NA 141 03/27/2019   K 3.7 03/27/2019   CL 102 03/27/2019   CO2 25 03/27/2019  inst to inc her water /fluid intake to 64 oz per day  She is drinking more    DM2 Lab Results  Component Value Date   HGBA1C 7.3 (H) 03/27/2019  at that time had missed 2 wk of januvia (donut hole)  Due for A1C today Lab Results  Component Value Date   HGBA1C 6.9 (A) 07/11/2019   Better today   Glucose was 199 at 10 am today  Has not been checking regularly   [place] -back on  Glipizide  Metformin   Eats less in general than she used to  Is mindful about sugar   Takes statin and arb   Lab Results  Component Value Date   CHOL 129 03/27/2019   HDL 48.70 03/27/2019   LDLCALC 48 03/27/2019   LDLDIRECT 59.0 06/09/2018   TRIG 161.0 (H) 03/27/2019   CHOLHDL 3 03/27/2019   Taking statin   Patient Active Problem List   Diagnosis Date Noted  . Grief reaction 03/27/2019  . Elevated serum creatinine 06/13/2018  .  Dysuria 06/13/2018  . Hearing loss 01/02/2018  . Subclinical hypothyroidism 10/26/2017  . Depression with anxiety 08/24/2016  . Falls frequently 08/24/2016  . Generalized weakness 08/24/2016  . Routine general medical examination at a health care facility 05/16/2015  . Breast calcifications on mammogram 03/01/2014  . Encounter for Medicare annual wellness exam 05/30/2013  . Breast calcification seen on mammogram 12/14/2012  . Breast calcification, left 11/28/2012  . History of falling 11/27/2012  . Poor balance 11/27/2012  . Colon cancer screening 05/10/2012  . Osteoporosis 06/21/2007  . DM (diabetes mellitus), type 2, uncontrolled ([place]) 09/20/2006  . Hyperlipidemia associated with type 2 diabetes mellitus ([place]) 09/20/2006  . Essential hypertension 09/20/2006  . Osteoarthritis 09/20/2006   Past Medical History:  Diagnosis Date  . Depression   . Diabetes mellitus    type II  . Hyperlipidemia   . Hypertension   . Obesity   .  Osteoporosis    Past Surgical History:  Procedure Laterality Date  . ANKLE SURGERY     left medial malleblus fracture  . CHOLECYSTECTOMY     Social History   Tobacco Use  . Smoking status: Never Smoker  . Smokeless tobacco: Never Used  Substance Use Topics  . Alcohol use: No    Alcohol/week: 0.0 standard drinks  . Drug use: No   Family History  Problem Relation Age of Onset  . Diabetes Mother   . Hypertension Mother   . Asthma Father   . Hypertension Sister   . Cancer Sister        lung  . Asthma Brother   . COPD Brother   . Cancer Maternal Grandmother        breast   . Asthma Brother   . Diabetes Mellitus II Daughter   . COPD Daughter    Allergies  Allergen Reactions  . Alendronate Sodium     REACTION: heartburn   Current Outpatient Medications on File Prior to Visit  Medication Sig Dispense Refill  . amLODipine (NORVASC) 10 MG tablet Take 1 tablet (10 mg total) by mouth daily. 90 tablet 3  . aspirin 81 MG EC tablet Take 81 mg by mouth daily.      . Blood Glucose Monitoring Suppl (ONE TOUCH ULTRA SYSTEM KIT) W/DEVICE KIT To check sugar daily and as needed for DM2 250.0  1 each 0  . buPROPion (WELLBUTRIN XL) 300 MG 24 hr tablet Take 1 tablet (300 mg total) by mouth daily. 90 tablet 3  . FLUoxetine (PROZAC) 40 MG capsule Take 1 capsule (40 mg total) by mouth daily. 90 capsule 3  . glipiZIDE (GLUCOTROL XL) 10 MG 24 hr tablet Take 1 tablet (10 mg total) by mouth daily. 90 tablet 3  . glucose blood (ONE TOUCH ULTRA TEST) test strip Use to check blood sugar once daily (Dx. E11.9) 100 each 1  . losartan-hydrochlorothiazide (HYZAAR) 100-25 MG tablet Take 1 tablet by mouth daily. 90 tablet 3  . metFORMIN (GLUCOPHAGE) 1000 MG tablet TAKE 1 TABLET(1000 MG) BY MOUTH TWICE DAILY WITH A MEAL 180 tablet 3  . metoprolol succinate (TOPROL-XL) 25 MG 24 hr tablet Take 1 tablet (25 mg total) by mouth daily. 90 tablet 3  . raloxifene (EVISTA) 60 MG tablet Take 1 tablet (60 mg total)  by mouth daily. 90 tablet 3  . simvastatin (ZOCOR) 20 MG tablet Take 1 tablet (20 mg total) by mouth daily. 90 tablet 3  . sitaGLIPtin (JANUVIA) 50 MG tablet Take 1 tablet (50 mg total) by  mouth daily. 90 tablet 3   No current facility-administered medications on file prior to visit.    Review of Systems  Constitutional: Negative for activity change, appetite change, fatigue, fever and unexpected weight change.  HENT: Negative for congestion, ear pain, rhinorrhea, sinus pressure and sore throat.   Eyes: Negative for pain, redness and visual disturbance.  Respiratory: Negative for cough, shortness of breath and wheezing.   Cardiovascular: Negative for chest pain and palpitations.  Gastrointestinal: Negative for abdominal pain, blood in stool, constipation and diarrhea.  Endocrine: Negative for polydipsia and polyuria.  Genitourinary: Positive for dysuria and frequency. Negative for hematuria and urgency.  Musculoskeletal: Negative for arthralgias, back pain and myalgias.  Skin: Negative for pallor and rash.  Allergic/Immunologic: Negative for environmental allergies.  Neurological: Negative for dizziness, syncope and headaches.  Hematological: Negative for adenopathy. Does not bruise/bleed easily.  Psychiatric/Behavioral: Negative for decreased concentration and dysphoric mood. The patient is not nervous/anxious.        Objective:   Physical Exam Constitutional:      General: She is not in acute distress.    Appearance: Normal appearance. She is well-developed and normal weight. She is not ill-appearing.  HENT:     Head: Normocephalic and atraumatic.     Mouth/Throat:     Mouth: Mucous membranes are moist.  Eyes:     General: No scleral icterus.    Conjunctiva/sclera: Conjunctivae normal.     Pupils: Pupils are equal, round, and reactive to light.  Neck:     Thyroid: No thyromegaly.     Vascular: No carotid bruit or JVD.  Cardiovascular:     Rate and Rhythm: Normal rate and  regular rhythm.     Heart sounds: Normal heart sounds. No gallop.   Pulmonary:     Effort: Pulmonary effort is normal. No respiratory distress.     Breath sounds: Normal breath sounds. No wheezing or rales.  Abdominal:     General: Bowel sounds are normal. There is no distension or abdominal bruit.     Palpations: Abdomen is soft. There is no mass.     Tenderness: There is no abdominal tenderness.  Musculoskeletal:     Cervical back: Normal range of motion and neck supple.     Right lower leg: No edema.     Left lower leg: No edema.  Lymphadenopathy:     Cervical: No cervical adenopathy.  Skin:    General: Skin is warm and dry.     Findings: No erythema or rash.  Neurological:     Mental Status: She is alert.     Sensory: No sensory deficit.     Coordination: Coordination normal.     Deep Tendon Reflexes: Reflexes are normal and symmetric.  Psychiatric:        Mood and Affect: Mood normal.           Assessment & Plan:   Problem List Items Addressed This Visit      Cardiovascular and Mediastinum   Essential hypertension    bp is better on 2nd check generally  No change in tx        Endocrine   DM (diabetes mellitus), type 2, uncontrolled ([place]) - Primary    Improvement now back on januvia Lab Results  Component Value Date   HGBA1C 6.9 (A) 07/11/2019   Plans to get eye exam (family will schedule) More active Enc low glycemic diet  On arb and statin       Relevant Orders  POCT glycosylated hemoglobin (Hb A1C) (Completed)   Hyperlipidemia associated with type 2 diabetes mellitus (H[facility])    Controlled with statin in setting of DM  Disc goals for lipids and reasons to control them Rev last labs with pt Rev low sat fat diet in detail         Other   Dysuria    Pos UA -pending culture will tx His may worsen her incontinence Enc more fluid intake       Relevant Orders   POCT Urinalysis Dipstick (Automated) (Completed)   Urine Culture    Other Visit  Diagnoses    Abnormal urinalysis       Relevant Orders   Urine Culture

## 2019-07-12 LAB — URINE CULTURE
MICRO NUMBER:: 10337146
SPECIMEN QUALITY:: ADEQUATE

## 2019-08-20 ENCOUNTER — Telehealth: Payer: Self-pay | Admitting: Family Medicine

## 2019-08-20 NOTE — Progress Notes (Signed)
°  Chronic Care Management   Outreach Note  08/20/2019 Name: YANAI HOBSON MRN: [id number] DOB: [birth year]  Referred by: Tower, Audrie Gallus, MD Reason for referral : No chief complaint on file.   An unsuccessful telephone outreach was attempted today. The patient was referred to the pharmacist for assistance with care management and care coordination.   This note is not being shared with the patient for the following reason: To respect privacy (The patient or proxy has requested that the information not be shared).  Follow Up Plan:   Lynnae January [facility] Scheduler

## 2019-09-10 ENCOUNTER — Telehealth: Payer: Self-pay | Admitting: Family Medicine

## 2019-09-10 DIAGNOSIS — Z9181 History of falling: Secondary | ICD-10-CM

## 2019-09-10 DIAGNOSIS — R531 Weakness: Secondary | ICD-10-CM

## 2019-09-10 DIAGNOSIS — M170 Bilateral primary osteoarthritis of knee: Secondary | ICD-10-CM

## 2019-09-10 DIAGNOSIS — R2689 Other abnormalities of gait and mobility: Secondary | ICD-10-CM

## 2019-09-10 NOTE — Telephone Encounter (Signed)
The following message rec from her daughter (put in her own mychart)  Hi! This question is actually about my mother who is also a patient of yours. I thought this would be the easiest way to talk to you about her being she doesn't have a MyChart set up. Amanda Benson) I'm getting really concerned about her mobility. She can barely walk even with a walker now. I know it's because she's not getting any exercise. She just sits in her rocking chair all day. The most she moves is going to the bathroom, going to her room to get dressed, taking a shower or me helping her outside to sit in her swing for a bit.  I've contacted her insurance company and asked about getting her physical therapy. They said they do cover that. I feel she really needs someone to come out a few times a week to work with her. She doesn't like the idea, but agrees to why she needs it. I think if someone comes out and gets her going awhile that she will actually let me help her keep things going. So, the insurance company said we have to talk to you about getting all that set up for her. Thank you for you time Amanda Benson

## 2019-09-13 NOTE — Telephone Encounter (Signed)
Face to Face Appt needed for [facility] Pt Referral. Appt scheduled for 10/12/19 per Dr Milinda Antis to see for [facility] PT referral and Diabetes check.

## 2019-09-14 DIAGNOSIS — E785 Hyperlipidemia, unspecified: Secondary | ICD-10-CM | POA: Diagnosis not present

## 2019-09-14 DIAGNOSIS — H9190 Unspecified hearing loss, unspecified ear: Secondary | ICD-10-CM | POA: Diagnosis not present

## 2019-09-14 DIAGNOSIS — M170 Bilateral primary osteoarthritis of knee: Secondary | ICD-10-CM | POA: Diagnosis not present

## 2019-09-14 DIAGNOSIS — M810 Age-related osteoporosis without current pathological fracture: Secondary | ICD-10-CM | POA: Diagnosis not present

## 2019-09-14 DIAGNOSIS — Z7984 Long term (current) use of oral hypoglycemic drugs: Secondary | ICD-10-CM | POA: Diagnosis not present

## 2019-09-14 DIAGNOSIS — I10 Essential (primary) hypertension: Secondary | ICD-10-CM | POA: Diagnosis not present

## 2019-09-14 DIAGNOSIS — E1169 Type 2 diabetes mellitus with other specified complication: Secondary | ICD-10-CM | POA: Diagnosis not present

## 2019-09-14 DIAGNOSIS — F418 Other specified anxiety disorders: Secondary | ICD-10-CM | POA: Diagnosis not present

## 2019-09-14 DIAGNOSIS — E039 Hypothyroidism, unspecified: Secondary | ICD-10-CM | POA: Diagnosis not present

## 2019-09-14 DIAGNOSIS — Z9181 History of falling: Secondary | ICD-10-CM | POA: Diagnosis not present

## 2019-09-14 DIAGNOSIS — Z7982 Long term (current) use of aspirin: Secondary | ICD-10-CM | POA: Diagnosis not present

## 2019-09-17 ENCOUNTER — Telehealth: Payer: Self-pay

## 2019-09-17 NOTE — Telephone Encounter (Signed)
Verbal order given to Amanda Benson  

## 2019-09-17 NOTE — Telephone Encounter (Signed)
I received a call from [facility] with [facility] requesting VO for [facility] PT 1x a week for 1 week, 2x a week for 4 weeks, and 1x a week for 4 weeks.  She can be reached back [phone]

## 2019-09-17 NOTE — Telephone Encounter (Signed)
Please ok those verbal orders Thanks  

## 2019-09-19 DIAGNOSIS — E785 Hyperlipidemia, unspecified: Secondary | ICD-10-CM | POA: Diagnosis not present

## 2019-09-19 DIAGNOSIS — Z9181 History of falling: Secondary | ICD-10-CM | POA: Diagnosis not present

## 2019-09-19 DIAGNOSIS — E1169 Type 2 diabetes mellitus with other specified complication: Secondary | ICD-10-CM | POA: Diagnosis not present

## 2019-09-19 DIAGNOSIS — I10 Essential (primary) hypertension: Secondary | ICD-10-CM | POA: Diagnosis not present

## 2019-09-19 DIAGNOSIS — Z7984 Long term (current) use of oral hypoglycemic drugs: Secondary | ICD-10-CM | POA: Diagnosis not present

## 2019-09-19 DIAGNOSIS — E039 Hypothyroidism, unspecified: Secondary | ICD-10-CM | POA: Diagnosis not present

## 2019-09-19 DIAGNOSIS — H9190 Unspecified hearing loss, unspecified ear: Secondary | ICD-10-CM | POA: Diagnosis not present

## 2019-09-19 DIAGNOSIS — M810 Age-related osteoporosis without current pathological fracture: Secondary | ICD-10-CM | POA: Diagnosis not present

## 2019-09-19 DIAGNOSIS — F418 Other specified anxiety disorders: Secondary | ICD-10-CM | POA: Diagnosis not present

## 2019-09-19 DIAGNOSIS — M170 Bilateral primary osteoarthritis of knee: Secondary | ICD-10-CM | POA: Diagnosis not present

## 2019-09-19 DIAGNOSIS — Z7982 Long term (current) use of aspirin: Secondary | ICD-10-CM | POA: Diagnosis not present

## 2019-10-03 ENCOUNTER — Other Ambulatory Visit: Payer: Self-pay | Admitting: Family Medicine

## 2019-10-09 DIAGNOSIS — Z7982 Long term (current) use of aspirin: Secondary | ICD-10-CM | POA: Diagnosis not present

## 2019-10-09 DIAGNOSIS — H9190 Unspecified hearing loss, unspecified ear: Secondary | ICD-10-CM | POA: Diagnosis not present

## 2019-10-09 DIAGNOSIS — E785 Hyperlipidemia, unspecified: Secondary | ICD-10-CM | POA: Diagnosis not present

## 2019-10-09 DIAGNOSIS — I10 Essential (primary) hypertension: Secondary | ICD-10-CM | POA: Diagnosis not present

## 2019-10-09 DIAGNOSIS — E039 Hypothyroidism, unspecified: Secondary | ICD-10-CM | POA: Diagnosis not present

## 2019-10-09 DIAGNOSIS — M810 Age-related osteoporosis without current pathological fracture: Secondary | ICD-10-CM | POA: Diagnosis not present

## 2019-10-09 DIAGNOSIS — F418 Other specified anxiety disorders: Secondary | ICD-10-CM | POA: Diagnosis not present

## 2019-10-09 DIAGNOSIS — Z7984 Long term (current) use of oral hypoglycemic drugs: Secondary | ICD-10-CM | POA: Diagnosis not present

## 2019-10-09 DIAGNOSIS — M170 Bilateral primary osteoarthritis of knee: Secondary | ICD-10-CM | POA: Diagnosis not present

## 2019-10-09 DIAGNOSIS — Z9181 History of falling: Secondary | ICD-10-CM | POA: Diagnosis not present

## 2019-10-09 DIAGNOSIS — E1169 Type 2 diabetes mellitus with other specified complication: Secondary | ICD-10-CM | POA: Diagnosis not present

## 2019-10-12 ENCOUNTER — Other Ambulatory Visit: Payer: Self-pay

## 2019-10-12 ENCOUNTER — Ambulatory Visit (INDEPENDENT_AMBULATORY_CARE_PROVIDER_SITE_OTHER): Payer: PPO | Admitting: Family Medicine

## 2019-10-12 ENCOUNTER — Encounter: Payer: Self-pay | Admitting: Family Medicine

## 2019-10-12 VITALS — BP 148/78 | HR 75 | Temp 96.7°F | Ht 62.5 in | Wt 136.0 lb

## 2019-10-12 DIAGNOSIS — E1165 Type 2 diabetes mellitus with hyperglycemia: Secondary | ICD-10-CM | POA: Diagnosis not present

## 2019-10-12 DIAGNOSIS — I10 Essential (primary) hypertension: Secondary | ICD-10-CM

## 2019-10-12 DIAGNOSIS — Z7409 Other reduced mobility: Secondary | ICD-10-CM

## 2019-10-12 DIAGNOSIS — R531 Weakness: Secondary | ICD-10-CM

## 2019-10-12 DIAGNOSIS — R32 Unspecified urinary incontinence: Secondary | ICD-10-CM | POA: Diagnosis not present

## 2019-10-12 DIAGNOSIS — Z9181 History of falling: Secondary | ICD-10-CM

## 2019-10-12 DIAGNOSIS — R296 Repeated falls: Secondary | ICD-10-CM

## 2019-10-12 LAB — POCT GLYCOSYLATED HEMOGLOBIN (HGB A1C): Hemoglobin A1C: 7.3 % — AB (ref 4.0–5.6)

## 2019-10-12 LAB — POC URINALSYSI DIPSTICK (AUTOMATED)
Bilirubin, UA: NEGATIVE
Glucose, UA: NEGATIVE
Nitrite, UA: NEGATIVE
Protein, UA: POSITIVE — AB
Spec Grav, UA: 1.025 (ref 1.010–1.025)
Urobilinogen, UA: 0.2 E.U./dL
pH, UA: 6 (ref 5.0–8.0)

## 2019-10-12 NOTE — Progress Notes (Signed)
Subjective:    Patient ID: Amanda Benson, female    DOB: 02-Oct-1935, 84 y.o.   MRN: [id number]  This visit occurred during the SARS-CoV-2 public health emergency.  Safety protocols were in place, including screening questions prior to the visit, additional usage of staff PPE, and extensive cleaning of exam room while observing appropriate contact time as indicated for disinfecting solutions.    HPI  Pt presents for face to face visit for home health  Family had called in June noting that she was very weak and mobility was declining -req HH PT  She is homebound and cannot do much of anything without assistance as of late Also poor balance and generalized weakness  Also diabetes f/u    Was working with [facility]   PT  Prior to this noted progressive weakness-enough to disable totally from doing ADLs and walking w/o assist  She requires help with bathing and preparing food  Was sob with small exertion and cannot rise from chair w/o assist  Entirely homebound-cannot leave house w/o assist of at least one person Has experienced multiple falls - very poor balance baseline as well  This frustrates her and her family as she misses her independence   PT is coming out now Daughter notes this is helping and walking has started to improve   Another visit today  They are walking with her  Working on leg strength and balance so far    HTN bp is stable today  No cp or palpitations or headaches or edema  No side effects to medicines  BP Readings from Last 3 Encounters:  10/12/19 (!) 148/78  07/11/19 (!) 156/68  03/27/19 140/65    Re check is usually improved  Pulse Readings from Last 3 Encounters:  10/12/19 75  07/11/19 74  03/27/19 65     DM2 Lab Results  Component Value Date   HGBA1C 6.9 (A) 07/11/2019   Glipizide januvia  Metformin   Eating fair  Not a lot of sweets  Appetite is ok overall  Too much ordering out overall  No vision problems   Lab  Results  Component Value Date   HGBA1C 7.3 (A) 10/12/2019   In donut hole now  Unable to get [place]  Will be able to afford it in jan   More urine leakage  Burned a little one day-better now  Urine looked clear   ua today: Results for orders placed or performed in visit on 10/12/19  POCT glycosylated hemoglobin (Hb A1C)  Result Value Ref Range   Hemoglobin A1C 7.3 (A) 4.0 - 5.6 %   HbA1c POC (<> result, manual entry)     HbA1c, POC (prediabetic range)     HbA1c, POC (controlled diabetic range)    POCT Urinalysis Dipstick (Automated)  Result Value Ref Range   Color, UA Yellow    Clarity, UA cloudy    Glucose, UA Negative Negative   Bilirubin, UA neg    Ketones, UA +    Spec Grav, UA 1.025 1.010 - 1.025   Blood, UA Large    pH, UA 6.0 5.0 - 8.0   Protein, UA Positive (A) Negative   Urobilinogen, UA 0.2 0.2 or 1.0 E.U./dL   Nitrite, UA neg    Leukocytes, UA Large (3+) (A) Negative    On arb and statin   Glucose at home usually 130 to 140s with occ over 160  These are at different times w/o a particular pattern  Patient Active Problem List   Diagnosis Date Noted  . Incontinence of urine 10/12/2019  . Grief reaction 03/27/2019  . Elevated serum creatinine 06/13/2018  . Dysuria 06/13/2018  . Hearing loss 01/02/2018  . Subclinical hypothyroidism 10/26/2017  . Depression with anxiety 08/24/2016  . Falls frequently 08/24/2016  . Generalized weakness 08/24/2016  . Routine general medical examination at a health care facility 05/16/2015  . Breast calcifications on mammogram 03/01/2014  . Encounter for Medicare annual wellness exam 05/30/2013  . Breast calcification seen on mammogram 12/14/2012  . Breast calcification, left 11/28/2012  . History of falling 11/27/2012  . Poor balance 11/27/2012  . Colon cancer screening 05/10/2012  . Osteoporosis 06/21/2007  . DM (diabetes mellitus), type 2, uncontrolled ([place]) 09/20/2006  . Hyperlipidemia associated with type 2  diabetes mellitus ([place]) 09/20/2006  . Essential hypertension 09/20/2006  . Osteoarthritis 09/20/2006   Past Medical History:  Diagnosis Date  . Depression   . Diabetes mellitus    type II  . Hyperlipidemia   . Hypertension   . Obesity   . Osteoporosis    Past Surgical History:  Procedure Laterality Date  . ANKLE SURGERY     left medial malleblus fracture  . CHOLECYSTECTOMY     Social History   Tobacco Use  . Smoking status: Never Smoker  . Smokeless tobacco: Never Used  Vaping Use  . Vaping Use: Never used  Substance Use Topics  . Alcohol use: No    Alcohol/week: 0.0 standard drinks  . Drug use: No   Family History  Problem Relation Age of Onset  . Diabetes Mother   . Hypertension Mother   . Asthma Father   . Hypertension Sister   . Cancer Sister        lung  . Asthma Brother   . COPD Brother   . Cancer Maternal Grandmother        breast   . Asthma Brother   . Diabetes Mellitus II Daughter   . COPD Daughter    Allergies  Allergen Reactions  . Alendronate Sodium     REACTION: heartburn   Current Outpatient Medications on File Prior to Visit  Medication Sig Dispense Refill  . amLODipine (NORVASC) 10 MG tablet TAKE 1 TABLET(10 MG) BY MOUTH DAILY 90 tablet 0  . aspirin 81 MG EC tablet Take 81 mg by mouth daily.      . Blood Glucose Monitoring Suppl (ONE TOUCH ULTRA SYSTEM KIT) W/DEVICE KIT To check sugar daily and as needed for DM2 250.0  1 each 0  . buPROPion (WELLBUTRIN XL) 300 MG 24 hr tablet Take 1 tablet (300 mg total) by mouth daily. 90 tablet 3  . FLUoxetine (PROZAC) 40 MG capsule TAKE 1 CAPSULE(40 MG) BY MOUTH DAILY 90 capsule 0  . glipiZIDE (GLUCOTROL XL) 10 MG 24 hr tablet TAKE 1 TABLET(10 MG) BY MOUTH DAILY 90 tablet 0  . glucose blood (ONE TOUCH ULTRA TEST) test strip Use to check blood sugar once daily (Dx. E11.9) 100 each 1  . JANUVIA 50 MG tablet TAKE 1 TABLET(50 MG) BY MOUTH DAILY 90 tablet 0  . losartan-hydrochlorothiazide (HYZAAR) 100-25  MG tablet TAKE 1 TABLET BY MOUTH DAILY 90 tablet 0  . metFORMIN (GLUCOPHAGE) 1000 MG tablet TAKE 1 TABLET(1000 MG) BY MOUTH TWICE DAILY WITH A MEAL 180 tablet 3  . metoprolol succinate (TOPROL-XL) 25 MG 24 hr tablet TAKE 1 TABLET(25 MG) BY MOUTH DAILY 90 tablet 0  . raloxifene (EVISTA) 60 MG tablet  Take 1 tablet (60 mg total) by mouth daily. 90 tablet 3  . simvastatin (ZOCOR) 20 MG tablet Take 1 tablet (20 mg total) by mouth daily. 90 tablet 3   No current facility-administered medications on file prior to visit.    Review of Systems  Constitutional: Positive for fatigue. Negative for activity change, appetite change, fever and unexpected weight change.       Poor conditioning  HENT: Negative for congestion, ear pain, rhinorrhea, sinus pressure and sore throat.   Eyes: Negative for pain, redness and visual disturbance.  Respiratory: Negative for cough, shortness of breath and wheezing.   Cardiovascular: Negative for chest pain and palpitations.  Gastrointestinal: Negative for abdominal pain, blood in stool, constipation and diarrhea.  Endocrine: Negative for polydipsia and polyuria.  Genitourinary: Positive for dysuria, frequency and urgency. Negative for flank pain and hematuria.  Musculoskeletal: Negative for arthralgias, back pain and myalgias.  Skin: Negative for pallor and rash.  Allergic/Immunologic: Negative for environmental allergies.  Neurological: Positive for weakness and light-headedness. Negative for dizziness, tremors, syncope, speech difficulty, numbness and headaches.       Poor balance worsened  More falls  Hematological: Negative for adenopathy. Does not bruise/bleed easily.  Psychiatric/Behavioral: Negative for decreased concentration and dysphoric mood. The patient is not nervous/anxious.        Objective:   Physical Exam Constitutional:      General: She is not in acute distress.    Appearance: She is well-developed and normal weight. She is not ill-appearing or  diaphoretic.     Comments: Frail appearing   HENT:     Head: Normocephalic and atraumatic.  Eyes:     Conjunctiva/sclera: Conjunctivae normal.     Pupils: Pupils are equal, round, and reactive to light.  Neck:     Thyroid: No thyromegaly.     Vascular: No carotid bruit or JVD.  Cardiovascular:     Rate and Rhythm: Normal rate and regular rhythm.     Heart sounds: Normal heart sounds. No gallop.   Pulmonary:     Effort: Pulmonary effort is normal. No respiratory distress.     Breath sounds: Normal breath sounds. No wheezing or rales.  Abdominal:     General: Bowel sounds are normal. There is no distension or abdominal bruit.     Palpations: Abdomen is soft. There is no mass.     Tenderness: There is no abdominal tenderness.  Musculoskeletal:     Cervical back: Normal range of motion and neck supple.     Right lower leg: No edema.     Left lower leg: No edema.     Comments: Varicosities in legs   Pt cannot walk w/o assist of one person  Wide based shuffling gait  Cannot rise from chair w/o assist of one person even when using arms of chair   Backwards drift with rhomberg  Lymphadenopathy:     Cervical: No cervical adenopathy.  Skin:    General: Skin is warm and dry.     Findings: No rash.  Neurological:     Mental Status: She is alert.     Cranial Nerves: Cranial nerves are intact.     Sensory: Sensation is intact.     Motor: Weakness present. No tremor, atrophy, abnormal muscle tone or pronator drift.     Coordination: Romberg sign positive. Coordination abnormal. Impaired rapid alternating movements.     Gait: Gait abnormal and tandem walk abnormal.     Deep Tendon Reflexes: Reflexes are  normal and symmetric.     Comments: Backwards sway with romberg  Slow gait with assist (wide based and does not lift feet well)  Symmetric strength in UE 5/5  Symmetric strength in LEs - 4/5 in quads and hamstrings  Toe flexors 4/5  (cannot stand on toes)   No muscular atrophy    Tremor of hands only when very fatigued DTRs are symmetric     Psychiatric:        Attention and Perception: Attention normal.        Mood and Affect: Mood normal. Affect is flat.        Behavior: Behavior normal.        Cognition and Memory: Cognition normal.           Assessment & Plan:   Problem List Items Addressed This Visit      Cardiovascular and Mediastinum   Essential hypertension    bp in fair control at this time  BP Readings from Last 1 Encounters:  10/12/19 (!) 148/78  some white coat component as usual-lower at home No changes needed Most recent labs reviewed  Disc lifstyle change with low sodium diet and exercise          Endocrine   DM (diabetes mellitus), type 2, uncontrolled (HCC)    A1C is up since she is in the donut hole and cannot afford it  Lab Results  Component Value Date   HGBA1C 7.3 (A) 10/12/2019   Diet is mostly unchanged Now getting a little exercise with PT  Will reach out to our pharmacist so see if any help is available for her with this medication  Needs eye exam-daughter promises to schedule this for her      Relevant Orders   POCT glycosylated hemoglobin (Hb A1C) (Completed)     Other   History of falling    With baseline poor balance HH PT was started       Falls frequently    Doing PT at home currently      Generalized weakness - Primary    This recently worsened/also poor balance and falls  Unable to do ADLs at this time or walk unassisted even with walker or rise from a seated position Became weaker when she stopped trying to move  Multifactorial  Now starting [facility] PT and per daughter making some improvement       Incontinence of urine    Worse lately  occ dysuria  ua with leukocytes Sent for cx      Relevant Orders   POCT Urinalysis Dipstick (Automated) (Completed)   Urine Culture   Mobility impaired    Due to generalized weakness and also poor balance  Now doing PT at home (also home bound)

## 2019-10-12 NOTE — Patient Instructions (Addendum)
Make sure to make your eye doctor appointment   Get back on januvia when you can afford it (I will also contact your pharmacist to see if she can help)  That is why the a1c went up  Do watch your diet   Keep up the PT - I'm glad it is starting to help   Try to give a urine sample on the way out -if not bring one Monday   Follow up in October as planned

## 2019-10-13 DIAGNOSIS — Z7409 Other reduced mobility: Secondary | ICD-10-CM | POA: Insufficient documentation

## 2019-10-13 LAB — URINE CULTURE
MICRO NUMBER:: 10686409
SPECIMEN QUALITY:: ADEQUATE

## 2019-10-13 NOTE — Assessment & Plan Note (Signed)
With baseline poor balance HH PT was started

## 2019-10-13 NOTE — Assessment & Plan Note (Signed)
This recently worsened/also poor balance and falls  Unable to do ADLs at this time or walk unassisted even with walker or rise from a seated position Became weaker when she stopped trying to move  Multifactorial  Now starting [facility] PT and per daughter making some improvement

## 2019-10-13 NOTE — Assessment & Plan Note (Signed)
A1C is up since she is in the donut hole and cannot afford it  Lab Results  Component Value Date   HGBA1C 7.3 (A) 10/12/2019   Diet is mostly unchanged Now getting a little exercise with PT  Will reach out to our pharmacist so see if any help is available for her with this medication  Needs eye exam-daughter promises to schedule this for her

## 2019-10-13 NOTE — Assessment & Plan Note (Signed)
Worse lately  occ dysuria  ua with leukocytes Sent for cx

## 2019-10-13 NOTE — Assessment & Plan Note (Signed)
Due to generalized weakness and also poor balance  Now doing PT at home (also home bound)

## 2019-10-13 NOTE — Assessment & Plan Note (Addendum)
Doing PT at home currently

## 2019-10-13 NOTE — Assessment & Plan Note (Signed)
bp in fair control at this time  BP Readings from Last 1 Encounters:  10/12/19 (!) 148/78  some white coat component as usual-lower at home No changes needed Most recent labs reviewed  Disc lifstyle change with low sodium diet and exercise

## 2019-10-15 ENCOUNTER — Telehealth: Payer: Self-pay | Admitting: *Deleted

## 2019-10-15 NOTE — Telephone Encounter (Signed)
Left VM requesting pt's daughter to call the office back, regarding urine cx results

## 2019-10-18 ENCOUNTER — Telehealth: Payer: Self-pay

## 2019-10-18 NOTE — Telephone Encounter (Signed)
Provider portion of application has been placed in Dr. Royden Purl mailbox for signature, thanks!

## 2019-10-18 NOTE — Telephone Encounter (Signed)
Per request of PCP, contacted patient today to discuss cost options for Januvia. Spoke to Luther, patient's daughter. Reports patient is unable to afford Januvia during the coverage gap. She would like to apply for patient assistance. Reviewed income requirements and additional documentation needed. Offered to mail forms or pick up at office. She would like forms mailed to the home.   Plan: Pharmacist to mail assistance form to patient's home today. Daughter may bring completed form back to office or mail directly to the manufacturer. Pharmacist to assist with completion of provider portion.    Phil Dopp, PharmD Clinical Pharmacist [facility] at [facility] [phone]

## 2019-10-19 NOTE — Telephone Encounter (Signed)
done

## 2019-10-22 DIAGNOSIS — H9190 Unspecified hearing loss, unspecified ear: Secondary | ICD-10-CM | POA: Diagnosis not present

## 2019-10-22 DIAGNOSIS — Z9181 History of falling: Secondary | ICD-10-CM | POA: Diagnosis not present

## 2019-10-22 DIAGNOSIS — F418 Other specified anxiety disorders: Secondary | ICD-10-CM | POA: Diagnosis not present

## 2019-10-22 DIAGNOSIS — I10 Essential (primary) hypertension: Secondary | ICD-10-CM | POA: Diagnosis not present

## 2019-10-22 DIAGNOSIS — M170 Bilateral primary osteoarthritis of knee: Secondary | ICD-10-CM | POA: Diagnosis not present

## 2019-10-22 DIAGNOSIS — E785 Hyperlipidemia, unspecified: Secondary | ICD-10-CM | POA: Diagnosis not present

## 2019-10-22 DIAGNOSIS — M810 Age-related osteoporosis without current pathological fracture: Secondary | ICD-10-CM | POA: Diagnosis not present

## 2019-10-22 DIAGNOSIS — Z7984 Long term (current) use of oral hypoglycemic drugs: Secondary | ICD-10-CM | POA: Diagnosis not present

## 2019-10-22 DIAGNOSIS — E039 Hypothyroidism, unspecified: Secondary | ICD-10-CM | POA: Diagnosis not present

## 2019-10-22 DIAGNOSIS — Z7982 Long term (current) use of aspirin: Secondary | ICD-10-CM | POA: Diagnosis not present

## 2019-10-22 DIAGNOSIS — E1169 Type 2 diabetes mellitus with other specified complication: Secondary | ICD-10-CM | POA: Diagnosis not present

## 2019-10-24 NOTE — Telephone Encounter (Signed)
Not sure where to fax form to so placed back in Michelle's office

## 2019-10-25 NOTE — Telephone Encounter (Signed)
Contacted [name] today, they have not received the application yet. Application was mailed 10/18/19. Asked her to call me if she hasn't received it by the middle of next week. Will submit entire application once we have received the patient portion.  Phil Dopp, PharmD Clinical Pharmacist [facility] at [facility] [phone]

## 2019-11-07 NOTE — Telephone Encounter (Signed)
Paperwork in Amanda Benson's in box

## 2019-11-08 NOTE — Chronic Care Management (AMB) (Signed)
Application reviewed and mailed to [employer] patient assistance program 11/08/19.   Phil Dopp, PharmD Clinical Pharmacist [facility] at [facility] [phone]

## 2019-11-11 ENCOUNTER — Other Ambulatory Visit: Payer: Self-pay | Admitting: Family Medicine

## 2019-11-22 ENCOUNTER — Emergency Department: Payer: PPO

## 2019-11-22 ENCOUNTER — Encounter: Payer: Self-pay | Admitting: Family Medicine

## 2019-11-22 ENCOUNTER — Other Ambulatory Visit: Payer: Self-pay

## 2019-11-22 DIAGNOSIS — N179 Acute kidney failure, unspecified: Secondary | ICD-10-CM | POA: Diagnosis not present

## 2019-11-22 DIAGNOSIS — E669 Obesity, unspecified: Secondary | ICD-10-CM | POA: Diagnosis present

## 2019-11-22 DIAGNOSIS — K449 Diaphragmatic hernia without obstruction or gangrene: Secondary | ICD-10-CM | POA: Diagnosis present

## 2019-11-22 DIAGNOSIS — Z8249 Family history of ischemic heart disease and other diseases of the circulatory system: Secondary | ICD-10-CM

## 2019-11-22 DIAGNOSIS — Z833 Family history of diabetes mellitus: Secondary | ICD-10-CM

## 2019-11-22 DIAGNOSIS — E1122 Type 2 diabetes mellitus with diabetic chronic kidney disease: Secondary | ICD-10-CM | POA: Diagnosis present

## 2019-11-22 DIAGNOSIS — Z20822 Contact with and (suspected) exposure to covid-19: Secondary | ICD-10-CM | POA: Diagnosis not present

## 2019-11-22 DIAGNOSIS — N390 Urinary tract infection, site not specified: Secondary | ICD-10-CM | POA: Diagnosis not present

## 2019-11-22 DIAGNOSIS — Z79899 Other long term (current) drug therapy: Secondary | ICD-10-CM

## 2019-11-22 DIAGNOSIS — E860 Dehydration: Secondary | ICD-10-CM | POA: Diagnosis not present

## 2019-11-22 DIAGNOSIS — H9190 Unspecified hearing loss, unspecified ear: Secondary | ICD-10-CM | POA: Diagnosis not present

## 2019-11-22 DIAGNOSIS — E785 Hyperlipidemia, unspecified: Secondary | ICD-10-CM | POA: Diagnosis not present

## 2019-11-22 DIAGNOSIS — K5750 Diverticulosis of both small and large intestine without perforation or abscess without bleeding: Secondary | ICD-10-CM | POA: Diagnosis present

## 2019-11-22 DIAGNOSIS — M810 Age-related osteoporosis without current pathological fracture: Secondary | ICD-10-CM | POA: Diagnosis present

## 2019-11-22 DIAGNOSIS — Z66 Do not resuscitate: Secondary | ICD-10-CM | POA: Diagnosis present

## 2019-11-22 DIAGNOSIS — Z7982 Long term (current) use of aspirin: Secondary | ICD-10-CM

## 2019-11-22 DIAGNOSIS — R6520 Severe sepsis without septic shock: Secondary | ICD-10-CM | POA: Diagnosis not present

## 2019-11-22 DIAGNOSIS — N200 Calculus of kidney: Secondary | ICD-10-CM | POA: Diagnosis not present

## 2019-11-22 DIAGNOSIS — N1832 Chronic kidney disease, stage 3b: Secondary | ICD-10-CM | POA: Diagnosis present

## 2019-11-22 DIAGNOSIS — R5383 Other fatigue: Secondary | ICD-10-CM | POA: Diagnosis not present

## 2019-11-22 DIAGNOSIS — E1110 Type 2 diabetes mellitus with ketoacidosis without coma: Secondary | ICD-10-CM | POA: Diagnosis present

## 2019-11-22 DIAGNOSIS — Z9049 Acquired absence of other specified parts of digestive tract: Secondary | ICD-10-CM

## 2019-11-22 DIAGNOSIS — I700 Atherosclerosis of aorta: Secondary | ICD-10-CM | POA: Diagnosis not present

## 2019-11-22 DIAGNOSIS — Z9181 History of falling: Secondary | ICD-10-CM | POA: Diagnosis not present

## 2019-11-22 DIAGNOSIS — A419 Sepsis, unspecified organism: Principal | ICD-10-CM | POA: Diagnosis present

## 2019-11-22 DIAGNOSIS — F418 Other specified anxiety disorders: Secondary | ICD-10-CM | POA: Diagnosis present

## 2019-11-22 DIAGNOSIS — Z7984 Long term (current) use of oral hypoglycemic drugs: Secondary | ICD-10-CM

## 2019-11-22 DIAGNOSIS — E876 Hypokalemia: Secondary | ICD-10-CM | POA: Diagnosis not present

## 2019-11-22 DIAGNOSIS — Z6825 Body mass index (BMI) 25.0-25.9, adult: Secondary | ICD-10-CM | POA: Diagnosis not present

## 2019-11-22 DIAGNOSIS — I129 Hypertensive chronic kidney disease with stage 1 through stage 4 chronic kidney disease, or unspecified chronic kidney disease: Secondary | ICD-10-CM | POA: Diagnosis not present

## 2019-11-22 DIAGNOSIS — I10 Essential (primary) hypertension: Secondary | ICD-10-CM | POA: Diagnosis not present

## 2019-11-22 DIAGNOSIS — K5730 Diverticulosis of large intestine without perforation or abscess without bleeding: Secondary | ICD-10-CM | POA: Diagnosis not present

## 2019-11-22 DIAGNOSIS — E1151 Type 2 diabetes mellitus with diabetic peripheral angiopathy without gangrene: Secondary | ICD-10-CM | POA: Diagnosis present

## 2019-11-22 DIAGNOSIS — R531 Weakness: Secondary | ICD-10-CM | POA: Diagnosis not present

## 2019-11-22 DIAGNOSIS — D72829 Elevated white blood cell count, unspecified: Secondary | ICD-10-CM | POA: Diagnosis not present

## 2019-11-22 DIAGNOSIS — Z888 Allergy status to other drugs, medicaments and biological substances status: Secondary | ICD-10-CM

## 2019-11-22 LAB — COMPREHENSIVE METABOLIC PANEL
ALT: 11 U/L (ref 0–44)
AST: 19 U/L (ref 15–41)
Albumin: 2.6 g/dL — ABNORMAL LOW (ref 3.5–5.0)
Alkaline Phosphatase: 63 U/L (ref 38–126)
Anion gap: 16 — ABNORMAL HIGH (ref 5–15)
BUN: 52 mg/dL — ABNORMAL HIGH (ref 8–23)
CO2: 17 mmol/L — ABNORMAL LOW (ref 22–32)
Calcium: 9 mg/dL (ref 8.9–10.3)
Chloride: 102 mmol/L (ref 98–111)
Creatinine, Ser: 3.42 mg/dL — ABNORMAL HIGH (ref 0.44–1.00)
GFR calc Af Amer: 14 mL/min — ABNORMAL LOW (ref >60)
GFR calc non Af Amer: 12 mL/min — ABNORMAL LOW (ref >60)
Glucose, Bld: 315 mg/dL — ABNORMAL HIGH (ref 70–99)
Potassium: 4 mmol/L (ref 3.5–5.1)
Sodium: 135 mmol/L (ref 135–145)
Total Bilirubin: 0.5 mg/dL (ref 0.3–1.2)
Total Protein: 7 g/dL (ref 6.5–8.1)

## 2019-11-22 LAB — CBC WITH DIFFERENTIAL/PLATELET
Abs Immature Granulocytes: 0.23 10*3/uL — ABNORMAL HIGH (ref 0.00–0.07)
Basophils Absolute: 0 10*3/uL (ref 0.0–0.1)
Basophils Relative: 0 %
Eosinophils Absolute: 0.2 10*3/uL (ref 0.0–0.5)
Eosinophils Relative: 1 %
HCT: 35.7 % — ABNORMAL LOW (ref 36.0–46.0)
Hemoglobin: 11.4 g/dL — ABNORMAL LOW (ref 12.0–15.0)
Immature Granulocytes: 1 %
Lymphocytes Relative: 6 %
Lymphs Abs: 1.4 10*3/uL (ref 0.7–4.0)
MCH: 28.8 pg (ref 26.0–34.0)
MCHC: 31.9 g/dL (ref 30.0–36.0)
MCV: 90.2 fL (ref 80.0–100.0)
Monocytes Absolute: 1.2 10*3/uL — ABNORMAL HIGH (ref 0.1–1.0)
Monocytes Relative: 5 %
Neutro Abs: 19.5 10*3/uL — ABNORMAL HIGH (ref 1.7–7.7)
Neutrophils Relative %: 87 %
Platelets: 701 10*3/uL — ABNORMAL HIGH (ref 150–400)
RBC: 3.96 MIL/uL (ref 3.87–5.11)
RDW: 12.5 % (ref 11.5–15.5)
WBC: 22.5 10*3/uL — ABNORMAL HIGH (ref 4.0–10.5)
nRBC: 0 % (ref 0.0–0.2)

## 2019-11-22 LAB — CBC
HCT: 35.3 % — ABNORMAL LOW (ref 36.0–46.0)
Hemoglobin: 11.4 g/dL — ABNORMAL LOW (ref 12.0–15.0)
MCH: 28.8 pg (ref 26.0–34.0)
MCHC: 32.3 g/dL (ref 30.0–36.0)
MCV: 89.1 fL (ref 80.0–100.0)
Platelets: 661 10*3/uL — ABNORMAL HIGH (ref 150–400)
RBC: 3.96 MIL/uL (ref 3.87–5.11)
RDW: 12.4 % (ref 11.5–15.5)
WBC: 22.7 10*3/uL — ABNORMAL HIGH (ref 4.0–10.5)
nRBC: 0 % (ref 0.0–0.2)

## 2019-11-22 NOTE — ED Triage Notes (Signed)
Pt states has had generalized weakness today, denies any n.v.d or dysuria. Denies any other symptoms, no recent illness. Pt denies any shob or cp, no fever. Has had decreased appetite for 2 days.

## 2019-11-23 ENCOUNTER — Inpatient Hospital Stay
Admission: EM | Admit: 2019-11-23 | Discharge: 2019-11-30 | DRG: 871 | Disposition: A | Discharge status: Home Health | Payer: PPO | Attending: Internal Medicine | Admitting: Internal Medicine

## 2019-11-23 ENCOUNTER — Inpatient Hospital Stay: Payer: PPO

## 2019-11-23 ENCOUNTER — Emergency Department: Payer: PPO

## 2019-11-23 DIAGNOSIS — I10 Essential (primary) hypertension: Secondary | ICD-10-CM | POA: Diagnosis not present

## 2019-11-23 DIAGNOSIS — K5750 Diverticulosis of both small and large intestine without perforation or abscess without bleeding: Secondary | ICD-10-CM | POA: Diagnosis present

## 2019-11-23 DIAGNOSIS — F418 Other specified anxiety disorders: Secondary | ICD-10-CM

## 2019-11-23 DIAGNOSIS — R0602 Shortness of breath: Secondary | ICD-10-CM

## 2019-11-23 DIAGNOSIS — E785 Hyperlipidemia, unspecified: Secondary | ICD-10-CM | POA: Diagnosis present

## 2019-11-23 DIAGNOSIS — E860 Dehydration: Secondary | ICD-10-CM

## 2019-11-23 DIAGNOSIS — R531 Weakness: Secondary | ICD-10-CM

## 2019-11-23 DIAGNOSIS — Z833 Family history of diabetes mellitus: Secondary | ICD-10-CM | POA: Diagnosis not present

## 2019-11-23 DIAGNOSIS — A419 Sepsis, unspecified organism: Secondary | ICD-10-CM | POA: Diagnosis present

## 2019-11-23 DIAGNOSIS — R5383 Other fatigue: Secondary | ICD-10-CM | POA: Diagnosis not present

## 2019-11-23 DIAGNOSIS — D72825 Bandemia: Secondary | ICD-10-CM

## 2019-11-23 DIAGNOSIS — N200 Calculus of kidney: Secondary | ICD-10-CM | POA: Diagnosis not present

## 2019-11-23 DIAGNOSIS — E1165 Type 2 diabetes mellitus with hyperglycemia: Secondary | ICD-10-CM

## 2019-11-23 DIAGNOSIS — N179 Acute kidney failure, unspecified: Secondary | ICD-10-CM

## 2019-11-23 DIAGNOSIS — K5730 Diverticulosis of large intestine without perforation or abscess without bleeding: Secondary | ICD-10-CM | POA: Diagnosis not present

## 2019-11-23 DIAGNOSIS — R112 Nausea with vomiting, unspecified: Principal | ICD-10-CM

## 2019-11-23 DIAGNOSIS — E1151 Type 2 diabetes mellitus with diabetic peripheral angiopathy without gangrene: Secondary | ICD-10-CM | POA: Diagnosis present

## 2019-11-23 DIAGNOSIS — K449 Diaphragmatic hernia without obstruction or gangrene: Secondary | ICD-10-CM | POA: Diagnosis present

## 2019-11-23 DIAGNOSIS — Z9181 History of falling: Secondary | ICD-10-CM | POA: Diagnosis not present

## 2019-11-23 DIAGNOSIS — E1110 Type 2 diabetes mellitus with ketoacidosis without coma: Secondary | ICD-10-CM | POA: Diagnosis present

## 2019-11-23 DIAGNOSIS — E1122 Type 2 diabetes mellitus with diabetic chronic kidney disease: Secondary | ICD-10-CM | POA: Diagnosis present

## 2019-11-23 DIAGNOSIS — M810 Age-related osteoporosis without current pathological fracture: Secondary | ICD-10-CM | POA: Diagnosis present

## 2019-11-23 DIAGNOSIS — R197 Diarrhea, unspecified: Secondary | ICD-10-CM

## 2019-11-23 DIAGNOSIS — N1832 Chronic kidney disease, stage 3b: Secondary | ICD-10-CM | POA: Diagnosis present

## 2019-11-23 DIAGNOSIS — E876 Hypokalemia: Secondary | ICD-10-CM | POA: Diagnosis present

## 2019-11-23 DIAGNOSIS — E669 Obesity, unspecified: Secondary | ICD-10-CM | POA: Diagnosis present

## 2019-11-23 DIAGNOSIS — D72829 Elevated white blood cell count, unspecified: Secondary | ICD-10-CM | POA: Diagnosis present

## 2019-11-23 DIAGNOSIS — N19 Unspecified kidney failure: Secondary | ICD-10-CM

## 2019-11-23 DIAGNOSIS — I129 Hypertensive chronic kidney disease with stage 1 through stage 4 chronic kidney disease, or unspecified chronic kidney disease: Secondary | ICD-10-CM | POA: Diagnosis present

## 2019-11-23 DIAGNOSIS — N189 Chronic kidney disease, unspecified: Secondary | ICD-10-CM | POA: Diagnosis present

## 2019-11-23 DIAGNOSIS — Z66 Do not resuscitate: Secondary | ICD-10-CM | POA: Diagnosis present

## 2019-11-23 DIAGNOSIS — N390 Urinary tract infection, site not specified: Secondary | ICD-10-CM | POA: Diagnosis present

## 2019-11-23 DIAGNOSIS — Z6825 Body mass index (BMI) 25.0-25.9, adult: Secondary | ICD-10-CM | POA: Diagnosis not present

## 2019-11-23 DIAGNOSIS — R6520 Severe sepsis without septic shock: Secondary | ICD-10-CM | POA: Diagnosis present

## 2019-11-23 DIAGNOSIS — F32A Depression, unspecified: Secondary | ICD-10-CM | POA: Diagnosis present

## 2019-11-23 DIAGNOSIS — Z20822 Contact with and (suspected) exposure to covid-19: Secondary | ICD-10-CM | POA: Diagnosis present

## 2019-11-23 DIAGNOSIS — H9190 Unspecified hearing loss, unspecified ear: Secondary | ICD-10-CM | POA: Diagnosis present

## 2019-11-23 DIAGNOSIS — I700 Atherosclerosis of aorta: Secondary | ICD-10-CM | POA: Diagnosis not present

## 2019-11-23 DIAGNOSIS — Z9049 Acquired absence of other specified parts of digestive tract: Secondary | ICD-10-CM | POA: Diagnosis not present

## 2019-11-23 LAB — TROPONIN I (HIGH SENSITIVITY)
Troponin I (High Sensitivity): 16 ng/L (ref <18)
Troponin I (High Sensitivity): 23 ng/L — ABNORMAL HIGH (ref <18)

## 2019-11-23 LAB — URINALYSIS, COMPLETE (UACMP) WITH MICROSCOPIC
Bacteria, UA: NONE SEEN
Bilirubin Urine: NEGATIVE
Glucose, UA: 50 mg/dL — AB
Ketones, ur: NEGATIVE mg/dL
Nitrite: NEGATIVE
Protein, ur: 100 mg/dL — AB
RBC / HPF: >50 RBC/hpf — ABNORMAL HIGH (ref 0–5)
Specific Gravity, Urine: 1.013 (ref 1.005–1.030)
Squamous Epithelial / HPF: NONE SEEN (ref 0–5)
WBC, UA: >50 WBC/hpf — ABNORMAL HIGH (ref 0–5)
pH: 5 (ref 5.0–8.0)

## 2019-11-23 LAB — BASIC METABOLIC PANEL
Anion gap: 12 (ref 5–15)
Anion gap: 13 (ref 5–15)
BUN: 58 mg/dL — ABNORMAL HIGH (ref 8–23)
BUN: 55 mg/dL — ABNORMAL HIGH (ref 8–23)
CO2: 22 mmol/L (ref 22–32)
CO2: 22 mmol/L (ref 22–32)
Calcium: 8.5 mg/dL — ABNORMAL LOW (ref 8.9–10.3)
Calcium: 8.4 mg/dL — ABNORMAL LOW (ref 8.9–10.3)
Chloride: 102 mmol/L (ref 98–111)
Chloride: 103 mmol/L (ref 98–111)
Creatinine, Ser: 3.58 mg/dL — ABNORMAL HIGH (ref 0.44–1.00)
Creatinine, Ser: 3.37 mg/dL — ABNORMAL HIGH (ref 0.44–1.00)
GFR calc Af Amer: 13 mL/min — ABNORMAL LOW (ref >60)
GFR calc Af Amer: 14 mL/min — ABNORMAL LOW (ref >60)
GFR calc non Af Amer: 11 mL/min — ABNORMAL LOW (ref >60)
GFR calc non Af Amer: 12 mL/min — ABNORMAL LOW (ref >60)
Glucose, Bld: 169 mg/dL — ABNORMAL HIGH (ref 70–99)
Glucose, Bld: 277 mg/dL — ABNORMAL HIGH (ref 70–99)
Potassium: 3.9 mmol/L (ref 3.5–5.1)
Potassium: 3.8 mmol/L (ref 3.5–5.1)
Sodium: 136 mmol/L (ref 135–145)
Sodium: 138 mmol/L (ref 135–145)

## 2019-11-23 LAB — PROCALCITONIN: Procalcitonin: 0.33 ng/mL

## 2019-11-23 LAB — BLOOD GAS, VENOUS
Patient temperature: 37
pCO2, Ven: 41 mmHg — ABNORMAL LOW (ref 44.0–60.0)
pH, Ven: 7.34 (ref 7.250–7.430)
pO2, Ven: 24 mmHg — CL (ref 32.0–45.0)

## 2019-11-23 LAB — GLUCOSE, CAPILLARY
Glucose-Capillary: 144 mg/dL — ABNORMAL HIGH (ref 70–99)
Glucose-Capillary: 226 mg/dL — ABNORMAL HIGH (ref 70–99)

## 2019-11-23 LAB — SARS CORONAVIRUS 2 BY RT PCR (HOSPITAL ORDER, PERFORMED IN ~~LOC~~ HOSPITAL LAB): SARS Coronavirus 2: NEGATIVE

## 2019-11-23 LAB — BETA-HYDROXYBUTYRIC ACID: Beta-Hydroxybutyric Acid: 0.82 mmol/L — ABNORMAL HIGH (ref 0.05–0.27)

## 2019-11-23 LAB — LACTIC ACID, PLASMA
Lactic Acid, Venous: 2.8 mmol/L (ref 0.5–1.9)
Lactic Acid, Venous: 3 mmol/L (ref 0.5–1.9)

## 2019-11-23 MED ORDER — METOPROLOL SUCCINATE ER 25 MG PO TB24
25.0000 mg | ORAL_TABLET | Freq: Every day | ORAL | Status: DC
  Administered 2019-11-23 – 2019-11-30 (×8): 25 mg via ORAL
  Filled 2019-11-23 (×8): qty 1

## 2019-11-23 MED ORDER — INSULIN ASPART 100 UNIT/ML ~~LOC~~ SOLN
0.0000 [IU] | Freq: Three times a day (TID) | SUBCUTANEOUS | Status: DC
  Administered 2019-11-24: 8 [IU] via SUBCUTANEOUS
  Administered 2019-11-24: 5 [IU] via SUBCUTANEOUS
  Administered 2019-11-25 (×2): 8 [IU] via SUBCUTANEOUS
  Administered 2019-11-25: 3 [IU] via SUBCUTANEOUS
  Administered 2019-11-26: 8 [IU] via SUBCUTANEOUS
  Administered 2019-11-26: 5 [IU] via SUBCUTANEOUS
  Administered 2019-11-26 – 2019-11-27 (×2): 3 [IU] via SUBCUTANEOUS
  Administered 2019-11-27: 11 [IU] via SUBCUTANEOUS
  Administered 2019-11-27 – 2019-11-28 (×2): 5 [IU] via SUBCUTANEOUS
  Administered 2019-11-28 – 2019-11-29 (×3): 3 [IU] via SUBCUTANEOUS
  Administered 2019-11-29: 5 [IU] via SUBCUTANEOUS
  Administered 2019-11-29 – 2019-11-30 (×2): 2 [IU] via SUBCUTANEOUS
  Filled 2019-11-23 (×18): qty 1

## 2019-11-23 MED ORDER — SODIUM CHLORIDE 0.9 % IV SOLN: Freq: Once | INTRAVENOUS | Status: AC

## 2019-11-23 MED ORDER — HEPARIN SODIUM (PORCINE) 5000 UNIT/ML IJ SOLN
5000.0000 [IU] | Freq: Three times a day (TID) | INTRAMUSCULAR | Status: DC
  Administered 2019-11-23 – 2019-11-27 (×11): 5000 [IU] via SUBCUTANEOUS
  Filled 2019-11-23 (×11): qty 1

## 2019-11-23 MED ORDER — ACETAMINOPHEN 650 MG RE SUPP: 650.0000 mg | Freq: Four times a day (QID) | RECTAL | Status: DC | PRN

## 2019-11-23 MED ORDER — RALOXIFENE HCL 60 MG PO TABS
60.0000 mg | ORAL_TABLET | Freq: Every day | ORAL | Status: DC
  Administered 2019-11-23 – 2019-11-30 (×8): 60 mg via ORAL
  Filled 2019-11-23 (×8): qty 1

## 2019-11-23 MED ORDER — POTASSIUM CHLORIDE 10 MEQ/100ML IV SOLN
10.0000 meq | Freq: Once | INTRAVENOUS | Status: AC
  Administered 2019-11-23: 10 meq via INTRAVENOUS
  Filled 2019-11-23: qty 100

## 2019-11-23 MED ORDER — ASPIRIN EC 81 MG PO TBEC
81.0000 mg | DELAYED_RELEASE_TABLET | Freq: Every day | ORAL | Status: DC
  Administered 2019-11-23 – 2019-11-30 (×8): 81 mg via ORAL
  Filled 2019-11-23 (×8): qty 1

## 2019-11-23 MED ORDER — ACETAMINOPHEN 325 MG PO TABS: 650.0000 mg | ORAL_TABLET | Freq: Four times a day (QID) | ORAL | Status: DC | PRN

## 2019-11-23 MED ORDER — DEXTROSE IN LACTATED RINGERS 5 % IV SOLN: INTRAVENOUS | Status: DC

## 2019-11-23 MED ORDER — ONDANSETRON HCL 4 MG/2ML IJ SOLN
4.0000 mg | Freq: Once | INTRAMUSCULAR | Status: AC
  Administered 2019-11-23: 4 mg via INTRAVENOUS
  Filled 2019-11-23: qty 2

## 2019-11-23 MED ORDER — FLUOXETINE HCL 20 MG PO CAPS
40.0000 mg | ORAL_CAPSULE | Freq: Every day | ORAL | Status: DC
  Administered 2019-11-23 – 2019-11-30 (×8): 40 mg via ORAL
  Filled 2019-11-23 (×8): qty 2

## 2019-11-23 MED ORDER — ONDANSETRON HCL 4 MG/2ML IJ SOLN: 4.0000 mg | Freq: Four times a day (QID) | INTRAMUSCULAR | Status: DC | PRN

## 2019-11-23 MED ORDER — DEXTROSE 50 % IV SOLN: 0.0000 mL | INTRAVENOUS | Status: DC | PRN

## 2019-11-23 MED ORDER — LACTATED RINGERS IV SOLN: INTRAVENOUS | Status: DC

## 2019-11-23 MED ORDER — ONDANSETRON HCL 4 MG PO TABS: 4.0000 mg | ORAL_TABLET | Freq: Four times a day (QID) | ORAL | Status: DC | PRN

## 2019-11-23 MED ORDER — POTASSIUM CHLORIDE 10 MEQ/100ML IV SOLN
10.0000 meq | INTRAVENOUS | Status: AC
  Administered 2019-11-23: 10 meq via INTRAVENOUS
  Filled 2019-11-23: qty 100

## 2019-11-23 MED ORDER — INSULIN REGULAR(HUMAN) IN NACL 100-0.9 UT/100ML-% IV SOLN: INTRAVENOUS | Status: DC

## 2019-11-23 MED ORDER — AMLODIPINE BESYLATE 10 MG PO TABS
10.0000 mg | ORAL_TABLET | Freq: Every day | ORAL | Status: DC
  Administered 2019-11-23 – 2019-11-30 (×8): 10 mg via ORAL
  Filled 2019-11-23: qty 2
  Filled 2019-11-23: qty 1
  Filled 2019-11-23: qty 2
  Filled 2019-11-23 (×4): qty 1
  Filled 2019-11-23: qty 2

## 2019-11-23 MED ORDER — LACTATED RINGERS IV BOLUS
1000.0000 mL | Freq: Once | INTRAVENOUS | Status: AC
  Administered 2019-11-23: 1000 mL via INTRAVENOUS

## 2019-11-23 MED ORDER — SIMVASTATIN 20 MG PO TABS
20.0000 mg | ORAL_TABLET | Freq: Every day | ORAL | Status: DC
  Administered 2019-11-24 – 2019-11-28 (×5): 20 mg via ORAL
  Filled 2019-11-23: qty 1
  Filled 2019-11-23: qty 2
  Filled 2019-11-23 (×3): qty 1

## 2019-11-23 MED ORDER — SODIUM CHLORIDE 0.9 % IV SOLN: INTRAVENOUS | Status: DC

## 2019-11-23 MED ORDER — SODIUM CHLORIDE 0.9 % IV SOLN
1.0000 g | Freq: Once | INTRAVENOUS | Status: AC
  Administered 2019-11-23: 1 g via INTRAVENOUS
  Filled 2019-11-23: qty 10

## 2019-11-23 MED ORDER — BUPROPION HCL ER (XL) 150 MG PO TB24
300.0000 mg | ORAL_TABLET | Freq: Every day | ORAL | Status: DC
  Administered 2019-11-23 – 2019-11-30 (×8): 300 mg via ORAL
  Filled 2019-11-23 (×8): qty 2

## 2019-11-23 MED ORDER — INSULIN ASPART 100 UNIT/ML ~~LOC~~ SOLN
0.0000 [IU] | Freq: Every day | SUBCUTANEOUS | Status: DC
  Administered 2019-11-26: 2 [IU] via SUBCUTANEOUS
  Filled 2019-11-23: qty 1

## 2019-11-23 NOTE — Progress Notes (Signed)
Inpatient Diabetes Program Recommendations  AACE/ADA: New Consensus Statement on Inpatient Glycemic Control (2015)  Target Ranges:  Prepandial:   less than 140 mg/dL      Peak postprandial:   less than 180 mg/dL (1-2 hours)      Critically ill patients:  140 - 180 mg/dL   Lab Results  Component Value Date   HGBA1C 7.3 (A) 10/12/2019    Review of Glycemic Control  Diabetes history: DM 2 Outpatient Diabetes medications: Glipizide 10 mg Daily, Januvia 50 mg Daily, Metformin 1000 mg bid Current orders for Inpatient glycemic control:  None in ED  A1c 7.3% on 7/9  Inpatient Diabetes Program Recommendations:    Glucose 315 on presentation  -Levemir 5 units - Novolog 0-6 units tid + hs scale  Thanks,  Christena Deem RN, MSN, BC-ADM Inpatient Diabetes Coordinator Team Pager [phone] (8a-5p)

## 2019-11-23 NOTE — ED Notes (Signed)
Patient given her meal tray. Patient's daughter is assisting the patient with the tray.

## 2019-11-23 NOTE — ED Provider Notes (Signed)
Procedures     ----------------------------------------- 10:04 AM on 11/23/2019 -----------------------------------------  Assumed care from Dr. Don Perking.  CT scan suggestive of cystitis.  Vital signs remain normal, and patient is not septic.  However, she does have AKI, leukocytosis, and elevated lactate which I think is due to dehydration.  VBG is normal.  I will start ceftriaxone for treatment of UTI, still awaiting urinalysis.  Patient will need to be hospitalized for continued IV antibiotics until improved due to age and comorbidities.  ----------------------------------------- 12:35 PM on 11/23/2019 -----------------------------------------  Urine obtained which appears grossly purulent.  Vitals remain normal, not septic.  Case discussed with the hospitalist.    Sharman Cheek, MD 11/23/19 1235

## 2019-11-23 NOTE — ED Notes (Signed)
Patient urinated on self and writer changed patients depend, and helped her take her shorts off. Also Clinical research associate gave patient two warm blankets and some ice chips per patients request Dr. Scotty Court notified

## 2019-11-23 NOTE — ED Notes (Signed)
Patient daughter, [name] at bedside with patient

## 2019-11-23 NOTE — ED Notes (Signed)
Report given to Sarah RN

## 2019-11-23 NOTE — ED Provider Notes (Signed)
[facility] Emergency Department Provider Note  ____________________________________________  Time seen: Approximately 6:27 AM  I have reviewed the triage vital signs and the nursing notes.   HISTORY  Chief Complaint Weakness   HPI KAMIAH FITE is a 84 y.o. female with a history of diabetes, hypertension, hyperlipidemia who presents for evaluation of nausea, vomiting, diarrhea.  Symptoms have been ongoing for 2 weeks.  Patient has several daily episodes of watery diarrhea and a few episodes of nonbloody nonbilious emesis.  Was complaining of cramping abdominal pain but no pain at this time.  She has been incontinent of stool and urine.  Patient is complaining of generalized weakness and extreme fatigue.   Has had no appetite for the last 2 days.  No cough, chest pain, shortness of breath.  She has been exposed to Covid 3 weeks ago.  She has not been vaccinated.  Past Medical History:  Diagnosis Date  . Depression   . Diabetes mellitus    type II  . Hyperlipidemia   . Hypertension   . Obesity   . Osteoporosis     Patient Active Problem List   Diagnosis Date Noted  . Mobility impaired 10/13/2019  . Incontinence of urine 10/12/2019  . Grief reaction 03/27/2019  . Elevated serum creatinine 06/13/2018  . Dysuria 06/13/2018  . Hearing loss 01/02/2018  . Subclinical hypothyroidism 10/26/2017  . Depression with anxiety 08/24/2016  . Falls frequently 08/24/2016  . Generalized weakness 08/24/2016  . Routine general medical examination at a health care facility 05/16/2015  . Breast calcifications on mammogram 03/01/2014  . Encounter for Medicare annual wellness exam 05/30/2013  . Breast calcification seen on mammogram 12/14/2012  . Breast calcification, left 11/28/2012  . History of falling 11/27/2012  . Poor balance 11/27/2012  . Colon cancer screening 05/10/2012  . Osteoporosis 06/21/2007  . DM (diabetes mellitus), type 2, uncontrolled ([place])  09/20/2006  . Hyperlipidemia associated with type 2 diabetes mellitus ([place]) 09/20/2006  . Essential hypertension 09/20/2006  . Osteoarthritis 09/20/2006    Past Surgical History:  Procedure Laterality Date  . ANKLE SURGERY     left medial malleblus fracture  . CHOLECYSTECTOMY      Prior to Admission medications   Medication Sig Start Date End Date Taking? Authorizing Provider  amLODipine (NORVASC) 10 MG tablet TAKE 1 TABLET(10 MG) BY MOUTH DAILY 10/03/19   Tower, [name], MD  aspirin 81 MG EC tablet Take 81 mg by mouth daily.      [provider]  Blood Glucose Monitoring Suppl (ONE TOUCH ULTRA SYSTEM KIT) W/DEVICE KIT To check sugar daily and as needed for DM2 250.0  12/23/10   Tower, Roque Lias A, MD  buPROPion (WELLBUTRIN XL) 300 MG 24 hr tablet TAKE 1 TABLET(300 MG) BY MOUTH DAILY 11/12/19   Tower, [name], MD  FLUoxetine (PROZAC) 40 MG capsule TAKE 1 CAPSULE(40 MG) BY MOUTH DAILY 10/03/19   Tower, Marne A, MD  glipiZIDE (GLUCOTROL XL) 10 MG 24 hr tablet TAKE 1 TABLET(10 MG) BY MOUTH DAILY 10/03/19   Tower, Grimes A, MD  glucose blood (ONE TOUCH ULTRA TEST) test strip Use to check blood sugar once daily (Dx. E11.9) 05/23/17   Tower, [name], MD  JANUVIA 50 MG tablet TAKE 1 TABLET(50 MG) BY MOUTH DAILY 10/03/19   Tower, [name], MD  losartan-hydrochlorothiazide (HYZAAR) 100-25 MG tablet TAKE 1 TABLET BY MOUTH DAILY 10/03/19   Tower, [name], MD  metFORMIN (GLUCOPHAGE) 1000 MG tablet TAKE 1  TABLET(1000 MG) BY MOUTH TWICE DAILY WITH A MEAL 11/12/19   Tower, [place] A, MD  metoprolol succinate (TOPROL-XL) 25 MG 24 hr tablet TAKE 1 TABLET(25 MG) BY MOUTH DAILY 10/03/19   Tower, Audrie Gallus, MD  raloxifene (EVISTA) 60 MG tablet TAKE 1 TABLET(60 MG) BY MOUTH DAILY 11/12/19   Tower, Audrie Gallus, MD  simvastatin (ZOCOR) 20 MG tablet TAKE 1 TABLET(20 MG) BY MOUTH DAILY 11/12/19   Tower, Audrie Gallus, MD    Allergies Alendronate sodium  Family History  Problem Relation Age of Onset  . Diabetes Mother   .  Hypertension Mother   . Asthma Father   . Hypertension Sister   . Cancer Sister        lung  . Asthma Brother   . COPD Brother   . Cancer Maternal Grandmother        breast   . Asthma Brother   . Diabetes Mellitus II Daughter   . COPD Daughter     Social History Social History   Tobacco Use  . Smoking status: Never Smoker  . Smokeless tobacco: Never Used  Vaping Use  . Vaping Use: Never used  Substance Use Topics  . Alcohol use: No    Alcohol/week: 0.0 standard drinks  . Drug use: No    Review of Systems  Constitutional: Negative for fever. + Generalized weakness Eyes: Negative for visual changes. ENT: Negative for sore throat. Neck: No neck pain  Cardiovascular: Negative for chest pain. Respiratory: Negative for shortness of breath. Gastrointestinal: + abdominal pain, vomiting and diarrhea. Genitourinary: Negative for dysuria. Musculoskeletal: Negative for back pain. Skin: Negative for rash. Neurological: Negative for headaches, weakness or numbness. Psych: No SI or HI  ____________________________________________   PHYSICAL EXAM:  VITAL SIGNS: ED Triage Vitals [11/22/19 2139]  Enc Vitals Group     BP 134/64     Pulse Rate 71     Resp 20     Temp 98.4 F (36.9 C)     Temp Source Oral     SpO2 98 %     Weight 138 lb (62.6 kg)     Height      Head Circumference      Peak Flow      Pain Score 0     Pain Loc      Pain Edu?      Excl. in GC?     Constitutional: Alert and oriented. Well appearing and in no apparent distress. HEENT:      Head: Normocephalic and atraumatic.         Eyes: Conjunctivae are normal. Sclera is non-icteric.       Mouth/Throat: Mucous membranes are dry.       Neck: Supple with no signs of meningismus. Cardiovascular: Regular rate and rhythm. No murmurs, gallops, or rubs. 2+ symmetrical distal pulses are present in all extremities. No JVD. Respiratory: Normal respiratory effort. Lungs are clear to auscultation bilaterally.  No wheezes, crackles, or rhonchi.  Gastrointestinal: Soft, non tender, and non distended with positive bowel sounds. No rebound or guarding. Genitourinary: No CVA tenderness. Musculoskeletal: Nontender with normal range of motion in all extremities. No edema, cyanosis, or erythema of extremities. Neurologic: Normal speech and language. Face is symmetric. Moving all extremities. No gross focal neurologic deficits are appreciated. Skin: Skin is warm, dry and intact. No rash noted. Psychiatric: Mood and affect are normal. Speech and behavior are normal.  ____________________________________________   LABS (all labs ordered are listed, but only abnormal  results are displayed)  Labs Reviewed  CBC - Abnormal; Notable for the following components:      Result Value   WBC 22.7 (*)    Hemoglobin 11.4 (*)    HCT 35.3 (*)    Platelets 661 (*)    All other components within normal limits  COMPREHENSIVE METABOLIC PANEL - Abnormal; Notable for the following components:   CO2 17 (*)    Glucose, Bld 315 (*)    BUN 52 (*)    Creatinine, Ser 3.42 (*)    Albumin 2.6 (*)    GFR calc non Af Amer 12 (*)    GFR calc Af Amer 14 (*)    Anion gap 16 (*)    All other components within normal limits  CBC WITH DIFFERENTIAL/PLATELET - Abnormal; Notable for the following components:   WBC 22.5 (*)    Hemoglobin 11.4 (*)    HCT 35.7 (*)    Platelets 701 (*)    Neutro Abs 19.5 (*)    Monocytes Absolute 1.2 (*)    Abs Immature Granulocytes 0.23 (*)    All other components within normal limits  SARS CORONAVIRUS 2 BY RT PCR (HOSPITAL ORDER, [facility])  CULTURE, BLOOD (ROUTINE X 2)  CULTURE, BLOOD (ROUTINE X 2)  C DIFFICILE QUICK SCREEN W PCR REFLEX  URINALYSIS, COMPLETE (UACMP) WITH MICROSCOPIC  LACTIC ACID, PLASMA  LACTIC ACID, PLASMA  PROCALCITONIN  BLOOD GAS, VENOUS  TROPONIN I (HIGH SENSITIVITY)  TROPONIN I (HIGH SENSITIVITY)    ____________________________________________  EKG  ED ECG REPORT I, Rudene Re, the attending physician, personally viewed and interpreted this ECG.  Normal sinus rhythm, rate of 74, normal intervals, normal axis, no ST elevations or depressions. ____________________________________________  RADIOLOGY  I have personally reviewed the images performed during this visit and I agree with the Radiologist's read.   Interpretation by Radiologist:  DG Chest 2 View  Result Date: 11/23/2019 CLINICAL DATA:  Weakness EXAM: CHEST - 2 VIEW COMPARISON:  None. FINDINGS: The heart size and mediastinal contours are within normal limits. Both lungs are clear. The visualized skeletal structures are unremarkable. IMPRESSION: No active cardiopulmonary disease. Electronically Signed   By: Ulyses Jarred M.D.   On: 11/23/2019 00:07     ____________________________________________   PROCEDURES  Procedure(s) performed:yes .1-3 Lead EKG Interpretation Performed by: Rudene Re, MD Authorized by: Rudene Re, MD     Interpretation: non-specific     ECG rate assessment: normal     Rhythm: sinus rhythm     Ectopy: none     Critical Care performed: yes  CRITICAL CARE Performed by: Rudene Re  ?  Total critical care time: 40 min  Critical care time was exclusive of separately billable procedures and treating other patients.  Critical care was necessary to treat or prevent imminent or life-threatening deterioration.  Critical care was time spent personally by me on the following activities: development of treatment plan with patient and/or surrogate as well as nursing, discussions with consultants, evaluation of patient's response to treatment, examination of patient, obtaining history from patient or surrogate, ordering and performing treatments and interventions, ordering and review of laboratory studies, ordering and review of radiographic studies, pulse oximetry  and re-evaluation of patient's condition.  ____________________________________________   INITIAL IMPRESSION / ASSESSMENT AND PLAN / ED COURSE   84 y.o. female with a history of diabetes, hypertension, hyperlipidemia who presents for evaluation of nausea, vomiting, diarrhea.  Patient looks dry on exam but with normal  vital signs, afebrile with no tachycardia, hypotension, tachypnea, hypoxia.  Abdomen is soft with no distention or tenderness, lungs are clear to auscultation.  Chest x-ray with no evidence of pneumonia, confirmed by radiology.  Covid swab is negative.  Labs showing pretty significant leukocytosis with white count of 22.5 and a left shift, thrombocytosis with platelets of 701.  Also showing pretty significant acute on chronic kidney injury with a creatinine of 3.42.  Patient is hyperglycemic with a glucose of 315 and an anion gap of 16.  Will hydrate IV, get VBG, UA, C. Diff, CT a/p to rule out colitis/ diverticulitis. Will admit to Hospitalist.  History gathered from patient and her daughters at bedside, plan discussed with both of them, old medical records reviewed.  _________________________ 6:53 AM on 11/23/2019 -----------------------------------------  Tests listed above pending. Care transferred to incoming MD    _____________________________________________ Please note:  Patient was evaluated in Emergency Department today for the symptoms described in the history of present illness. Patient was evaluated in the context of the global COVID-19 pandemic, which necessitated consideration that the patient might be at risk for infection with the SARS-CoV-2 virus that causes COVID-19. Institutional protocols and algorithms that pertain to the evaluation of patients at risk for COVID-19 are in a state of rapid change based on information released by regulatory bodies including the CDC and federal and state organizations. These policies and algorithms were followed during the patient's  care in the ED.  Some ED evaluations and interventions may be delayed as a result of limited staffing during the pandemic.   [place] Controlled Substance Database was reviewed by me. ____________________________________________   FINAL CLINICAL IMPRESSION(S) / ED DIAGNOSES   Final diagnoses:  Nausea vomiting and diarrhea  Severe dehydration  Acute kidney injury superimposed on chronic kidney disease ([place])  Bandemia  Uremia      NEW MEDICATIONS STARTED DURING THIS VISIT:  ED Discharge Orders    None       Note:  This document was prepared using Dragon voice recognition software and may include unintentional dictation errors.    Alfred Levins, [place], MD 11/23/19 [phone]

## 2019-11-23 NOTE — H&P (Signed)
History and Physical    Amanda Benson AGT:[id number] DOB: [birth year] DOA: 11/23/2019  PCP: Judy Pimple, MD   Patient coming from: Home  I have personally briefly reviewed patient's old medical records in [facility]   Chief Complaint: Weakness  HPI: Amanda Benson is a 84 y.o. female with medical history significant for diabetes mellitus, hypertension, dyslipidemia who presents to the ER for evaluation of a 2-week history of nausea, vomiting and diarrhea.  Patient states that she she has had several episodes of watery diarrhea on a daily basis associated with episodes of nonbloody, nonbilious emesis.  She also had associated abdominal cramping that has resolved.  She presents to the ER for evaluation of generalized weakness and extreme fatigue but denies having any falls or any loss of consciousness.  She has had poor appetite for the past 2 days.  Patient was exposed to someone with a Covid infection 3 weeks ago and is unvaccinated.  Her COVID-19 PCR test is negative.  She denies any recent antibiotic use Patient states she had dysuria for couple of days but it has resolved. Labs show a white count of 22.5 with a left shift, hemoglobin 11.4, hematocrit 35.7, RDW 12.5, platelet count 700, lactic acid 3.0 >> 2.8, procalcitonin 0.33, sodium 135, potassium 4, bicarb of 17, serum glucose of 315, BUN of 52, creatinine of 3.42 with a baseline serum creatinine of 1.1. Patient had a CT scan of abdomen and pelvis which shows extensive colonic diverticulosis without evidence of diverticulitis.  Moderate sized hiatal hernia with wall thickening in the hernia sac.  Thickened anterior superior bladder wall with adjacent infiltrative change which could represent cystitis though tumor not excluded with this appearance recommend correlation with urinalysis. Chest x-ray reviewed by me does not show any infiltrate or effusion. Twelve-lead EKG shows sinus rhythm with LVH   ED Course: Patient is an  84 year old female who presents to the ER for evaluation of generalized weakness and fatigue.  She has had a 2-week history of nausea, vomiting and diarrhea.  Labs reveal evidence of an acute kidney injury, serum creatinine at baseline is 1.1 and today on admission it is 3.4.  Lactic acid is also elevated and she also has leukocytosis as well as an anion gap metabolic acidosis.  CT scan of abdomen and pelvis shows findings suggestive of cystitis with mild pyuria. she will be admitted to the hospital for further evaluation.  Review of Systems: As per HPI otherwise 10 point review of systems negative.    Past Medical History:  Diagnosis Date  . Depression   . Diabetes mellitus    type II  . Hyperlipidemia   . Hypertension   . Obesity   . Osteoporosis     Past Surgical History:  Procedure Laterality Date  . ANKLE SURGERY     left medial malleblus fracture  . CHOLECYSTECTOMY       reports that she has never smoked. She has never used smokeless tobacco. She reports that she does not drink alcohol and does not use drugs.  Allergies  Allergen Reactions  . Alendronate Sodium     REACTION: heartburn    Family History  Problem Relation Age of Onset  . Diabetes Mother   . Hypertension Mother   . Asthma Father   . Hypertension Sister   . Cancer Sister        lung  . Asthma Brother   . COPD Brother   . Cancer Maternal Grandmother  breast   . Asthma Brother   . Diabetes Mellitus II Daughter   . COPD Daughter      Prior to Admission medications   Medication Sig Start Date End Date Taking? Authorizing Provider  amLODipine (NORVASC) 10 MG tablet TAKE 1 TABLET(10 MG) BY MOUTH DAILY 10/03/19   Tower, [name], MD  aspirin 81 MG EC tablet Take 81 mg by mouth daily.      [provider]  Blood Glucose Monitoring Suppl (ONE TOUCH ULTRA SYSTEM KIT) W/DEVICE KIT To check sugar daily and as needed for DM2 250.0  12/23/10   Tower, Roque Lias A, MD  buPROPion (WELLBUTRIN XL) 300 MG  24 hr tablet TAKE 1 TABLET(300 MG) BY MOUTH DAILY 11/12/19   Tower, [name], MD  FLUoxetine (PROZAC) 40 MG capsule TAKE 1 CAPSULE(40 MG) BY MOUTH DAILY 10/03/19   Tower, Mar[place] A, MD  glipiZIDE (GLUCOTROL XL) 10 MG 24 hr tablet TAKE 1 TABLET(10 MG) BY MOUTH DAILY 10/03/19   Tower, [place] A, MD  glucose blood (ONE TOUCH ULTRA TEST) test strip Use to check blood sugar once daily (Dx. E11.9) 05/23/17   Tower, [name], MD  JANUVIA 50 MG tablet TAKE 1 TABLET(50 MG) BY MOUTH DAILY 10/03/19   Tower, [name], MD  losartan-hydrochlorothiazide (HYZAAR) 100-25 MG tablet TAKE 1 TABLET BY MOUTH DAILY 10/03/19   Tower, [place] A, MD  metFORMIN (GLUCOPHAGE) 1000 MG tablet TAKE 1 TABLET(1000 MG) BY MOUTH TWICE DAILY WITH A MEAL 11/12/19   Tower, [place] A, MD  metoprolol succinate (TOPROL-XL) 25 MG 24 hr tablet TAKE 1 TABLET(25 MG) BY MOUTH DAILY 10/03/19   Tower, [name], MD  raloxifene (EVISTA) 60 MG tablet TAKE 1 TABLET(60 MG) BY MOUTH DAILY 11/12/19   Tower, [name], MD  simvastatin (ZOCOR) 20 MG tablet TAKE 1 TABLET(20 MG) BY MOUTH DAILY 11/12/19   Abner Greenspan, MD    Physical Exam: Vitals:   11/23/19 1230 11/23/19 1300 11/23/19 1330 11/23/19 1400  BP: (!) 155/49 (!) 156/54 (!) 164/66 (!) 154/63  Pulse: 77 76 77 79  Resp:      Temp:      TempSrc:      SpO2: 100% 100% 100% 100%  Weight:         Vitals:   11/23/19 1230 11/23/19 1300 11/23/19 1330 11/23/19 1400  BP: (!) 155/49 (!) 156/54 (!) 164/66 (!) 154/63  Pulse: 77 76 77 79  Resp:      Temp:      TempSrc:      SpO2: 100% 100% 100% 100%  Weight:        Constitutional: NAD, alert and oriented x 3.  Acutely ill-appearing Eyes: PERRL, lids and conjunctivae normal ENMT: Mucous membranes are dry no I just did not want to provide you want to keep listening Neck: normal, supple, no masses, no thyromegaly Respiratory: clear to auscultation bilaterally, no wheezing, no crackles. Normal respiratory effort. No accessory muscle use.  Cardiovascular: Regular rate  and rhythm, no murmurs / rubs / gallops. No extremit deficits y edema. 2+ pedal pulses. No carotid bruits.  Abdomen: no tenderness, no masses palpated. No hepatosplenomegaly. Bowel sounds positive.  Musculoskeletal: no clubbing / cyanosis. No joint deformity upper and lower extremities.  Skin: no rashes, lesions, ulcers.  Neurologic: No gross focal neurologic deficit.  Generalized weakness p Psychiatric: Flat affect.   Labs on Admission: I have personally reviewed following labs and imaging studies  CBC: Recent Labs  Lab 11/22/19 2144  WBC 22.5*  22.7*  NEUTROABS 19.5*  HGB 11.4*  11.4*  HCT 35.7*  35.3*  MCV 90.2  89.1  PLT 701*  027*   Basic Metabolic Panel: Recent Labs  Lab 11/22/19 2144  NA 135  K 4.0  CL 102  CO2 17*  GLUCOSE 315*  BUN 52*  CREATININE 3.42*  CALCIUM 9.0   GFR: Estimated Creatinine Clearance: 11.2 mL/min (A) (by C-G formula based on SCr of 3.42 mg/dL (H)). Liver Function Tests: Recent Labs  Lab 11/22/19 2144  AST 19  ALT 11  ALKPHOS 63  BILITOT 0.5  PROT 7.0  ALBUMIN 2.6*   No results for input(s): LIPASE, AMYLASE in the last 168 hours. No results for input(s): AMMONIA in the last 168 hours. Coagulation Profile: No results for input(s): INR, PROTIME in the last 168 hours. Cardiac Enzymes: No results for input(s): CKTOTAL, CKMB, CKMBINDEX, TROPONINI in the last 168 hours. BNP (last 3 results) No results for input(s): PROBNP in the last 8760 hours. HbA1C: No results for input(s): HGBA1C in the last 72 hours. CBG: No results for input(s): GLUCAP in the last 168 hours. Lipid Profile: No results for input(s): CHOL, HDL, LDLCALC, TRIG, CHOLHDL, LDLDIRECT in the last 72 hours. Thyroid Function Tests: No results for input(s): TSH, T4TOTAL, FREET4, T3FREE, THYROIDAB in the last 72 hours. Anemia Panel: No results for input(s): VITAMINB12, FOLATE, FERRITIN, TIBC, IRON, RETICCTPCT in the last 72 hours. Urine analysis:    Component Value  Date/Time   COLORURINE YELLOW (A) 11/23/2019 1224   APPEARANCEUR TURBID (A) 11/23/2019 1224   LABSPEC 1.013 11/23/2019 1224   PHURINE 5.0 11/23/2019 1224   GLUCOSEU 50 (A) 11/23/2019 1224   HGBUR SMALL (A) 11/23/2019 1224   HGBUR trace-intact 05/01/2008 1218   BILIRUBINUR NEGATIVE 11/23/2019 1224   BILIRUBINUR neg 10/12/2019 1215   KETONESUR NEGATIVE 11/23/2019 1224   PROTEINUR 100 (A) 11/23/2019 1224   UROBILINOGEN 0.2 10/12/2019 1215   UROBILINOGEN 0.2 05/01/2008 1218   NITRITE NEGATIVE 11/23/2019 1224   LEUKOCYTESUR SMALL (A) 11/23/2019 1224    Radiological Exams on Admission: CT ABDOMEN PELVIS WO CONTRAST  Result Date: 11/23/2019 CLINICAL DATA:  Acute nonlocalized abdominal pain, generalized weakness, and decreased appetite for 2 days EXAM: CT ABDOMEN AND PELVIS WITHOUT CONTRAST TECHNIQUE: Multidetector CT imaging of the abdomen and pelvis was performed following the standard protocol without IV contrast. Sagittal and coronal MPR images reconstructed from axial data set. No oral contrast administered. COMPARISON:  None FINDINGS: Lower chest: Lung bases emphysematous but clear Hepatobiliary: Gallbladder surgically absent.  Liver unremarkable. Pancreas: Normal appearance Spleen: Small but otherwise normal appearance Adrenals/Urinary Tract: BILATERAL adrenal gland thickening without discrete mass. Tiny nonobstructing LEFT renal calculus. Cortical scarring at anterior inferior LEFT kidney. No renal mass, hydronephrosis, or hydroureter. Thickened anterior superior bladder wall with adjacent infiltrative change, could represent cystitis though tumor not excluded with this appearance. Stomach/Bowel: Normal appendix. Large duodenal diverticulum. Moderate-sized hiatal hernia with wall thickening in the hernia sac, question artifact from underdistention, cannot exclude mass. Associated thickening of the distal esophageal wall. Remainder stomach decompressed. Diverticulosis of transverse through  sigmoid colon with single diverticula noted at ascending colon. No colonic wall thickening or pericolic infiltrative changes to suggest acute diverticulitis. Remaining bowel loops unremarkable. Vascular/Lymphatic: Extensive atherosclerotic calcifications aorta, visceral arteries, iliac arteries. 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 Multiple pelvic phleboliths. No adenopathy. Reproductive: Unremarkable uterus, ovaries, and adnexa Other: No free air or free fluid. Small cyst identified within LEFT perineum  at LEFT labia, question Bartholin cyst 1.8 x 1.3 cm. Musculoskeletal: Osseous demineralization with degenerative disc and facet disease changes of lower lumbar spine associated with grade 1 anterolisthesis L4-L5. IMPRESSION: Extensive colonic diverticulosis without evidence of diverticulitis. Moderate-sized hiatal hernia with wall thickening in the hernia sac, question artifact from underdistention, as well as thickening of distal esophageal wall; consider endoscopy to exclude gastric neoplasm if clinically indicated based on patient age and comorbidities. Tiny nonobstructing LEFT renal calculus. Thickened anterior superior bladder wall with adjacent infiltrative change, could represent cystitis though tumor not excluded with this appearance; recommend correlation with urinalysis. Small cyst within LEFT perineum at LEFT labia, question Bartholin cyst 1.8 x 1.3 cm. Aortic Atherosclerosis (ICD10-I70.0) and Emphysema (ICD10-J43.9). Electronically Signed   By: Lavonia Dana M.D.   On: 11/23/2019 08:13   DG Chest 2 View  Result Date: 11/23/2019 CLINICAL DATA:  Weakness EXAM: CHEST - 2 VIEW COMPARISON:  None. FINDINGS: The heart size and mediastinal contours are within normal limits. Both lungs are clear. The visualized skeletal structures are unremarkable. IMPRESSION: No active cardiopulmonary disease. Electronically Signed   By: Ulyses Jarred M.D.   On: 11/23/2019 00:07    EKG:  Independently reviewed.  Sinus rhythm LVH  Assessment/Plan Principal Problem:   DKA (diabetic ketoacidoses) ([place]) Active Problems:   Essential hypertension   Depression with anxiety   AKI (acute kidney injury) ([place])   Leukocytosis    DKA In a patient with type 2 diabetes mellitus on oral hypoglycemic agents who presents to the ER for evaluation of nausea, vomiting and poor oral intake for about 2 weeks. Patient has hyperglycemia as well as an anion gap metabolic acidosis We will place patient insulin per protocol Check electrolytes and supplement as needed Start patient on a clear liquid diet     Acute kidney injury Secondary to volume depletion from GI fluid losses With concomitant diuretic use Patient presents for evaluation of nausea, vomiting, diarrhea and poor oral intake Baseline serum creatinine is 1.1 and on admission it is 3.4. Patient had a CT scan of abdomen and pelvis which showed thickened anterior superior bladder wall with adjacent infiltrative change, could represent cystitis though tumor not excluded with this appearance; recommend correlation with urinalysis. We will obtain renal ultrasound for further evaluation   Leukocytosis Unclear etiology Patient has pyuria but it is mild and does not explain the marked leukocytosis She also has diarrhea and I am awaiting results of C. difficile toxin We will hold off on antibiotic therapy for now until results of stool studies are available   Depression Continue bupropion and fluoxetine   Hypertension Continue metoprolol and amlodipine    Lactic acidosis Most likely medication induced Patient is on Metformin No obvious source of sepsis at this time We will trend lactic acid levels Continue hydration   DVT prophylaxis: Heparin Code Status: DO NOT RESUSCITATE Family Communication: Greater than 50% of time discussing patient's condition and plan of care with her and her daughter at the bedside.  All  questions and concerns have been addressed.  They verbalized understanding and agree with the plan.  CODE STATUS was discussed and she is a DO NOT RESUSCITATE Disposition Plan: Back to previous home environment Consults called: None    Jacey Pelc MD [facility]     11/23/2019, 2:26 PM

## 2019-11-23 NOTE — ED Notes (Signed)
Pt repositioned in bed.

## 2019-11-24 DIAGNOSIS — D72829 Elevated white blood cell count, unspecified: Secondary | ICD-10-CM

## 2019-11-24 LAB — CBC
HCT: 32 % — ABNORMAL LOW (ref 36.0–46.0)
Hemoglobin: 10.6 g/dL — ABNORMAL LOW (ref 12.0–15.0)
MCH: 28.8 pg (ref 26.0–34.0)
MCHC: 33.1 g/dL (ref 30.0–36.0)
MCV: 87 fL (ref 80.0–100.0)
Platelets: 584 10*3/uL — ABNORMAL HIGH (ref 150–400)
RBC: 3.68 MIL/uL — ABNORMAL LOW (ref 3.87–5.11)
RDW: 12.6 % (ref 11.5–15.5)
WBC: 16.8 10*3/uL — ABNORMAL HIGH (ref 4.0–10.5)
nRBC: 0 % (ref 0.0–0.2)

## 2019-11-24 LAB — BASIC METABOLIC PANEL
Anion gap: 8 (ref 5–15)
BUN: 52 mg/dL — ABNORMAL HIGH (ref 8–23)
CO2: 24 mmol/L (ref 22–32)
Calcium: 8.4 mg/dL — ABNORMAL LOW (ref 8.9–10.3)
Chloride: 106 mmol/L (ref 98–111)
Creatinine, Ser: 2.8 mg/dL — ABNORMAL HIGH (ref 0.44–1.00)
GFR calc Af Amer: 17 mL/min — ABNORMAL LOW (ref >60)
GFR calc non Af Amer: 15 mL/min — ABNORMAL LOW (ref >60)
Glucose, Bld: 232 mg/dL — ABNORMAL HIGH (ref 70–99)
Potassium: 3.8 mmol/L (ref 3.5–5.1)
Sodium: 138 mmol/L (ref 135–145)

## 2019-11-24 LAB — BETA-HYDROXYBUTYRIC ACID: Beta-Hydroxybutyric Acid: 0.86 mmol/L — ABNORMAL HIGH (ref 0.05–0.27)

## 2019-11-24 LAB — C DIFFICILE QUICK SCREEN W PCR REFLEX
C Diff antigen: NEGATIVE
C Diff interpretation: NOT DETECTED
C Diff toxin: NEGATIVE

## 2019-11-24 LAB — PROCALCITONIN: Procalcitonin: 0.11 ng/mL

## 2019-11-24 LAB — GLUCOSE, CAPILLARY
Glucose-Capillary: 192 mg/dL — ABNORMAL HIGH (ref 70–99)
Glucose-Capillary: 282 mg/dL — ABNORMAL HIGH (ref 70–99)
Glucose-Capillary: 168 mg/dL — ABNORMAL HIGH (ref 70–99)

## 2019-11-24 MED ORDER — INSULIN DETEMIR 100 UNIT/ML ~~LOC~~ SOLN
10.0000 [IU] | Freq: Every day | SUBCUTANEOUS | Status: DC
  Administered 2019-11-24 – 2019-11-30 (×7): 10 [IU] via SUBCUTANEOUS
  Filled 2019-11-24 (×7): qty 0.1

## 2019-11-24 MED ORDER — SODIUM CHLORIDE 0.9 % IV SOLN
1.0000 g | INTRAVENOUS | Status: DC
  Administered 2019-11-24 – 2019-11-25 (×2): 1 g via INTRAVENOUS
  Filled 2019-11-24: qty 10
  Filled 2019-11-24: qty 1

## 2019-11-24 MED ORDER — SODIUM CHLORIDE 0.9 % IV SOLN: Freq: Once | INTRAVENOUS | Status: AC

## 2019-11-24 MED ORDER — RISAQUAD PO CAPS
1.0000 | ORAL_CAPSULE | Freq: Three times a day (TID) | ORAL | Status: DC
  Administered 2019-11-24 – 2019-11-25 (×3): 1 via ORAL
  Filled 2019-11-24 (×9): qty 1

## 2019-11-24 NOTE — Progress Notes (Signed)
PROGRESS NOTE    Patient: Amanda Benson                            PCP: Judy Pimple, MD                    DOB: [birth year]            DOA: 11/23/2019 WOE:[id number]             DOS: 11/24/2019, 8:09 AM   LOS: 1 day   Date of Service: The patient was seen and examined on 11/24/2019  Subjective:   The patient was seen and examined this morning, stable awake alert oriented following command. Reporting her symptoms is progressively worse as far as generalized weaknesses, diarrhea which led to poor p.o. intake,... Reporting improved nausea vomiting. Currently hemodynamically stable  Daughter present at bedside updated Patient presented from home.  Brief Narrative:   Amanda Benson is a 84 y.o. female with medical history significant for diabetes mellitus, hypertension, dyslipidemia who presents to the ER for evaluation of generalized weakness and fatigue.  She has had a 2-week history of nausea, vomiting and diarrhea.  Labs reveal evidence of an acute kidney injury, serum creatinine at baseline is 1.1 and today on admission it is 3.4.  Lactic acid is also elevated and she also has leukocytosis as well as an anion gap metabolic acidosis, in DKA.   Her COVID-19 PCR test is negative.   CT scan of abdomen and pelvis shows findings suggestive of cystitis with mild pyuria. she will be admitted to the hospital for further evaluation    Assessment & Plan:   Principal Problem:   DKA (diabetic ketoacidoses) (HCC) Active Problems:   Essential hypertension   Depression with anxiety   AKI (acute kidney injury) (HCC)   Leukocytosis    Diabetic ketoacidosis Much improved metabolic diabetic acidosis, lactic acidosis -In the setting of uncontrolled diabetes mellitus, hyperglycemia Patient responded well to IV fluid hydration, insulin-- Blood sugar has improved with IV fluid resuscitation, insulin drip was not pursued per DKA protocol Continue with IV fluid resuscitation normal  saline -Anion gap is closed -initiating Levemir, SSI, -Initiating diabetic diet   Check electrolytes and supplement as needed -But holding home medication of Metformin    Acute kidney injury Secondary to volume depletion from GI fluid losses, nausea vomiting diarrhea, poor p.o. intake With concomitant diuretic use... Withholding  Baseline serum creatinine is 1.1 and on admission it is 3.4 >> 2.80,. Patient had a CT scan of abdomen and pelvis which showed thickened anterior superior bladder wall with adjacent infiltrative change, could represent cystitis though tumor not excluded with this appearance; recommend correlation with urinalysis. -UA revealed small amount leukocyte Estrace, negative for nitrates no bacteria greater than 50 WBCs  Renal ultrasound for further evaluation IMPRESSION: Study within normal limits on the right. Normal size and echogenicity. No hydronephrosis.  On the left, there is some scarring/volume loss at the lower pole but the kidney appears otherwise normal sonographically.  Tiny nonobstructing stones seen by CT not identified by ultrasound.  Leukocytosis -possible early UTI Unclear etiology -blood urine culture has been obtained will follow accordingly Stool for C. difficile negative Initiate empiric antibiotics of Rocephin Patient has pyuria but it is mild and does not explain the marked leukocytosis   Depression Continue bupropion and fluoxetine   Hypertension Continue metoprolol and amlodipine     Consultants: None   ---------------------------------------------------------------------------------------------------------------------------------------------  DVT prophylaxis:  SCD/Compression stockings and Heparin SQ Code Status:   Code Status: DNR Family Communication: No family member present at bedside- attempt will be made to update daily The above findings and plan of care has been discussed with patient (and family )  in  detail,  they expressed understanding and agreement of above. -Advance care planning has been discussed.   Admission status:    Status is: Inpatient  Remains inpatient appropriate because:Inpatient level of care appropriate due to severity of illness   Dispo: The patient is from: Home              Anticipated d/c is to: Home              Anticipated d/c date is: 3 days              Patient currently is not medically stable to d/c.        Procedures:   No admission procedures for hospital encounter.     Antimicrobials:  Anti-infectives (From admission, onward)   Start     Dose/Rate Route Frequency Ordered Stop   11/23/19 1015  cefTRIAXone (ROCEPHIN) 1 g in sodium chloride 0.9 % 100 mL IVPB        1 g 200 mL/hr over 30 Minutes Intravenous  Once 11/23/19 1003 11/23/19 1259       Medication:  . amLODipine  10 mg Oral Daily  . aspirin EC  81 mg Oral Daily  . buPROPion  300 mg Oral Daily  . FLUoxetine  40 mg Oral Daily  . heparin  5,000 Units Subcutaneous Q8H  . insulin aspart  0-15 Units Subcutaneous TID WC  . insulin aspart  0-5 Units Subcutaneous QHS  . metoprolol succinate  25 mg Oral Daily  . raloxifene  60 mg Oral Daily  . simvastatin  20 mg Oral q1800    acetaminophen **OR** acetaminophen, dextrose, ondansetron **OR** ondansetron (ZOFRAN) IV   Objective:   Vitals:   11/23/19 2000 11/23/19 2030 11/23/19 2100 11/23/19 2130  BP: (!) 138/54 (!) 141/54 133/60 (!) 140/53  Pulse: 77 75 73 72  Resp: (!) 23 (!) 21 (!) 23 14  Temp:      TempSrc:      SpO2: 98% 99% 99% 99%  Weight:        Intake/Output Summary (Last 24 hours) at 11/24/2019 0809 Last data filed at 11/23/2019 2137 Gross per 24 hour  Intake 2200 ml  Output --  Net 2200 ml   Filed Weights   11/22/19 2139  Weight: 62.6 kg     Examination:   Physical Exam  Constitution:  Alert, cooperative, no distress,  Appears calm and comfortable  Psychiatric: Normal and stable mood and affect,  cognition intact,   HEENT: Normocephalic, PERRL, otherwise with in Normal limits  Chest:Chest symmetric Cardio vascular:  S1/S2, RRR, No murmure, No Rubs or Gallops  pulmonary: Clear to auscultation bilaterally, respirations unlabored, negative wheezes / crackles Abdomen: Soft, non-tender, non-distended, bowel sounds,no masses, no organomegaly Muscular skeletal: Limited exam - in bed, able to move all 4 extremities, Normal strength,  Neuro: CNII-XII intact. , normal motor and sensation, reflexes intact  Extremities: No pitting edema lower extremities, +2 pulses  Skin: Dry, warm to touch, negative for any Rashes, No open wounds Wounds: per nursing documentation    ------------------------------------------------------------------------------------------------------------------------------------------    LABs:  CBC Latest Ref Rng & Units 11/24/2019 11/22/2019 11/22/2019  WBC 4.0 - 10.5 K/uL 16.8(H) 22.5(H) 22.7(H)  Hemoglobin 12.0 -  15.0 g/dL 10.6(L) 11.4(L) 11.4(L)  Hematocrit 36 - 46 % 32.0(L) 35.7(L) 35.3(L)  Platelets 150 - 400 K/uL 584(H) 701(H) 661(H)   CMP Latest Ref Rng & Units 11/24/2019 11/23/2019 11/23/2019  Glucose 70 - 99 mg/dL 960(A) 540(J) 811(B)  BUN 8 - 23 mg/dL 14(N) 82(N) 56(O)  Creatinine 0.44 - 1.00 mg/dL 1.30(Q) 6.57(Q) 4.69(G)  Sodium 135 - 145 mmol/L 138 138 136  Potassium 3.5 - 5.1 mmol/L 3.8 3.8 3.9  Chloride 98 - 111 mmol/L 106 103 102  CO2 22 - 32 mmol/L 24 22 22   Calcium 8.9 - 10.3 mg/dL ) 2.9(B) 2.8(U)  Total Protein 6.5 - 8.1 g/dL - - -  Total Bilirubin 0.3 - 1.2 mg/dL - - -  Alkaline Phos 38 - 126 U/L - - -  AST 15 - 41 U/L - - -  ALT 0 - 44 U/L - - -       Micro Results Recent Results (from the past 240 hour(s))  SARS Coronavirus 2 by RT PCR (hospital order, performed in [facility] hospital lab) Nasopharyngeal Nasopharyngeal Swab     Status: None   Collection Time: 11/23/19 12:00 AM   Specimen: Nasopharyngeal Swab  Result Value Ref Range  Status   SARS Coronavirus 2 NEGATIVE NEGATIVE Final    Comment: (NOTE) SARS-CoV-2 target nucleic acids are NOT DETECTED.  The SARS-CoV-2 RNA is generally detectable in upper and lower respiratory specimens during the acute phase of infection. The lowest concentration of SARS-CoV-2 viral copies this assay can detect is 250 copies / mL. A negative result does not preclude SARS-CoV-2 infection and should not be used as the sole basis for treatment or other patient management decisions.  A negative result may occur with improper specimen collection / handling, submission of specimen other than nasopharyngeal swab, presence of viral mutation(s) within the areas targeted by this assay, and inadequate number of viral copies (<250 copies / mL). A negative result must be combined with clinical observations, patient history, and epidemiological information.  Fact Sheet for Patients:   11/25/19  Fact Sheet for Healthcare Providers: BoilerBrush.com.cy  This test is not yet approved or  cleared by the https://pope.com/ FDA and has been authorized for detection and/or diagnosis of SARS-CoV-2 by FDA under an Emergency Use Authorization (EUA).  This EUA will remain in effect (meaning this test can be used) for the duration of the COVID-19 declaration under Section 564(b)(1) of the Act, 21 U.S.C. section 360bbb-3(b)(1), unless the authorization is terminated or revoked sooner.  Performed at [facility], [address], [place], [place]     Radiology Reports CT ABDOMEN PELVIS WO CONTRAST  Result Date: 11/23/2019 CLINICAL DATA:  Acute nonlocalized abdominal pain, generalized weakness, and decreased appetite for 2 days EXAM: CT ABDOMEN AND PELVIS WITHOUT CONTRAST TECHNIQUE: Multidetector CT imaging of the abdomen and pelvis was performed following the standard protocol without IV contrast. Sagittal and coronal MPR images  reconstructed from axial data set. No oral contrast administered. COMPARISON:  None FINDINGS: Lower chest: Lung bases emphysematous but clear Hepatobiliary: Gallbladder surgically absent.  Liver unremarkable. Pancreas: Normal appearance Spleen: Small but otherwise normal appearance Adrenals/Urinary Tract: BILATERAL adrenal gland thickening without discrete mass. Tiny nonobstructing LEFT renal calculus. Cortical scarring at anterior inferior LEFT kidney. No renal mass, hydronephrosis, or hydroureter. Thickened anterior superior bladder wall with adjacent infiltrative change, could represent cystitis though tumor not excluded with this appearance. Stomach/Bowel: Normal appendix. Large duodenal diverticulum. Moderate-sized hiatal hernia with wall thickening in  the hernia sac, question artifact from underdistention, cannot exclude mass. Associated thickening of the distal esophageal wall. Remainder stomach decompressed. Diverticulosis of transverse through sigmoid colon with single diverticula noted at ascending colon. No colonic wall thickening or pericolic infiltrative changes to suggest acute diverticulitis. Remaining bowel loops unremarkable. Vascular/Lymphatic: Extensive atherosclerotic calcifications aorta, visceral arteries, iliac arteries. 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 Multiple pelvic phleboliths. No adenopathy. Reproductive: Unremarkable uterus, ovaries, and adnexa Other: No free air or free fluid. Small cyst identified within LEFT perineum at LEFT labia, question Bartholin cyst 1.8 x 1.3 cm. Musculoskeletal: Osseous demineralization with degenerative disc and facet disease changes of lower lumbar spine associated with grade 1 anterolisthesis L4-L5. IMPRESSION: Extensive colonic diverticulosis without evidence of diverticulitis. Moderate-sized hiatal hernia with wall thickening in the hernia sac, question artifact from underdistention, as well as thickening of distal  esophageal wall; consider endoscopy to exclude gastric neoplasm if clinically indicated based on patient age and comorbidities. Tiny nonobstructing LEFT renal calculus. Thickened anterior superior bladder wall with adjacent infiltrative change, could represent cystitis though tumor not excluded with this appearance; recommend correlation with urinalysis. Small cyst within LEFT perineum at LEFT labia, question Bartholin cyst 1.8 x 1.3 cm. Aortic Atherosclerosis (ICD10-I70.0) and Emphysema (ICD10-J43.9). Electronically Signed   By: Ulyses SouthwardMark  Boles M.D.   On: 11/23/2019 08:13   DG Chest 2 View  Result Date: 11/23/2019 CLINICAL DATA:  Weakness EXAM: CHEST - 2 VIEW COMPARISON:  None. FINDINGS: The heart size and mediastinal contours are within normal limits. Both lungs are clear. The visualized skeletal structures are unremarkable. IMPRESSION: No active cardiopulmonary disease. Electronically Signed   By: Deatra RobinsonKevin  Herman M.D.   On: 11/23/2019 00:07   US RENAL  Result Date: 11/23/2019 CLINICAL DATA:  Acute kidney injury. EXAM: RENAL / URINARY TRACT ULTRASOUND COMPLETE COMPARISON:  CT same day FINDINGS: Right Kidney: Renal measurements: 12.2 x 5.1 x 5.3 cm = volume: 183 mL. Echogenicity within normal limits. No mass or hydronephrosis visualized. Left Kidney: Renal measurements: 10.9 x 5.3 x 4.5 cm = volume: 135 mL. Echogenicity within normal limits. No mass or hydronephrosis visualized. Mild volume loss lower pole. Bladder: Appears normal for degree of bladder distention. Other: None. IMPRESSION: Study within normal limits on the right. Normal size and echogenicity. No hydronephrosis. On the left, there is some scarring/volume loss at the lower pole but the kidney appears otherwise normal sonographically. Tiny nonobstructing stones seen by CT not identified by ultrasound. Electronically Signed   By: Paulina FusiMark  Shogry M.D.   On: 11/23/2019 15:06    SIGNED: Kendell BaneSeyed A Cheyenne Bordeaux, MD, FACP, FHM. [facility],  Pager  (please use [email or web address] to page/text)  If 7PM-7AM, please contact night-coverage [email or web address], Password [email or web address] 11/24/2019, 8:10 AM

## 2019-11-24 NOTE — ED Notes (Signed)
Pt cleansed of stool and new brief and purewik applied.

## 2019-11-25 ENCOUNTER — Encounter: Payer: Self-pay | Admitting: Family Medicine

## 2019-11-25 LAB — CBC
HCT: 29.3 % — ABNORMAL LOW (ref 36.0–46.0)
Hemoglobin: 10 g/dL — ABNORMAL LOW (ref 12.0–15.0)
MCH: 28.9 pg (ref 26.0–34.0)
MCHC: 34.1 g/dL (ref 30.0–36.0)
MCV: 84.7 fL (ref 80.0–100.0)
Platelets: 572 10*3/uL — ABNORMAL HIGH (ref 150–400)
RBC: 3.46 MIL/uL — ABNORMAL LOW (ref 3.87–5.11)
RDW: 12.8 % (ref 11.5–15.5)
WBC: 15.2 10*3/uL — ABNORMAL HIGH (ref 4.0–10.5)
nRBC: 0 % (ref 0.0–0.2)

## 2019-11-25 LAB — PROCALCITONIN: Procalcitonin: 0.13 ng/mL

## 2019-11-25 LAB — GLUCOSE, CAPILLARY
Glucose-Capillary: 289 mg/dL — ABNORMAL HIGH (ref 70–99)
Glucose-Capillary: 162 mg/dL — ABNORMAL HIGH (ref 70–99)

## 2019-11-25 LAB — BASIC METABOLIC PANEL
Anion gap: 10 (ref 5–15)
BUN: 43 mg/dL — ABNORMAL HIGH (ref 8–23)
CO2: 21 mmol/L — ABNORMAL LOW (ref 22–32)
Calcium: 7.9 mg/dL — ABNORMAL LOW (ref 8.9–10.3)
Chloride: 106 mmol/L (ref 98–111)
Creatinine, Ser: 2.05 mg/dL — ABNORMAL HIGH (ref 0.44–1.00)
GFR calc Af Amer: 26 mL/min — ABNORMAL LOW (ref >60)
GFR calc non Af Amer: 22 mL/min — ABNORMAL LOW (ref >60)
Glucose, Bld: 169 mg/dL — ABNORMAL HIGH (ref 70–99)
Potassium: 3.5 mmol/L (ref 3.5–5.1)
Sodium: 137 mmol/L (ref 135–145)

## 2019-11-25 LAB — URINE CULTURE: Culture: <10000 — AB

## 2019-11-25 MED ORDER — SUCRALFATE 1 GM/10ML PO SUSP
1.0000 g | Freq: Three times a day (TID) | ORAL | Status: DC
  Administered 2019-11-25 – 2019-11-30 (×19): 1 g via ORAL
  Filled 2019-11-25 (×22): qty 10

## 2019-11-25 MED ORDER — PANTOPRAZOLE SODIUM 40 MG PO TBEC
40.0000 mg | DELAYED_RELEASE_TABLET | Freq: Every day | ORAL | Status: DC
  Administered 2019-11-25 – 2019-11-30 (×6): 40 mg via ORAL
  Filled 2019-11-25 (×6): qty 1

## 2019-11-25 NOTE — ED Notes (Signed)
Breakfast tray given. °

## 2019-11-25 NOTE — ED Notes (Signed)
Pt resting quietly at this time, respirations equal and unlabored.  family also resting at bedside. Will continue to monitor.

## 2019-11-25 NOTE — ED Notes (Signed)
Pt resting quietly at this time, respirations equal and unlabored, VSS, pt in NSR on monitor, will continue to monitor.

## 2019-11-25 NOTE — Progress Notes (Signed)
PROGRESS NOTE    Patient: Amanda Benson                            PCP: Judy Pimple, MD                    DOB: [birth year]            DOA: 11/23/2019 [id number]:[id number]             DOS: 11/25/2019, 10:35 AM   LOS: 2 days   Date of Service: The patient was seen and examined on 11/25/2019  Subjective:   The patient was seen and examined this morning, stable awake alert oriented following command. Reporting her symptoms is progressively worse as far as generalized weaknesses, diarrhea which led to poor p.o. intake,... Reporting improved nausea vomiting. Currently hemodynamically stable  Daughter present at bedside updated Patient presented from home.  Brief Narrative:   Amanda Benson is a 84 y.o. female with medical history significant for diabetes mellitus, hypertension, dyslipidemia who presents to the ER for evaluation of generalized weakness and fatigue.  She has had a 2-week history of nausea, vomiting and diarrhea.  Labs reveal evidence of an acute kidney injury, serum creatinine at baseline is 1.1 and today on admission it is 3.4.  Lactic acid is also elevated and she also has leukocytosis as well as an anion gap metabolic acidosis, in DKA.   Her COVID-19 PCR test is negative.   CT scan of abdomen and pelvis shows findings suggestive of cystitis with mild pyuria. she will be admitted to the hospital for further evaluation   Assessment & Plan:   Principal Problem:   DKA (diabetic ketoacidoses) (HCC) Active Problems:   Essential hypertension   Depression with anxiety   AKI (acute kidney injury) (HCC)   Leukocytosis    Diabetic ketoacidosis -resolved  -Last CBGs 192, 282, 168 -Tolerating p.o. -Discontinue IV fluids -Initiating basal insulin, Levemir, Initiating checking blood sugar QA CHS, with SSI coverage  Much improved metabolic diabetic acidosis, lactic acidosis -In the setting of uncontrolled diabetes mellitus, hyperglycemia  Blood sugar has improved with IV  fluid resuscitation, insulin drip was not pursued per DKA protocol Continue with IV fluid resuscitation normal saline -Anion gap is closed -initiating Levemir, SSI, -Initiating diabetic diet   Check electrolytes and supplement as needed -But holding home medication of Metformin    Acute kidney injury -Likely due to dehydration, volume depletion, GI loss nausea vomiting poor p.o. intake, DKA -We will holding home diuretics  Baseline serum creatinine is 1.1 and on admission it is 3.4 >> 2.80, >> 2.05 today Patient had a CT scan of abdomen and pelvis which showed thickened anterior superior bladder wall with adjacent infiltrative change, could represent cystitis though tumor not excluded with this appearance; recommend correlation with urinalysis. -UA revealed small amount leukocyte Estrace, negative for nitrates no bacteria greater than 50 WBCs  Renal ultrasound for further evaluation IMPRESSION: Study within normal limits on the right. Normal size and echogenicity. No hydronephrosis.  On the left, there is some scarring/volume loss at the lower pole but the kidney appears otherwise normal sonographically.  Tiny nonobstructing stones seen by CT not identified by ultrasound.  Leukocytosis -possible early UTI Remains afebrile normotensive, improved leukocytosis 16.8-->15.2 Unclear etiology -blood urine culture has been obtained will follow accordingly Stool for C. difficile negative Initiate empiric antibiotics of Rocephin Patient has pyuria but it is mild and does  not explain the marked leukocytosis   Depression Stable  continue bupropion and fluoxetine   Hypertension Monitoring closely, stable Continue metoprolol and amlodipine     Consultants: None   ---------------------------------------------------------------------------------------------------------------------------------------------  DVT prophylaxis:  SCD/Compression stockings and Heparin  SQ Code Status:   Code Status: DNR Family Communication: No family member present at bedside- attempt will be made to update daily The above findings and plan of care has been discussed with patient (and family )  in detail,  they expressed understanding and agreement of above. -Advance care planning has been discussed.   Admission status:    Status is: Inpatient  Remains inpatient appropriate because:Inpatient level of care appropriate due to severity of illness   Dispo: The patient is from: Home              Anticipated d/c is to: Home              Anticipated d/c date is: Likely in a.m. if tolerating p.o., if blood sugar improved, leukocytosis improved.              Patient currently is not medically stable to d/c.        Procedures:   No admission procedures for hospital encounter.     Antimicrobials:  Anti-infectives (From admission, onward)   Start     Dose/Rate Route Frequency Ordered Stop   11/24/19 1230  cefTRIAXone (ROCEPHIN) 1 g in sodium chloride 0.9 % 100 mL IVPB        1 g 200 mL/hr over 30 Minutes Intravenous Every 24 hours 11/24/19 1200     11/23/19 1015  cefTRIAXone (ROCEPHIN) 1 g in sodium chloride 0.9 % 100 mL IVPB        1 g 200 mL/hr over 30 Minutes Intravenous  Once 11/23/19 1003 11/23/19 1259       Medication:  . acidophilus  1 capsule Oral TID  . amLODipine  10 mg Oral Daily  . aspirin EC  81 mg Oral Daily  . buPROPion  300 mg Oral Daily  . FLUoxetine  40 mg Oral Daily  . heparin  5,000 Units Subcutaneous Q8H  . insulin aspart  0-15 Units Subcutaneous TID WC  . insulin aspart  0-5 Units Subcutaneous QHS  . insulin detemir  10 Units Subcutaneous Daily  . metoprolol succinate  25 mg Oral Daily  . raloxifene  60 mg Oral Daily  . simvastatin  20 mg Oral q1800    acetaminophen **OR** acetaminophen, dextrose, ondansetron **OR** ondansetron (ZOFRAN) IV   Objective:   Vitals:   11/25/19 0400 11/25/19 0600 11/25/19 0800 11/25/19 1000   BP: (!) 167/65 (!) 161/60 (!) 174/73 (!) 116/48  Pulse: 69 71 72   Resp: 15 20 18 14   Temp:      TempSrc:      SpO2: 97% 98% 96% 97%  Weight:        Intake/Output Summary (Last 24 hours) at 11/25/2019 1035 Last data filed at 11/25/2019 0544 Gross per 24 hour  Intake --  Output 1000 ml  Net -1000 ml   Filed Weights   11/22/19 2139  Weight: 62.6 kg     Examination:   Physical Exam  Constitution:  Alert, cooperative, no distress,  Appears calm and comfortable  Psychiatric: Normal and stable mood and affect, cognition intact,   HEENT: Normocephalic, PERRL, otherwise with in Normal limits  Chest:Chest symmetric Cardio vascular:  S1/S2, RRR, No murmure, No Rubs or Gallops  pulmonary: Clear to auscultation  bilaterally, respirations unlabored, negative wheezes / crackles Abdomen: Soft, non-tender, non-distended, bowel sounds,no masses, no organomegaly Muscular skeletal: Limited exam - in bed, able to move all 4 extremities, Normal strength,  Neuro: CNII-XII intact. , normal motor and sensation, reflexes intact  Extremities: No pitting edema lower extremities, +2 pulses  Skin: Dry, warm to touch, negative for any Rashes, No open wounds Wounds: per nursing documentation    ------------------------------------------------------------------------------------------------------------------------------------------    LABs:  CBC Latest Ref Rng & Units 11/25/2019 11/24/2019 11/22/2019  WBC 4.0 - 10.5 K/uL 15.2(H) 16.8(H) 22.5(H)  Hemoglobin 12.0 - 15.0 g/dL 10.0(L) 10.6(L) 11.4(L)  Hematocrit 36 - 46 % 29.3(L) 32.0(L) 35.7(L)  Platelets 150 - 400 K/uL 572(H) 584(H) 701(H)   CMP Latest Ref Rng & Units 11/25/2019 11/24/2019 11/23/2019  Glucose 70 - 99 mg/dL 660(Y) 301(S) 010(X)  BUN 8 - 23 mg/dL 32(T) 55(D) 32(K)  Creatinine 0.44 - 1.00 mg/dL 0.25(K) 2.70(W) 2.37(S)  Sodium 135 - 145 mmol/L 137 138 138  Potassium 3.5 - 5.1 mmol/L 3.5 3.8 3.8  Chloride 98 - 111 mmol/L 106 106 103  CO2  22 - 32 mmol/L 21(L) 24 22  Calcium 8.9 - 10.3 mg/dL 7.9(L) 8.4(L) 8.4(L)  Total Protein 6.5 - 8.1 g/dL - - -  Total Bilirubin 0.3 - 1.2 mg/dL - - -  Alkaline Phos 38 - 126 U/L - - -  AST 15 - 41 U/L - - -  ALT 0 - 44 U/L - - -       Micro Results Recent Results (from the past 240 hour(s))  SARS Coronavirus 2 by RT PCR (hospital order, performed in [facility] hospital lab) Nasopharyngeal Nasopharyngeal Swab     Status: None   Collection Time: 11/23/19 12:00 AM   Specimen: Nasopharyngeal Swab  Result Value Ref Range Status   SARS Coronavirus 2 NEGATIVE NEGATIVE Final    Comment: (NOTE) SARS-CoV-2 target nucleic acids are NOT DETECTED.  The SARS-CoV-2 RNA is generally detectable in upper and lower respiratory specimens during the acute phase of infection. The lowest concentration of SARS-CoV-2 viral copies this assay can detect is 250 copies / mL. A negative result does not preclude SARS-CoV-2 infection and should not be used as the sole basis for treatment or other patient management decisions.  A negative result may occur with improper specimen collection / handling, submission of specimen other than nasopharyngeal swab, presence of viral mutation(s) within the areas targeted by this assay, and inadequate number of viral copies (<250 copies / mL). A negative result must be combined with clinical observations, patient history, and epidemiological information.  Fact Sheet for Patients:   BoilerBrush.com.cy  Fact Sheet for Healthcare Providers: https://pope.com/  This test is not yet approved or  cleared by the [place] FDA and has been authorized for detection and/or diagnosis of SARS-CoV-2 by FDA under an Emergency Use Authorization (EUA).  This EUA will remain in effect (meaning this test can be used) for the duration of the COVID-19 declaration under Section 564(b)(1) of the Act, 21 U.S.C. section 360bbb-3(b)(1),  unless the authorization is terminated or revoked sooner.  Performed at [facility], [address] [place], [place] [address]   Blood culture (routine x 2)     Status: None (Preliminary result)   Collection Time: 11/23/19  6:33 AM   Specimen: BLOOD  Result Value Ref Range Status   Specimen Description BLOOD LEFT ANTECUBITAL  Final   Special Requests   Final    BOTTLES DRAWN AEROBIC AND  ANAEROBIC Blood Culture adequate volume   Culture   Final    NO GROWTH 2 DAYS Performed at [facility], [address] [place], [place] [address]    Report Status PENDING  Incomplete  Blood culture (routine x 2)     Status: None (Preliminary result)   Collection Time: 11/23/19  6:55 AM   Specimen: BLOOD  Result Value Ref Range Status   Specimen Description BLOOD RIGHT ANTECUBITAL  Final   Special Requests   Final    BOTTLES DRAWN AEROBIC AND ANAEROBIC Blood Culture adequate volume   Culture   Final    NO GROWTH 2 DAYS Performed at [facility], [address] [place], [place] [address]    Report Status PENDING  Incomplete  Urine culture     Status: Abnormal   Collection Time: 11/23/19 12:24 PM   Specimen: Urine, Random  Result Value Ref Range Status   Specimen Description   Final    URINE, RANDOM Performed at [facility], [address] [place], [place] [address]    Special Requests   Final    NONE Performed at [facility], [address] [place], [place] [address]    Culture (A)  Final    <10,000 COLONIES/mL INSIGNIFICANT GROWTH Performed at [facility], [address] [place], [place] [address]    Report Status 11/25/2019 FINAL  Final  C Difficile Quick Screen w PCR reflex     Status: None   Collection Time: 11/24/19  6:26 AM   Specimen: STOOL  Result Value Ref Range Status   C Diff antigen NEGATIVE NEGATIVE Final   C Diff toxin NEGATIVE NEGATIVE Final   C Diff interpretation No C. difficile detected.  Final     Comment: Performed at [facility], [address] [place], [place] [address]    Radiology Reports CT ABDOMEN PELVIS WO CONTRAST  Result Date: 11/23/2019 CLINICAL DATA:  Acute nonlocalized abdominal pain, generalized weakness, and decreased appetite for 2 days EXAM: CT ABDOMEN AND PELVIS WITHOUT CONTRAST TECHNIQUE: Multidetector CT imaging of the abdomen and pelvis was performed following the standard protocol without IV contrast. Sagittal and coronal MPR images reconstructed from axial data set. No oral contrast administered. COMPARISON:  None FINDINGS: Lower chest: Lung bases emphysematous but clear Hepatobiliary: Gallbladder surgically absent.  Liver unremarkable. Pancreas: Normal appearance Spleen: Small but otherwise normal appearance Adrenals/Urinary Tract: BILATERAL adrenal gland thickening without discrete mass. Tiny nonobstructing LEFT renal calculus. Cortical scarring at anterior inferior LEFT kidney. No renal mass, hydronephrosis, or hydroureter. Thickened anterior superior bladder wall with adjacent infiltrative change, could represent cystitis though tumor not excluded with this appearance. Stomach/Bowel: Normal appendix. Large duodenal diverticulum. Moderate-sized hiatal hernia with wall thickening in the hernia sac, question artifact from underdistention, cannot exclude mass. Associated thickening of the distal esophageal wall. Remainder stomach decompressed. Diverticulosis of transverse through sigmoid colon with single diverticula noted at ascending colon. No colonic wall thickening or pericolic infiltrative changes to suggest acute diverticulitis. Remaining bowel loops unremarkable. Vascular/Lymphatic: Extensive atherosclerotic calcifications aorta, visceral arteries, iliac arteries. 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 Multiple pelvic phleboliths. No adenopathy. Reproductive: Unremarkable uterus, ovaries, and adnexa Other: No free air or free  fluid. Small cyst identified within LEFT perineum at LEFT labia, question Bartholin cyst 1.8 x 1.3 cm. Musculoskeletal: Osseous demineralization with degenerative disc and facet disease changes of lower lumbar spine associated with grade 1 anterolisthesis L4-L5. IMPRESSION: Extensive colonic diverticulosis without  evidence of diverticulitis. Moderate-sized hiatal hernia with wall thickening in the hernia sac, question artifact from underdistention, as well as thickening of distal esophageal wall; consider endoscopy to exclude gastric neoplasm if clinically indicated based on patient age and comorbidities. Tiny nonobstructing LEFT renal calculus. Thickened anterior superior bladder wall with adjacent infiltrative change, could represent cystitis though tumor not excluded with this appearance; recommend correlation with urinalysis. Small cyst within LEFT perineum at LEFT labia, question Bartholin cyst 1.8 x 1.3 cm. Aortic Atherosclerosis (ICD10-I70.0) and Emphysema (ICD10-J43.9). Electronically Signed   By: Ulyses Southward M.D.   On: 11/23/2019 08:13   DG Chest 2 View  Result Date: 11/23/2019 CLINICAL DATA:  Weakness EXAM: CHEST - 2 VIEW COMPARISON:  None. FINDINGS: The heart size and mediastinal contours are within normal limits. Both lungs are clear. The visualized skeletal structures are unremarkable. IMPRESSION: No active cardiopulmonary disease. Electronically Signed   By: Deatra Robinson M.D.   On: 11/23/2019 00:07   US RENAL  Result Date: 11/23/2019 CLINICAL DATA:  Acute kidney injury. EXAM: RENAL / URINARY TRACT ULTRASOUND COMPLETE COMPARISON:  CT same day FINDINGS: Right Kidney: Renal measurements: 12.2 x 5.1 x 5.3 cm = volume: 183 mL. Echogenicity within normal limits. No mass or hydronephrosis visualized. Left Kidney: Renal measurements: 10.9 x 5.3 x 4.5 cm = volume: 135 mL. Echogenicity within normal limits. No mass or hydronephrosis visualized. Mild volume loss lower pole. Bladder: Appears normal for  degree of bladder distention. Other: None. IMPRESSION: Study within normal limits on the right. Normal size and echogenicity. No hydronephrosis. On the left, there is some scarring/volume loss at the lower pole but the kidney appears otherwise normal sonographically. Tiny nonobstructing stones seen by CT not identified by ultrasound. Electronically Signed   By: Paulina Fusi M.D.   On: 11/23/2019 15:06    SIGNED: Kendell Bane, MD, FACP, FHM. [facility],  Pager (please use [email or web address] to [email or web address])  If 7PM-7AM, please contact night-coverage [email or web address] 11/25/2019, 10:35 AM

## 2019-11-26 ENCOUNTER — Inpatient Hospital Stay: Payer: PPO

## 2019-11-26 LAB — LACTIC ACID, PLASMA
Lactic Acid, Venous: 2.8 mmol/L (ref 0.5–1.9)
Lactic Acid, Venous: 2.5 mmol/L (ref 0.5–1.9)

## 2019-11-26 LAB — CBC
HCT: 31.6 % — ABNORMAL LOW (ref 36.0–46.0)
Hemoglobin: 10.6 g/dL — ABNORMAL LOW (ref 12.0–15.0)
MCH: 29 pg (ref 26.0–34.0)
MCHC: 33.5 g/dL (ref 30.0–36.0)
MCV: 86.3 fL (ref 80.0–100.0)
Platelets: 584 10*3/uL — ABNORMAL HIGH (ref 150–400)
RBC: 3.66 MIL/uL — ABNORMAL LOW (ref 3.87–5.11)
RDW: 12.8 % (ref 11.5–15.5)
WBC: 17.2 10*3/uL — ABNORMAL HIGH (ref 4.0–10.5)
nRBC: 0 % (ref 0.0–0.2)

## 2019-11-26 LAB — URINALYSIS, ROUTINE W REFLEX MICROSCOPIC
RBC / HPF: >50 RBC/hpf — ABNORMAL HIGH (ref 0–5)
Specific Gravity, Urine: 1.011 (ref 1.005–1.030)
WBC, UA: >50 WBC/hpf — ABNORMAL HIGH (ref 0–5)

## 2019-11-26 LAB — BASIC METABOLIC PANEL
Anion gap: 10 (ref 5–15)
BUN: 43 mg/dL — ABNORMAL HIGH (ref 8–23)
CO2: 23 mmol/L (ref 22–32)
Calcium: 8.2 mg/dL — ABNORMAL LOW (ref 8.9–10.3)
Chloride: 105 mmol/L (ref 98–111)
Creatinine, Ser: 2.21 mg/dL — ABNORMAL HIGH (ref 0.44–1.00)
GFR calc Af Amer: 23 mL/min — ABNORMAL LOW (ref >60)
GFR calc non Af Amer: 20 mL/min — ABNORMAL LOW (ref >60)
Glucose, Bld: 184 mg/dL — ABNORMAL HIGH (ref 70–99)
Potassium: 4.1 mmol/L (ref 3.5–5.1)
Sodium: 138 mmol/L (ref 135–145)

## 2019-11-26 LAB — GLUCOSE, CAPILLARY
Glucose-Capillary: 190 mg/dL — ABNORMAL HIGH (ref 70–99)
Glucose-Capillary: 288 mg/dL — ABNORMAL HIGH (ref 70–99)
Glucose-Capillary: 228 mg/dL — ABNORMAL HIGH (ref 70–99)
Glucose-Capillary: 221 mg/dL — ABNORMAL HIGH (ref 70–99)

## 2019-11-26 LAB — PROCALCITONIN: Procalcitonin: <0.1 ng/mL

## 2019-11-26 MED ORDER — PIPERACILLIN-TAZOBACTAM 3.375 G IVPB 30 MIN
3.3750 g | Freq: Three times a day (TID) | INTRAVENOUS | Status: DC
  Administered 2019-11-26: 3.375 g via INTRAVENOUS
  Filled 2019-11-26 (×3): qty 50

## 2019-11-26 MED ORDER — RISAQUAD PO CAPS
2.0000 | ORAL_CAPSULE | Freq: Three times a day (TID) | ORAL | Status: DC
  Administered 2019-11-26 – 2019-11-30 (×13): 2 via ORAL
  Filled 2019-11-26 (×15): qty 2

## 2019-11-26 MED ORDER — PIPERACILLIN-TAZOBACTAM IN DEX 2-0.25 GM/50ML IV SOLN
2.2500 g | Freq: Four times a day (QID) | INTRAVENOUS | Status: DC
  Administered 2019-11-26 – 2019-11-28 (×5): 2.25 g via INTRAVENOUS
  Filled 2019-11-26 (×9): qty 50

## 2019-11-26 MED ORDER — LACTATED RINGERS IV BOLUS
1000.0000 mL | Freq: Once | INTRAVENOUS | Status: AC
  Administered 2019-11-26: 1000 mL via INTRAVENOUS

## 2019-11-26 MED ORDER — SODIUM CHLORIDE 0.9 % IV SOLN: INTRAVENOUS | Status: DC

## 2019-11-26 MED ORDER — BACID PO TABS
2.0000 | ORAL_TABLET | Freq: Three times a day (TID) | ORAL | Status: DC
  Filled 2019-11-26 (×2): qty 2

## 2019-11-26 NOTE — Plan of Care (Signed)
Informed Dr. Rennis Petty critical lab: lactic 2.8

## 2019-11-26 NOTE — Progress Notes (Signed)
Inpatient Diabetes Program Recommendations  AACE/ADA: New Consensus Statement on Inpatient Glycemic Control  Target Ranges:  Prepandial:   less than 140 mg/dL      Peak postprandial:   less than 180 mg/dL (1-2 hours)      Critically ill patients:  140 - 180 mg/dL  Results for Amanda Benson, Amanda Benson (MRN [id number]) as of 11/26/2019 10:27  Ref. Range 11/24/2019 01:47 11/24/2019 16:37 11/24/2019 22:31 11/25/2019 12:58 11/25/2019 21:45 11/26/2019 08:08  Glucose-Capillary Latest Ref Range: 70 - 99 mg/dL 053 (H) 976 (H) 734 (H) 289 (H) 162 (H) 190 (H)    Review of Glycemic Control  Diabetes history: DM2 Outpatient Diabetes medications: Glipizide 10 mg daily, Januvia 50 mg daily, Metformin 1000 mg BID Current orders for Inpatient glycemic control: Levemir 10 units daily, Novolog 0-15 units TID with meals, Novolog 0-5 units QHS  Inpatient Diabetes Program Recommendations:    Insulin-Please consider ordering Novolog 3 units TID with meals for meal coverage if patient eats at least 50% of meals.  Thanks, Orlando Penner, RN, MSN, CDE Diabetes Coordinator Inpatient Diabetes Program [phone] (Team Pager from 8am to 5pm)

## 2019-11-26 NOTE — Progress Notes (Signed)
PROGRESS NOTE    Patient: Amanda Benson                            PCP: Judy Pimple, MD                    DOB: 1935/07/15            DOA: 11/23/2019 EAV:[id number]             DOS: 11/26/2019, 10:36 AM   LOS: 3 days   Date of Service: The patient was seen and examined on 11/26/2019  Subjective:   The patient was seen and examined this morning, stable no acute distress mildly lethargic. She denies any fever chills.  Reported the patient noted her creatinine and WBC has elevated this a.m.  Brief Narrative:   KELSEE PRESLAR is a 84 y.o. female with medical history significant for diabetes mellitus, hypertension, dyslipidemia who presents to the ER for evaluation of generalized weakness and fatigue.  She has had a 2-week history of nausea, vomiting and diarrhea.  Labs reveal evidence of an acute kidney injury, serum creatinine at baseline is 1.1 and today on admission it is 3.4.  Lactic acid is also elevated and she also has leukocytosis as well as an anion gap metabolic acidosis, in DKA.   Her COVID-19 PCR test is negative.   CT scan of abdomen and pelvis shows findings suggestive of cystitis with mild pyuria. she will be admitted to the hospital for further evaluation   Assessment & Plan:   Principal Problem:   DKA (diabetic ketoacidoses) (HCC) Active Problems:   Essential hypertension   Depression with anxiety   AKI (acute kidney injury) (HCC)   Leukocytosis    Diabetic ketoacidosis -Much improved, p.o. intake not adequate yet -Last CBGs 192, 282, 168 -Tolerating p.o. -Resuming IV fluids due to poor p.o. intake -Initiating basal insulin, Levemir, Initiating checking blood sugar QA CHS, with SSI coverage  Improved metabolic acidosis, Improved lactic acidosis -In the setting of uncontrolled diabetes mellitus, hyperglycemia  Blood sugar has improved with IV fluid resuscitation, insulin drip was not pursued per DKA protocol Continue with IV fluid resuscitation  normal saline -Anion gap is closed -initiating Levemir, SSI, -Initiating diabetic diet  -But holding home medication of Metformin   Acute kidney injury -Likely due to dehydration, volume depletion, GI loss nausea vomiting poor p.o. intake, DKA -We will holding home diuretics  Baseline serum creatinine is 1.1 and on admission it is 3.4 >> 2.80, >> 2.05  >> 2.21 today  CT scan of abdomen and pelvis which showed thickened anterior superior bladder wall with adjacent infiltrative change, could represent cystitis though tumor not excluded with this appearance; recommend correlation with urinalysis. -UA revealed small amount leukocyte Estrace, negative for nitrates no bacteria greater than 50 WBCs Cultures inconclusive due to organism-repeating UA with cultures  Renal ultrasound for further evaluation IMPRESSION: No hydronephrosis. On the left, there is some scarring/volume loss at the lower pole but the kidney appears otherwise normal sonographically.  Tiny nonobstructing stones seen by CT not identified by ultrasound.  Leukocytosis -possible early UTI Remains afebrile normotensive, improved leukocytosis 16.8-->15.2 >> 17.2  Remained afebrile, normotensive Unclear etiology -blood urine culture -inconclusive, repeating  stool for C. difficile negative Initiate empiric antibiotics of Rocephin Patient has pyuria but it is mild and does not explain the marked leukocytosis   Depression -Remained stable, -Continue current medication of bupropion and  fluoxetine   Hypertension -Monitoring closely, mildly elevated -We will continue metoprolol and amlodipine     Consultants: None   ---------------------------------------------------------------------------------------------------------------------------------------------  DVT prophylaxis:  SCD/Compression stockings and Heparin SQ Code Status:   Code Status: DNR Family Communication: No family member present at  bedside- attempt will be made to update daily The above findings and plan of care has been discussed with patient (and family )  in detail,  they expressed understanding and agreement of above. -Advance care planning has been discussed.   Admission status:    Status is: Inpatient  Remains inpatient appropriate because:Inpatient level of care appropriate due to severity of illness   Dispo: The patient is from: Home              Anticipated d/c is to: Home              Anticipated d/c date is: Likely in a.m. if tolerating p.o., if blood sugar improved, leukocytosis improved.              Patient currently is not medically stable to d/c.        Procedures:   No admission procedures for hospital encounter.     Antimicrobials:  Anti-infectives (From admission, onward)   Start     Dose/Rate Route Frequency Ordered Stop   11/24/19 1230  cefTRIAXone (ROCEPHIN) 1 g in sodium chloride 0.9 % 100 mL IVPB        1 g 200 mL/hr over 30 Minutes Intravenous Every 24 hours 11/24/19 1200     11/23/19 1015  cefTRIAXone (ROCEPHIN) 1 g in sodium chloride 0.9 % 100 mL IVPB        1 g 200 mL/hr over 30 Minutes Intravenous  Once 11/23/19 1003 11/23/19 1259       Medication:  . acidophilus  2 capsule Oral TID  . amLODipine  10 mg Oral Daily  . aspirin EC  81 mg Oral Daily  . buPROPion  300 mg Oral Daily  . FLUoxetine  40 mg Oral Daily  . heparin  5,000 Units Subcutaneous Q8H  . insulin aspart  0-15 Units Subcutaneous TID WC  . insulin aspart  0-5 Units Subcutaneous QHS  . insulin detemir  10 Units Subcutaneous Daily  . metoprolol succinate  25 mg Oral Daily  . pantoprazole  40 mg Oral Daily  . raloxifene  60 mg Oral Daily  . simvastatin  20 mg Oral q1800  . sucralfate  1 g Oral TID WC & HS    acetaminophen **OR** acetaminophen, dextrose, ondansetron **OR** ondansetron (ZOFRAN) IV   Objective:   Vitals:   11/25/19 2305 11/26/19 0310 11/26/19 0806 11/26/19 1007  BP: (!) 118/43  120/61 (!) 153/59 (!) 155/60  Pulse: 65 71 72 75  Resp: 18 17 17    Temp: 98.8 F (37.1 C) 98.7 F (37.1 C) 98.2 F (36.8 C)   TempSrc: Oral Oral Oral   SpO2: 100% 97% 98%   Weight:      Height:        Intake/Output Summary (Last 24 hours) at 11/26/2019 1036 Last data filed at 11/26/2019 0949 Gross per 24 hour  Intake 220 ml  Output 1000 ml  Net -780 ml   Filed Weights   11/22/19 2139 11/25/19 1544  Weight: 62.6 kg 62.6 kg     Examination:      Physical Exam:   General:  Alert, oriented, cooperative, no distress;   HEENT:  Normocephalic, PERRL, otherwise with in Normal limits  Neuro:  CNII-XII intact. , normal motor and sensation, reflexes intact   Lungs:   Clear to auscultation BL, Respirations unlabored, no wheezes / crackles  Cardio:    S1/S2, RRR, No murmure, No Rubs or Gallops   Abdomen:   Soft, non-tender, bowel sounds active all four quadrants,  no guarding or peritoneal signs.  Muscular skeletal:   Generalized weaknesses, Limited exam - in bed, able to move all 4 extremities, Normal strength,  2+ pulses,  symmetric, No pitting edema  Skin:  Dry, warm to touch, negative for any Rashes, No open wounds  Wounds: Please see nursing documentation         ------------------------------------------------------------------------------------------------------------------------------------------    LABs:  CBC Latest Ref Rng & Units 11/26/2019 11/25/2019 11/24/2019  WBC 4.0 - 10.5 K/uL 17.2(H) 15.2(H) 16.8(H)  Hemoglobin 12.0 - 15.0 g/dL 10.6(L) 10.0(L) 10.6(L)  Hematocrit 36 - 46 % 31.6(L) 29.3(L) 32.0(L)  Platelets 150 - 400 K/uL 584(H) 572(H) 584(H)   CMP Latest Ref Rng & Units 11/26/2019 11/25/2019 11/24/2019  Glucose 70 - 99 mg/dL 474(Q) 595(G) 387(F)  BUN 8 - 23 mg/dL 64(P) 32(R) 51(O)  Creatinine 0.44 - 1.00 mg/dL 8.41(Y) 6.06(T) 0.16(W)  Sodium 135 - 145 mmol/L 138 137 138  Potassium 3.5 - 5.1 mmol/L 4.1 3.5 3.8  Chloride 98 - 111 mmol/L 105 106 106  CO2  22 - 32 mmol/L 23 21(L) 24  Calcium 8.9 - 10.3 mg/dL 8.2(L) 7.9(L) 8.4(L)  Total Protein 6.5 - 8.1 g/dL - - -  Total Bilirubin 0.3 - 1.2 mg/dL - - -  Alkaline Phos 38 - 126 U/L - - -  AST 15 - 41 U/L - - -  ALT 0 - 44 U/L - - -       Micro Results Recent Results (from the past 240 hour(s))  SARS Coronavirus 2 by RT PCR (hospital order, performed in [facility] hospital lab) Nasopharyngeal Nasopharyngeal Swab     Status: None   Collection Time: 11/23/19 12:00 AM   Specimen: Nasopharyngeal Swab  Result Value Ref Range Status   SARS Coronavirus 2 NEGATIVE NEGATIVE Final    Comment: (NOTE) SARS-CoV-2 target nucleic acids are NOT DETECTED.  The SARS-CoV-2 RNA is generally detectable in upper and lower respiratory specimens during the acute phase of infection. The lowest concentration of SARS-CoV-2 viral copies this assay can detect is 250 copies / mL. A negative result does not preclude SARS-CoV-2 infection and should not be used as the sole basis for treatment or other patient management decisions.  A negative result may occur with improper specimen collection / handling, submission of specimen other than nasopharyngeal swab, presence of viral mutation(s) within the areas targeted by this assay, and inadequate number of viral copies (<250 copies / mL). A negative result must be combined with clinical observations, patient history, and epidemiological information.  Fact Sheet for Patients:   BoilerBrush.com.cy  Fact Sheet for Healthcare Providers: https://pope.com/  This test is not yet approved or  cleared by the [place] FDA and has been authorized for detection and/or diagnosis of SARS-CoV-2 by FDA under an Emergency Use Authorization (EUA).  This EUA will remain in effect (meaning this test can be used) for the duration of the COVID-19 declaration under Section 564(b)(1) of the Act, 21 U.S.C. section 360bbb-3(b)(1),  unless the authorization is terminated or revoked sooner.  Performed at [facility], [address] [place], [place] [address]   Blood culture (routine x 2)     Status: None (  Preliminary result)   Collection Time: 11/23/19  6:33 AM   Specimen: BLOOD  Result Value Ref Range Status   Specimen Description BLOOD LEFT ANTECUBITAL  Final   Special Requests   Final    BOTTLES DRAWN AEROBIC AND ANAEROBIC Blood Culture adequate volume   Culture   Final    NO GROWTH 3 DAYS Performed at [facility], [address] [place], [place] [address]    Report Status PENDING  Incomplete  Blood culture (routine x 2)     Status: None (Preliminary result)   Collection Time: 11/23/19  6:55 AM   Specimen: BLOOD  Result Value Ref Range Status   Specimen Description BLOOD RIGHT ANTECUBITAL  Final   Special Requests   Final    BOTTLES DRAWN AEROBIC AND ANAEROBIC Blood Culture adequate volume   Culture   Final    NO GROWTH 3 DAYS Performed at [facility], [address] [place], [place] [address]    Report Status PENDING  Incomplete  Urine culture     Status: Abnormal   Collection Time: 11/23/19 12:24 PM   Specimen: Urine, Random  Result Value Ref Range Status   Specimen Description   Final    URINE, RANDOM Performed at [facility], [address], [place], [place] [address]    Special Requests   Final    NONE Performed at [facility], [address] [place], [place] [address]    Culture (A)  Final    <10,000 COLONIES/mL INSIGNIFICANT GROWTH Performed at [facility], [address] [place], [place] [address]    Report Status 11/25/2019 FINAL  Final  C Difficile Quick Screen w PCR reflex     Status: None   Collection Time: 11/24/19  6:26 AM   Specimen: STOOL  Result Value Ref Range Status   C Diff antigen NEGATIVE NEGATIVE Final   C Diff toxin NEGATIVE NEGATIVE Final   C Diff interpretation No C. difficile detected.  Final     Comment: Performed at [facility], [address] [place], [place] [address]    Radiology Reports CT ABDOMEN PELVIS WO CONTRAST  Result Date: 11/23/2019 CLINICAL DATA:  Acute nonlocalized abdominal pain, generalized weakness, and decreased appetite for 2 days EXAM: CT ABDOMEN AND PELVIS WITHOUT CONTRAST TECHNIQUE: Multidetector CT imaging of the abdomen and pelvis was performed following the standard protocol without IV contrast. Sagittal and coronal MPR images reconstructed from axial data set. No oral contrast administered. COMPARISON:  None FINDINGS: Lower chest: Lung bases emphysematous but clear Hepatobiliary: Gallbladder surgically absent.  Liver unremarkable. Pancreas: Normal appearance Spleen: Small but otherwise normal appearance Adrenals/Urinary Tract: BILATERAL adrenal gland thickening without discrete mass. Tiny nonobstructing LEFT renal calculus. Cortical scarring at anterior inferior LEFT kidney. No renal mass, hydronephrosis, or hydroureter. Thickened anterior superior bladder wall with adjacent infiltrative change, could represent cystitis though tumor not excluded with this appearance. Stomach/Bowel: Normal appendix. Large duodenal diverticulum. Moderate-sized hiatal hernia with wall thickening in the hernia sac, question artifact from underdistention, cannot exclude mass. Associated thickening of the distal esophageal wall. Remainder stomach decompressed. Diverticulosis of transverse through sigmoid colon with single diverticula noted at ascending colon. No colonic wall thickening or pericolic infiltrative changes to suggest acute diverticulitis. Remaining bowel loops unremarkable. Vascular/Lymphatic: Extensive atherosclerotic calcifications aorta, visceral arteries, iliac arteries. 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 Multiple pelvic phleboliths. No adenopathy. Reproductive: Unremarkable uterus, ovaries, and adnexa Other: No free air or free  fluid. Small cyst identified within LEFT perineum at LEFT labia, question Bartholin cyst 1.8 x 1.3 cm. Musculoskeletal: Osseous demineralization with degenerative disc and facet disease changes of lower lumbar spine associated with grade 1 anterolisthesis L4-L5. IMPRESSION: Extensive colonic diverticulosis without evidence of diverticulitis. Moderate-sized hiatal hernia with wall thickening in the hernia sac, question artifact from underdistention, as well as thickening of distal esophageal wall; consider endoscopy to exclude gastric neoplasm if clinically indicated based on patient age and comorbidities. Tiny nonobstructing LEFT renal calculus. Thickened anterior superior bladder wall with adjacent infiltrative change, could represent cystitis though tumor not excluded with this appearance; recommend correlation with urinalysis. Small cyst within LEFT perineum at LEFT labia, question Bartholin cyst 1.8 x 1.3 cm. Aortic Atherosclerosis (ICD10-I70.0) and Emphysema (ICD10-J43.9). Electronically Signed   By: Ulyses Southward M.D.   On: 11/23/2019 08:13   DG Chest 2 View  Result Date: 11/26/2019 CLINICAL DATA:  Weakness, fatigue. EXAM: CHEST - 2 VIEW COMPARISON:  November 22, 2019. FINDINGS: The heart size and mediastinal contours are within normal limits. Both lungs are clear. The visualized skeletal structures are unremarkable. IMPRESSION: No active cardiopulmonary disease. Aortic Atherosclerosis (ICD10-I70.0). Electronically Signed   By: Lupita Raider M.D.   On: 11/26/2019 09:47   DG Chest 2 View  Result Date: 11/23/2019 CLINICAL DATA:  Weakness EXAM: CHEST - 2 VIEW COMPARISON:  None. FINDINGS: The heart size and mediastinal contours are within normal limits. Both lungs are clear. The visualized skeletal structures are unremarkable. IMPRESSION: No active cardiopulmonary disease. Electronically Signed   By: Deatra Robinson M.D.   On: 11/23/2019 00:07   US RENAL  Result Date: 11/23/2019 CLINICAL DATA:  Acute  kidney injury. EXAM: RENAL / URINARY TRACT ULTRASOUND COMPLETE COMPARISON:  CT same day FINDINGS: Right Kidney: Renal measurements: 12.2 x 5.1 x 5.3 cm = volume: 183 mL. Echogenicity within normal limits. No mass or hydronephrosis visualized. Left Kidney: Renal measurements: 10.9 x 5.3 x 4.5 cm = volume: 135 mL. Echogenicity within normal limits. No mass or hydronephrosis visualized. Mild volume loss lower pole. Bladder: Appears normal for degree of bladder distention. Other: None. IMPRESSION: Study within normal limits on the right. Normal size and echogenicity. No hydronephrosis. On the left, there is some scarring/volume loss at the lower pole but the kidney appears otherwise normal sonographically. Tiny nonobstructing stones seen by CT not identified by ultrasound. Electronically Signed   By: Paulina Fusi M.D.   On: 11/23/2019 15:06    SIGNED: Kendell Bane, MD, FACP, FHM. [facility],  Pager (please use [email or web address] to page/[email or web address])  If 7PM-7AM, please contact night-coverage [email or web address] 11/26/2019, 10:36 AM

## 2019-11-27 LAB — BASIC METABOLIC PANEL
Anion gap: 9 (ref 5–15)
BUN: 41 mg/dL — ABNORMAL HIGH (ref 8–23)
CO2: 21 mmol/L — ABNORMAL LOW (ref 22–32)
Calcium: 7.2 mg/dL — ABNORMAL LOW (ref 8.9–10.3)
Chloride: 112 mmol/L — ABNORMAL HIGH (ref 98–111)
Creatinine, Ser: 2.07 mg/dL — ABNORMAL HIGH (ref 0.44–1.00)
GFR calc Af Amer: 25 mL/min — ABNORMAL LOW (ref >60)
GFR calc non Af Amer: 22 mL/min — ABNORMAL LOW (ref >60)
Glucose, Bld: 186 mg/dL — ABNORMAL HIGH (ref 70–99)
Potassium: 3.9 mmol/L (ref 3.5–5.1)
Sodium: 142 mmol/L (ref 135–145)

## 2019-11-27 LAB — GLUCOSE, CAPILLARY
Glucose-Capillary: 180 mg/dL — ABNORMAL HIGH (ref 70–99)
Glucose-Capillary: 240 mg/dL — ABNORMAL HIGH (ref 70–99)
Glucose-Capillary: 308 mg/dL — ABNORMAL HIGH (ref 70–99)
Glucose-Capillary: 200 mg/dL — ABNORMAL HIGH (ref 70–99)

## 2019-11-27 LAB — CBC
HCT: 26.4 % — ABNORMAL LOW (ref 36.0–46.0)
Hemoglobin: 8.5 g/dL — ABNORMAL LOW (ref 12.0–15.0)
MCH: 28.8 pg (ref 26.0–34.0)
MCHC: 32.2 g/dL (ref 30.0–36.0)
MCV: 89.5 fL (ref 80.0–100.0)
Platelets: 498 10*3/uL — ABNORMAL HIGH (ref 150–400)
RBC: 2.95 MIL/uL — ABNORMAL LOW (ref 3.87–5.11)
RDW: 13.1 % (ref 11.5–15.5)
WBC: 14.1 10*3/uL — ABNORMAL HIGH (ref 4.0–10.5)
nRBC: 0.1 % (ref 0.0–0.2)

## 2019-11-27 LAB — LACTIC ACID, PLASMA: Lactic Acid, Venous: 2.4 mmol/L (ref 0.5–1.9)

## 2019-11-27 MED ORDER — LACTATED RINGERS IV SOLN: INTRAVENOUS | Status: DC

## 2019-11-27 MED ORDER — RISAQUAD PO CAPS
2.0000 | ORAL_CAPSULE | Freq: Three times a day (TID) | ORAL | Status: DC
  Filled 2019-11-27 (×3): qty 2

## 2019-11-27 NOTE — Progress Notes (Signed)
PROGRESS NOTE    Patient: Amanda Benson                            PCP: Judy Pimple, MD                    DOB: [birth year]            DOA: 11/23/2019 YJZ:[id number]             DOS: 11/27/2019, 1:29 PM   LOS: 4 days   Date of Service: The patient was seen and examined on 11/27/2019  Subjective:   The patient was seen and examined this morning, stable, no acute distress Temperature 9.5 this a.m., hemodynamically stable No issues overnight  Daughter present at bedside    Brief Narrative:   MABLE DARA is a 84 y.o. female with medical history significant for diabetes mellitus, hypertension, dyslipidemia who presents to the ER for evaluation of generalized weakness and fatigue.  She has had a 2-week history of nausea, vomiting and diarrhea.  Labs reveal evidence of an acute kidney injury, serum creatinine at baseline is 1.1 and today on admission it is 3.4.  Lactic acid is also elevated and she also has leukocytosis as well as an anion gap metabolic acidosis, in DKA.   Her COVID-19 PCR test is negative.   CT scan of abdomen and pelvis shows findings suggestive of cystitis with mild pyuria. she will be admitted to the hospital for further evaluation   Assessment & Plan:   Principal Problem:   DKA (diabetic ketoacidoses) (HCC) Active Problems:   Essential hypertension   Depression with anxiety   AKI (acute kidney injury) (HCC)   Leukocytosis   Sepsis -likely source UTI -Patient met the septic criteria on 11/26/2019 With T-max of 100.7, tachycardia, tachypnea, soft blood pressures but was satting 90% on room air Leukocytosis 17.2, L.A 2.4  Remains afebrile normotensive, improved leukocytosis 16.8-->15.2 >> 17.2  >> 14.1 today  Unclear etiology -blood urine culture -inconclusive, repeating  stool for C. difficile negative -was on IV Abx IV Rocephin >> switch to Zosyn 11/27/19 >>>   Diabetic ketoacidosis -Resolved -Monitoring CBG, 180, 240 ..  -Tolerating  p.o. -Resuming IV fluids due to poor p.o. intake -Initiating basal insulin, Levemir, Initiating checking blood sugar QA CHS, with SSI coverage  -Continue to have metabolic acidosis likely due to infection  Blood sugar has improved with IV fluid resuscitation, insulin drip was not pursued per DKA protocol Continue with IV fluid resuscitation normal saline -Anion gap is closed -initiating Levemir, SSI, -Initiating diabetic diet  -But holding home medication of Metformin   Acute kidney injury -Monitoring very closely -Likely due to dehydration, volume depletion, GI loss nausea vomiting poor p.o. intake, DKA -We will holding home diuretics  Baseline serum creatinine is 1.1 and on admission it is 3.4 >> 2.80, >> 2.05  >> 2.21 >>2.07 today  CT scan of abdomen and pelvis which showed thickened anterior superior bladder wall with adjacent infiltrative change, could represent cystitis though tumor not excluded with this appearance; recommend correlation with urinalysis. -UA revealed small amount leukocyte Estrace, negative for nitrates no bacteria greater than 50 WBCs Cultures inconclusive due to organism-repeating UA with cultures  Renal ultrasound for further evaluation IMPRESSION: No hydronephrosis. On the left, there is some scarring/volume loss at the lower pole but the kidney appears otherwise normal sonographically.  Tiny nonobstructing stones seen by CT not identified by  ultrasound.   Depression -Stable  -Continue current medication of bupropion and fluoxetine   Hypertension -Monitoring closely, mildly elevated -We will continue metoprolol and amlodipine   Consultants: None   ---------------------------------------------------------------------------------------------------------------------------------------------  DVT prophylaxis:  SCD/Compression stockings and Heparin SQ Code Status:   Code Status: DNR Family Communication: Daughter present at bedside,  updated The above findings and plan of care has been discussed with patient and family in detail,  they expressed understanding and agreement of above. -Advance care planning has been discussed.   Admission status:     Status is: Inpatient  Remains inpatient appropriate because:Inpatient level of care appropriate due to severity of illness   Dispo: The patient is from: Home              Anticipated d/c is to: Home              Anticipated d/c date is: Likely in a.m. if tolerating p.o., if blood sugar improved, leukocytosis improved.              Patient currently is not medically stable to d/c.        Procedures:   No admission procedures for hospital encounter.     Antimicrobials:  Anti-infectives (From admission, onward)   Start     Dose/Rate Route Frequency Ordered Stop   11/26/19 2200  piperacillin-tazobactam (ZOSYN) IVPB 2.25 g        2.25 g 100 mL/hr over 30 Minutes Intravenous Every 6 hours 11/26/19 1405     11/26/19 1200  piperacillin-tazobactam (ZOSYN) IVPB 3.375 g  Status:  Discontinued        3.375 g 100 mL/hr over 30 Minutes Intravenous Every 8 hours 11/26/19 1123 11/26/19 1405   11/24/19 1230  cefTRIAXone (ROCEPHIN) 1 g in sodium chloride 0.9 % 100 mL IVPB  Status:  Discontinued        1 g 200 mL/hr over 30 Minutes Intravenous Every 24 hours 11/24/19 1200 11/26/19 1123   11/23/19 1015  cefTRIAXone (ROCEPHIN) 1 g in sodium chloride 0.9 % 100 mL IVPB        1 g 200 mL/hr over 30 Minutes Intravenous  Once 11/23/19 1003 11/23/19 1259       Medication:   acidophilus  2 capsule Oral TID   amLODipine  10 mg Oral Daily   aspirin EC  81 mg Oral Daily   buPROPion  300 mg Oral Daily   FLUoxetine  40 mg Oral Daily   insulin aspart  0-15 Units Subcutaneous TID WC   insulin aspart  0-5 Units Subcutaneous QHS   insulin detemir  10 Units Subcutaneous Daily   metoprolol succinate  25 mg Oral Daily   pantoprazole  40 mg Oral Daily   raloxifene  60 mg  Oral Daily   simvastatin  20 mg Oral q1800   sucralfate  1 g Oral TID WC & HS    acetaminophen **OR** acetaminophen, dextrose, ondansetron **OR** ondansetron (ZOFRAN) IV   Objective:   Vitals:   11/26/19 1007 11/26/19 1601 11/27/19 0025 11/27/19 0920  BP: (!) 155/60 (!) 130/58 (!) 121/49 (!) 140/47  Pulse: 75 66 67 78  Resp:  $Remo'16 17 16  'DQmDC$ Temp:  98.7 F (37.1 C) 99.1 F (37.3 C) 99.5 F (37.5 C)  TempSrc:  Oral Oral Oral  SpO2:  100% 98% 100%  Weight:      Height:        Intake/Output Summary (Last 24 hours) at 11/27/2019 1329 Last data filed at  11/27/2019 1004 Gross per 24 hour  Intake 2098.64 ml  Output 900 ml  Net 1198.64 ml   Filed Weights   11/22/19 2139 11/25/19 1544  Weight: 62.6 kg 62.6 kg     Examination:        Physical Exam:   General:  Alert, oriented, cooperative, no distress;   HEENT:  Normocephalic, PERRL, otherwise with in Normal limits   Neuro:  CNII-XII intact. , normal motor and sensation, reflexes intact   Lungs:   Clear to auscultation BL, Respirations unlabored, no wheezes / crackles  Cardio:    S1/S2, RRR, No murmure, No Rubs or Gallops   Abdomen:   Soft, non-tender, bowel sounds active all four quadrants,  no guarding or peritoneal signs.  Muscular skeletal:   Generalized weaknesses, Limited exam - in bed, able to move all 4 extremities, Normal strength,  2+ pulses,  symmetric, No pitting edema  Skin:  Dry, warm to touch, negative for any Rashes, No open wounds  Wounds: Please see nursing documentation            ------------------------------------------------------------------------------------------------------------------------------------------    LABs:  CBC Latest Ref Rng & Units 11/27/2019 11/26/2019 11/25/2019  WBC 4.0 - 10.5 K/uL 14.1(H) 17.2(H) 15.2(H)  Hemoglobin 12.0 - 15.0 g/dL 8.5(L) 10.6(L) 10.0(L)  Hematocrit 36 - 46 % 26.4(L) 31.6(L) 29.3(L)  Platelets 150 - 400 K/uL 498(H) 584(H) 572(H)   CMP Latest Ref Rng  & Units 11/27/2019 11/26/2019 11/25/2019  Glucose 70 - 99 mg/dL 186(H) 184(H) 169(H)  BUN 8 - 23 mg/dL 41(H) 43(H) 43(H)  Creatinine 0.44 - 1.00 mg/dL 2.07(H) 2.21(H) 2.05(H)  Sodium 135 - 145 mmol/L 142 138 137  Potassium 3.5 - 5.1 mmol/L 3.9 4.1 3.5  Chloride 98 - 111 mmol/L 112(H) 105 106  CO2 22 - 32 mmol/L 21(L) 23 21(L)  Calcium 8.9 - 10.3 mg/dL 7.2(L) 8.2(L) 7.9(L)  Total Protein 6.5 - 8.1 g/dL - - -  Total Bilirubin 0.3 - 1.2 mg/dL - - -  Alkaline Phos 38 - 126 U/L - - -  AST 15 - 41 U/L - - -  ALT 0 - 44 U/L - - -       Micro Results Recent Results (from the past 240 hour(s))  SARS Coronavirus 2 by RT PCR (hospital order, performed in [facility] hospital lab) Nasopharyngeal Nasopharyngeal Swab     Status: None   Collection Time: 11/23/19 12:00 AM   Specimen: Nasopharyngeal Swab  Result Value Ref Range Status   SARS Coronavirus 2 NEGATIVE NEGATIVE Final    Comment: (NOTE) SARS-CoV-2 target nucleic acids are NOT DETECTED.  The SARS-CoV-2 RNA is generally detectable in upper and lower respiratory specimens during the acute phase of infection. The lowest concentration of SARS-CoV-2 viral copies this assay can detect is 250 copies / mL. A negative result does not preclude SARS-CoV-2 infection and should not be used as the sole basis for treatment or other patient management decisions.  A negative result may occur with improper specimen collection / handling, submission of specimen other than nasopharyngeal swab, presence of viral mutation(s) within the areas targeted by this assay, and inadequate number of viral copies (<250 copies / mL). A negative result must be combined with clinical observations, patient history, and epidemiological information.  Fact Sheet for Patients:   StrictlyIdeas.no  Fact Sheet for Healthcare Providers: BankingDealers.co.za  This test is not yet approved or  cleared by the [place] FDA  and has been authorized for detection and/or diagnosis of  SARS-CoV-2 by FDA under an Emergency Use Authorization (EUA).  This EUA will remain in effect (meaning this test can be used) for the duration of the COVID-19 declaration under Section 564(b)(1) of the Act, 21 U.S.C. section 360bbb-3(b)(1), unless the authorization is terminated or revoked sooner.  Performed at [facility], [address] [place], [place] [address]   Blood culture (routine x 2)     Status: None (Preliminary result)   Collection Time: 11/23/19  6:33 AM   Specimen: BLOOD  Result Value Ref Range Status   Specimen Description BLOOD LEFT ANTECUBITAL  Final   Special Requests   Final    BOTTLES DRAWN AEROBIC AND ANAEROBIC Blood Culture adequate volume   Culture   Final    NO GROWTH 4 DAYS Performed at [facility], [address] [place], [place] [address]    Report Status PENDING  Incomplete  Blood culture (routine x 2)     Status: None (Preliminary result)   Collection Time: 11/23/19  6:55 AM   Specimen: BLOOD  Result Value Ref Range Status   Specimen Description BLOOD RIGHT ANTECUBITAL  Final   Special Requests   Final    BOTTLES DRAWN AEROBIC AND ANAEROBIC Blood Culture adequate volume   Culture   Final    NO GROWTH 4 DAYS Performed at [facility], [address] [place], [place] [address]    Report Status PENDING  Incomplete  Urine culture     Status: Abnormal   Collection Time: 11/23/19 12:24 PM   Specimen: Urine, Random  Result Value Ref Range Status   Specimen Description   Final    URINE, RANDOM Performed at [facility], [address] [place], [place] [address]    Special Requests   Final    NONE Performed at [facility], [address] [place], [place] [address]    Culture (A)  Final    <10,000 COLONIES/mL INSIGNIFICANT GROWTH Performed at [facility], [place] [address], [place], [place] [address]    Report Status 11/25/2019  FINAL  Final  C Difficile Quick Screen w PCR reflex     Status: None   Collection Time: 11/24/19  6:26 AM   Specimen: STOOL  Result Value Ref Range Status   C Diff antigen NEGATIVE NEGATIVE Final   C Diff toxin NEGATIVE NEGATIVE Final   C Diff interpretation No C. difficile detected.  Final    Comment: Performed at [facility], [address] [place], [place] [address]  CULTURE, BLOOD (ROUTINE X 2) w Reflex to ID Panel     Status: None (Preliminary result)   Collection Time: 11/26/19 12:12 PM   Specimen: BLOOD  Result Value Ref Range Status   Specimen Description BLOOD RIGHT [facility]  Final   Special Requests   Final    BOTTLES DRAWN AEROBIC AND ANAEROBIC Blood Culture adequate volume   Culture   Final    NO GROWTH < 24 HOURS Performed at [facility], [address] [place], [place] [address]    Report Status PENDING  Incomplete  CULTURE, BLOOD (ROUTINE X 2) w Reflex to ID Panel     Status: None (Preliminary result)   Collection Time: 11/26/19 12:17 PM   Specimen: BLOOD RIGHT HAND  Result Value Ref Range Status   Specimen Description BLOOD RIGHT HAND  Final   Special Requests   Final    BOTTLES DRAWN AEROBIC AND ANAEROBIC Blood Culture adequate volume  Culture   Final    NO GROWTH < 24 HOURS Performed at [facility], [address] [place], [place] [address]    Report Status PENDING  Incomplete    Radiology Reports CT ABDOMEN PELVIS WO CONTRAST  Result Date: 11/23/2019 CLINICAL DATA:  Acute nonlocalized abdominal pain, generalized weakness, and decreased appetite for 2 days EXAM: CT ABDOMEN AND PELVIS WITHOUT CONTRAST TECHNIQUE: Multidetector CT imaging of the abdomen and pelvis was performed following the standard protocol without IV contrast. Sagittal and coronal MPR images reconstructed from axial data set. No oral contrast administered. COMPARISON:  None FINDINGS: Lower chest: Lung bases emphysematous but clear Hepatobiliary: Gallbladder  surgically absent.  Liver unremarkable. Pancreas: Normal appearance Spleen: Small but otherwise normal appearance Adrenals/Urinary Tract: BILATERAL adrenal gland thickening without discrete mass. Tiny nonobstructing LEFT renal calculus. Cortical scarring at anterior inferior LEFT kidney. No renal mass, hydronephrosis, or hydroureter. Thickened anterior superior bladder wall with adjacent infiltrative change, could represent cystitis though tumor not excluded with this appearance. Stomach/Bowel: Normal appendix. Large duodenal diverticulum. Moderate-sized hiatal hernia with wall thickening in the hernia sac, question artifact from underdistention, cannot exclude mass. Associated thickening of the distal esophageal wall. Remainder stomach decompressed. Diverticulosis of transverse through sigmoid colon with single diverticula noted at ascending colon. No colonic wall thickening or pericolic infiltrative changes to suggest acute diverticulitis. Remaining bowel loops unremarkable. Vascular/Lymphatic: Extensive atherosclerotic calcifications aorta, visceral arteries, iliac arteries. 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 Multiple pelvic phleboliths. No adenopathy. Reproductive: Unremarkable uterus, ovaries, and adnexa Other: No free air or free fluid. Small cyst identified within LEFT perineum at LEFT labia, question Bartholin cyst 1.8 x 1.3 cm. Musculoskeletal: Osseous demineralization with degenerative disc and facet disease changes of lower lumbar spine associated with grade 1 anterolisthesis L4-L5. IMPRESSION: Extensive colonic diverticulosis without evidence of diverticulitis. Moderate-sized hiatal hernia with wall thickening in the hernia sac, question artifact from underdistention, as well as thickening of distal esophageal wall; consider endoscopy to exclude gastric neoplasm if clinically indicated based on patient age and comorbidities. Tiny nonobstructing LEFT renal calculus.  Thickened anterior superior bladder wall with adjacent infiltrative change, could represent cystitis though tumor not excluded with this appearance; recommend correlation with urinalysis. Small cyst within LEFT perineum at LEFT labia, question Bartholin cyst 1.8 x 1.3 cm. Aortic Atherosclerosis (ICD10-I70.0) and Emphysema (ICD10-J43.9). Electronically Signed   By: Lavonia Dana M.D.   On: 11/23/2019 08:13   DG Chest 2 View  Result Date: 11/26/2019 CLINICAL DATA:  Weakness, fatigue. EXAM: CHEST - 2 VIEW COMPARISON:  November 22, 2019. FINDINGS: The heart size and mediastinal contours are within normal limits. Both lungs are clear. The visualized skeletal structures are unremarkable. IMPRESSION: No active cardiopulmonary disease. Aortic Atherosclerosis (ICD10-I70.0). Electronically Signed   By: Marijo Conception M.D.   On: 11/26/2019 09:47   DG Chest 2 View  Result Date: 11/23/2019 CLINICAL DATA:  Weakness EXAM: CHEST - 2 VIEW COMPARISON:  None. FINDINGS: The heart size and mediastinal contours are within normal limits. Both lungs are clear. The visualized skeletal structures are unremarkable. IMPRESSION: No active cardiopulmonary disease. Electronically Signed   By: Ulyses Jarred M.D.   On: 11/23/2019 00:07   US RENAL  Result Date: 11/23/2019 CLINICAL DATA:  Acute kidney injury. EXAM: RENAL / URINARY TRACT ULTRASOUND COMPLETE COMPARISON:  CT same day FINDINGS: Right Kidney: Renal measurements: 12.2 x 5.1 x 5.3 cm = volume: 183 mL. Echogenicity within normal limits. No mass or hydronephrosis visualized. Left  Kidney: Renal measurements: 10.9 x 5.3 x 4.5 cm = volume: 135 mL. Echogenicity within normal limits. No mass or hydronephrosis visualized. Mild volume loss lower pole. Bladder: Appears normal for degree of bladder distention. Other: None. IMPRESSION: Study within normal limits on the right. Normal size and echogenicity. No hydronephrosis. On the left, there is some scarring/volume loss at the lower pole but  the kidney appears otherwise normal sonographically. Tiny nonobstructing stones seen by CT not identified by ultrasound. Electronically Signed   By: Nelson Chimes M.D.   On: 11/23/2019 15:06    SIGNED: Deatra James, MD, FACP, FHM. [facility],  Pager (please use [email or web address] to [email or web address])  If 7PM-7AM, please contact night-coverage [email or web address] Ota [facility] 11/27/2019, 1:29 PM

## 2019-11-27 NOTE — Progress Notes (Signed)
Pt with red urine.  MD notified. MD d/c heparin and ordered SCD's.

## 2019-11-27 NOTE — Progress Notes (Signed)
Inpatient Diabetes Program Recommendations  AACE/ADA: New Consensus Statement on Inpatient Glycemic Control   Target Ranges:  Prepandial:   less than 140 mg/dL      Peak postprandial:   less than 180 mg/dL (1-2 hours)      Critically ill patients:  140 - 180 mg/dL   Results for SIREN, PORRATA (MRN [id number]) as of 11/27/2019 11:50  Ref. Range 11/26/2019 08:08 11/26/2019 12:35 11/26/2019 17:04 11/26/2019 22:06 11/27/2019 08:12  Glucose-Capillary Latest Ref Range: 70 - 99 mg/dL 638 (H) 466 (H) 599 (H) 221 (H) 180 (H)   Review of Glycemic Control  Diabetes history: DM2 Outpatient Diabetes medications: Glipizide 10 mg daily, Januvia 50 mg daily, Metformin 1000 mg BID Current orders for Inpatient glycemic control: Levemir 10 units daily, Novolog 0-15 units TID with meals, Novolog 0-5 units QHS  Inpatient Diabetes Program Recommendations:    Insulin- Post prandial glucose is consistently elevated. Please consider ordering Novolog 3 units TID with meals for meal coverage if patient eats at least 50% of meals.  Thanks, Orlando Penner, RN, MSN, CDE Diabetes Coordinator Inpatient Diabetes Program [phone] (Team Pager from 8am to 5pm)

## 2019-11-27 NOTE — Care Management Important Message (Signed)
Important Message  Patient Details  Name: Amanda Benson MRN: [id number] Date of Birth: 06-Dec-1935   Medicare Important Message Given:  Yes   Initial Medicare IM given by Patient Access Associate on 11/26/2019 at 2:33pm.   [name] 11/27/2019, 8:56 AM

## 2019-11-28 LAB — BASIC METABOLIC PANEL
Anion gap: 10 (ref 5–15)
BUN: 29 mg/dL — ABNORMAL HIGH (ref 8–23)
CO2: 22 mmol/L (ref 22–32)
Calcium: 7.7 mg/dL — ABNORMAL LOW (ref 8.9–10.3)
Chloride: 110 mmol/L (ref 98–111)
Creatinine, Ser: 1.83 mg/dL — ABNORMAL HIGH (ref 0.44–1.00)
GFR calc Af Amer: 29 mL/min — ABNORMAL LOW (ref >60)
GFR calc non Af Amer: 25 mL/min — ABNORMAL LOW (ref >60)
Glucose, Bld: 179 mg/dL — ABNORMAL HIGH (ref 70–99)
Potassium: 3.6 mmol/L (ref 3.5–5.1)
Sodium: 142 mmol/L (ref 135–145)

## 2019-11-28 LAB — GLUCOSE, CAPILLARY
Glucose-Capillary: 168 mg/dL — ABNORMAL HIGH (ref 70–99)
Glucose-Capillary: 221 mg/dL — ABNORMAL HIGH (ref 70–99)
Glucose-Capillary: 191 mg/dL — ABNORMAL HIGH (ref 70–99)
Glucose-Capillary: 241 mg/dL — ABNORMAL HIGH (ref 70–99)
Glucose-Capillary: 187 mg/dL — ABNORMAL HIGH (ref 70–99)

## 2019-11-28 LAB — CBC
HCT: 27.4 % — ABNORMAL LOW (ref 36.0–46.0)
Hemoglobin: 9.3 g/dL — ABNORMAL LOW (ref 12.0–15.0)
MCH: 29.2 pg (ref 26.0–34.0)
MCHC: 33.9 g/dL (ref 30.0–36.0)
MCV: 86.2 fL (ref 80.0–100.0)
Platelets: 527 10*3/uL — ABNORMAL HIGH (ref 150–400)
RBC: 3.18 MIL/uL — ABNORMAL LOW (ref 3.87–5.11)
RDW: 13.4 % (ref 11.5–15.5)
WBC: 17.8 10*3/uL — ABNORMAL HIGH (ref 4.0–10.5)
nRBC: 0 % (ref 0.0–0.2)

## 2019-11-28 LAB — CULTURE, BLOOD (ROUTINE X 2)
Culture: NO GROWTH
Culture: NO GROWTH
Special Requests: ADEQUATE
Special Requests: ADEQUATE

## 2019-11-28 MED ORDER — PIPERACILLIN-TAZOBACTAM 3.375 G IVPB
3.3750 g | Freq: Three times a day (TID) | INTRAVENOUS | Status: DC
  Administered 2019-11-28 – 2019-11-29 (×3): 3.375 g via INTRAVENOUS
  Filled 2019-11-28 (×3): qty 50

## 2019-11-28 NOTE — Progress Notes (Signed)
PROGRESS NOTE    Amanda Benson  CLE:[id number] DOB: February 23, 1936 DOA: 11/23/2019 PCP: Amanda Pimple, MD    Brief Narrative:  Patient admitted to the hospital with working diagnosis of diabetes ketoacidosis in the setting of sepsis due to urinary tract infection, present on admission.  84 year old female with significant past medical history for type 2 diabetes mellitus, hypertension and dyslipidemia.  Patient reported 2-week history of nausea, vomiting and diarrhea.  GI symptoms associated with abdominal pain and dysuria.  Patient physical examination blood pressure 155/49, heart rate 76, oxygen saturation 90%, she had dry mucous membranes lungs clear to auscultation bilaterally, heart S1-S2, present rhythmic, soft abdomen, no lower extremity edema. Sodium 135, potassium 4.0, chloride 102, bicarb 17, glucose 315, BUN 52, creatinine 3.42, anion gap 16, white count 23.7, hemoglobin 8.4, hematocrit 35.3, platelets 661.  SARS COVID-19 negative. Urine analysis more than 50 white cells, more than 50 red cells, specific gravity 1.013, 100 protein.  Patient received intravenous insulin for correction of ketoacidosis.  Diagnosed with sepsis due to urinary tract infection, placed on broad-spectrum antibiotic therapy.  Stool was negative for C. Difficile.   Assessment & Plan:   Principal Problem:   DKA (diabetic ketoacidoses) (HCC) Active Problems:   Essential hypertension   Depression with anxiety   AKI (acute kidney injury) (HCC)   Leukocytosis   1. T2DM uncontrolled with DKA/ dyspidemia. Ketoacidosis has resolved. Her fasting glucose is 179 this am, anion gap 10.   Continue glucose cover and monitoring with insulin sliding scale. Patient is tolerating well po. Basal insulin with 10 units levemir.   On statin therapy.   2. HTN. Continue blood pressure control with amlodipine and metoprolol.   3. AKI on CKD stage 4Renal function with serum cr at 1,83 with K at 3,6 and serum bicarbonate at  22. Will discontinue IV fluids for now.   Will continue close follow up on renal function and electrolytes, avoid hypotension or nephrotoxic medications.   4. Depression and anxiety. Continue with bupropion, fluoxetine,   5. Sepsis due to urine infection, end-organ failure AKI, present on admission. Continue Zosyn for antibiotic therapy.    Status is: Inpatient  Remains inpatient appropriate because:Inpatient level of care appropriate due to severity of illness   Dispo: The patient is from: Home              Anticipated d/c is to: Home              Anticipated d/c date is: 1 day              Patient currently is not medically stable to d/c.   DVT prophylaxis: Enoxaparin   Code Status:   dnr  Family Communication:  No family at the bedside     Antimicrobials:   zosyn    Subjective: Patient with no nausea or vomiting, no dyspnea or chest pain, tolerating po well, she has not been out of the bed yet. Feeling very weak and deconditioned.   Objective: Vitals:   11/27/19 1619 11/28/19 0103 11/28/19 0737 11/28/19 1620  BP: (!) 116/52 (!) 130/55 (!) 164/62 (!) 140/50  Pulse: 71 73 80 75  Resp: 16 16 16 18   Temp: 97.8 F (36.6 C) 99.7 F (37.6 C) 99.5 F (37.5 C) 98.1 F (36.7 C)  TempSrc: Oral Oral Oral Oral  SpO2: 98% 93% 93% 93%  Weight:      Height:        Intake/Output Summary (Last 24 hours) at  11/28/2019 1709 Last data filed at 11/28/2019 1613 Gross per 24 hour  Intake 2569.47 ml  Output 2350 ml  Net 219.47 ml   Filed Weights   11/22/19 2139 11/25/19 1544  Weight: 62.6 kg 62.6 kg    Examination:   General: Not in pain or dyspnea, deconditioned  Neurology: Awake and alert, non focal  E ENT: mild pallor, no icterus, oral mucosa moist Cardiovascular: No JVD. S1-S2 present, rhythmic, no gallops, rubs, or murmurs. No lower extremity edema. Pulmonary: positive breath sounds bilaterally, adequate air movement, no wheezing, rhonchi or rales. Gastrointestinal.  Abdomen soft and non tender Skin. No rashes Musculoskeletal: no joint deformities     Data Reviewed: I have personally reviewed following labs and imaging studies  CBC: Recent Labs  Lab 11/22/19 2144 11/22/19 2144 11/24/19 0505 11/25/19 0537 11/26/19 0409 11/27/19 0513 11/28/19 0608  WBC 22.5*  22.7*   < > 16.8* 15.2* 17.2* 14.1* 17.8*  NEUTROABS 19.5*  --   --   --   --   --   --   HGB 11.4*  11.4*   < > 10.6* 10.0* 10.6* 8.5* 9.3*  HCT 35.7*  35.3*   < > 32.0* 29.3* 31.6* 26.4* 27.4*  MCV 90.2  89.1   < > 87.0 84.7 86.3 89.5 86.2  PLT 701*  661*   < > 584* 572* 584* 498* 527*   < > = values in this interval not displayed.   Basic Metabolic Panel: Recent Labs  Lab 11/24/19 0505 11/25/19 0537 11/26/19 0409 11/27/19 0513 11/28/19 0608  NA 138 137 138 142 142  K 3.8 3.5 4.1 3.9 3.6  CL 106 106 105 112* 110  CO2 24 21* 23 21* 22  GLUCOSE 232* 169* 184* 186* 179*  BUN 52* 43* 43* 41* 29*  CREATININE 2.80* 2.05* 2.21* 2.07* 1.83*  CALCIUM 8.4* 7.9* 8.2* 7.2* 7.7*   GFR: Estimated Creatinine Clearance: 20.6 mL/min (A) (by C-G formula based on SCr of 1.83 mg/dL (H)). Liver Function Tests: Recent Labs  Lab 11/22/19 2144  AST 19  ALT 11  ALKPHOS 63  BILITOT 0.5  PROT 7.0  ALBUMIN 2.6*   No results for input(s): LIPASE, AMYLASE in the last 168 hours. No results for input(s): AMMONIA in the last 168 hours. Coagulation Profile: No results for input(s): INR, PROTIME in the last 168 hours. Cardiac Enzymes: No results for input(s): CKTOTAL, CKMB, CKMBINDEX, TROPONINI in the last 168 hours. BNP (last 3 results) No results for input(s): PROBNP in the last 8760 hours. HbA1C: No results for input(s): HGBA1C in the last 72 hours. CBG: Recent Labs  Lab 11/27/19 2023 11/28/19 0738 11/28/19 1145 11/28/19 1312 11/28/19 1641  GLUCAP 200* 168* 221* 191* 241*   Lipid Profile: No results for input(s): CHOL, HDL, LDLCALC, TRIG, CHOLHDL, LDLDIRECT in the last 72  hours. Thyroid Function Tests: No results for input(s): TSH, T4TOTAL, FREET4, T3FREE, THYROIDAB in the last 72 hours. Anemia Panel: No results for input(s): VITAMINB12, FOLATE, FERRITIN, TIBC, IRON, RETICCTPCT in the last 72 hours.    Radiology Studies: I have reviewed all of the imaging during this hospital visit personally     Scheduled Meds: . acidophilus  2 capsule Oral TID  . amLODipine  10 mg Oral Daily  . aspirin EC  81 mg Oral Daily  . buPROPion  300 mg Oral Daily  . FLUoxetine  40 mg Oral Daily  . insulin aspart  0-15 Units Subcutaneous TID WC  . insulin aspart  0-5 Units Subcutaneous QHS  . insulin detemir  10 Units Subcutaneous Daily  . metoprolol succinate  25 mg Oral Daily  . pantoprazole  40 mg Oral Daily  . raloxifene  60 mg Oral Daily  . simvastatin  20 mg Oral q1800  . sucralfate  1 g Oral TID WC & HS   Continuous Infusions: . lactated ringers 125 mL/hr at 11/28/19 0806  . piperacillin-tazobactam (ZOSYN)  IV 3.375 g (11/28/19 0847)     LOS: 5 days        Amanda Calvert Annett Gula, MD

## 2019-11-28 NOTE — Progress Notes (Signed)
Inpatient Diabetes Program Recommendations  AACE/ADA: New Consensus Statement on Inpatient Glycemic Control   Target Ranges:  Prepandial:   less than 140 mg/dL      Peak postprandial:   less than 180 mg/dL (1-2 hours)      Critically ill patients:  140 - 180 mg/dL   Results for Amanda Benson, Amanda Benson (MRN [id number]) as of 11/28/2019 08:30  Ref. Range 11/27/2019 08:12 11/27/2019 12:01 11/27/2019 16:23 11/27/2019 20:23 11/28/2019 07:38  Glucose-Capillary Latest Ref Range: 70 - 99 mg/dL 542 (H) 706 (H) 237 (H) 200 (H) 168 (H)   Review of Glycemic Control  Diabetes history:DM2 Outpatient Diabetes medications:Glipizide 10 mg daily, Januvia 50 mg daily, Metformin 1000 mg BID Current orders for Inpatient glycemic control:Levemir 10 units daily, Novolog 0-15 units TID with meals, Novolog 0-5 units QHS  Inpatient Diabetes Program Recommendations:  Insulin- Post prandial glucose is consistently elevated. Please consider ordering Novolog 3 units TID with meals for meal coverage if patient eats at least 50% of meals.  Thanks, Amanda Penner, RN, MSN, CDE Diabetes Coordinator Inpatient Diabetes Program [phone] (Team Pager from 8am to 5pm)

## 2019-11-29 LAB — GLUCOSE, CAPILLARY
Glucose-Capillary: 223 mg/dL — ABNORMAL HIGH (ref 70–99)
Glucose-Capillary: 191 mg/dL — ABNORMAL HIGH (ref 70–99)
Glucose-Capillary: 133 mg/dL — ABNORMAL HIGH (ref 70–99)
Glucose-Capillary: 146 mg/dL — ABNORMAL HIGH (ref 70–99)

## 2019-11-29 LAB — CBC
HCT: 28.1 % — ABNORMAL LOW (ref 36.0–46.0)
Hemoglobin: 9.4 g/dL — ABNORMAL LOW (ref 12.0–15.0)
MCH: 28.7 pg (ref 26.0–34.0)
MCHC: 33.5 g/dL (ref 30.0–36.0)
MCV: 85.7 fL (ref 80.0–100.0)
Platelets: 566 10*3/uL — ABNORMAL HIGH (ref 150–400)
RBC: 3.28 MIL/uL — ABNORMAL LOW (ref 3.87–5.11)
RDW: 13.4 % (ref 11.5–15.5)
WBC: 23.1 10*3/uL — ABNORMAL HIGH (ref 4.0–10.5)
nRBC: 0.2 % (ref 0.0–0.2)

## 2019-11-29 LAB — BASIC METABOLIC PANEL
Anion gap: 10 (ref 5–15)
BUN: 26 mg/dL — ABNORMAL HIGH (ref 8–23)
CO2: 23 mmol/L (ref 22–32)
Calcium: 7.4 mg/dL — ABNORMAL LOW (ref 8.9–10.3)
Chloride: 106 mmol/L (ref 98–111)
Creatinine, Ser: 1.82 mg/dL — ABNORMAL HIGH (ref 0.44–1.00)
GFR calc Af Amer: 29 mL/min — ABNORMAL LOW (ref >60)
GFR calc non Af Amer: 25 mL/min — ABNORMAL LOW (ref >60)
Glucose, Bld: 221 mg/dL — ABNORMAL HIGH (ref 70–99)
Potassium: 3.1 mmol/L — ABNORMAL LOW (ref 3.5–5.1)
Sodium: 139 mmol/L (ref 135–145)

## 2019-11-29 MED ORDER — POTASSIUM CHLORIDE CRYS ER 20 MEQ PO TBCR
40.0000 meq | EXTENDED_RELEASE_TABLET | ORAL | Status: AC
  Administered 2019-11-29 (×2): 40 meq via ORAL
  Filled 2019-11-29 (×2): qty 2

## 2019-11-29 NOTE — Progress Notes (Signed)
PROGRESS NOTE    Amanda Benson  AVW:[id number] DOB: 11/01/1935 DOA: 11/23/2019 PCP: Judy Pimple, MD    Brief Narrative:  Patient admitted to the hospital with working diagnosis of diabetes ketoacidosis in the setting of sepsis due to urinary tract infection, present on admission.  84 year old female with significant past medical history for type 2 diabetes mellitus, hypertension and dyslipidemia. Patient reported 2-week history of nausea, vomiting and diarrhea. GI symptoms associated with abdominal pain and dysuria.On her initial physical examination her blood pressure 155/49, heart rate 76, oxygen saturation 90%, she had dry mucous membranes lungs clear to auscultation bilaterally, heart S1-S2, present rhythmic, soft abdomen, no lower extremity edema. Sodium 135, potassium 4.0, chloride 102, bicarb 17, glucose 315, BUN 52, creatinine 3.42, anion gap 16, white count 23.7, hemoglobin 8.4, hematocrit 35.3,platelets 661.SARS COVID-19 negative. Urine analysis more than 50 white cells, more than 50 red cells, specific gravity 1.013, 100 protein.  Patient received intravenous insulin for correction of ketoacidosis. Diagnosed with sepsis due to urinary tract infection, placed on broad-spectrum antibiotic therapy.Stool was negative for C. Difficile.   Assessment & Plan:   Principal Problem:   DKA (diabetic ketoacidoses) (HCC) Active Problems:   Essential hypertension   Depression with anxiety   AKI (acute kidney injury) (HCC)   Leukocytosis    1.  Uncontrolled type 2 diabetes mellitus, diabetes ketoacidosis, dyslipidemia.  Patient was admitted to the medical ward, she received insulin intravenously with improvement of anion gap and glucose.  She was placed on basal insulin 10 units of Levemir along with insulin sliding scale with good tolerance. At discharge patient will resume oral antihyperglycemic agents.  Her hemoglobin A1c 7.3.  Follow-up as an outpatient, if persistent  uncontrolled to consider insulin therapy.  Continue simvastatin.  2.  Sepsis due to urine tract infection, present on admission, endorgan damage acute kidney injury.  Patient received supportive medical therapy with intravenous fluids and broad-spectrum antibiotic therapy.  Her urine culture grew less than 10,000 unit, (unspecified organism).  2 sets of blood cultures remain no growth.  Patient completed antibiotic therapy during her hospitalization.  Further work-up with CT of the abdomen showed thickening anterior superior bladder wall with at least infiltrate change.  Ultrasound of the abdomen with no acute changes, no hydronephrosis.  Today continue to have leukocytosis, up to 23.1. No clinical signs of persistent infection, will dc Zosyn and will follow cell count in am.   3.  Hypertension/ peripheral vascular disease.  Continue blood pressure control with amlodipine and metoprolol.  Continue with raloxifen.   4.  Depression.  Continue bupropion and fluoxetine.  5. AKI on CKD stage 3b/ hypokalemia. Renal function stable with serum cr at 1,8, it is possible that is her new baseline. K is 3.1 with serum bicarbonate at 23.  Add 40 meq q 4H x2, will follow up on renal function and electrolytes in am.   6. Uncontrolled T2DM Hgb A1c 7.3/ dyslipidemia. Fasting glucose this am is 221 mg/dl.  Continue insulin sliding scale for glucose cover and monitoring, basal insulin 10 units levimir.  Continue simvastatin with good toleration.   Status is: Inpatient  Remains inpatient appropriate because:Inpatient level of care appropriate due to severity of illness   Dispo: The patient is from: Home              Anticipated d/c is to: Home              Anticipated d/c date is: 1 day  Patient currently is not medically stable to d/c. Follow with PT/OT for possible dc home in am if wbc improves.     DVT prophylaxis: Enoxaparin   Code Status:   dnr   Family Communication:  I  spoke with patient's daughter at the bedside, we talked in detail about patient's condition, plan of care and prognosis and all questions were addressed.     Subjective: Patient feeling better but not yet back to baseline, continue to be very weak and deconditioned, no nausea or vomiting.   Objective: Vitals:   11/28/19 0737 11/28/19 1620 11/29/19 0037 11/29/19 0813  BP: (!) 164/62 (!) 140/50 (!) 151/64 (!) 157/63  Pulse: 80 75 79 84  Resp: 16 18 16 17   Temp: 99.5 F (37.5 C) 98.1 F (36.7 C) 99.7 F (37.6 C) 99.2 F (37.3 C)  TempSrc: Oral Oral Oral Oral  SpO2: 93% 93% (!) 85% 92%  Weight:      Height:        Intake/Output Summary (Last 24 hours) at 11/29/2019 1041 Last data filed at 11/29/2019 0950 Gross per 24 hour  Intake 234.85 ml  Output 1050 ml  Net -815.15 ml   Filed Weights   11/22/19 2139 11/25/19 1544  Weight: 62.6 kg 62.6 kg    Examination:   General: Not in pain or dyspnea, deconditioned  Neurology: Awake and alert, non focal  E ENT: mild pallor, no icterus, oral mucosa moist Cardiovascular: No JVD. S1-S2 present, rhythmic, no gallops, rubs, or murmurs. No lower extremity edema. Pulmonary: positive breath sounds bilaterally, adequate air movement, no wheezing, rhonchi or rales. Gastrointestinal. Abdomen soft and non tender Skin. No rashes Musculoskeletal: no joint deformities     Data Reviewed: I have personally reviewed following labs and imaging studies  CBC: Recent Labs  Lab 11/22/19 2144 11/24/19 0505 11/25/19 0537 11/26/19 0409 11/27/19 0513 11/28/19 0608 11/29/19 0430  WBC 22.5*  22.7*   < > 15.2* 17.2* 14.1* 17.8* 23.1*  NEUTROABS 19.5*  --   --   --   --   --   --   HGB 11.4*  11.4*   < > 10.0* 10.6* 8.5* 9.3* 9.4*  HCT 35.7*  35.3*   < > 29.3* 31.6* 26.4* 27.4* 28.1*  MCV 90.2  89.1   < > 84.7 86.3 89.5 86.2 85.7  PLT 701*  661*   < > 572* 584* 498* 527* 566*   < > = values in this interval not displayed.   Basic Metabolic  Panel: Recent Labs  Lab 11/25/19 0537 11/26/19 0409 11/27/19 0513 11/28/19 0608 11/29/19 0430  NA 137 138 142 142 139  K 3.5 4.1 3.9 3.6 3.1*  CL 106 105 112* 110 106  CO2 21* 23 21* 22 23  GLUCOSE 169* 184* 186* 179* 221*  BUN 43* 43* 41* 29* 26*  CREATININE 2.05* 2.21* 2.07* 1.83* 1.82*  CALCIUM 7.9* 8.2* 7.2* 7.7* 7.4*   GFR: Estimated Creatinine Clearance: 20.7 mL/min (A) (by C-G formula based on SCr of 1.82 mg/dL (H)). Liver Function Tests: Recent Labs  Lab 11/22/19 2144  AST 19  ALT 11  ALKPHOS 63  BILITOT 0.5  PROT 7.0  ALBUMIN 2.6*   No results for input(s): LIPASE, AMYLASE in the last 168 hours. No results for input(s): AMMONIA in the last 168 hours. Coagulation Profile: No results for input(s): INR, PROTIME in the last 168 hours. Cardiac Enzymes: No results for input(s): CKTOTAL, CKMB, CKMBINDEX, TROPONINI in the last 168 hours. BNP (  last 3 results) No results for input(s): PROBNP in the last 8760 hours. HbA1C: No results for input(s): HGBA1C in the last 72 hours. CBG: Recent Labs  Lab 11/28/19 1145 11/28/19 1312 11/28/19 1641 11/28/19 2158 11/29/19 0801  GLUCAP 221* 191* 241* 187* 223*   Lipid Profile: No results for input(s): CHOL, HDL, LDLCALC, TRIG, CHOLHDL, LDLDIRECT in the last 72 hours. Thyroid Function Tests: No results for input(s): TSH, T4TOTAL, FREET4, T3FREE, THYROIDAB in the last 72 hours. Anemia Panel: No results for input(s): VITAMINB12, FOLATE, FERRITIN, TIBC, IRON, RETICCTPCT in the last 72 hours.    Radiology Studies: I have reviewed all of the imaging during this hospital visit personally     Scheduled Meds: . acidophilus  2 capsule Oral TID  . amLODipine  10 mg Oral Daily  . aspirin EC  81 mg Oral Daily  . buPROPion  300 mg Oral Daily  . FLUoxetine  40 mg Oral Daily  . insulin aspart  0-15 Units Subcutaneous TID WC  . insulin aspart  0-5 Units Subcutaneous QHS  . insulin detemir  10 Units Subcutaneous Daily  .  metoprolol succinate  25 mg Oral Daily  . pantoprazole  40 mg Oral Daily  . potassium chloride  40 mEq Oral Q4H  . raloxifene  60 mg Oral Daily  . simvastatin  20 mg Oral q1800  . sucralfate  1 g Oral TID WC & HS   Continuous Infusions:   LOS: 6 days        Kymari Lollis Annett Gula, MD

## 2019-11-29 NOTE — Progress Notes (Signed)
PT Cancellation Note  Patient Details Name: Amanda Benson MRN: [id number] DOB: Feb 15, 1936   Cancelled Treatment:    Reason Eval/Treat Not Completed: Patient declined, no reason specified Attempted multiple times this AM, each time she was either needing to have BM or dealing with meds.  Spoke with OT who was able to work with her and states that she was able to do some limited ambulation and was very fatigued.  Suggests holding PT until this afternoon to let her rest this AM.   Malachi Pro, DPT 11/29/2019, 11:31 AM

## 2019-11-29 NOTE — Care Management Important Message (Signed)
Important Message  Patient Details  Name: Amanda Benson MRN: [id number] Date of Birth: April 01, 1936   Medicare Important Message Given:  Yes     [name] 11/29/2019, 12:40 PM

## 2019-11-29 NOTE — Evaluation (Signed)
Occupational Therapy Evaluation Patient Details Name: Amanda Benson MRN: [id number] DOB: 1935-06-03 Today's Date: 11/29/2019    History of Present Illness 84yo female admitted for diabetes ketoacidosis in the setting of sepsis due to urinary tract infection. PMHx includes depression, anxiety, HTN, DM2.   Clinical Impression   Pt was seen for OT evaluation this date. Prior to hospital admission, pt was indep with basic ADL, daughters assisting with transportation, med mgt, meal prep, and housekeeping tasks. Pt/dtr endorse pt has had at least 8 falls in past 8 months. Pt lives with her daughter and has other daughters nearby who assist. Pt on 2L O2 at start of session. Currently pt demonstrates impairments as described below (See OT problem list) which functionally limit her ability to perform ADL/self-care tasks. Pt currently requires Min-Mod A for ADL transfers and Min A for LB ADL tasks. Supervision for sup>sit EOB. O2/HR monitored throughout. Pt placed on room air for toileting in bathroom. Denied SOB. Once returned to sit EOB O2 sats 89% on room air. Nasal canula replaced and O2 sats up to 91%. Pt instructed in falls prevention strategies, functional ADL transfers and RW mgt during ADL tasks. Pt would benefit from skilled OT to address noted impairments and functional limitations (see below for any additional details) in order to maximize safety and independence while minimizing falls risk and caregiver burden. Upon hospital discharge, recommend HHOT to maximize pt safety and return to functional independence during meaningful occupations of daily life.     Follow Up Recommendations  Home health OT;Supervision/Assistance - 24 hour    Equipment Recommendations  None recommended by OT    Recommendations for Other Services       Precautions / Restrictions Precautions Precautions: Fall Restrictions Weight Bearing Restrictions: No      Mobility Bed Mobility Overal bed mobility: Needs  Assistance Bed Mobility: Supine to Sit;Sit to Supine     Supine to sit: Supervision;HOB elevated Sit to supine: Min guard   General bed mobility comments: additional time/effort to perform  Transfers Overall transfer level: Needs assistance Equipment used: Rolling walker (2 wheeled) Transfers: Sit to/from Stand Sit to Stand: Min assist         General transfer comment: cues for technique to scoot forward first and for hand placement, Min A to perform from bed, Mod A for toilet transfer (low toilet)    Balance Overall balance assessment: Needs assistance Sitting-balance support: No upper extremity supported;Feet supported Sitting balance-Leahy Scale: Fair     Standing balance support: Single extremity supported;Bilateral upper extremity supported;During functional activity Standing balance-Leahy Scale: Fair                             ADL either performed or assessed with clinical judgement   ADL Overall ADL's : Needs assistance/impaired                                       General ADL Comments: Mod A for toilet transfer with use of grab bar from low/std toilet; Min A for LB ADL tasks     Vision Baseline Vision/History: Wears glasses Wears Glasses: Reading only Patient Visual Report: No change from baseline       Perception     Praxis      Pertinent Vitals/Pain Pain Assessment: No/denies pain     Hand Dominance Left   Extremity/Trunk  Assessment Upper Extremity Assessment Upper Extremity Assessment: Generalized weakness   Lower Extremity Assessment Lower Extremity Assessment: Generalized weakness       Communication Communication Communication: HOH (hearing aide in R ear)   Cognition Arousal/Alertness: Awake/alert Behavior During Therapy: WFL for tasks assessed/performed Overall Cognitive Status: Within Functional Limits for tasks assessed                                 General Comments: Pt visibly  appears confused by questions occasionally during session but appropriate throughout otherwise and answers appropriately   General Comments  On 2L at start, sitting EOB O2 90% on 2L, HR 79. In standing O2 93%, HR 87. Ambulated to/from bathroom for toileting task on room air with O2 sats 89% on room air, HR 88. Pt denies SOB but at times visibly appears SOB    Exercises Other Exercises Other Exercises: Functional transfer training, falls prevention, RW mgt during ADL; toileting in bathroom   Shoulder Instructions      Home Living Family/patient expects to be discharged to:: Private residence Living Arrangements: Children (daughter) Available Help at Discharge: Family;Available 24 hours/day Type of Home: House Home Access: Stairs to enter (has a ramped entrance but not currently functional/safe) Entrance Stairs-Number of Steps: 8 Entrance Stairs-Rails: Can reach both;Right;Left Home Layout: One level     Bathroom Shower/Tub: Producer, television/film/video: Standard     Home Equipment: Bedside commode;Walker - 2 wheels;Other (comment)   Additional Comments: rollator      Prior Functioning/Environment Level of Independence: Needs assistance  Gait / Transfers Assistance Needed: pt ambulating with rollator (does not like to use 2WW), endorses 8+ falls in past 8 months; just completed HHPT approx 2 weeks prior to this admission ADL's / Homemaking Assistance Needed: Pt indep with basic ADL, daughters assist with medication mgt, transportation, meal prep, and housekeeping tasks            OT Problem List: Decreased strength;Decreased activity tolerance;Impaired balance (sitting and/or standing);Decreased knowledge of use of DME or AE      OT Treatment/Interventions: Self-care/ADL training;Therapeutic exercise;Therapeutic activities;DME and/or AE instruction;Energy conservation;Patient/family education;Balance training    OT Goals(Current goals can be found in the care plan  section) Acute Rehab OT Goals Patient Stated Goal: go home OT Goal Formulation: With patient/family Time For Goal Achievement: 12/13/19 Potential to Achieve Goals: Good ADL Goals Pt Will Perform Lower Body Dressing: with supervision;sit to/from stand Pt Will Transfer to Toilet: with supervision;ambulating (elevated commode, LRAD for amb) Additional ADL Goal #1: Pt will verbalize plan to implement at least 2 learned falls prevention strategies to maximize safety and minimize fall risk  OT Frequency: Min 1X/week   Barriers to D/C:            Co-evaluation              AM-PAC OT "6 Clicks" Daily Activity     Outcome Measure Help from another person eating meals?: None Help from another person taking care of personal grooming?: None Help from another person toileting, which includes using toliet, bedpan, or urinal?: A Little Help from another person bathing (including washing, rinsing, drying)?: A Little Help from another person to put on and taking off regular upper body clothing?: None Help from another person to put on and taking off regular lower body clothing?: A Little 6 Click Score: 21   End of Session Equipment Utilized During Treatment: Gait  belt;Rolling walker Nurse Communication: Other (comment) (nurse tech notified of O2 sats during toileting, loose BM)  Activity Tolerance: Patient tolerated treatment well Patient left: in bed;with call bell/phone within reach;with bed alarm set;with SCD's reapplied  OT Visit Diagnosis: Other abnormalities of gait and mobility (R26.89);Muscle weakness (generalized) (M62.81)                Time: 6269-4854 OT Time Calculation (min): 31 min Charges:  OT General Charges $OT Visit: 1 Visit OT Evaluation $OT Eval Moderate Complexity: 1 Mod OT Treatments $Self Care/Home Management : 23-37 mins  Richrd Prime, MPH, MS, OTR/L ascom [phone] 11/29/19, 11:49 AM

## 2019-11-29 NOTE — Progress Notes (Signed)
Inpatient Diabetes Program Recommendations  AACE/ADA: New Consensus Statement on Inpatient Glycemic Control (2015)  Target Ranges:  Prepandial:   less than 140 mg/dL      Peak postprandial:   less than 180 mg/dL (1-2 hours)      Critically ill patients:  140 - 180 mg/dL   Lab Results  Component Value Date   GLUCAP 223 (H) 11/29/2019   HGBA1C 7.3 (A) 10/12/2019    Review of Glycemic Control Results for Amanda Benson, Amanda Benson (MRN [id number]) as of 11/29/2019 10:59  Ref. Range 11/28/2019 07:38 11/28/2019 11:45 11/28/2019 13:12 11/28/2019 16:41 11/28/2019 21:58 11/29/2019 08:01  Glucose-Capillary Latest Ref Range: 70 - 99 mg/dL 355 (H) 974 (H) 163 (H) 241 (H) 187 (H) 223 (H)   Diabetes history:DM2 Outpatient Diabetes medications:Glipizide 10 mg daily, Januvia 50 mg daily, Metformin 1000 mg BID Current orders for Inpatient glycemic control:Levemir 10 units daily, Novolog 0-15 units TID with meals, Novolog 0-5 units QHS  Inpatient Diabetes Program Recommendations:  Insulin-Post prandial glucose is consistently elevated.Please consider ordering Novolog 3 units TID with meals for meal coverage if patient eats at least 50% of meals.  Thank you, Amanda Benson. Amanda Strada, RN, MSN, CDE  Diabetes Coordinator Inpatient Glycemic Control Team Team Pager [phone] (8am-5pm) 11/29/2019 11:00 AM'

## 2019-11-29 NOTE — Evaluation (Signed)
Physical Therapy Evaluation Patient Details Name: Amanda Benson MRN: [id number] DOB: Jun 04, 1935 Today's Date: 11/29/2019   History of Present Illness  84yo female admitted for diabetes ketoacidosis in the setting of sepsis due to urinary tract infection. PMHx includes depression, anxiety, HTN, DM2.  Clinical Impression  Pt initially hesitant to do a lot but acknowledges she has not done much in the last week and needs to increase her activity.  She ultimately did better than she expected and was able to ambulate into the hallway >50 ft with slow but steady gait using walker.  Pt struggled with rising to standing and needed assist even from elevated height, daughter present and reports she is slow and clearly weaker than baseline.  Pt should be safe to go home with assist from daughter but will need HHPT to get back to PLOF.     Follow Up Recommendations Home health PT;Supervision - Intermittent    Equipment Recommendations  None recommended by PT    Recommendations for Other Services       Precautions / Restrictions Precautions Precautions: Fall Restrictions Weight Bearing Restrictions: No      Mobility  Bed Mobility Overal bed mobility: Needs Assistance Bed Mobility: Supine to Sit       Sit to supine: Min guard   General bed mobility comments: additional time/effort to perform  Transfers Overall transfer level: Needs assistance Equipment used: Rolling walker (2 wheeled) Transfers: Sit to/from Stand Sit to Stand: Min assist;Mod assist         General transfer comment: Pt needed mod assist with rising to standing from from commode, min assist with bed elevated a few inches  Ambulation/Gait Ambulation/Gait assistance: Min guard Gait Distance (Feet): 55 Feet Assistive device: Rolling walker (2 wheeled)       General Gait Details: Pt was able to ambulate into hallway with slow but steady gait.  Needed some extra cuing to stay in walker and maintain cadence.  On 2 L  t/o the effort with sats staying generally in the mid 90s.  Pt fatigued with the effort but no excessive shortness of breath.  Stairs            Wheelchair Mobility    Modified Rankin (Stroke Patients Only)       Balance Overall balance assessment: Needs assistance Sitting-balance support: No upper extremity supported;Feet supported Sitting balance-Leahy Scale: Fair     Standing balance support: Single extremity supported;Bilateral upper extremity supported;During functional activity Standing balance-Leahy Scale: Fair Standing balance comment: Pt clearly reliant on the walker, some unsteadiness with no overt LOBs                             Pertinent Vitals/Pain Pain Assessment: No/denies pain    Home Living Family/patient expects to be discharged to:: Private residence Living Arrangements: Children Available Help at Discharge: Family;Available 24 hours/day Type of Home: House Home Access: Stairs to enter (has unsafe ramp)   Entrance Stairs-Number of Steps: 8 Home Layout: One level Home Equipment: Bedside commode;Walker - 2 wheels;Walker - 4 wheels      Prior Function Level of Independence: Needs assistance   Gait / Transfers Assistance Needed: pt ambulating with rollator (does not like to use 2WW), endorses 8+ falls in past 8 months; just completed HHPT approx 2 weeks prior to this admission  ADL's / Homemaking Assistance Needed: Pt indep with basic ADL, daughters assist with medication mgt, transportation, meal prep, and housekeeping tasks  Hand Dominance   Dominant Hand: Left    Extremity/Trunk Assessment   Upper Extremity Assessment Upper Extremity Assessment: Generalized weakness    Lower Extremity Assessment Lower Extremity Assessment: Generalized weakness       Communication   Communication: HOH  Cognition Arousal/Alertness: Awake/alert Behavior During Therapy: WFL for tasks assessed/performed Overall Cognitive Status:  Within Functional Limits for tasks assessed                                        General Comments      Exercises     Assessment/Plan    PT Assessment Patient needs continued PT services  PT Problem List Decreased strength;Decreased range of motion;Decreased balance;Decreased activity tolerance;Decreased mobility;Decreased coordination;Decreased cognition;Decreased knowledge of use of DME;Decreased safety awareness;Cardiopulmonary status limiting activity       PT Treatment Interventions Gait training;DME instruction;Stair training;Functional mobility training;Therapeutic activities;Therapeutic exercise;Balance training;Neuromuscular re-education;Cognitive remediation;Patient/family education    PT Goals (Current goals can be found in the Care Plan section)  Acute Rehab PT Goals Patient Stated Goal: go home PT Goal Formulation: With patient Time For Goal Achievement: 12/13/19 Potential to Achieve Goals: Good    Frequency Min 2X/week   Barriers to discharge        Co-evaluation               AM-PAC PT "6 Clicks" Mobility  Outcome Measure Help needed turning from your back to your side while in a flat bed without using bedrails?: A Little Help needed moving from lying on your back to sitting on the side of a flat bed without using bedrails?: A Little Help needed moving to and from a bed to a chair (including a wheelchair)?: A Little Help needed standing up from a chair using your arms (e.g., wheelchair or bedside chair)?: A Lot Help needed to walk in hospital room?: A Little Help needed climbing 3-5 steps with a railing? : A Lot 6 Click Score: 16    End of Session Equipment Utilized During Treatment: Gait belt Activity Tolerance: Patient tolerated treatment well Patient left: with call bell/phone within reach;with chair alarm set;with family/visitor present   PT Visit Diagnosis: Muscle weakness (generalized) (M62.81);Difficulty in walking, not  elsewhere classified (R26.2);Unsteadiness on feet (R26.81)    Time: 4158-3094 PT Time Calculation (min) (ACUTE ONLY): 24 min   Charges:   PT Evaluation $PT Eval Low Complexity: 1 Low PT Treatments $Gait Training: 8-22 mins        Malachi Pro, DPT 11/29/2019, 4:08 PM

## 2019-11-30 ENCOUNTER — Encounter: Payer: Self-pay | Admitting: Family Medicine

## 2019-11-30 DIAGNOSIS — N1832 Chronic kidney disease, stage 3b: Secondary | ICD-10-CM

## 2019-11-30 LAB — GLUCOSE, CAPILLARY
Glucose-Capillary: 121 mg/dL — ABNORMAL HIGH (ref 70–99)
Glucose-Capillary: 275 mg/dL — ABNORMAL HIGH (ref 70–99)

## 2019-11-30 LAB — CBC WITH DIFFERENTIAL/PLATELET
Abs Immature Granulocytes: 0.25 10*3/uL — ABNORMAL HIGH (ref 0.00–0.07)
Basophils Absolute: 0 10*3/uL (ref 0.0–0.1)
Basophils Relative: 0 %
Eosinophils Absolute: 0.4 10*3/uL (ref 0.0–0.5)
Eosinophils Relative: 2 %
HCT: 27.2 % — ABNORMAL LOW (ref 36.0–46.0)
Hemoglobin: 8.8 g/dL — ABNORMAL LOW (ref 12.0–15.0)
Immature Granulocytes: 1 %
Lymphocytes Relative: 7 %
Lymphs Abs: 1.4 10*3/uL (ref 0.7–4.0)
MCH: 28.7 pg (ref 26.0–34.0)
MCHC: 32.4 g/dL (ref 30.0–36.0)
MCV: 88.6 fL (ref 80.0–100.0)
Monocytes Absolute: 1.1 10*3/uL — ABNORMAL HIGH (ref 0.1–1.0)
Monocytes Relative: 6 %
Neutro Abs: 16.5 10*3/uL — ABNORMAL HIGH (ref 1.7–7.7)
Neutrophils Relative %: 84 %
Platelets: 533 10*3/uL — ABNORMAL HIGH (ref 150–400)
RBC: 3.07 MIL/uL — ABNORMAL LOW (ref 3.87–5.11)
RDW: 13.8 % (ref 11.5–15.5)
WBC: 19.7 10*3/uL — ABNORMAL HIGH (ref 4.0–10.5)
nRBC: 0 % (ref 0.0–0.2)

## 2019-11-30 LAB — BASIC METABOLIC PANEL
Anion gap: 11 (ref 5–15)
BUN: 26 mg/dL — ABNORMAL HIGH (ref 8–23)
CO2: 22 mmol/L (ref 22–32)
Calcium: 7.7 mg/dL — ABNORMAL LOW (ref 8.9–10.3)
Chloride: 108 mmol/L (ref 98–111)
Creatinine, Ser: 1.97 mg/dL — ABNORMAL HIGH (ref 0.44–1.00)
GFR calc Af Amer: 27 mL/min — ABNORMAL LOW (ref >60)
GFR calc non Af Amer: 23 mL/min — ABNORMAL LOW (ref >60)
Glucose, Bld: 135 mg/dL — ABNORMAL HIGH (ref 70–99)
Potassium: 3.9 mmol/L (ref 3.5–5.1)
Sodium: 141 mmol/L (ref 135–145)

## 2019-11-30 MED ORDER — PANTOPRAZOLE SODIUM 40 MG PO TBEC: 40.0000 mg | DELAYED_RELEASE_TABLET | Freq: Every day | ORAL | 0 refills | Status: DC

## 2019-11-30 NOTE — Discharge Summary (Signed)
Physician Discharge Summary  Amanda Benson TDH:[id number] DOB: [birth year] DOA: 11/23/2019  PCP: Judy Pimple, MD  Admit date: 11/23/2019 Discharge date: 11/30/2019  Admitted From: Home  Disposition:  Home   Recommendations for Outpatient Follow-up and new medication changes:  1. Follow up with Dr. Milinda Antis in 7 days.  2. Patient completed antibiotic therapy during her hospitalization.  3. Follow up renal function in 7 days.   Home Health: yes  Equipment/Devices: na     Discharge Condition: stable  CODE STATUS: dnr   Diet recommendation: heart healthy   Brief/Interim Summary: Patient admitted to the hospital with working diagnosis of diabetes ketoacidosis in the setting of sepsis (severe sepsis/ end organ failure AKI) due to urinary tract infection, present on admission.  84 year old female with significant past medical history for type 2 diabetes mellitus, hypertension and dyslipidemia. Patient reported 2-week history of nausea, vomiting and diarrhea. GI symptoms associated with abdominal pain and dysuria.On her initialphysical examination herblood pressure 155/49, heart rate 76, oxygen saturation 90%, she had dry mucous membranes lungs clear to auscultation bilaterally, heart S1-S2, present rhythmic, soft abdomen, no lower extremity edema. Sodium 135, potassium 4.0, chloride 102, bicarb 17, glucose 315, BUN 52, creatinine 3.42, anion gap 16, white count 23.7, hemoglobin 8.4, hematocrit 35.3,platelets 661.SARS COVID-19 negative. Urine analysis more than 50 white cells, more than 50 red cells, specific gravity 1.013, 100 protein.  Patient received intravenous insulin for correction of ketoacidosis. Diagnosed with sepsis due to urinary tract infection, placed on broad-spectrum antibiotic therapy.Stool was negative for C. Difficile.   1.Uncontrolled type 2 diabetes mellitus, Hgb A1c 7.3, diabetes ketoacidosis, dyslipidemia.Patient was admitted to the medical ward, she  received insulin intravenously with improvement of anion gap and serum glucose. She was placed on basal insulin 10 units of Levemir along with insulin sliding scale with good tolerance. At discharge patient will resume oral antihyperglycemic agents. Her hemoglobin A1c 7.3.  Follow-up as an outpatient, if persistent uncontrolled glucose to consider insulin therapy.  Continue simvastatin.  2.Sepsis due to urine tract infection, present on admission, endorgan damage acute kidney injury. Patient received supportive medical therapy with intravenous fluids and broad-spectrum antibiotic therapy. Her urine culture grew less than 10,000 unit, (unspecified organism).2 sets of blood cultures remain no growth.  Patient completed antibiotic therapy during her hospitalization. Further work-up with CT of the abdomen showed thickening anterior superior bladder wall with at least infiltrate change.Ultrasound of the abdomen with no acute changes, no hydronephrosis.  Her discharge wbc is trending down to 19,7.   3.Hypertension/ peripheral vascular disease.Continue blood pressure control with losartan and hctz.   Continue with raloxifen.   4.Depression. Continue bupropion and fluoxetine.  5. AKI on CKD stage 3b/ hypokalemia. Renal function stable with serum cr at 1,8 to 1.9, it is possible that is her new baseline.  Her discharge K is up to 4,0 with serum bicarbonate at 22.   6. Uncontrolled T2DM Hgb A1c 7.3/ dyslipidemia. Fasting glucose this am is 135 mg/dl.  patient will resume oral antihyperglycemic therapy. Sitagliptin, glipizide and metformin.   Continue simvastatin with good toleration  Discharge Diagnoses:  Principal Problem:   DKA (diabetic ketoacidoses) (HCC) Active Problems:   Essential hypertension   Depression with anxiety   AKI (acute kidney injury) (HCC)   Leukocytosis    Discharge Instructions   Allergies as of 11/30/2019      Reactions   Alendronate  Sodium    REACTION: heartburn      Medication List  TAKE these medications   amLODipine 10 MG tablet Commonly known as: NORVASC TAKE 1 TABLET(10 MG) BY MOUTH DAILY   aspirin 81 MG EC tablet Take 81 mg by mouth daily.   buPROPion 300 MG 24 hr tablet Commonly known as: WELLBUTRIN XL TAKE 1 TABLET(300 MG) BY MOUTH DAILY   FLUoxetine 40 MG capsule Commonly known as: PROZAC TAKE 1 CAPSULE(40 MG) BY MOUTH DAILY   glipiZIDE 10 MG 24 hr tablet Commonly known as: GLUCOTROL XL TAKE 1 TABLET(10 MG) BY MOUTH DAILY   losartan-hydrochlorothiazide 100-25 MG tablet Commonly known as: HYZAAR TAKE 1 TABLET BY MOUTH DAILY   metFORMIN 1000 MG tablet Commonly known as: GLUCOPHAGE TAKE 1 TABLET(1000 MG) BY MOUTH TWICE DAILY WITH A MEAL   metoprolol succinate 25 MG 24 hr tablet Commonly known as: TOPROL-XL TAKE 1 TABLET(25 MG) BY MOUTH DAILY   pantoprazole 40 MG tablet Commonly known as: PROTONIX Take 1 tablet (40 mg total) by mouth daily.   raloxifene 60 MG tablet Commonly known as: EVISTA TAKE 1 TABLET(60 MG) BY MOUTH DAILY   simvastatin 20 MG tablet Commonly known as: ZOCOR TAKE 1 TABLET(20 MG) BY MOUTH DAILY   sitaGLIPtin 50 MG tablet Commonly known as: JANUVIA Take 50 mg by mouth daily.       Follow-up Information    Tower, Audrie Gallus, MD Follow up in 1 week(s).   Specialties: Family Medicine, Radiology Contact information: [address] [place] [address] [phone]              Allergies  Allergen Reactions  . Alendronate Sodium     REACTION: heartburn       Procedures/Studies: CT ABDOMEN PELVIS WO CONTRAST  Result Date: 11/23/2019 CLINICAL DATA:  Acute nonlocalized abdominal pain, generalized weakness, and decreased appetite for 2 days EXAM: CT ABDOMEN AND PELVIS WITHOUT CONTRAST TECHNIQUE: Multidetector CT imaging of the abdomen and pelvis was performed following the standard protocol without IV contrast. Sagittal and coronal MPR images  reconstructed from axial data set. No oral contrast administered. COMPARISON:  None FINDINGS: Lower chest: Lung bases emphysematous but clear Hepatobiliary: Gallbladder surgically absent.  Liver unremarkable. Pancreas: Normal appearance Spleen: Small but otherwise normal appearance Adrenals/Urinary Tract: BILATERAL adrenal gland thickening without discrete mass. Tiny nonobstructing LEFT renal calculus. Cortical scarring at anterior inferior LEFT kidney. No renal mass, hydronephrosis, or hydroureter. Thickened anterior superior bladder wall with adjacent infiltrative change, could represent cystitis though tumor not excluded with this appearance. Stomach/Bowel: Normal appendix. Large duodenal diverticulum. Moderate-sized hiatal hernia with wall thickening in the hernia sac, question artifact from underdistention, cannot exclude mass. Associated thickening of the distal esophageal wall. Remainder stomach decompressed. Diverticulosis of transverse through sigmoid colon with single diverticula noted at ascending colon. No colonic wall thickening or pericolic infiltrative changes to suggest acute diverticulitis. Remaining bowel loops unremarkable. Vascular/Lymphatic: Extensive atherosclerotic calcifications aorta, visceral arteries, iliac arteries. 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 Multiple pelvic phleboliths. No adenopathy. Reproductive: Unremarkable uterus, ovaries, and adnexa Other: No free air or free fluid. Small cyst identified within LEFT perineum at LEFT labia, question Bartholin cyst 1.8 x 1.3 cm. Musculoskeletal: Osseous demineralization with degenerative disc and facet disease changes of lower lumbar spine associated with grade 1 anterolisthesis L4-L5. IMPRESSION: Extensive colonic diverticulosis without evidence of diverticulitis. Moderate-sized hiatal hernia with wall thickening in the hernia sac, question artifact from underdistention, as well as thickening of distal  esophageal wall; consider endoscopy to exclude gastric neoplasm if clinically indicated based  on patient age and comorbidities. Tiny nonobstructing LEFT renal calculus. Thickened anterior superior bladder wall with adjacent infiltrative change, could represent cystitis though tumor not excluded with this appearance; recommend correlation with urinalysis. Small cyst within LEFT perineum at LEFT labia, question Bartholin cyst 1.8 x 1.3 cm. Aortic Atherosclerosis (ICD10-I70.0) and Emphysema (ICD10-J43.9). Electronically Signed   By: Ulyses Southward M.D.   On: 11/23/2019 08:13   DG Chest 2 View  Result Date: 11/26/2019 CLINICAL DATA:  Weakness, fatigue. EXAM: CHEST - 2 VIEW COMPARISON:  November 22, 2019. FINDINGS: The heart size and mediastinal contours are within normal limits. Both lungs are clear. The visualized skeletal structures are unremarkable. IMPRESSION: No active cardiopulmonary disease. Aortic Atherosclerosis (ICD10-I70.0). Electronically Signed   By: Lupita Raider M.D.   On: 11/26/2019 09:47   DG Chest 2 View  Result Date: 11/23/2019 CLINICAL DATA:  Weakness EXAM: CHEST - 2 VIEW COMPARISON:  None. FINDINGS: The heart size and mediastinal contours are within normal limits. Both lungs are clear. The visualized skeletal structures are unremarkable. IMPRESSION: No active cardiopulmonary disease. Electronically Signed   By: Deatra Robinson M.D.   On: 11/23/2019 00:07   US RENAL  Result Date: 11/23/2019 CLINICAL DATA:  Acute kidney injury. EXAM: RENAL / URINARY TRACT ULTRASOUND COMPLETE COMPARISON:  CT same day FINDINGS: Right Kidney: Renal measurements: 12.2 x 5.1 x 5.3 cm = volume: 183 mL. Echogenicity within normal limits. No mass or hydronephrosis visualized. Left Kidney: Renal measurements: 10.9 x 5.3 x 4.5 cm = volume: 135 mL. Echogenicity within normal limits. No mass or hydronephrosis visualized. Mild volume loss lower pole. Bladder: Appears normal for degree of bladder distention. Other: None.  IMPRESSION: Study within normal limits on the right. Normal size and echogenicity. No hydronephrosis. On the left, there is some scarring/volume loss at the lower pole but the kidney appears otherwise normal sonographically. Tiny nonobstructing stones seen by CT not identified by ultrasound. Electronically Signed   By: Paulina Fusi M.D.   On: 11/23/2019 15:06        Subjective: Patient is feeling better, no nausea or vomiting, no chest pain or dyspnea.   Discharge Exam: Vitals:   11/30/19 0823 11/30/19 0824  BP: (!) 155/65   Pulse: 80 78  Resp: 16   Temp: 99.4 F (37.4 C)   SpO2: (!) 89% 90%   Vitals:   11/29/19 2327 11/30/19 0459 11/30/19 0823 11/30/19 0824  BP: (!) 140/56 (!) 144/57 (!) 155/65   Pulse: 71 81 80 78  Resp: 17 16 16    Temp: 99.1 F (37.3 C) 98.8 F (37.1 C) 99.4 F (37.4 C)   TempSrc: Oral Oral Oral   SpO2: 92% (!) 84% (!) 89% 90%  Weight:      Height:        General: Not in pain or dyspnea.  Neurology: Awake and alert, non focal  E ENT: mild pallor, no icterus, oral mucosa moist Cardiovascular: No JVD. S1-S2 present, rhythmic, no gallops, rubs, or murmurs. No lower extremity edema. Pulmonary: positive breath sounds bilaterally, adequate air movement, no wheezing, rhonchi or rales. Gastrointestinal. Abdomen soft and non tender Skin. No rashes Musculoskeletal: no joint deformities   The results of significant diagnostics from this hospitalization (including imaging, microbiology, ancillary and laboratory) are listed below for reference.     Microbiology: Recent Results (from the past 240 hour(s))  SARS Coronavirus 2 by RT PCR (hospital order, performed in [facility] hospital lab) Nasopharyngeal Nasopharyngeal Swab     Status:  None   Collection Time: 11/23/19 12:00 AM   Specimen: Nasopharyngeal Swab  Result Value Ref Range Status   SARS Coronavirus 2 NEGATIVE NEGATIVE Final    Comment: (NOTE) SARS-CoV-2 target nucleic acids are NOT  DETECTED.  The SARS-CoV-2 RNA is generally detectable in upper and lower respiratory specimens during the acute phase of infection. The lowest concentration of SARS-CoV-2 viral copies this assay can detect is 250 copies / mL. A negative result does not preclude SARS-CoV-2 infection and should not be used as the sole basis for treatment or other patient management decisions.  A negative result may occur with improper specimen collection / handling, submission of specimen other than nasopharyngeal swab, presence of viral mutation(s) within the areas targeted by this assay, and inadequate number of viral copies (<250 copies / mL). A negative result must be combined with clinical observations, patient history, and epidemiological information.  Fact Sheet for Patients:   BoilerBrush.com.cy  Fact Sheet for Healthcare Providers: https://pope.com/  This test is not yet approved or  cleared by the [place] FDA and has been authorized for detection and/or diagnosis of SARS-CoV-2 by FDA under an Emergency Use Authorization (EUA).  This EUA will remain in effect (meaning this test can be used) for the duration of the COVID-19 declaration under Section 564(b)(1) of the Act, 21 U.S.C. section 360bbb-3(b)(1), unless the authorization is terminated or revoked sooner.  Performed at [facility], [address] [place], [place] [address]   Blood culture (routine x 2)     Status: None   Collection Time: 11/23/19  6:33 AM   Specimen: BLOOD  Result Value Ref Range Status   Specimen Description BLOOD LEFT ANTECUBITAL  Final   Special Requests   Final    BOTTLES DRAWN AEROBIC AND ANAEROBIC Blood Culture adequate volume   Culture   Final    NO GROWTH 5 DAYS Performed at [facility], [address], [place], [place] [address]    Report Status 11/28/2019 FINAL  Final  Blood culture (routine x 2)     Status: None   Collection  Time: 11/23/19  6:55 AM   Specimen: BLOOD  Result Value Ref Range Status   Specimen Description BLOOD RIGHT ANTECUBITAL  Final   Special Requests   Final    BOTTLES DRAWN AEROBIC AND ANAEROBIC Blood Culture adequate volume   Culture   Final    NO GROWTH 5 DAYS Performed at [facility], [address], [place], [place] [address]    Report Status 11/28/2019 FINAL  Final  Urine culture     Status: Abnormal   Collection Time: 11/23/19 12:24 PM   Specimen: Urine, Random  Result Value Ref Range Status   Specimen Description   Final    URINE, RANDOM Performed at [facility], [address] [place], [place] [address]    Special Requests   Final    NONE Performed at [facility], [address] [place], [place] [address]    Culture (A)  Final    <10,000 COLONIES/mL INSIGNIFICANT GROWTH Performed at [facility], [address] [place], [place] [address]    Report Status 11/25/2019 FINAL  Final  C Difficile Quick Screen w PCR reflex     Status: None   Collection Time: 11/24/19  6:26 AM   Specimen: STOOL  Result Value Ref Range Status   C Diff antigen NEGATIVE NEGATIVE Final   C Diff toxin NEGATIVE NEGATIVE Final   C  Diff interpretation No C. difficile detected.  Final    Comment: Performed at [facility], [address] [place], [place] [address]  CULTURE, BLOOD (ROUTINE X 2) w Reflex to ID Panel     Status: None (Preliminary result)   Collection Time: 11/26/19 12:12 PM   Specimen: BLOOD  Result Value Ref Range Status   Specimen Description BLOOD RIGHT [facility]  Final   Special Requests   Final    BOTTLES DRAWN AEROBIC AND ANAEROBIC Blood Culture adequate volume   Culture   Final    NO GROWTH 4 DAYS Performed at [facility], [address] [place], [place] [address]    Report Status PENDING  Incomplete  CULTURE, BLOOD (ROUTINE X 2) w Reflex to ID Panel     Status: None (Preliminary result)   Collection Time: 11/26/19  12:17 PM   Specimen: BLOOD RIGHT HAND  Result Value Ref Range Status   Specimen Description BLOOD RIGHT HAND  Final   Special Requests   Final    BOTTLES DRAWN AEROBIC AND ANAEROBIC Blood Culture adequate volume   Culture   Final    NO GROWTH 4 DAYS Performed at [facility], [address], [place], [place] [address]    Report Status PENDING  Incomplete     Labs: BNP (last 3 results) No results for input(s): BNP in the last 8760 hours. Basic Metabolic Panel: Recent Labs  Lab 11/26/19 0409 11/27/19 0513 11/28/19 0608 11/29/19 0430 11/30/19 0626  NA 138 142 142 139 141  K 4.1 3.9 3.6 3.1* 3.9  CL 105 112* 110 106 108  CO2 23 21* 22 23 22   GLUCOSE 184* 186* 179* 221* 135*  BUN 43* 41* 29* 26* 26*  CREATININE 2.21* 2.07* 1.83* 1.82* 1.97*  CALCIUM 8.2* 7.2* 7.7* 7.4* 7.7*   Liver Function Tests: No results for input(s): AST, ALT, ALKPHOS, BILITOT, PROT, ALBUMIN in the last 168 hours. No results for input(s): LIPASE, AMYLASE in the last 168 hours. No results for input(s): AMMONIA in the last 168 hours. CBC: Recent Labs  Lab 11/26/19 0409 11/27/19 0513 11/28/19 0608 11/29/19 0430 11/30/19 0626  WBC 17.2* 14.1* 17.8* 23.1* 19.7*  NEUTROABS  --   --   --   --  16.5*  HGB 10.6* 8.5* 9.3* 9.4* 8.8*  HCT 31.6* 26.4* 27.4* 28.1* 27.2*  MCV 86.3 89.5 86.2 85.7 88.6  PLT 584* 498* 527* 566* 533*   Cardiac Enzymes: No results for input(s): CKTOTAL, CKMB, CKMBINDEX, TROPONINI in the last 168 hours. BNP: Invalid input(s): POCBNP CBG: Recent Labs  Lab 11/29/19 0801 11/29/19 1209 11/29/19 1635 11/29/19 2117 11/30/19 0819  GLUCAP 223* 191* 133* 146* 121*   D-Dimer No results for input(s): DDIMER in the last 72 hours. Hgb A1c No results for input(s): HGBA1C in the last 72 hours. Lipid Profile No results for input(s): CHOL, HDL, LDLCALC, TRIG, CHOLHDL, LDLDIRECT in the last 72 hours. Thyroid function studies No results for input(s): TSH, T4TOTAL, T3FREE,  THYROIDAB in the last 72 hours.  Invalid input(s): FREET3 Anemia work up No results for input(s): VITAMINB12, FOLATE, FERRITIN, TIBC, IRON, RETICCTPCT in the last 72 hours. Urinalysis    Component Value Date/Time   COLORURINE RED (A) 11/26/2019 1255   APPEARANCEUR TURBID (A) 11/26/2019 1255   LABSPEC 1.011 11/26/2019 1255   PHURINE  11/26/2019 1255    TEST NOT REPORTED DUE TO COLOR INTERFERENCE OF URINE PIGMENT   GLUCOSEU (A) 11/26/2019 1255    TEST NOT  REPORTED DUE TO COLOR INTERFERENCE OF URINE PIGMENT   HGBUR (A) 11/26/2019 1255    TEST NOT REPORTED DUE TO COLOR INTERFERENCE OF URINE PIGMENT   HGBUR trace-intact 05/01/2008 1218   BILIRUBINUR (A) 11/26/2019 1255    TEST NOT REPORTED DUE TO COLOR INTERFERENCE OF URINE PIGMENT   BILIRUBINUR neg 10/12/2019 1215   KETONESUR (A) 11/26/2019 1255    TEST NOT REPORTED DUE TO COLOR INTERFERENCE OF URINE PIGMENT   PROTEINUR (A) 11/26/2019 1255    TEST NOT REPORTED DUE TO COLOR INTERFERENCE OF URINE PIGMENT   UROBILINOGEN 0.2 10/12/2019 1215   UROBILINOGEN 0.2 05/01/2008 1218   NITRITE (A) 11/26/2019 1255    TEST NOT REPORTED DUE TO COLOR INTERFERENCE OF URINE PIGMENT   LEUKOCYTESUR (A) 11/26/2019 1255    TEST NOT REPORTED DUE TO COLOR INTERFERENCE OF URINE PIGMENT   Sepsis Labs Invalid input(s): PROCALCITONIN,  WBC,  LACTICIDVEN Microbiology Recent Results (from the past 240 hour(s))  SARS Coronavirus 2 by RT PCR (hospital order, performed in [facility] hospital lab) Nasopharyngeal Nasopharyngeal Swab     Status: None   Collection Time: 11/23/19 12:00 AM   Specimen: Nasopharyngeal Swab  Result Value Ref Range Status   SARS Coronavirus 2 NEGATIVE NEGATIVE Final    Comment: (NOTE) SARS-CoV-2 target nucleic acids are NOT DETECTED.  The SARS-CoV-2 RNA is generally detectable in upper and lower respiratory specimens during the acute phase of infection. The lowest concentration of SARS-CoV-2 viral copies this assay can detect is  250 copies / mL. A negative result does not preclude SARS-CoV-2 infection and should not be used as the sole basis for treatment or other patient management decisions.  A negative result may occur with improper specimen collection / handling, submission of specimen other than nasopharyngeal swab, presence of viral mutation(s) within the areas targeted by this assay, and inadequate number of viral copies (<250 copies / mL). A negative result must be combined with clinical observations, patient history, and epidemiological information.  Fact Sheet for Patients:   BoilerBrush.com.cyhttps://www.fda.gov/media/136312/download  Fact Sheet for Healthcare Providers: https://pope.com/https://www.fda.gov/media/136313/download  This test is not yet approved or  cleared by the [place] FDA and has been authorized for detection and/or diagnosis of SARS-CoV-2 by FDA under an Emergency Use Authorization (EUA).  This EUA will remain in effect (meaning this test can be used) for the duration of the COVID-19 declaration under Section 564(b)(1) of the Act, 21 U.S.C. section 360bbb-3(b)(1), unless the authorization is terminated or revoked sooner.  Performed at [facility], [address] [place], [place] [address]   Blood culture (routine x 2)     Status: None   Collection Time: 11/23/19  6:33 AM   Specimen: BLOOD  Result Value Ref Range Status   Specimen Description BLOOD LEFT ANTECUBITAL  Final   Special Requests   Final    BOTTLES DRAWN AEROBIC AND ANAEROBIC Blood Culture adequate volume   Culture   Final    NO GROWTH 5 DAYS Performed at [facility], [address] [place], [place] [address]    Report Status 11/28/2019 FINAL  Final  Blood culture (routine x 2)     Status: None   Collection Time: 11/23/19  6:55 AM   Specimen: BLOOD  Result Value Ref Range Status   Specimen Description BLOOD RIGHT ANTECUBITAL  Final   Special Requests   Final    BOTTLES DRAWN AEROBIC AND ANAEROBIC Blood Culture  adequate volume   Culture   Final  NO GROWTH 5 DAYS Performed at [facility], [address] [place]., [place], [place] [address]    Report Status 11/28/2019 FINAL  Final  Urine culture     Status: Abnormal   Collection Time: 11/23/19 12:24 PM   Specimen: Urine, Random  Result Value Ref Range Status   Specimen Description   Final    URINE, RANDOM Performed at [facility], [address], [place], [place] [address]    Special Requests   Final    NONE Performed at [facility], [address] [place], [place] [address]    Culture (A)  Final    <10,000 COLONIES/mL INSIGNIFICANT GROWTH Performed at [facility], [address], [place], [place] [address]    Report Status 11/25/2019 FINAL  Final  C Difficile Quick Screen w PCR reflex     Status: None   Collection Time: 11/24/19  6:26 AM   Specimen: STOOL  Result Value Ref Range Status   C Diff antigen NEGATIVE NEGATIVE Final   C Diff toxin NEGATIVE NEGATIVE Final   C Diff interpretation No C. difficile detected.  Final    Comment: Performed at [facility], [address] [place], [place] [address]  CULTURE, BLOOD (ROUTINE X 2) w Reflex to ID Panel     Status: None (Preliminary result)   Collection Time: 11/26/19 12:12 PM   Specimen: BLOOD  Result Value Ref Range Status   Specimen Description BLOOD RIGHT [facility]  Final   Special Requests   Final    BOTTLES DRAWN AEROBIC AND ANAEROBIC Blood Culture adequate volume   Culture   Final    NO GROWTH 4 DAYS Performed at [facility], [address] [place], [place] [address]    Report Status PENDING  Incomplete  CULTURE, BLOOD (ROUTINE X 2) w Reflex to ID Panel     Status: None (Preliminary result)   Collection Time: 11/26/19 12:17 PM   Specimen: BLOOD RIGHT HAND  Result Value Ref Range Status   Specimen Description BLOOD RIGHT HAND  Final   Special Requests   Final    BOTTLES DRAWN AEROBIC AND ANAEROBIC Blood Culture adequate  volume   Culture   Final    NO GROWTH 4 DAYS Performed at [facility], [address], [place], [place] [address]    Report Status PENDING  Incomplete     Time coordinating discharge: 45 minutes  SIGNED:   Coralie Keens, MD  [facility] 11/30/2019, 8:59 AM

## 2019-11-30 NOTE — TOC Progression Note (Signed)
Transition of Care [facility]) - Progression Note    Patient Details  Name: Amanda Benson MRN: [id number] Date of Birth: Jul 27, 1935  Transition of Care [facility]) CM/SW Contact  Su Hilt, RN Phone Number: 11/30/2019, 10:34 AM  Clinical Narrative:   Met with the patient to discuss DC plan and needs She lives at home with her adult children and they provide assistance, she has a Rolator, RW and BSC at home and does not need additional DME, she agrees to [facility] PT and [facility] has accepted her as a patient No additional needs     Expected Discharge Plan: [place] Barriers to Discharge: Barriers Resolved  Expected Discharge Plan and Services Expected Discharge Plan: [place]   Discharge Planning Services: CM Consult   Living arrangements for the past 2 months: Single Family Home Expected Discharge Date: 11/30/19               DME Arranged: N/A         HH Arranged: PT HH Agency: [facility] [place] Date [place] Agency Contacted: 11/30/19 Time [place]: 1033 Representative spoke with at [place]: [place] ([place]) Interventions    Readmission Risk Interventions No flowsheet data found.

## 2019-12-01 LAB — CULTURE, BLOOD (ROUTINE X 2)
Culture: NO GROWTH
Culture: NO GROWTH
Special Requests: ADEQUATE
Special Requests: ADEQUATE

## 2019-12-02 DIAGNOSIS — Z9181 History of falling: Secondary | ICD-10-CM | POA: Diagnosis not present

## 2019-12-02 DIAGNOSIS — E785 Hyperlipidemia, unspecified: Secondary | ICD-10-CM | POA: Diagnosis not present

## 2019-12-02 DIAGNOSIS — N1832 Chronic kidney disease, stage 3b: Secondary | ICD-10-CM | POA: Diagnosis not present

## 2019-12-02 DIAGNOSIS — I129 Hypertensive chronic kidney disease with stage 1 through stage 4 chronic kidney disease, or unspecified chronic kidney disease: Secondary | ICD-10-CM | POA: Diagnosis not present

## 2019-12-02 DIAGNOSIS — J439 Emphysema, unspecified: Secondary | ICD-10-CM | POA: Diagnosis not present

## 2019-12-02 DIAGNOSIS — R32 Unspecified urinary incontinence: Secondary | ICD-10-CM | POA: Diagnosis not present

## 2019-12-02 DIAGNOSIS — E1169 Type 2 diabetes mellitus with other specified complication: Secondary | ICD-10-CM | POA: Diagnosis not present

## 2019-12-02 DIAGNOSIS — E1151 Type 2 diabetes mellitus with diabetic peripheral angiopathy without gangrene: Secondary | ICD-10-CM | POA: Diagnosis not present

## 2019-12-02 DIAGNOSIS — K5790 Diverticulosis of intestine, part unspecified, without perforation or abscess without bleeding: Secondary | ICD-10-CM | POA: Diagnosis not present

## 2019-12-02 DIAGNOSIS — E1122 Type 2 diabetes mellitus with diabetic chronic kidney disease: Secondary | ICD-10-CM | POA: Diagnosis not present

## 2019-12-02 DIAGNOSIS — E1165 Type 2 diabetes mellitus with hyperglycemia: Secondary | ICD-10-CM | POA: Diagnosis not present

## 2019-12-02 DIAGNOSIS — F419 Anxiety disorder, unspecified: Secondary | ICD-10-CM | POA: Diagnosis not present

## 2019-12-02 DIAGNOSIS — I700 Atherosclerosis of aorta: Secondary | ICD-10-CM | POA: Diagnosis not present

## 2019-12-02 DIAGNOSIS — M810 Age-related osteoporosis without current pathological fracture: Secondary | ICD-10-CM | POA: Diagnosis not present

## 2019-12-02 DIAGNOSIS — F329 Major depressive disorder, single episode, unspecified: Secondary | ICD-10-CM | POA: Diagnosis not present

## 2019-12-02 DIAGNOSIS — Z87440 Personal history of urinary (tract) infections: Secondary | ICD-10-CM | POA: Diagnosis not present

## 2019-12-02 DIAGNOSIS — E039 Hypothyroidism, unspecified: Secondary | ICD-10-CM | POA: Diagnosis not present

## 2019-12-02 DIAGNOSIS — M1990 Unspecified osteoarthritis, unspecified site: Secondary | ICD-10-CM | POA: Diagnosis not present

## 2019-12-02 DIAGNOSIS — E1110 Type 2 diabetes mellitus with ketoacidosis without coma: Secondary | ICD-10-CM | POA: Diagnosis not present

## 2019-12-02 DIAGNOSIS — Z79810 Long term (current) use of selective estrogen receptor modulators (SERMs): Secondary | ICD-10-CM | POA: Diagnosis not present

## 2019-12-02 DIAGNOSIS — L89151 Pressure ulcer of sacral region, stage 1: Secondary | ICD-10-CM | POA: Diagnosis not present

## 2019-12-02 DIAGNOSIS — M5136 Other intervertebral disc degeneration, lumbar region: Secondary | ICD-10-CM | POA: Diagnosis not present

## 2019-12-02 DIAGNOSIS — H9190 Unspecified hearing loss, unspecified ear: Secondary | ICD-10-CM | POA: Diagnosis not present

## 2019-12-02 DIAGNOSIS — Z7982 Long term (current) use of aspirin: Secondary | ICD-10-CM | POA: Diagnosis not present

## 2019-12-03 ENCOUNTER — Telehealth: Payer: Self-pay

## 2019-12-03 NOTE — Telephone Encounter (Signed)
Left message on voicemail to return my call- need to complete TCM. Follow up visit scheduled 12/07/2019 @ 11:30 am with Dr. Milinda Antis.

## 2019-12-04 NOTE — Telephone Encounter (Signed)
Final attempt- Called patient to complete TCM and a child answered the phone and hung up times 2.

## 2019-12-06 DIAGNOSIS — L89151 Pressure ulcer of sacral region, stage 1: Secondary | ICD-10-CM | POA: Diagnosis not present

## 2019-12-06 DIAGNOSIS — Z79810 Long term (current) use of selective estrogen receptor modulators (SERMs): Secondary | ICD-10-CM | POA: Diagnosis not present

## 2019-12-06 DIAGNOSIS — F329 Major depressive disorder, single episode, unspecified: Secondary | ICD-10-CM | POA: Diagnosis not present

## 2019-12-06 DIAGNOSIS — H9190 Unspecified hearing loss, unspecified ear: Secondary | ICD-10-CM | POA: Diagnosis not present

## 2019-12-06 DIAGNOSIS — M1990 Unspecified osteoarthritis, unspecified site: Secondary | ICD-10-CM | POA: Diagnosis not present

## 2019-12-06 DIAGNOSIS — Z7982 Long term (current) use of aspirin: Secondary | ICD-10-CM | POA: Diagnosis not present

## 2019-12-06 DIAGNOSIS — I129 Hypertensive chronic kidney disease with stage 1 through stage 4 chronic kidney disease, or unspecified chronic kidney disease: Secondary | ICD-10-CM | POA: Diagnosis not present

## 2019-12-06 DIAGNOSIS — N1832 Chronic kidney disease, stage 3b: Secondary | ICD-10-CM | POA: Diagnosis not present

## 2019-12-06 DIAGNOSIS — J439 Emphysema, unspecified: Secondary | ICD-10-CM | POA: Diagnosis not present

## 2019-12-06 DIAGNOSIS — Z87440 Personal history of urinary (tract) infections: Secondary | ICD-10-CM | POA: Diagnosis not present

## 2019-12-06 DIAGNOSIS — I700 Atherosclerosis of aorta: Secondary | ICD-10-CM | POA: Diagnosis not present

## 2019-12-06 DIAGNOSIS — Z9181 History of falling: Secondary | ICD-10-CM | POA: Diagnosis not present

## 2019-12-06 DIAGNOSIS — F419 Anxiety disorder, unspecified: Secondary | ICD-10-CM | POA: Diagnosis not present

## 2019-12-06 DIAGNOSIS — E785 Hyperlipidemia, unspecified: Secondary | ICD-10-CM | POA: Diagnosis not present

## 2019-12-06 DIAGNOSIS — K5790 Diverticulosis of intestine, part unspecified, without perforation or abscess without bleeding: Secondary | ICD-10-CM | POA: Diagnosis not present

## 2019-12-06 DIAGNOSIS — E1169 Type 2 diabetes mellitus with other specified complication: Secondary | ICD-10-CM | POA: Diagnosis not present

## 2019-12-06 DIAGNOSIS — E1110 Type 2 diabetes mellitus with ketoacidosis without coma: Secondary | ICD-10-CM | POA: Diagnosis not present

## 2019-12-06 DIAGNOSIS — E1122 Type 2 diabetes mellitus with diabetic chronic kidney disease: Secondary | ICD-10-CM | POA: Diagnosis not present

## 2019-12-06 DIAGNOSIS — M810 Age-related osteoporosis without current pathological fracture: Secondary | ICD-10-CM | POA: Diagnosis not present

## 2019-12-06 DIAGNOSIS — E1151 Type 2 diabetes mellitus with diabetic peripheral angiopathy without gangrene: Secondary | ICD-10-CM | POA: Diagnosis not present

## 2019-12-06 DIAGNOSIS — E1165 Type 2 diabetes mellitus with hyperglycemia: Secondary | ICD-10-CM | POA: Diagnosis not present

## 2019-12-06 DIAGNOSIS — M5136 Other intervertebral disc degeneration, lumbar region: Secondary | ICD-10-CM | POA: Diagnosis not present

## 2019-12-06 DIAGNOSIS — E039 Hypothyroidism, unspecified: Secondary | ICD-10-CM | POA: Diagnosis not present

## 2019-12-06 DIAGNOSIS — R32 Unspecified urinary incontinence: Secondary | ICD-10-CM | POA: Diagnosis not present

## 2019-12-07 ENCOUNTER — Other Ambulatory Visit: Payer: Self-pay

## 2019-12-07 ENCOUNTER — Ambulatory Visit (INDEPENDENT_AMBULATORY_CARE_PROVIDER_SITE_OTHER): Payer: PPO | Admitting: Family Medicine

## 2019-12-07 ENCOUNTER — Encounter: Payer: Self-pay | Admitting: Family Medicine

## 2019-12-07 VITALS — BP 130/64 | HR 65 | Temp 96.9°F

## 2019-12-07 DIAGNOSIS — R32 Unspecified urinary incontinence: Secondary | ICD-10-CM

## 2019-12-07 DIAGNOSIS — N1832 Chronic kidney disease, stage 3b: Secondary | ICD-10-CM | POA: Diagnosis not present

## 2019-12-07 DIAGNOSIS — I10 Essential (primary) hypertension: Secondary | ICD-10-CM | POA: Diagnosis not present

## 2019-12-07 DIAGNOSIS — E1165 Type 2 diabetes mellitus with hyperglycemia: Secondary | ICD-10-CM

## 2019-12-07 DIAGNOSIS — Z79810 Long term (current) use of selective estrogen receptor modulators (SERMs): Secondary | ICD-10-CM

## 2019-12-07 DIAGNOSIS — I700 Atherosclerosis of aorta: Secondary | ICD-10-CM | POA: Diagnosis not present

## 2019-12-07 DIAGNOSIS — E1151 Type 2 diabetes mellitus with diabetic peripheral angiopathy without gangrene: Secondary | ICD-10-CM | POA: Diagnosis not present

## 2019-12-07 DIAGNOSIS — D72829 Elevated white blood cell count, unspecified: Secondary | ICD-10-CM

## 2019-12-07 DIAGNOSIS — E1169 Type 2 diabetes mellitus with other specified complication: Secondary | ICD-10-CM | POA: Diagnosis not present

## 2019-12-07 DIAGNOSIS — J439 Emphysema, unspecified: Secondary | ICD-10-CM | POA: Diagnosis not present

## 2019-12-07 DIAGNOSIS — M1990 Unspecified osteoarthritis, unspecified site: Secondary | ICD-10-CM

## 2019-12-07 DIAGNOSIS — R238 Other skin changes: Secondary | ICD-10-CM | POA: Diagnosis not present

## 2019-12-07 DIAGNOSIS — M5136 Other intervertebral disc degeneration, lumbar region: Secondary | ICD-10-CM

## 2019-12-07 DIAGNOSIS — I129 Hypertensive chronic kidney disease with stage 1 through stage 4 chronic kidney disease, or unspecified chronic kidney disease: Secondary | ICD-10-CM | POA: Diagnosis not present

## 2019-12-07 DIAGNOSIS — F329 Major depressive disorder, single episode, unspecified: Secondary | ICD-10-CM | POA: Diagnosis not present

## 2019-12-07 DIAGNOSIS — N179 Acute kidney failure, unspecified: Secondary | ICD-10-CM

## 2019-12-07 DIAGNOSIS — Z9181 History of falling: Secondary | ICD-10-CM

## 2019-12-07 DIAGNOSIS — M810 Age-related osteoporosis without current pathological fracture: Secondary | ICD-10-CM

## 2019-12-07 DIAGNOSIS — E785 Hyperlipidemia, unspecified: Secondary | ICD-10-CM | POA: Diagnosis not present

## 2019-12-07 DIAGNOSIS — Z8619 Personal history of other infectious and parasitic diseases: Secondary | ICD-10-CM

## 2019-12-07 DIAGNOSIS — E1110 Type 2 diabetes mellitus with ketoacidosis without coma: Secondary | ICD-10-CM | POA: Diagnosis not present

## 2019-12-07 DIAGNOSIS — Z7982 Long term (current) use of aspirin: Secondary | ICD-10-CM

## 2019-12-07 DIAGNOSIS — L89151 Pressure ulcer of sacral region, stage 1: Secondary | ICD-10-CM | POA: Diagnosis not present

## 2019-12-07 DIAGNOSIS — Z87440 Personal history of urinary (tract) infections: Secondary | ICD-10-CM

## 2019-12-07 DIAGNOSIS — F419 Anxiety disorder, unspecified: Secondary | ICD-10-CM | POA: Diagnosis not present

## 2019-12-07 DIAGNOSIS — E1122 Type 2 diabetes mellitus with diabetic chronic kidney disease: Secondary | ICD-10-CM | POA: Diagnosis not present

## 2019-12-07 DIAGNOSIS — K5790 Diverticulosis of intestine, part unspecified, without perforation or abscess without bleeding: Secondary | ICD-10-CM

## 2019-12-07 DIAGNOSIS — H9190 Unspecified hearing loss, unspecified ear: Secondary | ICD-10-CM

## 2019-12-07 DIAGNOSIS — E039 Hypothyroidism, unspecified: Secondary | ICD-10-CM

## 2019-12-07 LAB — BASIC METABOLIC PANEL
BUN: 27 mg/dL — ABNORMAL HIGH (ref 6–23)
CO2: 24 mEq/L (ref 19–32)
Calcium: 10.3 mg/dL (ref 8.4–10.5)
Chloride: 102 mEq/L (ref 96–112)
Creatinine, Ser: 2.01 mg/dL — ABNORMAL HIGH (ref 0.40–1.20)
GFR: 23.69 mL/min — ABNORMAL LOW (ref >60.00)
Glucose, Bld: 166 mg/dL — ABNORMAL HIGH (ref 70–99)
Potassium: 3.9 mEq/L (ref 3.5–5.1)
Sodium: 137 mEq/L (ref 135–145)

## 2019-12-07 LAB — CBC WITH DIFFERENTIAL/PLATELET
Basophils Absolute: 0.1 10*3/uL (ref 0.0–0.1)
Basophils Relative: 0.7 % (ref 0.0–3.0)
Eosinophils Absolute: 0.2 10*3/uL (ref 0.0–0.7)
Eosinophils Relative: 1.5 % (ref 0.0–5.0)
HCT: 31.5 % — ABNORMAL LOW (ref 36.0–46.0)
Hemoglobin: 9.9 g/dL — ABNORMAL LOW (ref 12.0–15.0)
Lymphocytes Relative: 7.9 % — ABNORMAL LOW (ref 12.0–46.0)
Lymphs Abs: 1.2 10*3/uL (ref 0.7–4.0)
MCHC: 31.5 g/dL (ref 30.0–36.0)
MCV: 87.3 fl (ref 78.0–100.0)
Monocytes Absolute: 0.5 10*3/uL (ref 0.1–1.0)
Monocytes Relative: 3.5 % (ref 3.0–12.0)
Neutro Abs: 13 10*3/uL — ABNORMAL HIGH (ref 1.4–7.7)
Neutrophils Relative %: 86.4 % — ABNORMAL HIGH (ref 43.0–77.0)
Platelets: 743 10*3/uL — ABNORMAL HIGH (ref 150.0–400.0)
RBC: 3.61 Mil/uL — ABNORMAL LOW (ref 3.87–5.11)
RDW: 13.7 % (ref 11.5–15.5)
WBC: 15.1 10*3/uL — ABNORMAL HIGH (ref 4.0–10.5)

## 2019-12-07 NOTE — Patient Instructions (Addendum)
Keep drinking lots of fluids   Make sure to get vaccinated for covid  COVID-19 Vaccine Information can be found at: [email or web address] For questions related to vaccine distribution or appointments, please email vaccine@[email or web address] .[email or web address] or call [phone].     Labs today for cbc and kidney function  We will advise further after that returns   Work with PT and OT  Work with your walker    Continue using barrier cream to protect skin from urine - don't sit in a wet garment

## 2019-12-07 NOTE — Progress Notes (Signed)
Subjective:    Patient ID: Amanda Benson, female    DOB: 1935/12/18, 84 y.o.   MRN: [id number]  This visit occurred during the SARS-CoV-2 public health emergency.  Safety protocols were in place, including screening questions prior to the visit, additional usage of staff PPE, and extensive cleaning of exam room while observing appropriate contact time as indicated for disinfecting solutions.    HPI Pt presents for hospital f/u  She was hosp from 8/20 to 11/30/19 for DKA in the setting of sepsis from uti    hosp summary as follows   1.Uncontrolled type 2 diabetes mellitus, Hgb A1c 7.3, diabetes ketoacidosis, dyslipidemia.Patient was admitted to the medical ward, she received insulin intravenously with improvement of anion gap and serum glucose. She was placed on basal insulin 10 units of Levemir along with insulin sliding scale with good tolerance. At discharge patient will resume oral antihyperglycemic agents. Her hemoglobin A1c 7.3.  Follow-up as an outpatient, if persistent uncontrolled glucose to consider insulin therapy.  Continue simvastatin.  2.Sepsis due to urine tract infection, present on admission, endorgan damage acute kidney injury. Patient received supportive medical therapy with intravenous fluids and broad-spectrum antibiotic therapy. Her urine culture grew less than 10,000 unit, (unspecified organism).2 sets of blood cultures remain no growth.  Patient completed antibiotic therapy during her hospitalization. Further work-up with CT of the abdomen showed thickening anterior superior bladder wall with at least infiltrate change.Ultrasound of the abdomen with no acute changes, no hydronephrosis.  Her discharge wbc is trending down to 19,7.   3.Hypertension/ peripheral vascular disease.Continue blood pressure control with losartan and hctz.   Continue with raloxifen.  4.Depression. Continue bupropion and fluoxetine.  5. AKI on CKD stage  3b/ hypokalemia. Renal function stable with serum cr at 1,8 to 1.9, it is possible that is her new baseline.  Her discharge K is up to 4,0 with serum bicarbonate at 22.   6. Uncontrolled T2DM Hgb A1c 7.3/ dyslipidemia. Fasting glucose this am is 135 mg/dl. patient will resume oral antihyperglycemic therapy. Sitagliptin, glipizide and metformin.   Continue simvastatin with good toleration  Glucose at home 240s initially  Now down to low 100s  Not much of an appetite - but is eating  Drinking water and sugar free drinks   Of note covid test was negative Blood cultures were negative c diff test also negative   Latest labs Lab Results  Component Value Date   CREATININE 1.97 (H) 11/30/2019   BUN 26 (H) 11/30/2019   NA 141 11/30/2019   K 3.9 11/30/2019   CL 108 11/30/2019   CO2 22 11/30/2019   Lab Results  Component Value Date   ALT 11 11/22/2019   AST 19 11/22/2019   ALKPHOS 63 11/22/2019   BILITOT 0.5 11/22/2019   Lab Results  Component Value Date   WBC 19.7 (H) 11/30/2019   HGB 8.8 (L) 11/30/2019   HCT 27.2 (L) 11/30/2019   MCV 88.6 11/30/2019   PLT 533 (H) 11/30/2019   Lab Results  Component Value Date   HGBA1C 7.3 (A) 10/12/2019   Wt Readings from Last 3 Encounters:  11/25/19 138 lb 0.1 oz (62.6 kg)  10/12/19 136 lb (61.7 kg)  07/11/19 145 lb 7 oz (66 kg)     Home care has started to come out  PT and OT  Nurse came out yesterday  Aide coming out this afternoon   Wound/ulcer- on buttocks - is using desitin /trying not to offload area  Is  able to get up with a walker  Pulse ox is 97% -back in the upper 90s at home   Is able to urinate / able to feel the urge sometimes  Is incontinent for the most part   Patient Active Problem List   Diagnosis Date Noted  . AKI (acute kidney injury) (HCC) 11/23/2019  . Leukocytosis 11/23/2019  . DKA (diabetic ketoacidoses) (HCC) 11/23/2019  . Mobility impaired 10/13/2019  . Incontinence of urine 10/12/2019  .  Grief reaction 03/27/2019  . Elevated serum creatinine 06/13/2018  . Dysuria 06/13/2018  . Hearing loss 01/02/2018  . Subclinical hypothyroidism 10/26/2017  . Depression with anxiety 08/24/2016  . Falls frequently 08/24/2016  . Generalized weakness 08/24/2016  . Routine general medical examination at a health care facility 05/16/2015  . Breast calcifications on mammogram 03/01/2014  . Encounter for Medicare annual wellness exam 05/30/2013  . Breast calcification seen on mammogram 12/14/2012  . Breast calcification, left 11/28/2012  . History of falling 11/27/2012  . Poor balance 11/27/2012  . Colon cancer screening 05/10/2012  . Osteoporosis 06/21/2007  . DM (diabetes mellitus), type 2, uncontrolled (HCC) 09/20/2006  . Hyperlipidemia associated with type 2 diabetes mellitus (HCC) 09/20/2006  . Essential hypertension 09/20/2006  . Osteoarthritis 09/20/2006   Past Medical History:  Diagnosis Date  . Depression   . Diabetes mellitus    type II  . Hyperlipidemia   . Hypertension   . Obesity   . Osteoporosis    Past Surgical History:  Procedure Laterality Date  . ANKLE SURGERY     left medial malleblus fracture  . CHOLECYSTECTOMY     Social History   Tobacco Use  . Smoking status: Never Smoker  . Smokeless tobacco: Never Used  Vaping Use  . Vaping Use: Never used  Substance Use Topics  . Alcohol use: No    Alcohol/week: 0.0 standard drinks  . Drug use: No   Family History  Problem Relation Age of Onset  . Diabetes Mother   . Hypertension Mother   . Asthma Father   . Hypertension Sister   . Cancer Sister        lung  . Asthma Brother   . COPD Brother   . Cancer Maternal Grandmother        breast   . Asthma Brother   . Diabetes Mellitus II Daughter   . COPD Daughter    Allergies  Allergen Reactions  . Alendronate Sodium     REACTION: heartburn   Current Outpatient Medications on File Prior to Visit  Medication Sig Dispense Refill  . amLODipine  (NORVASC) 10 MG tablet TAKE 1 TABLET(10 MG) BY MOUTH DAILY 90 tablet 0  . aspirin 81 MG EC tablet Take 81 mg by mouth daily.      Marland Kitchen buPROPion (WELLBUTRIN XL) 300 MG 24 hr tablet TAKE 1 TABLET(300 MG) BY MOUTH DAILY 90 tablet 1  . FLUoxetine (PROZAC) 40 MG capsule TAKE 1 CAPSULE(40 MG) BY MOUTH DAILY 90 capsule 0  . glipiZIDE (GLUCOTROL XL) 10 MG 24 hr tablet TAKE 1 TABLET(10 MG) BY MOUTH DAILY 90 tablet 0  . losartan-hydrochlorothiazide (HYZAAR) 100-25 MG tablet TAKE 1 TABLET BY MOUTH DAILY 90 tablet 0  . metFORMIN (GLUCOPHAGE) 1000 MG tablet TAKE 1 TABLET(1000 MG) BY MOUTH TWICE DAILY WITH A MEAL 180 tablet 1  . metoprolol succinate (TOPROL-XL) 25 MG 24 hr tablet TAKE 1 TABLET(25 MG) BY MOUTH DAILY 90 tablet 0  . raloxifene (EVISTA) 60 MG tablet TAKE 1  TABLET(60 MG) BY MOUTH DAILY 90 tablet 1  . simvastatin (ZOCOR) 20 MG tablet TAKE 1 TABLET(20 MG) BY MOUTH DAILY 90 tablet 1  . sitaGLIPtin (JANUVIA) 50 MG tablet Take 50 mg by mouth daily.    . pantoprazole (PROTONIX) 40 MG tablet Take 1 tablet (40 mg total) by mouth daily. (Patient not taking: Reported on 12/07/2019) 30 tablet 0   No current facility-administered medications on file prior to visit.    Review of Systems  Constitutional: Positive for fatigue. Negative for activity change, appetite change, fever and unexpected weight change.  HENT: Negative for congestion, ear pain, rhinorrhea, sinus pressure and sore throat.   Eyes: Negative for pain, redness and visual disturbance.  Respiratory: Negative for cough, shortness of breath and wheezing.   Cardiovascular: Negative for chest pain and palpitations.  Gastrointestinal: Negative for abdominal pain, blood in stool, constipation and diarrhea.  Endocrine: Negative for polydipsia and polyuria.  Genitourinary: Negative for dysuria, frequency and urgency.       Incontinence of urine  Musculoskeletal: Negative for arthralgias, back pain and myalgias.  Skin: Negative for pallor and rash.        Skin irritation w/o breakdown on buttocks  Allergic/Immunologic: Negative for environmental allergies.  Neurological: Negative for dizziness, syncope and headaches.       Generalized weakness  Hematological: Negative for adenopathy. Does not bruise/bleed easily.  Psychiatric/Behavioral: Negative for decreased concentration and dysphoric mood. The patient is not nervous/anxious.        Objective:   Physical Exam Constitutional:      General: She is not in acute distress.    Appearance: Normal appearance. She is well-developed and normal weight. She is not ill-appearing or diaphoretic.     Comments: Frail appearing  In wheelchair   HENT:     Head: Normocephalic and atraumatic.  Eyes:     Conjunctiva/sclera: Conjunctivae normal.     Pupils: Pupils are equal, round, and reactive to light.  Neck:     Thyroid: No thyromegaly.     Vascular: No carotid bruit or JVD.  Cardiovascular:     Rate and Rhythm: Normal rate and regular rhythm.     Heart sounds: Normal heart sounds. No gallop.   Pulmonary:     Effort: Pulmonary effort is normal. No respiratory distress.     Breath sounds: Normal breath sounds. No wheezing or rales.  Abdominal:     General: Bowel sounds are normal. There is no distension or abdominal bruit.     Palpations: Abdomen is soft. There is no mass.     Tenderness: There is no abdominal tenderness.  Musculoskeletal:     Cervical back: Normal range of motion and neck supple.     Right lower leg: No edema.     Left lower leg: No edema.  Lymphadenopathy:     Cervical: No cervical adenopathy.  Skin:    General: Skin is warm and dry.     Coloration: Skin is not pale.     Findings: No erythema or rash.     Comments: Declines examination of buttocks   Neurological:     Mental Status: She is alert.     Coordination: Coordination normal.     Deep Tendon Reflexes: Reflexes are normal and symmetric. Reflexes normal.  Psychiatric:        Mood and Affect: Mood normal.            Assessment & Plan:   Problem List Items Addressed This Visit  Cardiovascular and Mediastinum   Essential hypertension    bp in fair control at this time  BP Readings from Last 1 Encounters:  12/07/19 130/64   No changes needed Most recent labs reviewed  Disc lifstyle change with low sodium diet and exercise          Endocrine   DM (diabetes mellitus), type 2, uncontrolled (HCC)    Pt had setback with severe uti/sepsis -leading to DKA Much improved now  Lab Results  Component Value Date   HGBA1C 7.3 (A) 10/12/2019    Labs today  Appetite is returning to normal and good diet       RESOLVED: DKA (diabetic ketoacidoses) (HCC)    Caused by infection / urinary  Reviewed hospital records, lab results and studies in detail   Resolved with tx         Musculoskeletal and Integument   Skin irritation    On buttocks due to incontinence in setting of recent hospitalization for uti  Noted by home care/home nurse if following Some imp with desitin /barrier cream and less sitting  Pt declines exam today due to above Will continue to follow         Genitourinary   AKI (acute kidney injury) (HCC) - Primary    With severe uti/sepsis and DKA ? If some chronic CKD - multi factorial  Reviewed hospital records, lab results and studies in detail   Enc good fluid intake No urinary symptoms Repeat renal labs today  Metformin may have to be changed if not improving      Relevant Orders   CBC with Differential/Platelet (Completed)   Basic metabolic panel (Completed)     Other   Leukocytosis    Due to recent uti/sepsis Reviewed hospital records, lab results and studies in detail   Feeling better  Re check today      Relevant Orders   CBC with Differential/Platelet (Completed)   H/O sepsis    Recent from severe uti  Reviewed hospital records, lab results and studies in detail   Blood cultures neg  Clinically improved  Re check cbc and bmet today   inst to update if any changes/setbacks

## 2019-12-09 DIAGNOSIS — Z8619 Personal history of other infectious and parasitic diseases: Secondary | ICD-10-CM | POA: Insufficient documentation

## 2019-12-09 DIAGNOSIS — R238 Other skin changes: Secondary | ICD-10-CM | POA: Insufficient documentation

## 2019-12-09 NOTE — Assessment & Plan Note (Signed)
Pt had setback with severe uti/sepsis -leading to DKA Much improved now  Lab Results  Component Value Date   HGBA1C 7.3 (A) 10/12/2019    Labs today  Appetite is returning to normal and good diet

## 2019-12-09 NOTE — Assessment & Plan Note (Signed)
On buttocks due to incontinence in setting of recent hospitalization for uti  Noted by home care/home nurse if following Some imp with desitin /barrier cream and less sitting  Pt declines exam today due to above Will continue to follow

## 2019-12-09 NOTE — Assessment & Plan Note (Signed)
Recent from severe uti  Reviewed hospital records, lab results and studies in detail   Blood cultures neg  Clinically improved  Re check cbc and bmet today  inst to update if any changes/setbacks

## 2019-12-09 NOTE — Assessment & Plan Note (Signed)
Caused by infection / urinary  Reviewed hospital records, lab results and studies in detail   Resolved with tx

## 2019-12-09 NOTE — Assessment & Plan Note (Signed)
Due to recent uti/sepsis Reviewed hospital records, lab results and studies in detail   Feeling better  Re check today

## 2019-12-09 NOTE — Assessment & Plan Note (Signed)
bp in fair control at this time  BP Readings from Last 1 Encounters:  12/07/19 130/64   No changes needed Most recent labs reviewed  Disc lifstyle change with low sodium diet and exercise

## 2019-12-09 NOTE — Assessment & Plan Note (Signed)
With severe uti/sepsis and DKA ? If some chronic CKD - multi factorial  Reviewed hospital records, lab results and studies in detail   Enc good fluid intake No urinary symptoms Repeat renal labs today  Metformin may have to be changed if not improving

## 2019-12-11 ENCOUNTER — Telehealth: Payer: Self-pay

## 2019-12-11 NOTE — Telephone Encounter (Signed)
-----   Message from Judy Pimple, MD sent at 12/09/2019  3:04 PM EDT ----- Amanda Benson is still high from infection but coming down  Platelets high also -suspect reactive for the same reasons but need to watch  Kidney numbers are still too high- please hold metformin for now and enc water intake and cut losartan dose in 1/2 (take 1/2 pill daily)  Re check cbc, bmet and a urine culture please in approx 10 days

## 2019-12-12 DIAGNOSIS — E1151 Type 2 diabetes mellitus with diabetic peripheral angiopathy without gangrene: Secondary | ICD-10-CM | POA: Diagnosis not present

## 2019-12-12 DIAGNOSIS — E1169 Type 2 diabetes mellitus with other specified complication: Secondary | ICD-10-CM | POA: Diagnosis not present

## 2019-12-12 DIAGNOSIS — I129 Hypertensive chronic kidney disease with stage 1 through stage 4 chronic kidney disease, or unspecified chronic kidney disease: Secondary | ICD-10-CM | POA: Diagnosis not present

## 2019-12-12 DIAGNOSIS — Z79810 Long term (current) use of selective estrogen receptor modulators (SERMs): Secondary | ICD-10-CM | POA: Diagnosis not present

## 2019-12-12 DIAGNOSIS — F329 Major depressive disorder, single episode, unspecified: Secondary | ICD-10-CM | POA: Diagnosis not present

## 2019-12-12 DIAGNOSIS — M5136 Other intervertebral disc degeneration, lumbar region: Secondary | ICD-10-CM | POA: Diagnosis not present

## 2019-12-12 DIAGNOSIS — F419 Anxiety disorder, unspecified: Secondary | ICD-10-CM | POA: Diagnosis not present

## 2019-12-12 DIAGNOSIS — E785 Hyperlipidemia, unspecified: Secondary | ICD-10-CM | POA: Diagnosis not present

## 2019-12-12 DIAGNOSIS — Z9181 History of falling: Secondary | ICD-10-CM | POA: Diagnosis not present

## 2019-12-12 DIAGNOSIS — M810 Age-related osteoporosis without current pathological fracture: Secondary | ICD-10-CM | POA: Diagnosis not present

## 2019-12-12 DIAGNOSIS — I700 Atherosclerosis of aorta: Secondary | ICD-10-CM | POA: Diagnosis not present

## 2019-12-12 DIAGNOSIS — R32 Unspecified urinary incontinence: Secondary | ICD-10-CM | POA: Diagnosis not present

## 2019-12-12 DIAGNOSIS — N1832 Chronic kidney disease, stage 3b: Secondary | ICD-10-CM | POA: Diagnosis not present

## 2019-12-12 DIAGNOSIS — K5790 Diverticulosis of intestine, part unspecified, without perforation or abscess without bleeding: Secondary | ICD-10-CM | POA: Diagnosis not present

## 2019-12-12 DIAGNOSIS — Z87440 Personal history of urinary (tract) infections: Secondary | ICD-10-CM | POA: Diagnosis not present

## 2019-12-12 DIAGNOSIS — E039 Hypothyroidism, unspecified: Secondary | ICD-10-CM | POA: Diagnosis not present

## 2019-12-12 DIAGNOSIS — E1165 Type 2 diabetes mellitus with hyperglycemia: Secondary | ICD-10-CM | POA: Diagnosis not present

## 2019-12-12 DIAGNOSIS — M1990 Unspecified osteoarthritis, unspecified site: Secondary | ICD-10-CM | POA: Diagnosis not present

## 2019-12-12 DIAGNOSIS — E1110 Type 2 diabetes mellitus with ketoacidosis without coma: Secondary | ICD-10-CM | POA: Diagnosis not present

## 2019-12-12 DIAGNOSIS — Z7982 Long term (current) use of aspirin: Secondary | ICD-10-CM | POA: Diagnosis not present

## 2019-12-12 DIAGNOSIS — E1122 Type 2 diabetes mellitus with diabetic chronic kidney disease: Secondary | ICD-10-CM | POA: Diagnosis not present

## 2019-12-12 DIAGNOSIS — L89151 Pressure ulcer of sacral region, stage 1: Secondary | ICD-10-CM | POA: Diagnosis not present

## 2019-12-12 DIAGNOSIS — J439 Emphysema, unspecified: Secondary | ICD-10-CM | POA: Diagnosis not present

## 2019-12-12 DIAGNOSIS — H9190 Unspecified hearing loss, unspecified ear: Secondary | ICD-10-CM | POA: Diagnosis not present

## 2019-12-13 DIAGNOSIS — Z9181 History of falling: Secondary | ICD-10-CM | POA: Diagnosis not present

## 2019-12-13 DIAGNOSIS — R32 Unspecified urinary incontinence: Secondary | ICD-10-CM | POA: Diagnosis not present

## 2019-12-13 DIAGNOSIS — E1151 Type 2 diabetes mellitus with diabetic peripheral angiopathy without gangrene: Secondary | ICD-10-CM | POA: Diagnosis not present

## 2019-12-13 DIAGNOSIS — E1169 Type 2 diabetes mellitus with other specified complication: Secondary | ICD-10-CM | POA: Diagnosis not present

## 2019-12-13 DIAGNOSIS — E1122 Type 2 diabetes mellitus with diabetic chronic kidney disease: Secondary | ICD-10-CM | POA: Diagnosis not present

## 2019-12-13 DIAGNOSIS — J439 Emphysema, unspecified: Secondary | ICD-10-CM | POA: Diagnosis not present

## 2019-12-13 DIAGNOSIS — Z7982 Long term (current) use of aspirin: Secondary | ICD-10-CM | POA: Diagnosis not present

## 2019-12-13 DIAGNOSIS — M5136 Other intervertebral disc degeneration, lumbar region: Secondary | ICD-10-CM | POA: Diagnosis not present

## 2019-12-13 DIAGNOSIS — H9190 Unspecified hearing loss, unspecified ear: Secondary | ICD-10-CM | POA: Diagnosis not present

## 2019-12-13 DIAGNOSIS — N1832 Chronic kidney disease, stage 3b: Secondary | ICD-10-CM | POA: Diagnosis not present

## 2019-12-13 DIAGNOSIS — Z87440 Personal history of urinary (tract) infections: Secondary | ICD-10-CM | POA: Diagnosis not present

## 2019-12-13 DIAGNOSIS — K5790 Diverticulosis of intestine, part unspecified, without perforation or abscess without bleeding: Secondary | ICD-10-CM | POA: Diagnosis not present

## 2019-12-13 DIAGNOSIS — I700 Atherosclerosis of aorta: Secondary | ICD-10-CM | POA: Diagnosis not present

## 2019-12-13 DIAGNOSIS — E1165 Type 2 diabetes mellitus with hyperglycemia: Secondary | ICD-10-CM | POA: Diagnosis not present

## 2019-12-13 DIAGNOSIS — F419 Anxiety disorder, unspecified: Secondary | ICD-10-CM | POA: Diagnosis not present

## 2019-12-13 DIAGNOSIS — M810 Age-related osteoporosis without current pathological fracture: Secondary | ICD-10-CM | POA: Diagnosis not present

## 2019-12-13 DIAGNOSIS — M1990 Unspecified osteoarthritis, unspecified site: Secondary | ICD-10-CM | POA: Diagnosis not present

## 2019-12-13 DIAGNOSIS — Z79810 Long term (current) use of selective estrogen receptor modulators (SERMs): Secondary | ICD-10-CM | POA: Diagnosis not present

## 2019-12-13 DIAGNOSIS — E785 Hyperlipidemia, unspecified: Secondary | ICD-10-CM | POA: Diagnosis not present

## 2019-12-13 DIAGNOSIS — I129 Hypertensive chronic kidney disease with stage 1 through stage 4 chronic kidney disease, or unspecified chronic kidney disease: Secondary | ICD-10-CM | POA: Diagnosis not present

## 2019-12-13 DIAGNOSIS — E039 Hypothyroidism, unspecified: Secondary | ICD-10-CM | POA: Diagnosis not present

## 2019-12-13 DIAGNOSIS — E1110 Type 2 diabetes mellitus with ketoacidosis without coma: Secondary | ICD-10-CM | POA: Diagnosis not present

## 2019-12-13 DIAGNOSIS — F329 Major depressive disorder, single episode, unspecified: Secondary | ICD-10-CM | POA: Diagnosis not present

## 2019-12-13 DIAGNOSIS — L89151 Pressure ulcer of sacral region, stage 1: Secondary | ICD-10-CM | POA: Diagnosis not present

## 2019-12-14 ENCOUNTER — Encounter: Payer: Self-pay | Admitting: Family Medicine

## 2019-12-14 MED ORDER — FLUCONAZOLE 100 MG PO TABS: ORAL_TABLET | ORAL | 0 refills | Status: DC

## 2019-12-19 DIAGNOSIS — Z741 Need for assistance with personal care: Secondary | ICD-10-CM | POA: Diagnosis not present

## 2019-12-19 DIAGNOSIS — E1110 Type 2 diabetes mellitus with ketoacidosis without coma: Secondary | ICD-10-CM | POA: Diagnosis not present

## 2019-12-19 DIAGNOSIS — Z9181 History of falling: Secondary | ICD-10-CM | POA: Diagnosis not present

## 2019-12-23 ENCOUNTER — Telehealth: Payer: Self-pay | Admitting: Family Medicine

## 2019-12-23 NOTE — Telephone Encounter (Signed)
Ordered for home care to draw at home

## 2019-12-23 NOTE — Telephone Encounter (Signed)
-----   Message from Aquilla Solian, RT sent at 12/13/2019  9:50 AM EDT ----- Regarding: Lab Orders for Monday 9.20.2021 Please place lab orders for Monday 9.20.2021, appt notes state " labs and urine for urine culture" Thank you, Jones Bales RT(R)

## 2019-12-24 ENCOUNTER — Other Ambulatory Visit: Payer: Self-pay

## 2019-12-24 ENCOUNTER — Telehealth: Payer: Self-pay | Admitting: Family Medicine

## 2019-12-24 ENCOUNTER — Other Ambulatory Visit (INDEPENDENT_AMBULATORY_CARE_PROVIDER_SITE_OTHER): Payer: PPO

## 2019-12-24 DIAGNOSIS — E1165 Type 2 diabetes mellitus with hyperglycemia: Secondary | ICD-10-CM

## 2019-12-24 DIAGNOSIS — R300 Dysuria: Secondary | ICD-10-CM

## 2019-12-24 DIAGNOSIS — N179 Acute kidney failure, unspecified: Secondary | ICD-10-CM | POA: Diagnosis not present

## 2019-12-24 DIAGNOSIS — R32 Unspecified urinary incontinence: Secondary | ICD-10-CM

## 2019-12-24 DIAGNOSIS — I10 Essential (primary) hypertension: Secondary | ICD-10-CM | POA: Diagnosis not present

## 2019-12-24 LAB — CBC WITH DIFFERENTIAL/PLATELET
Basophils Absolute: 0.1 10*3/uL (ref 0.0–0.1)
Basophils Relative: 0.7 % (ref 0.0–3.0)
Eosinophils Absolute: 0.2 10*3/uL (ref 0.0–0.7)
Eosinophils Relative: 2 % (ref 0.0–5.0)
HCT: 32 % — ABNORMAL LOW (ref 36.0–46.0)
Hemoglobin: 10.1 g/dL — ABNORMAL LOW (ref 12.0–15.0)
Lymphocytes Relative: 12.2 % (ref 12.0–46.0)
Lymphs Abs: 1.3 10*3/uL (ref 0.7–4.0)
MCHC: 31.6 g/dL (ref 30.0–36.0)
MCV: 88.2 fl (ref 78.0–100.0)
Monocytes Absolute: 0.6 10*3/uL (ref 0.1–1.0)
Monocytes Relative: 5.8 % (ref 3.0–12.0)
Neutro Abs: 8.6 10*3/uL — ABNORMAL HIGH (ref 1.4–7.7)
Neutrophils Relative %: 79.3 % — ABNORMAL HIGH (ref 43.0–77.0)
Platelets: 540 10*3/uL — ABNORMAL HIGH (ref 150.0–400.0)
RBC: 3.62 Mil/uL — ABNORMAL LOW (ref 3.87–5.11)
RDW: 14.1 % (ref 11.5–15.5)
WBC: 10.8 10*3/uL — ABNORMAL HIGH (ref 4.0–10.5)

## 2019-12-24 LAB — BASIC METABOLIC PANEL
BUN: 39 mg/dL — ABNORMAL HIGH (ref 6–23)
CO2: 25 mEq/L (ref 19–32)
Calcium: 9.6 mg/dL (ref 8.4–10.5)
Chloride: 103 mEq/L (ref 96–112)
Creatinine, Ser: 1.84 mg/dL — ABNORMAL HIGH (ref 0.40–1.20)
GFR: 26.23 mL/min — ABNORMAL LOW (ref >60.00)
Glucose, Bld: 259 mg/dL — ABNORMAL HIGH (ref 70–99)
Potassium: 5 mEq/L (ref 3.5–5.1)
Sodium: 136 mEq/L (ref 135–145)

## 2019-12-24 NOTE — Addendum Note (Signed)
Addended by: Shon Millet on: 12/24/2019 02:06 PM   Modules accepted: Orders

## 2019-12-24 NOTE — Telephone Encounter (Signed)
I wasn't here on Friday, not sure what happened to order but pt came in today and labs were done

## 2019-12-24 NOTE — Telephone Encounter (Signed)
Lab orders

## 2019-12-27 ENCOUNTER — Other Ambulatory Visit: Payer: PPO

## 2019-12-27 DIAGNOSIS — R32 Unspecified urinary incontinence: Secondary | ICD-10-CM | POA: Diagnosis not present

## 2019-12-27 DIAGNOSIS — R300 Dysuria: Secondary | ICD-10-CM

## 2019-12-29 ENCOUNTER — Other Ambulatory Visit: Payer: Self-pay | Admitting: Family Medicine

## 2019-12-29 LAB — URINE CULTURE
MICRO NUMBER:: 10987812
SPECIMEN QUALITY:: ADEQUATE

## 2019-12-29 NOTE — Telephone Encounter (Signed)
Urine grew bacteria (uti)  This may be causing some of her kidney lab elevation  I pended keflex to send to her pharmacy of choice   Please re check renal panel and urine cx in 2 weeks

## 2019-12-31 MED ORDER — CEPHALEXIN 250 MG PO CAPS: 250.0000 mg | ORAL_CAPSULE | Freq: Two times a day (BID) | ORAL | 0 refills | Status: DC

## 2019-12-31 NOTE — Telephone Encounter (Signed)
Left 2nd VM requesting daughter to call the office back

## 2019-12-31 NOTE — Telephone Encounter (Signed)
Left VM requesting pt's daughter [name] to call the office back

## 2019-12-31 NOTE — Telephone Encounter (Signed)
Daughter stopped by since she kept missing my call. Rx was sent and I gave her UA supplies. Daughter asked that Dr. Milinda Antis write a new order and have it faxed to fax # listed in last mychart message regarding RN doing labs at home, (can wait until you return Friday), and daughter will get UA at home and drop it off once she is done with abx in 2 weeks

## 2019-12-31 NOTE — Telephone Encounter (Signed)
Pt's daughter returned your call [phone]

## 2019-12-31 NOTE — Telephone Encounter (Signed)
Please see refill request where I left message:  Left 2nd VM requesting daughter to call office on refill request

## 2020-01-01 DIAGNOSIS — I129 Hypertensive chronic kidney disease with stage 1 through stage 4 chronic kidney disease, or unspecified chronic kidney disease: Secondary | ICD-10-CM | POA: Diagnosis not present

## 2020-01-01 DIAGNOSIS — H9190 Unspecified hearing loss, unspecified ear: Secondary | ICD-10-CM | POA: Diagnosis not present

## 2020-01-01 DIAGNOSIS — M1990 Unspecified osteoarthritis, unspecified site: Secondary | ICD-10-CM | POA: Diagnosis not present

## 2020-01-01 DIAGNOSIS — R32 Unspecified urinary incontinence: Secondary | ICD-10-CM | POA: Diagnosis not present

## 2020-01-01 DIAGNOSIS — L89151 Pressure ulcer of sacral region, stage 1: Secondary | ICD-10-CM | POA: Diagnosis not present

## 2020-01-01 DIAGNOSIS — K5790 Diverticulosis of intestine, part unspecified, without perforation or abscess without bleeding: Secondary | ICD-10-CM | POA: Diagnosis not present

## 2020-01-01 DIAGNOSIS — I700 Atherosclerosis of aorta: Secondary | ICD-10-CM | POA: Diagnosis not present

## 2020-01-01 DIAGNOSIS — M810 Age-related osteoporosis without current pathological fracture: Secondary | ICD-10-CM | POA: Diagnosis not present

## 2020-01-01 DIAGNOSIS — Z79810 Long term (current) use of selective estrogen receptor modulators (SERMs): Secondary | ICD-10-CM | POA: Diagnosis not present

## 2020-01-01 DIAGNOSIS — E039 Hypothyroidism, unspecified: Secondary | ICD-10-CM | POA: Diagnosis not present

## 2020-01-01 DIAGNOSIS — Z9181 History of falling: Secondary | ICD-10-CM | POA: Diagnosis not present

## 2020-01-01 DIAGNOSIS — F329 Major depressive disorder, single episode, unspecified: Secondary | ICD-10-CM | POA: Diagnosis not present

## 2020-01-01 DIAGNOSIS — F419 Anxiety disorder, unspecified: Secondary | ICD-10-CM | POA: Diagnosis not present

## 2020-01-01 DIAGNOSIS — E785 Hyperlipidemia, unspecified: Secondary | ICD-10-CM | POA: Diagnosis not present

## 2020-01-01 DIAGNOSIS — E1169 Type 2 diabetes mellitus with other specified complication: Secondary | ICD-10-CM | POA: Diagnosis not present

## 2020-01-01 DIAGNOSIS — N1832 Chronic kidney disease, stage 3b: Secondary | ICD-10-CM | POA: Diagnosis not present

## 2020-01-01 DIAGNOSIS — E1122 Type 2 diabetes mellitus with diabetic chronic kidney disease: Secondary | ICD-10-CM | POA: Diagnosis not present

## 2020-01-01 DIAGNOSIS — M5136 Other intervertebral disc degeneration, lumbar region: Secondary | ICD-10-CM | POA: Diagnosis not present

## 2020-01-01 DIAGNOSIS — E1151 Type 2 diabetes mellitus with diabetic peripheral angiopathy without gangrene: Secondary | ICD-10-CM | POA: Diagnosis not present

## 2020-01-01 DIAGNOSIS — Z7982 Long term (current) use of aspirin: Secondary | ICD-10-CM | POA: Diagnosis not present

## 2020-01-01 DIAGNOSIS — Z87440 Personal history of urinary (tract) infections: Secondary | ICD-10-CM | POA: Diagnosis not present

## 2020-01-01 DIAGNOSIS — E1165 Type 2 diabetes mellitus with hyperglycemia: Secondary | ICD-10-CM | POA: Diagnosis not present

## 2020-01-01 DIAGNOSIS — E1110 Type 2 diabetes mellitus with ketoacidosis without coma: Secondary | ICD-10-CM | POA: Diagnosis not present

## 2020-01-01 DIAGNOSIS — J439 Emphysema, unspecified: Secondary | ICD-10-CM | POA: Diagnosis not present

## 2020-01-04 NOTE — Telephone Encounter (Signed)
Orders faxed to [facility] nurse from [place] message

## 2020-01-04 NOTE — Telephone Encounter (Signed)
Order is in the IN box 

## 2020-01-08 DIAGNOSIS — E785 Hyperlipidemia, unspecified: Secondary | ICD-10-CM | POA: Diagnosis not present

## 2020-01-08 DIAGNOSIS — Z7982 Long term (current) use of aspirin: Secondary | ICD-10-CM | POA: Diagnosis not present

## 2020-01-08 DIAGNOSIS — Z741 Need for assistance with personal care: Secondary | ICD-10-CM | POA: Diagnosis not present

## 2020-01-08 DIAGNOSIS — Z9181 History of falling: Secondary | ICD-10-CM | POA: Diagnosis not present

## 2020-01-08 DIAGNOSIS — L89151 Pressure ulcer of sacral region, stage 1: Secondary | ICD-10-CM | POA: Diagnosis not present

## 2020-01-08 DIAGNOSIS — E1110 Type 2 diabetes mellitus with ketoacidosis without coma: Secondary | ICD-10-CM | POA: Diagnosis not present

## 2020-01-08 DIAGNOSIS — I129 Hypertensive chronic kidney disease with stage 1 through stage 4 chronic kidney disease, or unspecified chronic kidney disease: Secondary | ICD-10-CM | POA: Diagnosis not present

## 2020-01-08 DIAGNOSIS — E039 Hypothyroidism, unspecified: Secondary | ICD-10-CM | POA: Diagnosis not present

## 2020-01-08 DIAGNOSIS — M1990 Unspecified osteoarthritis, unspecified site: Secondary | ICD-10-CM | POA: Diagnosis not present

## 2020-01-08 DIAGNOSIS — Z87440 Personal history of urinary (tract) infections: Secondary | ICD-10-CM | POA: Diagnosis not present

## 2020-01-08 DIAGNOSIS — J439 Emphysema, unspecified: Secondary | ICD-10-CM | POA: Diagnosis not present

## 2020-01-08 DIAGNOSIS — R32 Unspecified urinary incontinence: Secondary | ICD-10-CM | POA: Diagnosis not present

## 2020-01-08 DIAGNOSIS — M810 Age-related osteoporosis without current pathological fracture: Secondary | ICD-10-CM | POA: Diagnosis not present

## 2020-01-08 DIAGNOSIS — F419 Anxiety disorder, unspecified: Secondary | ICD-10-CM | POA: Diagnosis not present

## 2020-01-08 DIAGNOSIS — Z79810 Long term (current) use of selective estrogen receptor modulators (SERMs): Secondary | ICD-10-CM | POA: Diagnosis not present

## 2020-01-08 DIAGNOSIS — E1169 Type 2 diabetes mellitus with other specified complication: Secondary | ICD-10-CM | POA: Diagnosis not present

## 2020-01-08 DIAGNOSIS — F329 Major depressive disorder, single episode, unspecified: Secondary | ICD-10-CM | POA: Diagnosis not present

## 2020-01-08 DIAGNOSIS — N1832 Chronic kidney disease, stage 3b: Secondary | ICD-10-CM | POA: Diagnosis not present

## 2020-01-08 DIAGNOSIS — E1151 Type 2 diabetes mellitus with diabetic peripheral angiopathy without gangrene: Secondary | ICD-10-CM | POA: Diagnosis not present

## 2020-01-08 DIAGNOSIS — I700 Atherosclerosis of aorta: Secondary | ICD-10-CM | POA: Diagnosis not present

## 2020-01-08 DIAGNOSIS — H9190 Unspecified hearing loss, unspecified ear: Secondary | ICD-10-CM | POA: Diagnosis not present

## 2020-01-08 DIAGNOSIS — E1165 Type 2 diabetes mellitus with hyperglycemia: Secondary | ICD-10-CM | POA: Diagnosis not present

## 2020-01-08 DIAGNOSIS — K5790 Diverticulosis of intestine, part unspecified, without perforation or abscess without bleeding: Secondary | ICD-10-CM | POA: Diagnosis not present

## 2020-01-08 DIAGNOSIS — M5136 Other intervertebral disc degeneration, lumbar region: Secondary | ICD-10-CM | POA: Diagnosis not present

## 2020-01-08 DIAGNOSIS — E1122 Type 2 diabetes mellitus with diabetic chronic kidney disease: Secondary | ICD-10-CM | POA: Diagnosis not present

## 2020-01-10 ENCOUNTER — Other Ambulatory Visit: Payer: Self-pay | Admitting: Family Medicine

## 2020-01-10 NOTE — Telephone Encounter (Signed)
Labs returned  Wbc and platelets are better at 9.7 and 425-reassuring More anemic with Hb of 9.1 Glucose of 217 Cr improved at 1.4  Urine culture- mixed flora /contamination but no uti   Overall looking better except for anemia  How is she feeling?

## 2020-01-14 NOTE — Telephone Encounter (Signed)
Pt's daughter notified of lab results and Dr. Royden Purl comments. Daughter said overall pt is back to baseline and acting her self and she "feels fine".   Daughter did have a question. Pt's bs #'s have been running high since pt isn't on any meds. Even these labs BS was 217. Daughter said PCP mentioned that pt could start on basil insulin if needed and daughter thinks that given blood sugars she does need to be on insulin.   [facility] on file

## 2020-01-14 NOTE — Telephone Encounter (Signed)
Is she taking januvia and glipizide  I want her to stay off metformin due to renal issues  I pended lantus to send to pharm of choice Please send whatever equip they need ? Do hey need education re: injecting it   Once started (we will go slow with 10 u) give me an update in a week with how bs are and we can titrate it

## 2020-01-14 NOTE — Telephone Encounter (Signed)
Left VM requesting daughter to call the office back

## 2020-01-14 NOTE — Telephone Encounter (Signed)
Pt returned your call. I went to tell her you would call  Her back but she wasn't on the phone anymore.

## 2020-01-14 NOTE — Telephone Encounter (Signed)
Called back and someone answered but couldn't hear them. Called back again and went th VM, will try to call back later

## 2020-01-15 ENCOUNTER — Other Ambulatory Visit: Payer: Self-pay | Admitting: Family Medicine

## 2020-01-15 ENCOUNTER — Encounter: Payer: PPO | Admitting: Family Medicine

## 2020-01-15 DIAGNOSIS — I129 Hypertensive chronic kidney disease with stage 1 through stage 4 chronic kidney disease, or unspecified chronic kidney disease: Secondary | ICD-10-CM | POA: Diagnosis not present

## 2020-01-15 DIAGNOSIS — E785 Hyperlipidemia, unspecified: Secondary | ICD-10-CM | POA: Diagnosis not present

## 2020-01-15 DIAGNOSIS — I700 Atherosclerosis of aorta: Secondary | ICD-10-CM | POA: Diagnosis not present

## 2020-01-15 DIAGNOSIS — N1832 Chronic kidney disease, stage 3b: Secondary | ICD-10-CM | POA: Diagnosis not present

## 2020-01-15 DIAGNOSIS — H9190 Unspecified hearing loss, unspecified ear: Secondary | ICD-10-CM | POA: Diagnosis not present

## 2020-01-15 DIAGNOSIS — E1151 Type 2 diabetes mellitus with diabetic peripheral angiopathy without gangrene: Secondary | ICD-10-CM | POA: Diagnosis not present

## 2020-01-15 DIAGNOSIS — J439 Emphysema, unspecified: Secondary | ICD-10-CM | POA: Diagnosis not present

## 2020-01-15 DIAGNOSIS — M1990 Unspecified osteoarthritis, unspecified site: Secondary | ICD-10-CM | POA: Diagnosis not present

## 2020-01-15 DIAGNOSIS — F419 Anxiety disorder, unspecified: Secondary | ICD-10-CM | POA: Diagnosis not present

## 2020-01-15 DIAGNOSIS — E1169 Type 2 diabetes mellitus with other specified complication: Secondary | ICD-10-CM | POA: Diagnosis not present

## 2020-01-15 DIAGNOSIS — R32 Unspecified urinary incontinence: Secondary | ICD-10-CM | POA: Diagnosis not present

## 2020-01-15 DIAGNOSIS — E1110 Type 2 diabetes mellitus with ketoacidosis without coma: Secondary | ICD-10-CM | POA: Diagnosis not present

## 2020-01-15 DIAGNOSIS — M5136 Other intervertebral disc degeneration, lumbar region: Secondary | ICD-10-CM | POA: Diagnosis not present

## 2020-01-15 DIAGNOSIS — E1165 Type 2 diabetes mellitus with hyperglycemia: Secondary | ICD-10-CM | POA: Diagnosis not present

## 2020-01-15 DIAGNOSIS — E1122 Type 2 diabetes mellitus with diabetic chronic kidney disease: Secondary | ICD-10-CM | POA: Diagnosis not present

## 2020-01-15 DIAGNOSIS — K5790 Diverticulosis of intestine, part unspecified, without perforation or abscess without bleeding: Secondary | ICD-10-CM | POA: Diagnosis not present

## 2020-01-15 DIAGNOSIS — M810 Age-related osteoporosis without current pathological fracture: Secondary | ICD-10-CM | POA: Diagnosis not present

## 2020-01-15 DIAGNOSIS — Z9181 History of falling: Secondary | ICD-10-CM | POA: Diagnosis not present

## 2020-01-15 DIAGNOSIS — Z7982 Long term (current) use of aspirin: Secondary | ICD-10-CM | POA: Diagnosis not present

## 2020-01-15 DIAGNOSIS — Z79810 Long term (current) use of selective estrogen receptor modulators (SERMs): Secondary | ICD-10-CM | POA: Diagnosis not present

## 2020-01-15 DIAGNOSIS — F329 Major depressive disorder, single episode, unspecified: Secondary | ICD-10-CM | POA: Diagnosis not present

## 2020-01-15 DIAGNOSIS — E039 Hypothyroidism, unspecified: Secondary | ICD-10-CM | POA: Diagnosis not present

## 2020-01-15 DIAGNOSIS — L89151 Pressure ulcer of sacral region, stage 1: Secondary | ICD-10-CM | POA: Diagnosis not present

## 2020-01-15 DIAGNOSIS — Z87440 Personal history of urinary (tract) infections: Secondary | ICD-10-CM | POA: Diagnosis not present

## 2020-01-16 NOTE — Telephone Encounter (Signed)
Left VM requesting daughter [name] to call us back

## 2020-01-18 ENCOUNTER — Other Ambulatory Visit: Payer: Self-pay | Admitting: Family Medicine

## 2020-01-22 MED ORDER — INSULIN PEN NEEDLE 29G X 12MM MISC: 0 refills | Status: DC

## 2020-01-22 MED ORDER — LANTUS SOLOSTAR 100 UNIT/ML ~~LOC~~ SOPN: 10.0000 [IU] | PEN_INJECTOR | Freq: Every day | SUBCUTANEOUS | 1 refills | Status: DC

## 2020-01-22 NOTE — Telephone Encounter (Signed)
Spoke with pt's daughter and she isn't on anything right now due to cost, Rx sent to pharmacy and daughter knows how to administer it due to having to give her self insulin in the past

## 2020-02-05 ENCOUNTER — Other Ambulatory Visit: Payer: Self-pay

## 2020-02-05 ENCOUNTER — Encounter: Payer: Self-pay | Admitting: Family Medicine

## 2020-02-05 ENCOUNTER — Ambulatory Visit (INDEPENDENT_AMBULATORY_CARE_PROVIDER_SITE_OTHER): Payer: PPO | Admitting: Family Medicine

## 2020-02-05 VITALS — BP 132/60 | HR 59 | Temp 96.5°F | Ht 63.5 in | Wt 135.5 lb

## 2020-02-05 DIAGNOSIS — E038 Other specified hypothyroidism: Secondary | ICD-10-CM | POA: Diagnosis not present

## 2020-02-05 DIAGNOSIS — E785 Hyperlipidemia, unspecified: Secondary | ICD-10-CM | POA: Diagnosis not present

## 2020-02-05 DIAGNOSIS — Z9181 History of falling: Secondary | ICD-10-CM | POA: Diagnosis not present

## 2020-02-05 DIAGNOSIS — R7989 Other specified abnormal findings of blood chemistry: Secondary | ICD-10-CM | POA: Diagnosis not present

## 2020-02-05 DIAGNOSIS — E1165 Type 2 diabetes mellitus with hyperglycemia: Secondary | ICD-10-CM

## 2020-02-05 DIAGNOSIS — F418 Other specified anxiety disorders: Secondary | ICD-10-CM

## 2020-02-05 DIAGNOSIS — H9193 Unspecified hearing loss, bilateral: Secondary | ICD-10-CM | POA: Diagnosis not present

## 2020-02-05 DIAGNOSIS — E1169 Type 2 diabetes mellitus with other specified complication: Secondary | ICD-10-CM

## 2020-02-05 DIAGNOSIS — Z0000 Encounter for general adult medical examination without abnormal findings: Secondary | ICD-10-CM

## 2020-02-05 DIAGNOSIS — I10 Essential (primary) hypertension: Secondary | ICD-10-CM | POA: Diagnosis not present

## 2020-02-05 DIAGNOSIS — Z23 Encounter for immunization: Secondary | ICD-10-CM | POA: Diagnosis not present

## 2020-02-05 DIAGNOSIS — M810 Age-related osteoporosis without current pathological fracture: Secondary | ICD-10-CM

## 2020-02-05 NOTE — Assessment & Plan Note (Signed)
Lipid panel today  Has been well controlled with simvastatin (last LDL of 48)  Disc goals for lipids and reasons to control them Rev last labs with pt Rev low sat fat diet in detail

## 2020-02-05 NOTE — Progress Notes (Signed)
Subjective:    Patient ID: Amanda Benson, female    DOB: [birth year], 84 y.o.   MRN: [id number]  This visit occurred during the SARS-CoV-2 public health emergency.  Safety protocols were in place, including screening questions prior to the visit, additional usage of staff PPE, and extensive cleaning of exam room while observing appropriate contact time as indicated for disinfecting solutions.    HPI Pt presents for amw and health mt exam with rev of chronic medical problems  I have personally reviewed the Medicare Annual Wellness questionnaire and have noted 1. The patient's medical and social history 2. Their use of alcohol, tobacco or illicit drugs 3. Their current medications and supplements 4. The patient's functional ability including ADL's, fall risks, home safety risks and hearing or visual             impairment. 5. Diet and physical activities 6. Evidence for depression or mood disorders  The patients weight, height, BMI have been recorded in the chart and visual acuity is per eye clinic.  I have made referrals, counseling and provided education to the patient based review of the above and I have provided the pt with a written personalized care plan for preventive services. Reviewed and updated provider list, see scanned forms.  See scanned forms.  Routine anticipatory guidance given to patient.  See health maintenance. Colon cancer screening-not interested  Breast cancer screening mammogram 12/15 -still declines Self breast exam- no lumps  Flu vaccine-today Tetanus vaccine Td 2/14 Pneumovax completed Zoster vaccine-only if covered  covid status - got her first shot , supposed to get 2nd one yesterday (moderna)  Dexa 2/14 osteopenia /osteoporosis (declines dexa now but may consider later) evista year 4 Falls- routine/frequent   Mobility impaired (uses walker and can sit down quickly on it if needed also)  Fractures - none Supplements-mvi (needs to get extra ca and  D) Exercise -walks with walker   Advance directive- is up to date  Cognitive function addressed- see scanned forms- and if abnormal then additional documentation follows.   No problems with memory generally  Only problems if she cannot hear or room is too loud    PMH and SH reviewed  Meds, vitals, and allergies reviewed.   ROS: See HPI.  Otherwise negative.    Weight : Wt Readings from Last 3 Encounters:  02/05/20 135 lb 8 oz (61.5 kg)  11/25/19 138 lb 0.1 oz (62.6 kg)  10/12/19 136 lb (61.7 kg)  wt is down 3 lb  23.63 kg/m  Appetite is good   Feels fair  Good days and bad days   Hearing/vision: No exam data present Last opthy exam -is due  Wears 2 hearing aides all the time now/is helpful   Care team [facility]- pcp Patty- opth  HTN bp is stable today  No cp or palpitations or headaches or edema  No side effects to medicines  BP Readings from Last 3 Encounters:  02/05/20 (!) 148/62  12/07/19 130/64  11/30/19 (!) 137/50     bp at home recently 140s or below systolic    Occ higher  Pulse Readings from Last 3 Encounters:  02/05/20 (!) 59  12/07/19 65  11/30/19 71    Taking amlodipine 10 mg daily  Losartan hct 100-25 mg once daily  Metoprolol xl 25 mg daily    DM2 Lab Results  Component Value Date   HGBA1C 7.3 (A) 10/12/2019  she had a setback with uti and sepsis with DKA in  august  On arb and statin  Glipizide januvia 50 mg daily  Glipizide xl 10 mg  lantus 10 units daily -was unable to get this since still in the gap   Out of the gap in January  ? If we could be   At home glucose is in high 100s to low 200s    Hyperlipidemia Taking simvastatin 20 mg daily  Lab Results  Component Value Date   CHOL 129 03/27/2019   HDL 48.70 03/27/2019   LDLCALC 48 03/27/2019   LDLDIRECT 59.0 06/09/2018   TRIG 161.0 (H) 03/27/2019   CHOLHDL 3 03/27/2019  due for re check   Subclinical hypothyroidism Lab Results  Component Value Date   TSH 3.39  08/30/2018    Due for re check   Renal insufficieny- had acute kidney injury in hospital in aug  Lab Results  Component Value Date   CREATININE 1.84 (H) 12/24/2019   BUN 39 (H) 12/24/2019   NA 136 12/24/2019   K 5.0 12/24/2019   CL 103 12/24/2019   CO2 25 12/24/2019  this was re checked by home care 10/5 and cr was down to 1.4  Due for re check 10.1 Hb on 10/5 also  Water intake - much better /more water   Continues fluoxetine for mood/depression along with wellbutrin   Patient Active Problem List   Diagnosis Date Noted  . H/O sepsis 12/09/2019  . AKI (acute kidney injury) (HCC) 11/23/2019  . Leukocytosis 11/23/2019  . Mobility impaired 10/13/2019  . Incontinence of urine 10/12/2019  . Grief reaction 03/27/2019  . Elevated serum creatinine 06/13/2018  . Dysuria 06/13/2018  . Hearing loss 01/02/2018  . Subclinical hypothyroidism 10/26/2017  . Depression with anxiety 08/24/2016  . Falls frequently 08/24/2016  . Generalized weakness 08/24/2016  . Routine general medical examination at a health care facility 05/16/2015  . Breast calcifications on mammogram 03/01/2014  . Encounter for Medicare annual wellness exam 05/30/2013  . Breast calcification seen on mammogram 12/14/2012  . Breast calcification, left 11/28/2012  . History of falling 11/27/2012  . Poor balance 11/27/2012  . Colon cancer screening 05/10/2012  . Osteoporosis 06/21/2007  . DM (diabetes mellitus), type 2, uncontrolled (HCC) 09/20/2006  . Hyperlipidemia associated with type 2 diabetes mellitus (HCC) 09/20/2006  . Essential hypertension 09/20/2006  . Osteoarthritis 09/20/2006   Past Medical History:  Diagnosis Date  . Depression   . Diabetes mellitus    type II  . Hyperlipidemia   . Hypertension   . Obesity   . Osteoporosis    Past Surgical History:  Procedure Laterality Date  . ANKLE SURGERY     left medial malleblus fracture  . CHOLECYSTECTOMY     Social History   Tobacco Use  . Smoking  status: Never Smoker  . Smokeless tobacco: Never Used  Vaping Use  . Vaping Use: Never used  Substance Use Topics  . Alcohol use: No    Alcohol/week: 0.0 standard drinks  . Drug use: No   Family History  Problem Relation Age of Onset  . Diabetes Mother   . Hypertension Mother   . Asthma Father   . Hypertension Sister   . Cancer Sister        lung  . Asthma Brother   . COPD Brother   . Cancer Maternal Grandmother        breast   . Asthma Brother   . Diabetes Mellitus II Daughter   . COPD Daughter    Allergies  Allergen Reactions  . Alendronate Sodium     REACTION: heartburn   Current Outpatient Medications on File Prior to Visit  Medication Sig Dispense Refill  . amLODipine (NORVASC) 10 MG tablet TAKE 1 TABLET(10 MG) BY MOUTH DAILY 90 tablet 0  . aspirin 81 MG EC tablet Take 81 mg by mouth daily.      Marland Kitchen buPROPion (WELLBUTRIN XL) 300 MG 24 hr tablet TAKE 1 TABLET(300 MG) BY MOUTH DAILY 90 tablet 1  . FLUoxetine (PROZAC) 40 MG capsule TAKE 1 CAPSULE(40 MG) BY MOUTH DAILY 90 capsule 0  . glipiZIDE (GLUCOTROL XL) 10 MG 24 hr tablet TAKE 1 TABLET(10 MG) BY MOUTH DAILY 90 tablet 0  . losartan-hydrochlorothiazide (HYZAAR) 100-25 MG tablet TAKE 1 TABLET BY MOUTH DAILY 90 tablet 0  . metoprolol succinate (TOPROL-XL) 25 MG 24 hr tablet TAKE 1 TABLET(25 MG) BY MOUTH DAILY 90 tablet 0  . raloxifene (EVISTA) 60 MG tablet TAKE 1 TABLET(60 MG) BY MOUTH DAILY 90 tablet 1  . simvastatin (ZOCOR) 20 MG tablet TAKE 1 TABLET(20 MG) BY MOUTH DAILY 90 tablet 1  . cephALEXin (KEFLEX) 250 MG capsule Take 1 capsule (250 mg total) by mouth 2 (two) times daily. (Patient not taking: Reported on 02/05/2020) 14 capsule 0  . fluconazole (DIFLUCAN) 100 MG tablet Take one pill by mouth every other day for 3 doses (Patient not taking: Reported on 02/05/2020) 3 tablet 0  . insulin glargine (LANTUS SOLOSTAR) 100 UNIT/ML Solostar Pen Inject 10 Units into the skin daily. (Patient not taking: Reported on 02/05/2020)  15 mL 1  . Insulin Pen Needle 29G X MISC Use with insulin daily (Patient not taking: Reported on 02/05/2020) 90 each 0  . pantoprazole (PROTONIX) 40 MG tablet Take 1 tablet (40 mg total) by mouth daily. (Patient not taking: Reported on 12/07/2019) 30 tablet 0  . sitaGLIPtin (JANUVIA) 50 MG tablet Take 50 mg by mouth daily. (Patient not taking: Reported on 02/05/2020)     No current facility-administered medications on file prior to visit.    Review of Systems  Constitutional: Positive for fatigue. Negative for activity change, appetite change, fever and unexpected weight change.       Energy comes and goes Good appetite   HENT: Negative for congestion, ear pain, rhinorrhea, sinus pressure and sore throat.   Eyes: Negative for pain, redness and visual disturbance.  Respiratory: Negative for cough, shortness of breath and wheezing.   Cardiovascular: Negative for chest pain and palpitations.  Gastrointestinal: Negative for abdominal pain, blood in stool, constipation and diarrhea.  Endocrine: Negative for polydipsia and polyuria.  Genitourinary: Negative for dysuria, frequency and urgency.       Baseline incontinence  Musculoskeletal: Positive for arthralgias. Negative for back pain and myalgias.  Skin: Negative for pallor and rash.  Allergic/Immunologic: Negative for environmental allergies.  Neurological: Negative for dizziness, syncope and headaches.  Hematological: Negative for adenopathy. Does not bruise/bleed easily.  Psychiatric/Behavioral: Positive for dysphoric mood. Negative for decreased concentration. The patient is nervous/anxious.        Objective:   Physical Exam Constitutional:      General: She is not in acute distress.    Appearance: Normal appearance. She is well-developed and normal weight. She is not ill-appearing or diaphoretic.  HENT:     Head: Normocephalic and atraumatic.     Right Ear: Tympanic membrane, ear canal and external ear normal.     Left Ear:  Tympanic membrane, ear canal and external ear normal.  Nose: Nose normal. No congestion.     Mouth/Throat:     Mouth: Mucous membranes are moist.     Pharynx: Oropharynx is clear. No posterior oropharyngeal erythema.  Eyes:     General: No scleral icterus.    Extraocular Movements: Extraocular movements intact.     Conjunctiva/sclera: Conjunctivae normal.     Pupils: Pupils are equal, round, and reactive to light.  Neck:     Thyroid: No thyromegaly.     Vascular: No carotid bruit or JVD.  Cardiovascular:     Rate and Rhythm: Normal rate and regular rhythm.     Pulses: Normal pulses.     Heart sounds: Normal heart sounds. No gallop.   Pulmonary:     Effort: Pulmonary effort is normal. No respiratory distress.     Breath sounds: Normal breath sounds. No wheezing.     Comments: Good air exch Chest:     Chest wall: No tenderness.  Abdominal:     General: Bowel sounds are normal. There is no distension or abdominal bruit.     Palpations: Abdomen is soft. There is no mass.     Tenderness: There is no abdominal tenderness.     Hernia: No hernia is present.  Genitourinary:    Comments: Declines breast cancer screening  Exam not done Musculoskeletal:        General: No tenderness. Normal range of motion.     Cervical back: Normal range of motion and neck supple. No rigidity. No muscular tenderness.     Right lower leg: No edema.     Left lower leg: No edema.  Lymphadenopathy:     Cervical: No cervical adenopathy.  Skin:    General: Skin is warm and dry.     Coloration: Skin is not pale.     Findings: No erythema or rash.     Comments: Solar lentigines diffusely Also some sks  Neurological:     Mental Status: She is alert. Mental status is at baseline.     Cranial Nerves: No cranial nerve deficit.     Motor: No abnormal muscle tone.     Coordination: Coordination normal.     Gait: Gait normal.     Deep Tendon Reflexes: Reflexes are normal and symmetric. Reflexes normal.    Psychiatric:        Mood and Affect: Mood normal.        Cognition and Memory: Cognition and memory normal.     Comments: Mentally sharp Hearing loss makes communication more difficult  Pleasant and good mood today           Assessment & Plan:   Problem List Items Addressed This Visit      Cardiovascular and Mediastinum   Essential hypertension    bp in fair control at this time  BP Readings from Last 1 Encounters:  02/05/20 132/60   No changes needed Most recent labs reviewed  Disc lifstyle change with low sodium diet and exercise  Plans to continue amlodipine 10 mg daily , losartan hct 100-25 mg daily and metoprolol xl 25 mg daily Watching renal function closely        Endocrine   DM (diabetes mellitus), type 2, uncontrolled (HCC)    Due for A1C today  Expect it to be up in setting of noncompliance with medications (financial -donut hole and medications not covered) She continues glipizide  Has not started [place] or lantus  Has form to sign for pt asst for [place]  Will  look into options for lantus  She will not have coverage until January Taking ace and statin  Family plans to schedule eye exam Excellent foot care      Relevant Orders   CBC with Differential/Platelet   Comprehensive metabolic panel   Hemoglobin A1c   Hyperlipidemia associated with type 2 diabetes mellitus (HCC)    Lipid panel today  Has been well controlled with simvastatin (last LDL of 48)  Disc goals for lipids and reasons to control them Rev last labs with pt Rev low sat fat diet in detail       Relevant Orders   Comprehensive metabolic panel   Lipid panel   Subclinical hypothyroidism    No clinical changes  TSH and FT4 ordered       Relevant Orders   TSH   T4, free     Nervous and Auditory   Hearing loss    Hearing aides helping  She wears them full time        Musculoskeletal and Integument   Osteoporosis    Last dexa 2/14 in pt with very high fall risk but no  fractures  Taking evista (year 4) and doing well with it  She declines dexa f/u now- will re consider later  Taking mvi-encouraged to add ca and D  Walking with walker for exercise      Relevant Orders   VITAMIN D 25 Hydroxy (Vit-D Deficiency, Fractures)     Other   History of falling    Uses walker full time  Still falls Has had PT  Family watches carefully  Disc fall prevention in the home      Encounter for Medicare annual wellness exam - Primary    Reviewed health habits including diet and exercise and skin cancer prevention Reviewed appropriate screening tests for age  Also reviewed health mt list, fam hx and immunization status , as well as social and family history   See HPI Labs reviewed  Not interested in colon or breast cancer screening at her age  Wants to put off dexa (has falls but no fractures and on evista year 4) Flu shot given today  Had first moderna covid vaccine and planning the 2nd Interested in shingrix only if covered by insurance  Advance directive is up to date No cognitive concerns Due for eye exam- plans to schedule it  Continues to use hearing aides  Having trouble affording diabetes medicines in the gap with medicare (looking for pt assistance)       Routine general medical examination at a health care facility    Reviewed health habits including diet and exercise and skin cancer prevention Reviewed appropriate screening tests for age  Also reviewed health mt list, fam hx and immunization status , as well as social and family history   See HPI Labs reviewed  Not interested in colon or breast cancer screening at her age  Wants to put off dexa (has falls but no fractures and on evista year 4) Flu shot given today  Had first moderna covid vaccine and planning the 2nd Interested in shingrix only if covered by insurance  Advance directive is up to date No cognitive concerns Due for eye exam- plans to schedule it  Continues to use hearing  aides  Having trouble affording diabetes medicines in the gap with medicare (looking for pt assistance)      Relevant Orders   CBC with Differential/Platelet   Depression with anxiety    Stable with  fluoxetine and wellbutrin  At times a struggle with current health problems  Encouraged her to remain physically and mentally active       Elevated serum creatinine    This came down to 1.4 with last home care draw (after AKI with sepsis 1.84 in sept)  Labs today  Doing better with fluid intake Off of metformin entirely but still taking losartan hct       Relevant Orders   CBC with Differential/Platelet   Comprehensive metabolic panel    Other Visit Diagnoses    Need for influenza vaccination       Relevant Orders   Flu Vaccine QUAD High Dose(Fluad) (Completed)

## 2020-02-05 NOTE — Assessment & Plan Note (Signed)
Reviewed health habits including diet and exercise and skin cancer prevention Reviewed appropriate screening tests for age  Also reviewed health mt list, fam hx and immunization status , as well as social and family history   See HPI Labs reviewed  Not interested in colon or breast cancer screening at her age  Wants to put off dexa (has falls but no fractures and on evista year 4) Flu shot given today  Had first moderna covid vaccine and planning the 2nd Interested in shingrix only if covered by insurance  Advance directive is up to date No cognitive concerns Due for eye exam- plans to schedule it  Continues to use hearing aides  Having trouble affording diabetes medicines in the gap with medicare (looking for pt assistance)

## 2020-02-05 NOTE — Assessment & Plan Note (Signed)
Stable with fluoxetine and wellbutrin  At times a struggle with current health problems  Encouraged her to remain physically and mentally active

## 2020-02-05 NOTE — Assessment & Plan Note (Signed)
This came down to 1.4 with last home care draw (after AKI with sepsis 1.84 in sept)  Labs today  Doing better with fluid intake Off of metformin entirely but still taking losartan hct

## 2020-02-05 NOTE — Assessment & Plan Note (Addendum)
Due for A1C today  Expect it to be up in setting of noncompliance with medications (financial -donut hole and medications not covered) She continues glipizide  Has not started [place] or lantus  Has form to sign for pt asst for [place]  Will look into options for lantus  She will not have coverage until January Taking ace and statin  Family plans to schedule eye exam Excellent foot care

## 2020-02-05 NOTE — Assessment & Plan Note (Signed)
No clinical changes TSH and FT4 ordered  

## 2020-02-05 NOTE — Assessment & Plan Note (Signed)
bp in fair control at this time  BP Readings from Last 1 Encounters:  02/05/20 132/60   No changes needed Most recent labs reviewed  Disc lifstyle change with low sodium diet and exercise  Plans to continue amlodipine 10 mg daily , losartan hct 100-25 mg daily and metoprolol xl 25 mg daily Watching renal function closely

## 2020-02-05 NOTE — Assessment & Plan Note (Signed)
Uses walker full time  Still falls Has had PT  Family watches carefully  Disc fall prevention in the home

## 2020-02-05 NOTE — Assessment & Plan Note (Signed)
Reviewed health habits including diet and exercise and skin cancer prevention Reviewed appropriate screening tests for age  Also reviewed health mt list, fam hx and immunization status , as well as social and family history   See HPI Labs reviewed  Not interested in colon or breast cancer screening at her age  Wants to put off dexa (has falls but no fractures and on evista year 4) Flu shot given today  Had first moderna covid vaccine and planning the 2nd Interested in shingrix only if covered by insurance  Advance directive is up to date No cognitive concerns Due for eye exam- plans to schedule it  Continues to use hearing aides  Having trouble affording diabetes medicines in the gap with medicare (looking for pt assistance)  

## 2020-02-05 NOTE — Assessment & Plan Note (Signed)
Hearing aides helping  She wears them full time

## 2020-02-05 NOTE — Assessment & Plan Note (Signed)
Last dexa 2/14 in pt with very high fall risk but no fractures  Taking evista (year 4) and doing well with it  She declines dexa f/u now- will re consider later  Taking mvi-encouraged to add ca and D  Walking with walker for exercise

## 2020-02-05 NOTE — Patient Instructions (Addendum)
If you are interested in the new shingles vaccine (Shingrix) - call your local pharmacy to check on coverage and availability  If affordable, get on a wait list at your pharmacy to get the vaccine.  Get your 2nd moderna vaccine   Try to get 1200-1500 mg of calcium per day with at least 1000 iu of vitamin D - for bone health   Please call and schedule your diabetic eye exam   Please bring the form back for januvia  I will check on patient assistance for lantus   Labs today

## 2020-02-06 LAB — CBC WITH DIFFERENTIAL/PLATELET
Basophils Absolute: 0.1 10*3/uL (ref 0.0–0.1)
Basophils Relative: 0.8 % (ref 0.0–3.0)
Eosinophils Absolute: 0.2 10*3/uL (ref 0.0–0.7)
Eosinophils Relative: 1.4 % (ref 0.0–5.0)
HCT: 32.5 % — ABNORMAL LOW (ref 36.0–46.0)
Hemoglobin: 10.4 g/dL — ABNORMAL LOW (ref 12.0–15.0)
Lymphocytes Relative: 14.8 % (ref 12.0–46.0)
Lymphs Abs: 1.7 10*3/uL (ref 0.7–4.0)
MCHC: 32.1 g/dL (ref 30.0–36.0)
MCV: 85.9 fl (ref 78.0–100.0)
Monocytes Absolute: 0.5 10*3/uL (ref 0.1–1.0)
Monocytes Relative: 4.2 % (ref 3.0–12.0)
Neutro Abs: 8.8 10*3/uL — ABNORMAL HIGH (ref 1.4–7.7)
Neutrophils Relative %: 78.8 % — ABNORMAL HIGH (ref 43.0–77.0)
Platelets: 522 10*3/uL — ABNORMAL HIGH (ref 150.0–400.0)
RBC: 3.78 Mil/uL — ABNORMAL LOW (ref 3.87–5.11)
RDW: 13.9 % (ref 11.5–15.5)
WBC: 11.2 10*3/uL — ABNORMAL HIGH (ref 4.0–10.5)

## 2020-02-06 LAB — COMPREHENSIVE METABOLIC PANEL
ALT: 14 U/L (ref 0–35)
AST: 20 U/L (ref 0–37)
Albumin: 3.2 g/dL — ABNORMAL LOW (ref 3.5–5.2)
Alkaline Phosphatase: 70 U/L (ref 39–117)
BUN: 33 mg/dL — ABNORMAL HIGH (ref 6–23)
CO2: 27 mEq/L (ref 19–32)
Calcium: 9.9 mg/dL (ref 8.4–10.5)
Chloride: 99 mEq/L (ref 96–112)
Creatinine, Ser: 1.61 mg/dL — ABNORMAL HIGH (ref 0.40–1.20)
GFR: 29.61 mL/min — ABNORMAL LOW (ref >60.00)
Glucose, Bld: 235 mg/dL — ABNORMAL HIGH (ref 70–99)
Potassium: 4.4 mEq/L (ref 3.5–5.1)
Sodium: 135 mEq/L (ref 135–145)
Total Bilirubin: 0.3 mg/dL (ref 0.2–1.2)
Total Protein: 6.5 g/dL (ref 6.0–8.3)

## 2020-02-06 LAB — LIPID PANEL
Cholesterol: 132 mg/dL (ref 0–200)
HDL: 40.8 mg/dL (ref >39.00)
LDL Cholesterol: 54 mg/dL (ref 0–99)
NonHDL: 90.77
Total CHOL/HDL Ratio: 3
Triglycerides: 182 mg/dL — ABNORMAL HIGH (ref 0.0–149.0)
VLDL: 36.4 mg/dL (ref 0.0–40.0)

## 2020-02-06 LAB — T4, FREE: Free T4: 0.82 ng/dL (ref 0.60–1.60)

## 2020-02-06 LAB — HEMOGLOBIN A1C: Hgb A1c MFr Bld: 8 % — ABNORMAL HIGH (ref 4.6–6.5)

## 2020-02-06 LAB — TSH: TSH: 3.66 u[IU]/mL (ref 0.35–4.50)

## 2020-02-06 LAB — VITAMIN D 25 HYDROXY (VIT D DEFICIENCY, FRACTURES): VITD: 62.13 ng/mL (ref 30.00–100.00)

## 2020-02-07 ENCOUNTER — Telehealth: Payer: Self-pay | Admitting: Family Medicine

## 2020-02-07 ENCOUNTER — Telehealth: Payer: Self-pay

## 2020-02-07 NOTE — Chronic Care Management (AMB) (Signed)
Chronic Care Management Pharmacy  Name: Amanda Benson  MRN: [id number] DOB: 04/26/1935  Chief Complaint/ HPI  Amanda Benson,  84 y.o. , female presents for their Initial CCM visit with the clinical pharmacist via telephone.  PCP : Abner Greenspan, MD  Their chronic conditions include: DM, HTN, HLD, osteoporosis, subclinical hypothyroidism, depression, hearing loss, frequent falls   Office Visits:  02/05/20: PCP/[facility] - HTN fair control, watch renal function closely; DM due for A1c today, unable to afford insulin due to coverage gap, continues glipizide, not on Januvia or Lantus, on ACE and statin; HLD, update panel today on simvastatin; subclinical hypothyroidism update labs; osteoporosis on year 4 [employer], encourged vitamin D and calcium; history of falls; depression stable on fluoxetine and wellbutrin  Patient concerns: urinary incontinence  Daily routine:  Wakes up around 10-11 AM, daughter checks vitals - BP, BG, oxygen, and weight  Eats breakfast, crochets  Able to walk back and forth to bathroom, change clothes, shower Wears depends at night  Eats 3 meals/day [name] and husband live with patient and 2 teenage step daughters   Medications: Outpatient Encounter Medications as of 02/08/2020  Medication Sig Note   amLODipine (NORVASC) 10 MG tablet TAKE 1 TABLET(10 MG) BY MOUTH DAILY    aspirin 81 MG EC tablet Take 81 mg by mouth daily.      buPROPion (WELLBUTRIN XL) 300 MG 24 hr tablet TAKE 1 TABLET(300 MG) BY MOUTH DAILY    cephALEXin (KEFLEX) 250 MG capsule Take 1 capsule (250 mg total) by mouth 2 (two) times daily. (Patient not taking: Reported on 02/05/2020)    fluconazole (DIFLUCAN) 100 MG tablet Take one pill by mouth every other day for 3 doses (Patient not taking: Reported on 02/05/2020)    FLUoxetine (PROZAC) 40 MG capsule TAKE 1 CAPSULE(40 MG) BY MOUTH DAILY    glipiZIDE (GLUCOTROL XL) 10 MG 24 hr tablet TAKE 1 TABLET(10 MG) BY MOUTH DAILY    insulin glargine  (LANTUS SOLOSTAR) 100 UNIT/ML Solostar Pen Inject 10 Units into the skin daily. (Patient not taking: Reported on 02/05/2020)    Insulin Pen Needle 29G X 12MM MISC Use with insulin daily (Patient not taking: Reported on 02/05/2020)    losartan-hydrochlorothiazide (HYZAAR) 100-25 MG tablet TAKE 1 TABLET BY MOUTH DAILY    metoprolol succinate (TOPROL-XL) 25 MG 24 hr tablet TAKE 1 TABLET(25 MG) BY MOUTH DAILY    pantoprazole (PROTONIX) 40 MG tablet Take 1 tablet (40 mg total) by mouth daily. (Patient not taking: Reported on 12/07/2019)    raloxifene (EVISTA) 60 MG tablet TAKE 1 TABLET(60 MG) BY MOUTH DAILY    simvastatin (ZOCOR) 20 MG tablet TAKE 1 TABLET(20 MG) BY MOUTH DAILY    sitaGLIPtin (JANUVIA) 50 MG tablet Take 50 mg by mouth daily. (Patient not taking: Reported on 02/05/2020) 11/23/2019: Pt. Has discontinued medication due to high cost.   No facility-administered encounter medications on file as of 02/08/2020.   Current Diagnosis/Assessment:  Goals     Patient Stated     Starting 08/29/2018, I will continue to take medications as prescribed.       Pharmacy Care Plan     CARE PLAN ENTRY  Current Barriers:   Chronic Disease Management support, education, and care coordination needs related to Hypertension and Diabetes   Hypertension BP Readings from Last 3 Encounters:  02/05/20 132/60  12/07/19 130/64  11/30/19 (!) 137/50   Pharmacist Clinical Goal(s): o Over the next 3 months, patient will work with  PharmD and providers to maintain BP goal <140/90  Current regimen:   Amlodipine 10 mg - 1 tablet daily  Losartan/HCTZ 100-25 mg - 1/2 tablet daily  Metoprolol succinate 25 mg - 1 tablet daily  Interventions: o Review home blood pressure log   Patient self care activities - Over the next 3 months, patient will: o Check BP daily, document, and provide at future appointments o Ensure daily salt intake < 2300 mg/day  Diabetes Lab Results  Component Value Date/Time    HGBA1C 8.0 (H) 02/05/2020 04:00 PM   HGBA1C 7.3 (A) 10/12/2019 11:51 AM   HGBA1C 6.9 (A) 07/11/2019 02:35 PM   HGBA1C 7.3 (H) 03/27/2019 03:11 PM   HGBA1C 6.8 01/13/2016 12:00 AM    Pharmacist Clinical Goal(s): o Over the next 3 months, patient will work with PharmD and providers to achieve A1c goal <7%  Current regimen:  o Glipizide 10 mg XL - 1 tablet daily  Interventions: o Assist with patient cost barriers  Patient self care activities - Over the next 3 months, patient will: o Check blood sugar before breakfast, document, and provide at future appointments o Contact provider with any episodes of hypoglycemia  Medication management  Pharmacist Clinical Goal(s): o Over the next 3 months, patient will work with PharmD and providers to achieve optimal medication adherence  Current pharmacy: [facility]  Interventions o Comprehensive medication review performed. o Utilize [facility] for medication synchronization, packaging and delivery  Patient self care activities - Over the next 3 months, patient will: o Report any questions or concerns to PharmD, Debbora Dus  Initial goal documentation       Diabetes   Estimated Creatinine Clearance: 22.8 mL/min (A) (by C-G formula based on SCr of 1.61 mg/dL (H)). Lab Results  Component Value Date   CREATININE 1.61 (H) 02/05/2020   BUN 33 (H) 02/05/2020   GFR 29.61 (L) 02/05/2020   GFRNONAA 23 (L) 11/30/2019   GFRAA 27 (L) 11/30/2019   NA 135 02/05/2020   K 4.4 02/05/2020   CALCIUM 9.9 02/05/2020   CO2 27 02/05/2020   Recent Relevant Labs: Lab Results  Component Value Date/Time   HGBA1C 8.0 (H) 02/05/2020 04:00 PM   HGBA1C 7.3 (A) 10/12/2019 11:51 AM   HGBA1C 6.9 (A) 07/11/2019 02:35 PM   HGBA1C 7.3 (H) 03/27/2019 03:11 PM   HGBA1C 6.8 01/13/2016 12:00 AM   MICROALBUR 2.7 (H) 09/19/2009 08:54 AM   MICROALBUR 0.6 01/08/2008 11:01 AM    Checking BG: once daily in the morning, occasionally throughout  the day (checked by [name]) Recent FBG Readings: last week: 200, 175, 165, 232, 188, 219, 202, 198 Denies hypoglycemia  Patient is currently uncontrolled on the following medications:   Glipizide 10 mg XL - 1 tablet daily  Not taking due to cost:  Insulin glargine - 10 units daily (PAP mailed to patient on 02/07/20)  Januvia 50 mg - 1 tablet daily (applied for PAP in August 2021 - need additional provider signature to resubmit form)  Previous therapies:  Metformin 1000 mg - 1 BID (d/c 10/11 due to CKD)  A1c goal < 7%  Microvascular complications:   No neuropathy or retinopathy  CKD stage 4  Last eye exam: 2019 - DUE  Last foot exam: 02/05/20   Macrovascular complications:   Denies: CAD, PVD, CVA  ACE-I/ARB: yes Statin: yes  Adherence: 15 day gap between refills, caregiver uses auto refill Exercise: none  We discussed: Option to complete PAP for Januvia and insulin  or consider trial of Trulicity instead if PCP approves. Caregiver denies history of pancreatitis or thyroid cancer. Reports patient has a good appetite. No abdominal issues.   Plan: Continue glipizide as prescribed. Consult PCP to consider Trulicity. Work with CCM team for patient assistance to reduce cost. Start adherence packaging.   Hypertension   CMP Latest Ref Rng & Units 02/05/2020 12/24/2019 12/07/2019  Glucose 70 - 99 mg/dL 235(H) 259(H) 166(H)  BUN 6 - 23 mg/dL 33(H) 39(H) 27(H)  Creatinine 0.40 - 1.20 mg/dL 1.61(H) 1.84(H) 2.01(H)  Sodium 135 - 145 mEq/L 135 136 137  Potassium 3.5 - 5.1 mEq/L 4.4 5.0 3.9  Chloride 96 - 112 mEq/L 99 103 102  CO2 19 - 32 mEq/L _0 Calcium 8.4 - 10.5 mg/dL 9.9 9.6 10.3  Total Protein 6.0 - 8.3 g/dL 6.5 - -  Total Bilirubin 0.2 - 1.2 mg/dL 0.3 - -  Alkaline Phos 39 - 117 U/L 70 - -  AST 0 - 37 U/L 20 - -  ALT 0 - 35 U/L 14 - -   Office blood pressures are: BP Readings from Last 3 Encounters:  02/05/20 132/60  12/07/19 130/64  11/30/19 (!) 137/50    BP goal < 140/90 mmHg Patient has failed these meds in the past: none Patient checks BP at home daily Patient home BP readings are ranging: 114/54, 169/70, 130/57, 144/67, 127/67, 140/64 BP monitoring: seated, legs uncrossed, arm cuff before breakfast   Patient is currently controlled on the following medications:   Amlodipine 10 mg - 1 tablet daily  Losartan/HCTZ 100-25 mg - 1/2 tablet daily (dose reduced to 1/2 tablet 10/11 due to CKD)  Metoprolol succinate 25 mg - 1 tablet daily  Plan: Continue current medications; Continue to monitor BP daily. CMA HTN assessment next month.   Hyperlipidemia   LDL goal < 100  Last lipids Lab Results  Component Value Date   CHOL 132 02/05/2020   HDL 40.80 02/05/2020   LDLCALC 54 02/05/2020   LDLDIRECT 59.0 06/09/2018   TRIG 182.0 (H) 02/05/2020   CHOLHDL 3 02/05/2020   Hepatic Function Latest Ref Rng & Units 02/05/2020 11/22/2019 03/27/2019  Total Protein 6.0 - 8.3 g/dL 6.5 7.0 6.8  Albumin 3.5 - 5.2 g/dL 3.2(L) 2.6(L) 3.8  AST 0 - 37 U/L _1 ALT 0 - 35 U/L _2 Alk Phosphatase 39 - 117 U/L 70 63 59  Total Bilirubin 0.2 - 1.2 mg/dL 0.3 0.5 0.4  Bilirubin, Direct 0.0 - 0.3 mg/dL - - -     The ASCVD Risk score (Ripley., et al., 2013) failed to calculate for the following reasons:   The 2013 ASCVD risk score is only valid for ages 102 to 52   Patient has failed these meds in past: none Patient is currently controlled on the following medications:   Simvastatin 20 mg - 1 tablet daily  We discussed:  Adherence < 5 day gap between refills   Plan: Continue current medications  Depression   Patient has failed these meds in past: none Patient is currently controlled on the following medications:   Fluoxetine 40 mg - 1 capsule daily  Bupropion 300 mg 24 hr - 1 tablet daily  We discussed: denies changes in mood, overall doing okay   Plan: Continue current medications  Osteopenia / Osteoporosis   Last DEXA  Scan: 05/2012, declines repeat scans   VITD  Date Value Ref Range Status  02/05/2020 62.13  30.00 - 100.00 ng/mL Final    Patient has failed these meds in past: none Patient is currently controlled on the following medications:   Raloxifene 60 mg - 1 tablet daily  We discussed:  Recommend [phone] units of vitamin D daily. Recommend 1200 mg of calcium daily from dietary and supplemental sources.  Plan: Continue current medications; Recommend starting vitamin D/calcium supplement.   Misc   Patient is currently on the following medications:   Pantoprazole 40 mg - 1 tablet daily (d/c after 30 days from hospital - no longer taking)   Aspirin 81 mg - 1 tablet daily   We discussed: confirms adherence to daily aspirin, no s/s of bleeding  Plan: Continue current medications; Remove pantoprazole from medication list.    Medication Management:     Pharmacy/Benefits: [facility], 90 day supply, auto-refill  Adherence: 15 day gap between refills, reports filling pillbox for patient and giving her the medications by hand  Affordability: denies concerns  Discussed [facility] coordination with [facility] including medication sync calls, adherence packaging, and delivery. She would like to utilize [facility] services. Coordinated with pharmacy to develop med-sync plan. Will add all maintenance medications and OTCs (mutlviitamin, vitamin D/calcium, and aspirin 81 mg) into packaging once medications are synced.   Verbal consent obtained for [facility] enhanced pharmacy services (medication synchronization, adherence packaging, delivery coordination). A medication sync plan was created to allow patient to get all medications delivered once every 30 to 90 days per patient preference. Patient understands they have freedom to choose pharmacy and clinical pharmacist will coordinate care between all prescribers and [facility].  CCM Follow Up: Med-sync call monthly, pharmacist telephone  visit in 3 months (05/08/20 at 9:15 AM)  Debbora Dus, PharmD Clinical Pharmacist [facility] [phone]

## 2020-02-07 NOTE — Chronic Care Management (AMB) (Signed)
  Chronic Care Management   Note  02/07/2020 Name: BILLIEJEAN SCHIMEK MRN: [id number] DOB: [birth year]  Jule Economy Dirr is a 84 y.o. year old female who is a primary care patient of [facility], Audrie Gallus, MD. I reached out to [facility] by phone today in response to a referral sent by Ms. Jule Economy Broman's PCP, [facility], Audrie Gallus, MD.   Ms. Munnerlyn was given information about Chronic Care Management services today including:  1. CCM service includes personalized support from designated clinical staff supervised by her physician, including individualized plan of care and coordination with other care providers 2. 24/7 contact phone numbers for assistance for urgent and routine care needs. 3. Service will only be billed when office clinical staff spend 20 minutes or more in a month to coordinate care. 4. Only one practitioner may furnish and bill the service in a calendar month. 5. The patient may stop CCM services at any time (effective at the end of the month) by phone call to the office staff.   Patient agreed to services and verbal consent obtained.   Follow up plan:   Aggie Hacker  [facility]

## 2020-02-07 NOTE — Chronic Care Management (AMB) (Signed)
Chronic Care Management Pharmacy Assistant   Name: SHERMAN LIPUMA  MRN: [id number] DOB: February 08, 1936  Reason for Encounter: Medication Review   Initial Questions Adherence Call.  Have you seen any other providers since your last visit? Yes 12/07/2019 Roxy Manns ,MD   Any changes in your medications or health? No, Patient was in hospital in August 20,2021  Any side effects from any medications? No.  Do you have an symptoms or problems not managed by your medications? No.  Any concerns about your health right now? Daughter is concerned about patient high blood sugars. States they run between 160- 232 fasting./ no blood sugars below 70's.  Has your provider asked that you check blood pressure, blood sugar, or follow special diet at home? Daughter states she takes her blood sugar every day fasting, and blood pressure daily runs between 139-64 - 169-70.   Do you get any type of exercise on a regular basis? No  Can you think of a goal you would like to reach for your health? Lower blood sugars.  Do you have any problems getting your medications? Yes, Patient is trying to get patient assistance for Januvia .  Is there anything that you would like to discuss during the appointment? No.  Patient is aware to have  medications and supplements available at time of phone visit.       PCP : Judy Pimple, MD  Allergies:   Allergies  Allergen Reactions   Alendronate Sodium     REACTION: heartburn    Medications: Outpatient Encounter Medications as of 02/07/2020  Medication Sig Note   amLODipine (NORVASC) 10 MG tablet TAKE 1 TABLET(10 MG) BY MOUTH DAILY    aspirin 81 MG EC tablet Take 81 mg by mouth daily.      buPROPion (WELLBUTRIN XL) 300 MG 24 hr tablet TAKE 1 TABLET(300 MG) BY MOUTH DAILY    cephALEXin (KEFLEX) 250 MG capsule Take 1 capsule (250 mg total) by mouth 2 (two) times daily. (Patient not taking: Reported on 02/05/2020)    fluconazole (DIFLUCAN) 100 MG tablet  Take one pill by mouth every other day for 3 doses (Patient not taking: Reported on 02/05/2020)    FLUoxetine (PROZAC) 40 MG capsule TAKE 1 CAPSULE(40 MG) BY MOUTH DAILY    glipiZIDE (GLUCOTROL XL) 10 MG 24 hr tablet TAKE 1 TABLET(10 MG) BY MOUTH DAILY    insulin glargine (LANTUS SOLOSTAR) 100 UNIT/ML Solostar Pen Inject 10 Units into the skin daily. (Patient not taking: Reported on 02/05/2020)    Insulin Pen Needle 29G X MISC Use with insulin daily (Patient not taking: Reported on 02/05/2020)    losartan-hydrochlorothiazide (HYZAAR) 100-25 MG tablet TAKE 1 TABLET BY MOUTH DAILY    metoprolol succinate (TOPROL-XL) 25 MG 24 hr tablet TAKE 1 TABLET(25 MG) BY MOUTH DAILY    pantoprazole (PROTONIX) 40 MG tablet Take 1 tablet (40 mg total) by mouth daily. (Patient not taking: Reported on 12/07/2019)    raloxifene (EVISTA) 60 MG tablet TAKE 1 TABLET(60 MG) BY MOUTH DAILY    simvastatin (ZOCOR) 20 MG tablet TAKE 1 TABLET(20 MG) BY MOUTH DAILY    sitaGLIPtin (JANUVIA) 50 MG tablet Take 50 mg by mouth daily. (Patient not taking: Reported on 02/05/2020) 11/23/2019: Pt. Has discontinued medication due to high cost.   No facility-administered encounter medications on file as of 02/07/2020.    Current Diagnosis: Patient Active Problem List   Diagnosis Date Noted   H/O sepsis 12/09/2019  AKI (acute kidney injury) (HCC) 11/23/2019   Leukocytosis 11/23/2019   Mobility impaired 10/13/2019   Incontinence of urine 10/12/2019   Grief reaction 03/27/2019   Elevated serum creatinine 06/13/2018   Dysuria 06/13/2018   Hearing loss 01/02/2018   Subclinical hypothyroidism 10/26/2017   Depression with anxiety 08/24/2016   Falls frequently 08/24/2016   Generalized weakness 08/24/2016   Routine general medical examination at a health care facility 05/16/2015   Breast calcifications on mammogram 03/01/2014   Encounter for Medicare annual wellness exam 05/30/2013   Breast calcification  seen on mammogram 12/14/2012   Breast calcification, left 11/28/2012   History of falling 11/27/2012   Poor balance 11/27/2012   Colon cancer screening 05/10/2012   Osteoporosis 06/21/2007   DM (diabetes mellitus), type 2, uncontrolled (HCC) 09/20/2006   Hyperlipidemia associated with type 2 diabetes mellitus (HCC) 09/20/2006   Essential hypertension 09/20/2006   Osteoarthritis 09/20/2006   02/07/2020 Called [employer] about patient's Januvia assistance form that has been faxed.  Spoke with Karena Addison states that patient had been denied and they mailed a Attestation form to patient for her to sign and send back with application, which they have not received. Daughter states that she does have form and application  and she can take back to providers office to be faxed again.   Follow-Up:  Pharmacist Review - Patient has hearing difficulties she put her daughter [name] on phone to help answer her health questions.  Phil Dopp, CPP, Notified   Jon Gills, [facility] Clinical Pharmacist Assistant [phone]

## 2020-02-08 ENCOUNTER — Other Ambulatory Visit: Payer: Self-pay

## 2020-02-08 ENCOUNTER — Ambulatory Visit: Payer: Self-pay

## 2020-02-08 ENCOUNTER — Telehealth: Payer: Self-pay | Admitting: *Deleted

## 2020-02-08 ENCOUNTER — Ambulatory Visit: Payer: PPO

## 2020-02-08 DIAGNOSIS — I10 Essential (primary) hypertension: Secondary | ICD-10-CM

## 2020-02-08 DIAGNOSIS — E1165 Type 2 diabetes mellitus with hyperglycemia: Secondary | ICD-10-CM

## 2020-02-08 NOTE — Telephone Encounter (Signed)
Left VM requesting pt's daughter [name] to call the office back

## 2020-02-08 NOTE — Patient Instructions (Addendum)
February 08, 2020  Dear Amanda Benson,  It was a pleasure meeting you during our initial appointment on February 08, 2020. Below is a summary of the goals we discussed and components of chronic care management. Please contact me anytime with questions or concerns.   Visit Information  Goals Addressed            This Visit's Progress   . Pharmacy Care Plan       CARE PLAN ENTRY  Current Barriers:  . Chronic Disease Management support, education, and care coordination needs related to Hypertension and Diabetes   Hypertension BP Readings from Last 3 Encounters:  02/05/20 132/60  12/07/19 130/64  11/30/19 (!) 137/50 .  Pharmacist Clinical Goal(s): o Over the next 3 months, patient will work with PharmD and providers to maintain BP goal <140/90 . Current regimen:   Amlodipine 10 mg - 1 tablet daily  Losartan/HCTZ 100-25 mg - 1/2 tablet daily  Metoprolol succinate 25 mg - 1 tablet daily . Interventions: o Review home blood pressure log  . Patient self care activities - Over the next 3 months, patient will: o Check BP daily, document, and provide at future appointments o Ensure daily salt intake < 2300 mg/day  Diabetes Lab Results  Component Value Date/Time   HGBA1C 8.0 (H) 02/05/2020 04:00 PM   HGBA1C 7.3 (A) 10/12/2019 11:51 AM   HGBA1C 6.9 (A) 07/11/2019 02:35 PM   HGBA1C 7.3 (H) 03/27/2019 03:11 PM   HGBA1C 6.8 01/13/2016 12:00 AM   . Pharmacist Clinical Goal(s): o Over the next 3 months, patient will work with PharmD and providers to achieve A1c goal <7% . Current regimen:  o Glipizide 10 mg XL - 1 tablet daily . Interventions: o Assist with patient cost barriers . Patient self care activities - Over the next 3 months, patient will: o Check blood sugar before breakfast, document, and provide at future appointments o Contact provider with any episodes of hypoglycemia  Medication management . Pharmacist Clinical Goal(s): o Over the next 3 months, patient will  work with PharmD and providers to achieve optimal medication adherence . Current pharmacy: [facility] . Interventions o Comprehensive medication review performed. o Utilize [facility] for medication synchronization, packaging and delivery . Patient self care activities - Over the next 3 months, patient will: o Report any questions or concerns to PharmD, Debbora Dus  Initial goal documentation       Amanda Benson was given information about Chronic Care Management services today including:  1. CCM service includes personalized support from designated clinical staff supervised by her physician, including individualized plan of care and coordination with other care providers 2. 24/7 contact phone numbers for assistance for urgent and routine care needs. 3. Standard insurance, coinsurance, copays and deductibles apply for chronic care management only during months in which we provide at least 20 minutes of these services. Most insurances cover these services at 100%, however patients may be responsible for any copay, coinsurance and/or deductible if applicable. This service may help you avoid the need for more expensive face-to-face services. 4. Only one practitioner may furnish and bill the service in a calendar month. 5. The patient may stop CCM services at any time (effective at the end of the month) by phone call to the office staff.  Patient agreed to services and verbal consent obtained.   Patient verbalizes understanding of instructions provided today.  Telephone follow up appointment with pharmacy team member scheduled for: 05/08/20 at 9:15 AM (telephone)  Debbora Dus, PharmD Clinical Pharmacist [facility] at [facility] [phone]   Hypoglycemia Hypoglycemia is when the sugar (glucose) level in your blood is too low. Signs of low blood sugar may include:  Feeling: ? Hungry. ? Worried or nervous (anxious). ? Sweaty and  clammy. ? Confused. ? Dizzy. ? Sleepy. ? Sick to your stomach (nauseous).  Having: ? A fast heartbeat. ? A headache. ? A change in your vision. ? Tingling or no feeling (numbness) around your mouth, lips, or tongue. ? Jerky movements that you cannot control (seizure).  Having trouble with: ? Moving (coordination). ? Sleeping. ? Passing out (fainting). ? Getting upset easily (irritability). Low blood sugar can happen to people who have diabetes and people who do not have diabetes. Low blood sugar can happen quickly, and it can be an emergency. Treating low blood sugar Low blood sugar is often treated by eating or drinking something sugary right away, such as:  Fruit juice, 4-6 oz (120-150 mL).  Regular soda (not diet soda), 4-6 oz (120-150 mL).  Low-fat milk, 4 oz (120 mL).  Several pieces of hard candy.  Sugar or honey, 1 Tbsp (15 mL). Treating low blood sugar if you have diabetes If you can think clearly and swallow safely, follow the 15:15 rule:  Take 15 grams of a fast-acting carb (carbohydrate). Talk with your doctor about how much you should take.  Always keep a source of fast-acting carb with you, such as: ? Sugar tablets (glucose pills). Take 3-4 pills. ? 6-8 pieces of hard candy. ? 4-6 oz (120-150 mL) of fruit juice. ? 4-6 oz (120-150 mL) of regular (not diet) soda. ? 1 Tbsp (15 mL) honey or sugar.  Check your blood sugar 15 minutes after you take the carb.  If your blood sugar is still at or below 70 mg/dL (3.9 mmol/L), take 15 grams of a carb again.  If your blood sugar does not go above 70 mg/dL (3.9 mmol/L) after 3 tries, get help right away.  After your blood sugar goes back to normal, eat a meal or a snack within 1 hour.  Treating very low blood sugar If your blood sugar is at or below 54 mg/dL (3 mmol/L), you have very low blood sugar (severe hypoglycemia). This may also cause:  Passing out.  Jerky movements you cannot control  (seizure).  Losing consciousness (coma). This is an emergency. Do not wait to see if the symptoms will go away. Get medical help right away. Call your local emergency services (911 in the [place] Do not drive yourself to the hospital. If you have very low blood sugar and you cannot eat or drink, you may need a glucagon shot (injection). A family member or friend should learn how to check your blood sugar and how to give you a glucagon shot. Ask your doctor if you need to have a glucagon shot kit at home. Follow these instructions at home: General instructions  Take over-the-counter and prescription medicines only as told by your doctor.  Stay aware of your blood sugar as told by your doctor.  Limit alcohol intake to no more than 1 drink a day for nonpregnant women and 2 drinks a day for men. One drink equals 12 oz of beer (355 mL), 5 oz of wine (148 mL), or 1 oz of hard liquor (44 mL).  Keep all follow-up visits as told by your doctor. This is important. If you have diabetes:   Follow your diabetes care plan as  told by your doctor. Make sure you: ? Know the signs of low blood sugar. ? Take your medicines as told. ? Follow your exercise and meal plan. ? Eat on time. Do not skip meals. ? Check your blood sugar as often as told by your doctor. Always check it before and after exercise. ? Follow your sick day plan when you cannot eat or drink normally. Make this plan ahead of time with your doctor.  Share your diabetes care plan with: ? Your work or school. ? People you live with.  Check your pee (urine) for ketones: ? When you are sick. ? As told by your doctor.  Carry a card or wear jewelry that says you have diabetes. Contact a doctor if:  You have trouble keeping your blood sugar in your target range.  You have low blood sugar often. Get help right away if:  You still have symptoms after you eat or drink something sugary.  Your blood sugar is at or below 54 mg/dL (3  mmol/L).  You have jerky movements that you cannot control.  You pass out. These symptoms may be an emergency. Do not wait to see if the symptoms will go away. Get medical help right away. Call your local emergency services (911 in the [place] Do not drive yourself to the hospital. Summary  Hypoglycemia happens when the level of sugar (glucose) in your blood is too low.  Low blood sugar can happen to people who have diabetes and people who do not have diabetes. Low blood sugar can happen quickly, and it can be an emergency.  Make sure you know the signs of low blood sugar and know how to treat it.  Always keep a source of sugar (fast-acting carb) with you to treat low blood sugar. This information is not intended to replace advice given to you by your health care provider. Make sure you discuss any questions you have with your health care provider. Document Revised: 07/13/2018 Document Reviewed: 04/25/2015 Elsevier Patient Education  2020 [employer].

## 2020-02-08 NOTE — Chronic Care Management (AMB) (Signed)
Spoke with patient's daughter today for initial CCM visit. I'm thinking she would be a good candidate for Ozempic. She is only taking glipizide at present with fasting BG ranging 170-210. Lantus and Januvia on hold due to cost concerns. Denies history of pancreatitis or family history of thyroid cancer. Reports having a good appetite, no weight loss concerns, BMI 24. Would be easier on caregiver to give a weekly injection rather than daily. If you agree, we can start the patient assistance application for Ozempic and hold off on sending the applications for Januvia and Lantus. Continue glipizide with close follow up of BG monitoring.   Thanks,   Phil Dopp, PharmD Clinical Pharmacist [facility] at [facility] [phone]

## 2020-02-08 NOTE — Telephone Encounter (Signed)
-----   Message from Judy Pimple, MD sent at 02/06/2020  9:36 PM EDT ----- Blood count is fairly stable  Cr is back up to 1.6 , I would like to refer her to a kidney specialist (please keep drinking water) let me know if agreeable A1c is way up to 8.0 (expected)   please bring the januvia paperwork back in and I will sign it  I also reached out to pharmacist re lantus to see if pt assist is available  Thyroid labs are ok  F/u in 3 mo

## 2020-02-10 NOTE — Progress Notes (Addendum)
Sorry , I meant 0.25 mg weekly to start  When ready to increase dose -will go up to 0.5   Thanks

## 2020-02-10 NOTE — Progress Notes (Signed)
I have collaborated with the care management provider regarding care management and care coordination activities outlined in this encounter and have reviewed this encounter including documentation in the note and care plan. I am certifying that I agree with the content of this note and encounter as supervising physician. Khrystina Bonnes MD  

## 2020-02-11 MED ORDER — OZEMPIC (0.25 OR 0.5 MG/DOSE) 2 MG/1.5ML ~~LOC~~ SOPN: 0.2500 mg | PEN_INJECTOR | SUBCUTANEOUS | 3 refills | Status: DC

## 2020-02-11 NOTE — Chronic Care Management (AMB) (Signed)
Discussed with Dr. Milinda Antis and patient is going to start Ozempic 3 mg. Please let patient know she can hold off on bringing the Januvia paperwork for now. We will be mailing her the Ozempic paperwork instead.  Ignatius Specking, PharmD Clinical Pharmacist [facility] at [facility] [phone]

## 2020-02-11 NOTE — Addendum Note (Signed)
Addended by: Roxy Manns A on: 02/11/2020 08:09 PM   Modules accepted: Orders

## 2020-02-11 NOTE — Chronic Care Management (AMB) (Signed)
Yes, Ozempic 3 mg is perfect. Please send to [facility]. She most likely wont be able to afford until we complete the paperwork for PAP. It would be good to have the prescription on her profile though.  Thanks!  Marcelino Duster

## 2020-02-12 ENCOUNTER — Telehealth: Payer: Self-pay

## 2020-02-12 DIAGNOSIS — E1110 Type 2 diabetes mellitus with ketoacidosis without coma: Secondary | ICD-10-CM | POA: Diagnosis not present

## 2020-02-12 DIAGNOSIS — Z9181 History of falling: Secondary | ICD-10-CM | POA: Diagnosis not present

## 2020-02-12 DIAGNOSIS — Z741 Need for assistance with personal care: Secondary | ICD-10-CM | POA: Diagnosis not present

## 2020-02-12 NOTE — Chronic Care Management (AMB) (Addendum)
Chronic Care Management Pharmacy Assistant   Name: Amanda Benson  MRN: [id number] DOB: [birth year]  Reason for Encounter: Medication Review - Patient Assistance Coordination.   PCP : Judy Pimple, MD  Allergies:   Allergies  Allergen Reactions   Alendronate Sodium     REACTION: heartburn    Medications: Outpatient Encounter Medications as of 02/12/2020  Medication Sig Note   amLODipine (NORVASC) 10 MG tablet TAKE 1 TABLET(10 MG) BY MOUTH DAILY    aspirin 81 MG EC tablet Take 81 mg by mouth daily.      buPROPion (WELLBUTRIN XL) 300 MG 24 hr tablet TAKE 1 TABLET(300 MG) BY MOUTH DAILY    cephALEXin (KEFLEX) 250 MG capsule Take 1 capsule (250 mg total) by mouth 2 (two) times daily. (Patient not taking: Reported on 02/05/2020)    fluconazole (DIFLUCAN) 100 MG tablet Take one pill by mouth every other day for 3 doses (Patient not taking: Reported on 02/05/2020)    FLUoxetine (PROZAC) 40 MG capsule TAKE 1 CAPSULE(40 MG) BY MOUTH DAILY    glipiZIDE (GLUCOTROL XL) 10 MG 24 hr tablet TAKE 1 TABLET(10 MG) BY MOUTH DAILY    insulin glargine (LANTUS SOLOSTAR) 100 UNIT/ML Solostar Pen Inject 10 Units into the skin daily. (Patient not taking: Reported on 02/05/2020)    Insulin Pen Needle 29G X MISC Use with insulin daily (Patient not taking: Reported on 02/05/2020)    losartan-hydrochlorothiazide (HYZAAR) 100-25 MG tablet TAKE 1 TABLET BY MOUTH DAILY (Patient taking differently: Take 0.5 tablets by mouth daily. 1/2 tablet daily)    metoprolol succinate (TOPROL-XL) 25 MG 24 hr tablet TAKE 1 TABLET(25 MG) BY MOUTH DAILY    pantoprazole (PROTONIX) 40 MG tablet Take 1 tablet (40 mg total) by mouth daily. (Patient not taking: Reported on 12/07/2019)    raloxifene (EVISTA) 60 MG tablet TAKE 1 TABLET(60 MG) BY MOUTH DAILY    Semaglutide,0.25 or 0.5MG /DOS, (OZEMPIC, 0.25 OR 0.5 MG/DOSE,) 2 MG/1.5ML SOPN Inject 0.25 mg into the skin once a week.    simvastatin (ZOCOR) 20 MG tablet TAKE 1 TABLET(20 MG)  BY MOUTH DAILY    sitaGLIPtin (JANUVIA) 50 MG tablet Take 50 mg by mouth daily. (Patient not taking: Reported on 02/05/2020) 11/23/2019: Pt. Has discontinued medication due to high cost.   No facility-administered encounter medications on file as of 02/12/2020.    Current Diagnosis: Patient Active Problem List   Diagnosis Date Noted   H/O sepsis 12/09/2019   AKI (acute kidney injury) (HCC) 11/23/2019   Leukocytosis 11/23/2019   Mobility impaired 10/13/2019   Incontinence of urine 10/12/2019   Grief reaction 03/27/2019   Elevated serum creatinine 06/13/2018   Dysuria 06/13/2018   Hearing loss 01/02/2018   Subclinical hypothyroidism 10/26/2017   Depression with anxiety 08/24/2016   Falls frequently 08/24/2016   Generalized weakness 08/24/2016   Routine general medical examination at a health care facility 05/16/2015   Breast calcifications on mammogram 03/01/2014   Encounter for Medicare annual wellness exam 05/30/2013   Breast calcification seen on mammogram 12/14/2012   Breast calcification, left 11/28/2012   History of falling 11/27/2012   Poor balance 11/27/2012   Colon cancer screening 05/10/2012   Osteoporosis 06/21/2007   DM (diabetes mellitus), type 2, uncontrolled (HCC) 09/20/2006   Hyperlipidemia associated with type 2 diabetes mellitus (HCC) 09/20/2006   Essential hypertension 09/20/2006   Osteoarthritis 09/20/2006    Follow-Up:  Patient Assistance Coordination -  New Application filled out to [employer] for [employer] .  Awaiting provider signature/ mailed to patient to filled out and bring completed form with proof of income to providers office to fax.  Phil Dopp, CPP notified  Jon Gills, [facility] Clinical Pharmacist Assistant [phone]  I have reviewed the care management and care coordination activities outlined in this encounter and I am certifying that I agree with the content of this note. No further action required.  Phil Dopp,  PharmD Clinical Pharmacist  Primary Care at [facility] [phone]

## 2020-03-04 ENCOUNTER — Telehealth: Payer: Self-pay | Admitting: Family Medicine

## 2020-03-04 NOTE — Telephone Encounter (Signed)
Patient's daughter called in and stated forms are needing to be filled out and faxed. Please advise. Placed in tower.

## 2020-03-04 NOTE — Telephone Encounter (Signed)
Forms in your inbox

## 2020-03-11 ENCOUNTER — Telehealth: Payer: Self-pay

## 2020-03-11 NOTE — Chronic Care Management (AMB) (Signed)
Chronic Care Management Pharmacy Assistant   Name: Amanda Benson  MRN: [id number] DOB: 1935-09-19  Reason for Encounter: Medication Review, PAP     PCP : Judy Pimple, MD  Allergies:   Allergies  Allergen Reactions  . Alendronate Sodium     REACTION: heartburn    Medications: Outpatient Encounter Medications as of 03/11/2020  Medication Sig Note  . amLODipine (NORVASC) 10 MG tablet TAKE 1 TABLET(10 MG) BY MOUTH DAILY   . aspirin 81 MG EC tablet Take 81 mg by mouth daily.     Marland Kitchen buPROPion (WELLBUTRIN XL) 300 MG 24 hr tablet TAKE 1 TABLET(300 MG) BY MOUTH DAILY   . cephALEXin (KEFLEX) 250 MG capsule Take 1 capsule (250 mg total) by mouth 2 (two) times daily. (Patient not taking: Reported on 02/05/2020)   . fluconazole (DIFLUCAN) 100 MG tablet Take one pill by mouth every other day for 3 doses (Patient not taking: Reported on 02/05/2020)   . FLUoxetine (PROZAC) 40 MG capsule TAKE 1 CAPSULE(40 MG) BY MOUTH DAILY   . glipiZIDE (GLUCOTROL XL) 10 MG 24 hr tablet TAKE 1 TABLET(10 MG) BY MOUTH DAILY   . insulin glargine (LANTUS SOLOSTAR) 100 UNIT/ML Solostar Pen Inject 10 Units into the skin daily. (Patient not taking: Reported on 02/05/2020)   . Insulin Pen Needle 29G X MISC Use with insulin daily (Patient not taking: Reported on 02/05/2020)   . losartan-hydrochlorothiazide (HYZAAR) 100-25 MG tablet TAKE 1 TABLET BY MOUTH DAILY (Patient taking differently: Take 0.5 tablets by mouth daily. 1/2 tablet daily)   . metoprolol succinate (TOPROL-XL) 25 MG 24 hr tablet TAKE 1 TABLET(25 MG) BY MOUTH DAILY   . pantoprazole (PROTONIX) 40 MG tablet Take 1 tablet (40 mg total) by mouth daily. (Patient not taking: Reported on 12/07/2019)   . raloxifene (EVISTA) 60 MG tablet TAKE 1 TABLET(60 MG) BY MOUTH DAILY   . Semaglutide,0.25 or 0.5MG /DOS, (OZEMPIC, 0.25 OR 0.5 MG/DOSE,) 2 MG/1.5ML SOPN Inject 0.25 mg into the skin once a week.   . simvastatin (ZOCOR) 20 MG tablet TAKE 1 TABLET(20 MG) BY MOUTH  DAILY   . sitaGLIPtin (JANUVIA) 50 MG tablet Take 50 mg by mouth daily. (Patient not taking: Reported on 02/05/2020) 11/23/2019: Pt. Has discontinued medication due to high cost.   No facility-administered encounter medications on file as of 03/11/2020.    Current Diagnosis: Patient Active Problem List   Diagnosis Date Noted  . H/O sepsis 12/09/2019  . AKI (acute kidney injury) (HCC) 11/23/2019  . Leukocytosis 11/23/2019  . Mobility impaired 10/13/2019  . Incontinence of urine 10/12/2019  . Grief reaction 03/27/2019  . Elevated serum creatinine 06/13/2018  . Dysuria 06/13/2018  . Hearing loss 01/02/2018  . Subclinical hypothyroidism 10/26/2017  . Depression with anxiety 08/24/2016  . Falls frequently 08/24/2016  . Generalized weakness 08/24/2016  . Routine general medical examination at a health care facility 05/16/2015  . Breast calcifications on mammogram 03/01/2014  . Encounter for Medicare annual wellness exam 05/30/2013  . Breast calcification seen on mammogram 12/14/2012  . Breast calcification, left 11/28/2012  . History of falling 11/27/2012  . Poor balance 11/27/2012  . Colon cancer screening 05/10/2012  . Osteoporosis 06/21/2007  . DM (diabetes mellitus), type 2, uncontrolled (HCC) 09/20/2006  . Hyperlipidemia associated with type 2 diabetes mellitus (HCC) 09/20/2006  . Essential hypertension 09/20/2006  . Osteoarthritis 09/20/2006   Patient's daughter [name] was contacted regarding Ozempic PAP form that was mailed to her. She states she did  not receive that form but did get [employer] and has filled it out and mailed it back to Dr. Royden Purl office.    Follow-Up:  Patient Assistance Coordination and Pharmacist Review   Phil Dopp, CPP notified  Jomarie Longs, [facility] Clinical Pharmacy Assistant [phone]

## 2020-03-13 ENCOUNTER — Telehealth: Payer: Self-pay

## 2020-03-13 NOTE — Telephone Encounter (Signed)
Checking to see what patient assistance applications are in progress for [employer]. The patient's daughter reports mailing in a [employer] application.   Thanks,  Phil Dopp, PharmD Clinical Pharmacist [facility] at [facility] [phone]

## 2020-03-17 NOTE — Telephone Encounter (Signed)
Thanks for letting me know - - I still have her lantus form and will hold it

## 2020-04-02 ENCOUNTER — Telehealth: Payer: Self-pay

## 2020-04-02 NOTE — Chronic Care Management (AMB) (Addendum)
Chronic Care Management Pharmacy Assistant   Name: Amanda Benson  MRN: [id number] DOB: [birth year]  Reason for Encounter: Medication Delivery and Coordination Call  Attempted to contact patient for upcoming delivery of medications. Left voicemail with contact information.   PCP : Judy Pimple, MD  Allergies:   Allergies  Allergen Reactions   Alendronate Sodium     REACTION: heartburn    Medications: Outpatient Encounter Medications as of 04/02/2020  Medication Sig Note   amLODipine (NORVASC) 10 MG tablet TAKE 1 TABLET(10 MG) BY MOUTH DAILY    aspirin 81 MG EC tablet Take 81 mg by mouth daily.      buPROPion (WELLBUTRIN XL) 300 MG 24 hr tablet TAKE 1 TABLET(300 MG) BY MOUTH DAILY    cephALEXin (KEFLEX) 250 MG capsule Take 1 capsule (250 mg total) by mouth 2 (two) times daily. (Patient not taking: Reported on 02/05/2020)    fluconazole (DIFLUCAN) 100 MG tablet Take one pill by mouth every other day for 3 doses (Patient not taking: Reported on 02/05/2020)    FLUoxetine (PROZAC) 40 MG capsule TAKE 1 CAPSULE(40 MG) BY MOUTH DAILY    glipiZIDE (GLUCOTROL XL) 10 MG 24 hr tablet TAKE 1 TABLET(10 MG) BY MOUTH DAILY    insulin glargine (LANTUS SOLOSTAR) 100 UNIT/ML Solostar Pen Inject 10 Units into the skin daily. (Patient not taking: Reported on 02/05/2020)    Insulin Pen Needle 29G X MISC Use with insulin daily (Patient not taking: Reported on 02/05/2020)    losartan-hydrochlorothiazide (HYZAAR) 100-25 MG tablet TAKE 1 TABLET BY MOUTH DAILY (Patient taking differently: Take 0.5 tablets by mouth daily. 1/2 tablet daily)    metoprolol succinate (TOPROL-XL) 25 MG 24 hr tablet TAKE 1 TABLET(25 MG) BY MOUTH DAILY    pantoprazole (PROTONIX) 40 MG tablet Take 1 tablet (40 mg total) by mouth daily. (Patient not taking: Reported on 12/07/2019)    raloxifene (EVISTA) 60 MG tablet TAKE 1 TABLET(60 MG) BY MOUTH DAILY    Semaglutide,0.25 or 0.5MG /DOS, (OZEMPIC, 0.25 OR 0.5 MG/DOSE,) 2 MG/1.5ML SOPN  Inject 0.25 mg into the skin once a week.    simvastatin (ZOCOR) 20 MG tablet TAKE 1 TABLET(20 MG) BY MOUTH DAILY    sitaGLIPtin (JANUVIA) 50 MG tablet Take 50 mg by mouth daily. (Patient not taking: Reported on 02/05/2020) 11/23/2019: Pt. Has discontinued medication due to high cost.   No facility-administered encounter medications on file as of 04/02/2020.    Current Diagnosis: Patient Active Problem List   Diagnosis Date Noted   H/O sepsis 12/09/2019   AKI (acute kidney injury) (HCC) 11/23/2019   Leukocytosis 11/23/2019   Mobility impaired 10/13/2019   Incontinence of urine 10/12/2019   Grief reaction 03/27/2019   Elevated serum creatinine 06/13/2018   Dysuria 06/13/2018   Hearing loss 01/02/2018   Subclinical hypothyroidism 10/26/2017   Depression with anxiety 08/24/2016   Falls frequently 08/24/2016   Generalized weakness 08/24/2016   Routine general medical examination at a health care facility 05/16/2015   Breast calcifications on mammogram 03/01/2014   Encounter for Medicare annual wellness exam 05/30/2013   Breast calcification seen on mammogram 12/14/2012   Breast calcification, left 11/28/2012   History of falling 11/27/2012   Poor balance 11/27/2012   Colon cancer screening 05/10/2012   Osteoporosis 06/21/2007   DM (diabetes mellitus), type 2, uncontrolled (HCC) 09/20/2006   Hyperlipidemia associated with type 2 diabetes mellitus (HCC) 09/20/2006   Essential hypertension 09/20/2006   Osteoarthritis 09/20/2006   Follow-Up:  Coordination  of Adult nurse and Pharmacist Review   Phil Dopp, CPP notified  Jomarie Longs, [facility] Clinical Pharmacy Assistant [phone]

## 2020-04-03 ENCOUNTER — Telehealth: Payer: Self-pay

## 2020-04-03 NOTE — Chronic Care Management (AMB) (Addendum)
Chronic Care Management Pharmacy Assistant   Name: Amanda Benson  MRN: [id number] DOB: 08/10/1935  Reason for Encounter: Medication Delivery and Coordination Call   Patient Questions:  1.  Have you seen any other providers since your last visit? No  2.  Any changes in your medicines or health? No  PCP : Tower, Audrie Gallus, MD  Allergies:   Allergies  Allergen Reactions   Alendronate Sodium     REACTION: heartburn    Medications: Outpatient Encounter Medications as of 04/03/2020  Medication Sig Note   amLODipine (NORVASC) 10 MG tablet TAKE 1 TABLET(10 MG) BY MOUTH DAILY    aspirin 81 MG EC tablet Take 81 mg by mouth daily.      buPROPion (WELLBUTRIN XL) 300 MG 24 hr tablet TAKE 1 TABLET(300 MG) BY MOUTH DAILY    cephALEXin (KEFLEX) 250 MG capsule Take 1 capsule (250 mg total) by mouth 2 (two) times daily. (Patient not taking: Reported on 02/05/2020)    fluconazole (DIFLUCAN) 100 MG tablet Take one pill by mouth every other day for 3 doses (Patient not taking: Reported on 02/05/2020)    FLUoxetine (PROZAC) 40 MG capsule TAKE 1 CAPSULE(40 MG) BY MOUTH DAILY    glipiZIDE (GLUCOTROL XL) 10 MG 24 hr tablet TAKE 1 TABLET(10 MG) BY MOUTH DAILY    insulin glargine (LANTUS SOLOSTAR) 100 UNIT/ML Solostar Pen Inject 10 Units into the skin daily. (Patient not taking: Reported on 02/05/2020)    Insulin Pen Needle 29G X MISC Use with insulin daily (Patient not taking: Reported on 02/05/2020)    losartan-hydrochlorothiazide (HYZAAR) 100-25 MG tablet TAKE 1 TABLET BY MOUTH DAILY (Patient taking differently: Take 0.5 tablets by mouth daily. 1/2 tablet daily)    metoprolol succinate (TOPROL-XL) 25 MG 24 hr tablet TAKE 1 TABLET(25 MG) BY MOUTH DAILY    pantoprazole (PROTONIX) 40 MG tablet Take 1 tablet (40 mg total) by mouth daily. (Patient not taking: Reported on 12/07/2019)    raloxifene (EVISTA) 60 MG tablet TAKE 1 TABLET(60 MG) BY MOUTH DAILY    Semaglutide,0.25 or 0.5MG /DOS, (OZEMPIC, 0.25 OR  0.5 MG/DOSE,) 2 MG/1.5ML SOPN Inject 0.25 mg into the skin once a week.    simvastatin (ZOCOR) 20 MG tablet TAKE 1 TABLET(20 MG) BY MOUTH DAILY    sitaGLIPtin (JANUVIA) 50 MG tablet Take 50 mg by mouth daily. (Patient not taking: Reported on 02/05/2020) 11/23/2019: Pt. Has discontinued medication due to high cost.   No facility-administered encounter medications on file as of 04/03/2020.    Current Diagnosis: Patient Active Problem List   Diagnosis Date Noted   H/O sepsis 12/09/2019   AKI (acute kidney injury) (HCC) 11/23/2019   Leukocytosis 11/23/2019   Mobility impaired 10/13/2019   Incontinence of urine 10/12/2019   Grief reaction 03/27/2019   Elevated serum creatinine 06/13/2018   Dysuria 06/13/2018   Hearing loss 01/02/2018   Subclinical hypothyroidism 10/26/2017   Depression with anxiety 08/24/2016   Falls frequently 08/24/2016   Generalized weakness 08/24/2016   Routine general medical examination at a health care facility 05/16/2015   Breast calcifications on mammogram 03/01/2014   Encounter for Medicare annual wellness exam 05/30/2013   Breast calcification seen on mammogram 12/14/2012   Breast calcification, left 11/28/2012   History of falling 11/27/2012   Poor balance 11/27/2012   Colon cancer screening 05/10/2012   Osteoporosis 06/21/2007   DM (diabetes mellitus), type 2, uncontrolled (HCC) 09/20/2006   Hyperlipidemia associated with type 2 diabetes mellitus (HCC) 09/20/2006  Essential hypertension 09/20/2006   Osteoarthritis 09/20/2006   Reviewed chart for medication changes ahead of medication coordination call.  No OVs, Consults, or hospital visits since last care coordination call/Pharmacist visit.   No medication changes indicated.  BP Readings from Last 3 Encounters:  02/05/20 132/60  12/07/19 130/64  11/30/19 (!) 137/50    Lab Results  Component Value Date   HGBA1C 8.0 (H) 02/05/2020    Patient obtains medications through Adherence Packaging  30  Days   Last adherence delivery included: 63 day supply sent to sync meds to 04/15/2020 Raloxifene 60mg -1 tablet daily Simvastatin 20mg -1 tablet daily  Bupropion 300mg  24 hr-1 tablet daily  Calcium/Vitamin D (800 units/600mg )- 1 tablet daily   Patient declined the following medications last month:  All other maintenance medications - patient had enough to last until 04/15/2020  Patient is due for next adherence delivery on: 04/11/2020   Called patient and reviewed medications and coordinated delivery.  This delivery to include: Multivitamin (Women's One A Day) - 1 tablet daily (1 breakfast)  Amlodipine 10mg - 1 tablet daily (1 breakfast) Aspirin 81mg -1 tablet daily (1 evening meal)  Bupropion 300mg  24 hr-1 tablet daily (1 breakfast)  Calcium/Vitamin D (600 mg/800 units)- 1 tablet daily (1 evening meal)  Fluoxetine 40mg  - 1 capsule daily (1 breakfast)  Glipizide 10mg  XL - 1 tablet daily (1 breakfast)  Losartan/HCTZ 100-25mg -   tablet daily(  breakfast)  Metoprolol Succinate 25mg - 1 tablet daily (1 evening meal)  Raloxifene 60mg -1 tablet daily (1 breakfast)  Simvastatin 20mg -1 tablet daily (1 evening meal)  Patient declined the following medications: none   Patient needs refills for Losartan/HCTZ 100-25mg , raloxifene 60mg , simvastatin 20 mg, bupropion 300 mg from Dr. . 06/13/2020 requested per staff message 04/05/2020.  Confirmed delivery date of 04/11/2020, advised patient that pharmacy will contact them the morning of delivery.  Follow-Up:  Coordination of Enhanced Pharmacy Services and Pharmacist Review   , CPP notified  , [facility] Clinical Pharmacy Assistant [phone]  I have reviewed the care management and care coordination activities outlined in this encounter and I am certifying that I agree with the content of this note. No further action required.  , PharmD Clinical Pharmacist [facility] at [facility] [phone]

## 2020-04-03 NOTE — Chronic Care Management (AMB) (Addendum)
Chronic Care Management Pharmacy Assistant   Name: Amanda Benson  MRN: [id number] DOB: Jul 30, 1935  Reason for Encounter: Patient assistance.   Contacted [employer] to follow up on patients [employer] application. They state they do not have application. When daughter contacted she states she dropped it off at Dr. Royden Purl office several weeks ago. Phil Dopp notified.   PCP : Judy Pimple, MD  Allergies:   Allergies  Allergen Reactions   Alendronate Sodium     REACTION: heartburn    Medications: Outpatient Encounter Medications as of 04/03/2020  Medication Sig Note   amLODipine (NORVASC) 10 MG tablet TAKE 1 TABLET(10 MG) BY MOUTH DAILY    aspirin 81 MG EC tablet Take 81 mg by mouth daily.      buPROPion (WELLBUTRIN XL) 300 MG 24 hr tablet TAKE 1 TABLET(300 MG) BY MOUTH DAILY    cephALEXin (KEFLEX) 250 MG capsule Take 1 capsule (250 mg total) by mouth 2 (two) times daily. (Patient not taking: Reported on 02/05/2020)    fluconazole (DIFLUCAN) 100 MG tablet Take one pill by mouth every other day for 3 doses (Patient not taking: Reported on 02/05/2020)    FLUoxetine (PROZAC) 40 MG capsule TAKE 1 CAPSULE(40 MG) BY MOUTH DAILY    glipiZIDE (GLUCOTROL XL) 10 MG 24 hr tablet TAKE 1 TABLET(10 MG) BY MOUTH DAILY    insulin glargine (LANTUS SOLOSTAR) 100 UNIT/ML Solostar Pen Inject 10 Units into the skin daily. (Patient not taking: Reported on 02/05/2020)    Insulin Pen Needle 29G X MISC Use with insulin daily (Patient not taking: Reported on 02/05/2020)    losartan-hydrochlorothiazide (HYZAAR) 100-25 MG tablet TAKE 1 TABLET BY MOUTH DAILY (Patient taking differently: Take 0.5 tablets by mouth daily. 1/2 tablet daily)    metoprolol succinate (TOPROL-XL) 25 MG 24 hr tablet TAKE 1 TABLET(25 MG) BY MOUTH DAILY    pantoprazole (PROTONIX) 40 MG tablet Take 1 tablet (40 mg total) by mouth daily. (Patient not taking: Reported on 12/07/2019)    raloxifene (EVISTA) 60 MG tablet TAKE 1 TABLET(60  MG) BY MOUTH DAILY    Semaglutide,0.25 or 0.5MG /DOS, (OZEMPIC, 0.25 OR 0.5 MG/DOSE,) 2 MG/1.5ML SOPN Inject 0.25 mg into the skin once a week.    simvastatin (ZOCOR) 20 MG tablet TAKE 1 TABLET(20 MG) BY MOUTH DAILY    sitaGLIPtin (JANUVIA) 50 MG tablet Take 50 mg by mouth daily. (Patient not taking: Reported on 02/05/2020) 11/23/2019: Pt. Has discontinued medication due to high cost.   No facility-administered encounter medications on file as of 04/03/2020.    Current Diagnosis: Patient Active Problem List   Diagnosis Date Noted   H/O sepsis 12/09/2019   AKI (acute kidney injury) (HCC) 11/23/2019   Leukocytosis 11/23/2019   Mobility impaired 10/13/2019   Incontinence of urine 10/12/2019   Grief reaction 03/27/2019   Elevated serum creatinine 06/13/2018   Dysuria 06/13/2018   Hearing loss 01/02/2018   Subclinical hypothyroidism 10/26/2017   Depression with anxiety 08/24/2016   Falls frequently 08/24/2016   Generalized weakness 08/24/2016   Routine general medical examination at a health care facility 05/16/2015   Breast calcifications on mammogram 03/01/2014   Encounter for Medicare annual wellness exam 05/30/2013   Breast calcification seen on mammogram 12/14/2012   Breast calcification, left 11/28/2012   History of falling 11/27/2012   Poor balance 11/27/2012   Colon cancer screening 05/10/2012   Osteoporosis 06/21/2007   DM (diabetes mellitus), type 2, uncontrolled (HCC) 09/20/2006   Hyperlipidemia associated with  type 2 diabetes mellitus (HCC) 09/20/2006   Essential hypertension 09/20/2006   Osteoarthritis 09/20/2006     Follow-Up:  Pharmacist Review   Phil Dopp, CPP notified  Jomarie Longs, [facility] Clinical Pharmacy Assistant [phone]  We mailed Ms. Ferrante an Ozempic application on 02/12/20. Patient never received. No record of a Trulicity application at our office or with manufacturer. Will reprint Ozempic application and coordinate signatures from patient  and provider.  Phil Dopp, PharmD Clinical Pharmacist [facility] at [facility] [phone]

## 2020-04-08 ENCOUNTER — Telehealth: Payer: Self-pay

## 2020-04-08 NOTE — Telephone Encounter (Signed)
Left voicemail for Amanda Benson to coordinate Ozempic patient assistance forms.  Phil Dopp, PharmD Clinical Pharmacist [facility] at [facility] [phone]

## 2020-04-11 ENCOUNTER — Telehealth: Payer: Self-pay

## 2020-04-11 ENCOUNTER — Other Ambulatory Visit: Payer: Self-pay | Admitting: *Deleted

## 2020-04-11 MED ORDER — LOSARTAN POTASSIUM-HCTZ 100-25 MG PO TABS: 1.0000 | ORAL_TABLET | Freq: Every day | ORAL | 3 refills | Status: DC

## 2020-04-11 MED ORDER — LOSARTAN POTASSIUM-HCTZ 100-25 MG PO TABS
0.5000 | ORAL_TABLET | Freq: Every day | ORAL | 3 refills | Status: DC
Start: 2020-04-11 — End: 2021-03-23

## 2020-04-11 NOTE — Telephone Encounter (Signed)
Patient has been taking 1/2 tablet losartan/HCTZ since October 2021 due to renal function (per patient verbal and per result note from 12/11/19 "Kidney numbers are still too high- please hold metformin for now and enc water intake and cut losartan dose in 1/2 (take 1/2 pill daily) Re check cbc, bmet and a urine culture please in approx 10 days". Recent prescription sent to [facility] for 1 tablet daily. Please send a new prescription for 1/2 tablet daily or confirm you would like patient increase to 1 tablet daily.  CMP Latest Ref Rng & Units 02/05/2020 12/24/2019 12/07/2019  Glucose 70 - 99 mg/dL 341(P) 379(K) 240(X)  BUN 6 - 23 mg/dL 73(Z) 32(D) 92(E)  Creatinine 0.40 - 1.20 mg/dL 2.68(T) 4.19(Q) 2.22(L)  Sodium 135 - 145 mEq/L 135 136 137  Potassium 3.5 - 5.1 mEq/L 4.4 5.0 3.9  Chloride 96 - 112 mEq/L 99 103 102  CO2 19 - 32 mEq/L 27 25 24   Calcium 8.4 - 10.5 mg/dL 9.9 9.6  Total Protein 6.0 - 8.3 g/dL 6.5 - -  Total Bilirubin 0.2 - 1.2 mg/dL 0.3 - -  Alkaline Phos 39 - 117 U/L 70 - -  AST 0 - 37 U/L 20 - -  ALT 0 - 35 U/L 14 - -   Office blood pressures are: BP Readings from Last 3 Encounters:  02/05/20 132/60  12/07/19 130/64  11/30/19 (!) 137/50   12/02/19, PharmD Clinical Pharmacist [facility] at [facility] [phone]

## 2020-04-11 NOTE — Telephone Encounter (Signed)
I sent it  Thanks so much  

## 2020-05-07 ENCOUNTER — Telehealth: Payer: Self-pay

## 2020-05-07 ENCOUNTER — Telehealth: Payer: Self-pay | Admitting: *Deleted

## 2020-05-07 MED ORDER — SIMVASTATIN 20 MG PO TABS: ORAL_TABLET | ORAL | 0 refills | Status: DC

## 2020-05-07 MED ORDER — BUPROPION HCL ER (XL) 300 MG PO TB24: ORAL_TABLET | ORAL | 0 refills | Status: DC

## 2020-05-07 MED ORDER — RALOXIFENE HCL 60 MG PO TABS: ORAL_TABLET | ORAL | 0 refills | Status: DC

## 2020-05-07 NOTE — Telephone Encounter (Signed)
-----   Message from Phil Dopp, [facility] sent at 05/07/2020 12:30 PM EST ----- Regarding: Refill Request Ms. Dessert needs refills on bupropion 30 mg, raloxifene 60mg  and simvastatin 20mg  to [facility].  Thanks,  , PharmD Clinical Pharmacist [facility] at [facility] [phone]

## 2020-05-07 NOTE — Chronic Care Management (AMB) (Addendum)
Chronic Care Management Pharmacy Assistant   Name: Amanda Benson  MRN: [id number] DOB: 14-May-1935  Reason for Encounter: Medication Review- Medication Adherence and Delivery Coordination  Patient Question:  1.  Have you seen any other providers since your last visit? No  PCP : Tower, Audrie Gallus, MD  Allergies:   Allergies  Allergen Reactions   Alendronate Sodium     REACTION: heartburn    Medications: Outpatient Encounter Medications as of 05/07/2020  Medication Sig Note   amLODipine (NORVASC) 10 MG tablet TAKE 1 TABLET(10 MG) BY MOUTH DAILY    aspirin 81 MG EC tablet Take 81 mg by mouth daily.      buPROPion (WELLBUTRIN XL) 300 MG 24 hr tablet TAKE 1 TABLET(300 MG) BY MOUTH DAILY    cephALEXin (KEFLEX) 250 MG capsule Take 1 capsule (250 mg total) by mouth 2 (two) times daily. (Patient not taking: Reported on 02/05/2020)    fluconazole (DIFLUCAN) 100 MG tablet Take one pill by mouth every other day for 3 doses (Patient not taking: Reported on 02/05/2020)    FLUoxetine (PROZAC) 40 MG capsule TAKE 1 CAPSULE(40 MG) BY MOUTH DAILY    glipiZIDE (GLUCOTROL XL) 10 MG 24 hr tablet TAKE 1 TABLET(10 MG) BY MOUTH DAILY    insulin glargine (LANTUS SOLOSTAR) 100 UNIT/ML Solostar Pen Inject 10 Units into the skin daily. (Patient not taking: Reported on 02/05/2020)    Insulin Pen Needle 29G X MISC Use with insulin daily (Patient not taking: Reported on 02/05/2020)    losartan-hydrochlorothiazide (HYZAAR) 100-25 MG tablet Take 0.5 tablets by mouth daily.    metoprolol succinate (TOPROL-XL) 25 MG 24 hr tablet TAKE 1 TABLET(25 MG) BY MOUTH DAILY    pantoprazole (PROTONIX) 40 MG tablet Take 1 tablet (40 mg total) by mouth daily. (Patient not taking: Reported on 12/07/2019)    raloxifene (EVISTA) 60 MG tablet TAKE 1 TABLET(60 MG) BY MOUTH DAILY    Semaglutide,0.25 or 0.5MG /DOS, (OZEMPIC, 0.25 OR 0.5 MG/DOSE,) 2 MG/1.5ML SOPN Inject 0.25 mg into the skin once a week.    simvastatin (ZOCOR) 20 MG tablet  TAKE 1 TABLET(20 MG) BY MOUTH DAILY    sitaGLIPtin (JANUVIA) 50 MG tablet Take 50 mg by mouth daily. (Patient not taking: Reported on 02/05/2020) 11/23/2019: Pt. Has discontinued medication due to high cost.   No facility-administered encounter medications on file as of 05/07/2020.    Current Diagnosis: Patient Active Problem List   Diagnosis Date Noted   H/O sepsis 12/09/2019   AKI (acute kidney injury) (HCC) 11/23/2019   Leukocytosis 11/23/2019   Mobility impaired 10/13/2019   Incontinence of urine 10/12/2019   Grief reaction 03/27/2019   Elevated serum creatinine 06/13/2018   Dysuria 06/13/2018   Hearing loss 01/02/2018   Subclinical hypothyroidism 10/26/2017   Depression with anxiety 08/24/2016   Falls frequently 08/24/2016   Generalized weakness 08/24/2016   Routine general medical examination at a health care facility 05/16/2015   Breast calcifications on mammogram 03/01/2014   Encounter for Medicare annual wellness exam 05/30/2013   Breast calcification seen on mammogram 12/14/2012   Breast calcification, left 11/28/2012   History of falling 11/27/2012   Poor balance 11/27/2012   Colon cancer screening 05/10/2012   Osteoporosis 06/21/2007   DM (diabetes mellitus), type 2, uncontrolled (HCC) 09/20/2006   Hyperlipidemia associated with type 2 diabetes mellitus (HCC) 09/20/2006   Essential hypertension 09/20/2006   Osteoarthritis 09/20/2006    Reviewed chart for medication changes ahead of medication coordination call.  No  OVs, Consults, or hospital visits since last care coordination call / Pharmacist visit. No medication changes indicated  BP Readings from Last 3 Encounters:  02/05/20 132/60  12/07/19 130/64  11/30/19 (!) 137/50    Lab Results  Component Value Date   HGBA1C 8.0 (H) 02/05/2020     Patient obtains medications through Adherence Packaging  30 Days   Last adherence delivery date: 04/09/20  30 DS packs Multivitamin (Womens One A Day) - 1 tablet daily  (1 breakfast)  Amlodipine 10mg - 1 tablet daily (1 breakfast) Aspirin 81mg -1 tablet daily (1 evening meal)  Bupropion 300mg  24 hr-1 tablet daily (1 breakfast)  Calcium/Vitamin D (approx. 800 units Vit D/600mg  Calcium)- 1 tablet daily (1 evening meal)  Fluoxetine 40mg  - 1 capsule daily (1 breakfast)  Glipizide 10mg  XL - 1 tablet daily (1 breakfast)  Losartan/HCTZ 100-25mg -   tablet daily(  breakfast)  Metoprolol Succinate 25mg - 1 tablet daily (1 evening meal)  Raloxifene 60mg -1 tablet daily (1 breakfast)  Simvastatin 20mg -1 tablet daily (1 evening meal)  Patient declined the following medications last delivery: Januvia 50mg  - replaced with Ozempic  Lantus Solostar U-100 - 10 units daily -on hand  Metformin 1000,g- D/C due to kidney function Ozempic Inj 2/1.5 ML inject 0.25 mg into skin once a week - working on patient assistance  Patient is due for next adherence delivery on: 05/12/20  Called patient and reviewed medications and coordinated delivery.  This delivery to include:  05/12/20 30 DS packs Multivitamin (Womens One A Day) - 1 tablet daily (1 breakfast)  Amlodipine 10mg - 1 tablet daily (1 breakfast) Aspirin 81mg -1 tablet daily (1 evening meal)  Bupropion 300mg  24 hr-1 tablet daily (1 breakfast)  Calcium/Vitamin D (approx. 800 units Vit D/600mg  Calcium)- 1 tablet daily (1 evening meal)  Fluoxetine 40mg  - 1 capsule daily (1 breakfast)  Glipizide 10mg  XL - 1 tablet daily (1 breakfast)  Hydrochlorothiazide 12.5 mg- 1 tablet (1 breakfast) Losartan 50 mg- 1 tablet daily (breakfast) Metoprolol Succinate 25mg - 1 tablet daily (1 evening meal)  Raloxifene 60mg -1 tablet daily (1 breakfast)  Simvastatin 20mg -1 tablet daily (1 evening meal)  PRN/VIAL medications: NONE  Patient declined the following medications this month: Januvia 50mg  d/c - replaced with Ozempic  Lantus Solostar U-100 - 10 units daily - on hand  Losartan/HCTZ 100-25mg -   tablet daily(  breakfast) -  Discontinued/replaced with individual components Metformin 1000,g- D/C due to kidney function Ozempic Inj 2/1.5 ML inject 0.25 mg into skin once a week - working on patient assistance - need patient or daughter to sign.   Patient does  need refills for the following medications:  Bupropion 30 mg, Raloxifene 60mg , Simvastatin 20mg  - All requested from Dr. 05/07/20  Confirmed delivery date of 05/12/20, advised patient that pharmacy will contact her the morning of delivery.  Recent blood pressure readings are as follows: 05/02/20- 130/68 - no pulse reading  Recent blood glucose readings are ranging around 170-190. Patients daughter did not have specific readings at the moment.  Discussed with patient Ozempic patient assistance form. She agreed to stopping by their house on Friday 05/09/20 for them to sign form.   Reminded patients daughter of phone appointment 05/08/20 with 07/10/20 and to have all medications, supplements and any blood sugar and blood pressure readings available for review.   Follow-Up:  , Patient Assistance Coordination and Pharmacist Review  , CPP notified  , [facility] Clinical Pharmacy Assistant [phone]  I have reviewed  the care management and care coordination activities outlined in this encounter and I am certifying that I agree with the content of this note. No further action required.  Phil Dopp, PharmD Clinical Pharmacist [facility] at [facility] [phone]

## 2020-05-08 ENCOUNTER — Ambulatory Visit: Payer: Self-pay

## 2020-05-08 ENCOUNTER — Other Ambulatory Visit: Payer: Self-pay

## 2020-05-08 ENCOUNTER — Ambulatory Visit (INDEPENDENT_AMBULATORY_CARE_PROVIDER_SITE_OTHER): Payer: PPO

## 2020-05-08 DIAGNOSIS — I10 Essential (primary) hypertension: Secondary | ICD-10-CM | POA: Diagnosis not present

## 2020-05-08 DIAGNOSIS — E1165 Type 2 diabetes mellitus with hyperglycemia: Secondary | ICD-10-CM

## 2020-05-08 NOTE — Progress Notes (Signed)
I have personally reviewed this encounter including the documentation in this note and have collaborated with the care management provider regarding care management and care coordination activities to include development and update of the comprehensive care plan. I am certifying that I agree with the content of this note and encounter as supervising physician. ? ?Marne Tower MD  ?

## 2020-05-08 NOTE — Chronic Care Management (AMB) (Signed)
Chronic Care Management Pharmacy  Name: Amanda Benson  MRN: [id number] DOB: 05/29/1935  Chief Complaint/ HPI  Amanda Benson,  85 y.o. , female presents for their Follow-Up CCM visit with the clinical pharmacist via telephone.  PCP : Abner Greenspan, MD  Their chronic conditions include: DM, HTN, HLD, osteoporosis, subclinical hypothyroidism, depression, hearing loss, frequent falls   No office visits since last CCM 02/08/20. We have made multiple contact attempts to complete patient assistance applications without success.   Office Visits:  02/05/20: PCP/[facility] - HTN fair control, watch renal function closely; DM due for A1c today, unable to afford insulin due to coverage gap, continues glipizide, not on Januvia or Lantus, on ACE and statin; HLD, update panel today on simvastatin; subclinical hypothyroidism update labs; osteoporosis on year 4 Evista, encourged vitamin D and calcium; history of falls; depression stable on fluoxetine and wellbutrin  Medications: Outpatient Encounter Medications as of 05/08/2020  Medication Sig Note  . amLODipine (NORVASC) 10 MG tablet TAKE 1 TABLET(10 MG) BY MOUTH DAILY   . aspirin 81 MG EC tablet Take 81 mg by mouth daily.     Marland Kitchen buPROPion (WELLBUTRIN XL) 300 MG 24 hr tablet TAKE 1 TABLET(300 MG) BY MOUTH DAILY   . cephALEXin (KEFLEX) 250 MG capsule Take 1 capsule (250 mg total) by mouth 2 (two) times daily. (Patient not taking: Reported on 02/05/2020)   . fluconazole (DIFLUCAN) 100 MG tablet Take one pill by mouth every other day for 3 doses (Patient not taking: Reported on 02/05/2020)   . FLUoxetine (PROZAC) 40 MG capsule TAKE 1 CAPSULE(40 MG) BY MOUTH DAILY   . glipiZIDE (GLUCOTROL XL) 10 MG 24 hr tablet TAKE 1 TABLET(10 MG) BY MOUTH DAILY   . insulin glargine (LANTUS SOLOSTAR) 100 UNIT/ML Solostar Pen Inject 10 Units into the skin daily. (Patient not taking: Reported on 02/05/2020)   . Insulin Pen Needle 29G X 12MM MISC Use with insulin daily (Patient  not taking: Reported on 02/05/2020)   . losartan-hydrochlorothiazide (HYZAAR) 100-25 MG tablet Take 0.5 tablets by mouth daily.   . metoprolol succinate (TOPROL-XL) 25 MG 24 hr tablet TAKE 1 TABLET(25 MG) BY MOUTH DAILY   . pantoprazole (PROTONIX) 40 MG tablet Take 1 tablet (40 mg total) by mouth daily. (Patient not taking: Reported on 12/07/2019)   . raloxifene (EVISTA) 60 MG tablet TAKE 1 TABLET(60 MG) BY MOUTH DAILY   . Semaglutide,0.25 or 0.5MG /DOS, (OZEMPIC, 0.25 OR 0.5 MG/DOSE,) 2 MG/1.5ML SOPN Inject 0.25 mg into the skin once a week.   . simvastatin (ZOCOR) 20 MG tablet TAKE 1 TABLET(20 MG) BY MOUTH DAILY   . sitaGLIPtin (JANUVIA) 50 MG tablet Take 50 mg by mouth daily. (Patient not taking: Reported on 02/05/2020) 11/23/2019: Pt. Has discontinued medication due to high cost.   No facility-administered encounter medications on file as of 05/08/2020.   Current Diagnosis/Assessment:  Goals    . Patient Stated     Starting 08/29/2018, I will continue to take medications as prescribed.      . Pharmacy Care Plan     CARE PLAN ENTRY  Current Barriers:  . Chronic Disease Management support, education, and care coordination needs related to Hypertension and Diabetes   Hypertension BP Readings from Last 3 Encounters:  02/05/20 132/60  12/07/19 130/64  11/30/19 (!) 137/50 .  Pharmacist Clinical Goal(s): o Over the next 3 months, patient will work with PharmD and providers to maintain BP goal <140/90 . Current regimen:  Amlodipine 10 mg - 1 tablet daily  Losartan/HCTZ 100-25 mg - 1/2 tablet daily  Metoprolol succinate 25 mg - 1 tablet daily . Interventions: o Review home blood pressure log - 130/68, reading within goal  . Patient self care activities - Over the next 3 months, patient will: o May reduce home blood pressure monitoring to once weekly, document, and provide at future appointments  Diabetes Lab Results  Component Value Date/Time   HGBA1C 8.0 (H) 02/05/2020 04:00 PM    HGBA1C 7.3 (A) 10/12/2019 11:51 AM   HGBA1C 6.9 (A) 07/11/2019 02:35 PM   HGBA1C 7.3 (H) 03/27/2019 03:11 PM   HGBA1C 6.8 01/13/2016 12:00 AM   . Pharmacist Clinical Goal(s): o Over the next 3 months, patient will work with PharmD and providers to achieve A1c goal <7% . Current regimen:  o Glipizide 10 mg XL - 1 tablet daily . Interventions: o Assist with patient cost barriers - complete Ozempic paperwork on 05/12/20 during PCP visit  . Patient self care activities - Over the next 3 months, patient will: o Check blood sugar before breakfast, document, and provide at future appointments o Contact provider with any episodes of hypoglycemia  Medication management . Pharmacist Clinical Goal(s): o Over the next 3 months, patient will work with PharmD and providers to achieve optimal medication adherence . Current pharmacy: [facility] . Interventions o Comprehensive medication review performed. o Utilize [facility] for medication synchronization, packaging and delivery . Patient self care activities - Over the next 3 months, patient will: o Report any questions or concerns to PharmD, Debbora Dus  Please see past updates related to this goal by clicking on the "Past Updates" button in the selected goal        Diabetes   CrCl cannot be calculated (Patient's most recent lab result is older than the maximum 21 days allowed.). Lab Results  Component Value Date   CREATININE 1.61 (H) 02/05/2020   BUN 33 (H) 02/05/2020   GFR 29.61 (L) 02/05/2020   GFRNONAA 23 (L) 11/30/2019   GFRAA 27 (L) 11/30/2019   NA 135 02/05/2020   K 4.4 02/05/2020   CALCIUM 9.9 02/05/2020   CO2 27 02/05/2020   Recent Relevant Labs: Lab Results  Component Value Date/Time   HGBA1C 8.0 (H) 02/05/2020 04:00 PM   HGBA1C 7.3 (A) 10/12/2019 11:51 AM   HGBA1C 6.9 (A) 07/11/2019 02:35 PM   HGBA1C 7.3 (H) 03/27/2019 03:11 PM   HGBA1C 6.8 01/13/2016 12:00 AM   MICROALBUR 2.7 (H) 09/19/2009 08:54 AM    MICROALBUR 0.6 01/08/2008 11:01 AM    A1c goal < 7%  Patient is currently uncontrolled on the following medications:   Glipizide 10 mg XL - 1 tablet daily  Not taking due to cost:  Ozempic 0.25 mg - Inject once weekly - need signatures from patient and provider   Previous therapies:  Metformin 1000 mg - 1 BID (d/c 10/11 due to CKD)  Update 05/08/20: At last visit we discussed option to complete patient assistance for Januvia and insulin or consider trial of Ozempic instead if PCP approves. Ozempic was approved by PCP, however, no forms have been completed yet. CCM team was unable to reach patient despite multiple attempts to complete. Today, [name] reports they will be at PCP office on 05/12/20. We will have the application at the front desk for them to complete Monday, 05/12/20. [name] reports she usually checks BG every morning. Ranging 165-200 in the morning. She has not checked BG before  supper in past month. No hypoglycemia.   Plan: Continue glipizide as prescribed. Ozempic paperwork at front desk ready for her sign on Monday.    Hypertension   CMP Latest Ref Rng & Units 02/05/2020 12/24/2019 12/07/2019  Glucose 70 - 99 mg/dL 235(H) 259(H) 166(H)  BUN 6 - 23 mg/dL 33(H) 39(H) 27(H)  Creatinine 0.40 - 1.20 mg/dL 1.61(H) 1.84(H) 2.01(H)  Sodium 135 - 145 mEq/L 135 136 137  Potassium 3.5 - 5.1 mEq/L 4.4 5.0 3.9  Chloride 96 - 112 mEq/L 99 103 102  CO2 19 - 32 mEq/L $Remove'27 25 24  'XRrUqBd$ Calcium 8.4 - 10.5 mg/dL 9.9 9.6 10.3  Total Protein 6.0 - 8.3 g/dL 6.5 - -  Total Bilirubin 0.2 - 1.2 mg/dL 0.3 - -  Alkaline Phos 39 - 117 U/L 70 - -  AST 0 - 37 U/L 20 - -  ALT 0 - 35 U/L 14 - -   Office blood pressures are: BP Readings from Last 3 Encounters:  02/05/20 132/60  12/07/19 130/64  11/30/19 (!) 137/50   BP goal < 140/90 mmHg Patient checks BP at home daily BP monitoring: seated, legs uncrossed, arm cuff before breakfast   Patient has failed these meds in the past: none Patient is  currently controlled on the following medications:   Amlodipine 10 mg - 1 tablet daily  Losartan/HCTZ 100-25 mg - 1/2 tablet daily (dose reduced to 1/2 tablet 10/11 due to CKD)  Metoprolol succinate 25 mg - 1 tablet daily  Update 05/08/20: Reviewed home BP log: 05/02/20 - 130/68. Patient is now using adherence packaging and reports this is working well.   Plan: Continue current medications; May reduce home BP monitoring to weekly.  Hyperlipidemia   LDL goal < 100  Last lipids Lab Results  Component Value Date   CHOL 132 02/05/2020   HDL 40.80 02/05/2020   LDLCALC 54 02/05/2020   LDLDIRECT 59.0 06/09/2018   TRIG 182.0 (H) 02/05/2020   CHOLHDL 3 02/05/2020   Hepatic Function Latest Ref Rng & Units 02/05/2020 11/22/2019 03/27/2019  Total Protein 6.0 - 8.3 g/dL 6.5 7.0 6.8  Albumin 3.5 - 5.2 g/dL 3.2(L) 2.6(L) 3.8  AST 0 - 37 U/L $Remo'20 19 17  'hrHFe$ ALT 0 - 35 U/L $Remo'14 11 17  'iBLTc$ Alk Phosphatase 39 - 117 U/L 70 63 59  Total Bilirubin 0.2 - 1.2 mg/dL 0.3 0.5 0.4  Bilirubin, Direct 0.0 - 0.3 mg/dL - - -    CBC Latest Ref Rng & Units 02/05/2020 12/24/2019 12/07/2019  WBC 4.0 - 10.5 K/uL 11.2(H) 10.8(H) 15.1(H)  Hemoglobin 12.0 - 15.0 g/dL 10.4(L) 10.1(L) 9.9(L)  Hematocrit 36.0 - 46.0 % 32.5(L) 32.0(L) 31.5(L)  Platelets 150.0 - 400.0 K/uL 522.0(H) 540.0(H) 743.0(H)   The ASCVD Risk score Mikey Bussing DC Jr., et al., 2013) failed to calculate for the following reasons:   The 2013 ASCVD risk score is only valid for ages 107 to 78   Patient has failed these meds in past: none Patient is currently controlled on the following medications:  . Simvastatin 20 mg - 1 tablet daily . Aspirin 81 mg - 1 tablet  Update 05/08/20: Recommend pt stop aspirin for primary prevention due to age > 61 and bleeding risk.  Plan: Continue current medications. Recommend stopping aspirin for primary prevention due to age/bleeding risk.  Depression   Patient has failed these meds in past: none Patient is currently controlled on  the following medications:   Fluoxetine 40 mg - 1 capsule daily  Bupropion 300 mg 24 hr - 1 tablet daily  We discussed: denies changes in mood, overall doing okay   No updates/changes 05/08/20  Plan: Continue current medications  Osteopenia / Osteoporosis   Last DEXA Scan: 05/2012, declines repeat scans   VITD  Date Value Ref Range Status  02/05/2020 62.13 30.00 - 100.00 ng/mL Final    Patient has failed these meds in past: none Patient is currently controlled on the following medications:  . Raloxifene 60 mg - 1 tablet daily . Calcium 600 mg/vitamin D 800 IU - 1 daily  Update 05/08/20: Pt now taking daily calcium and vitamin D supplement.  Plan: Continue current medications  Medication Management   Patient's preferred pharmacy is:  [facility] - [place], [place] - [address] [place] [address] Phone: [phone] Fax: [phone]  Uses pill box? Adherence packaging  Plan  Continue current medication management strategy  Follow up:  6 week phone visit  Debbora Dus, PharmD Clinical Pharmacist [facility] at [facility] [phone]

## 2020-05-08 NOTE — Chronic Care Management (AMB) (Signed)
Multiple attempts to reach Amanda Benson and Amanda Benson for CCM visit today to discuss diabetes management. Left voicemail requesting an urgent call back at [phone].  Phil Dopp, PharmD Clinical Pharmacist [facility] at [facility] [phone]

## 2020-05-08 NOTE — Patient Instructions (Signed)
Dear Amanda Benson,  Below is a summary of the goals we discussed during our follow up appointment on May 08, 2020. Please contact me anytime with questions or concerns.   Visit Information  Goals Addressed            This Visit's Progress   . Pharmacy Care Plan       CARE PLAN ENTRY  Current Barriers:  . Chronic Disease Management support, education, and care coordination needs related to Hypertension and Diabetes   Hypertension BP Readings from Last 3 Encounters:  02/05/20 132/60  12/07/19 130/64  11/30/19 (!) 137/50 .  Pharmacist Clinical Goal(s): o Over the next 3 months, patient will work with PharmD and providers to maintain BP goal <140/90 . Current regimen:   Amlodipine 10 mg - 1 tablet daily  Losartan/HCTZ 100-25 mg - 1/2 tablet daily  Metoprolol succinate 25 mg - 1 tablet daily . Interventions: o Review home blood pressure log - 130/68, reading within goal  . Patient self care activities - Over the next 3 months, patient will: o May reduce home blood pressure monitoring to once weekly, document, and provide at future appointments  Diabetes Lab Results  Component Value Date/Time   HGBA1C 8.0 (H) 02/05/2020 04:00 PM   HGBA1C 7.3 (A) 10/12/2019 11:51 AM   HGBA1C 6.9 (A) 07/11/2019 02:35 PM   HGBA1C 7.3 (H) 03/27/2019 03:11 PM   HGBA1C 6.8 01/13/2016 12:00 AM   . Pharmacist Clinical Goal(s): o Over the next 3 months, patient will work with PharmD and providers to achieve A1c goal <7% . Current regimen:  o Glipizide 10 mg XL - 1 tablet daily . Interventions: o Assist with patient cost barriers - complete Ozempic paperwork on 05/12/20 during PCP visit  . Patient self care activities - Over the next 3 months, patient will: o Check blood sugar before breakfast, document, and provide at future appointments o Contact provider with any episodes of hypoglycemia  Medication management . Pharmacist Clinical Goal(s): o Over the next 3 months, patient will work  with PharmD and providers to achieve optimal medication adherence . Current pharmacy: [facility] . Interventions o Comprehensive medication review performed. o Utilize [facility] pharmacy for medication synchronization, packaging and delivery . Patient self care activities - Over the next 3 months, patient will: o Report any questions or concerns to PharmD, Phil Dopp  Please see past updates related to this goal by clicking on the "Past Updates" button in the selected goal        The patient verbalized understanding of instructions, educational materials, and care plan provided today and declined offer to receive copy of patient instructions, educational materials, and care plan.   The pharmacy team will reach out to the patient again over the next 30 days.   Phil Dopp, PharmD Clinical Pharmacist [facility] at [facility] [phone]   Diabetes Mellitus and Standards of Medical Care Living with and managing diabetes (diabetes mellitus) can be complicated. Your diabetes treatment may be managed by a team of health care providers, including:  A physician who specializes in diabetes (endocrinologist). You might also have visits with a nurse practitioner or physician assistant.  Nurses.  A registered dietitian.  A certified diabetes care and education specialist.  An exercise specialist.  A pharmacist.  An eye doctor.  A foot specialist (podiatrist).  A dental care provider.  A primary care provider.  A mental health care provider. How to manage your diabetes You can do many things  to successfully manage your diabetes. Your health care providers will follow guidelines to help you get the best quality of care. Here are general guidelines for your diabetes management plan. Your health care providers may give you more specific instructions. Physical exams When you are diagnosed with diabetes, and each year after that, your health care provider  will ask about your medical and family history. You will have a physical exam, which may include:  Measuring your height, weight, and body mass index (BMI).  Checking your blood pressure. This will be done at every routine medical visit. Your target blood pressure may vary depending on your medical conditions, your age, and other factors.  A thyroid exam.  A skin exam.  Screening for nerve damage (peripheral neuropathy). This may include checking the pulse in your legs and feet and the level of sensation in your hands and feet.  A foot exam to inspect the structure and skin of your feet, including checking for cuts, bruises, redness, blisters, sores, or other problems.  Screening for blood vessel (vascular) problems. This may include checking the pulse in your legs and feet and checking your temperature. Blood tests Depending on your treatment plan and your personal needs, you may have the following tests:  Hemoglobin A1C (HbA1C). This test provides information about blood sugar (glucose) control over the previous 2-3 months. It is used to adjust your treatment plan, if needed. This test will be done: ? At least 2 times a year, if you are meeting your treatment goals. ? 4 times a year, if you are not meeting your treatment goals or if your goals have changed.  Lipid testing, including total cholesterol, LDL and HDL cholesterol, and triglyceride levels. ? The goal for LDL is less than 100 mg/dL (5.5 mmol/L). If you are at high risk for complications, the goal is less than 70 mg/dL (3.9 mmol/L). ? The goal for HDL is 40 mg/dL (2.2 mmol/L) or higher for men, and 50 mg/dL (2.8 mmol/L) or higher for women. An HDL cholesterol of 60 mg/dL (3.3 mmol/L) or higher gives some protection against heart disease. ? The goal for triglycerides is less than 150 mg/dL (8.3 mmol/L).  Liver function tests.  Kidney function tests.  Thyroid function tests.   Dental and eye exams  Visit your dentist two  times a year.  If you have type 1 diabetes, your health care provider may recommend an eye exam within 5 years after you are diagnosed, and then once a year after your first exam. ? For children with type 1 diabetes, the health care provider may recommend an eye exam when your child is age 45 or older and has had diabetes for 3-5 years. After the first exam, your child should get an eye exam once a year.  If you have type 2 diabetes, your health care provider may recommend an eye exam as soon as you are diagnosed, and then every 1-2 years after your first exam.   Immunizations  A yearly flu (influenza) vaccine is recommended annually for everyone 6 months or older. This is especially important if you have diabetes.  The pneumonia (pneumococcal) vaccine is recommended for everyone 2 years or older who has diabetes. If you are age 85 or older, you may get the pneumonia vaccine as a series of two separate shots.  The hepatitis B vaccine is recommended for adults shortly after being diagnosed with diabetes. Adults and children with diabetes should receive all other vaccines according to age-specific recommendations from  the Centers for Disease Control and Prevention (CDC). Mental and emotional health Screening for symptoms of eating disorders, anxiety, and depression is recommended at the time of diagnosis and after as needed. If your screening shows that you have symptoms, you may need more evaluation. You may work with a mental health care provider. Follow these instructions at home: Treatment plan You will monitor your blood glucose levels and may give yourself insulin. Your treatment plan will be reviewed at every medical visit. You and your health care provider will discuss:  How you are taking your medicines, including insulin.  Any side effects you have.  Your blood glucose level target goals.  How often you monitor your blood glucose level.  Lifestyle habits, such as activity level  and tobacco, alcohol, and substance use. Education Your health care provider will assess how well you are monitoring your blood glucose levels and whether you are taking your insulin and medicines correctly. He or she may refer you to:  A certified diabetes care and education specialist to manage your diabetes throughout your life, starting at diagnosis.  A registered dietitian who can create and review your personal nutrition plan.  An exercise specialist who can discuss your activity level and exercise plan. General instructions  Take over-the-counter and prescription medicines only as told by your health care provider.  Keep all follow-up visits. This is important. Where to find support There are many diabetes support networks, including:  American Diabetes Association (ADA): diabetes.org  Defeat Diabetes Foundation: defeatdiabetes.org Where to find more information  American Diabetes Association (ADA): www.diabetes.org  Association of Diabetes Care & Education Specialists (ADCES): diabeteseducator.org  International Diabetes Federation (IDF): http://hill.biz/ Summary  Managing diabetes (diabetes mellitus) can be complicated. Your diabetes treatment may be managed by a team of health care providers.  Your health care providers follow guidelines to help you get the best quality care.  You should have physical exams, blood tests, blood pressure monitoring, immunizations, and screening tests regularly. Stay updated on how to manage your diabetes.  Your health care providers may also give you more specific instructions based on your individual health. This information is not intended to replace advice given to you by your health care provider. Make sure you discuss any questions you have with your health care provider. Document Revised: 09/27/2019 Document Reviewed: 09/27/2019 Elsevier Patient Education  2021 [employer].

## 2020-05-12 ENCOUNTER — Encounter: Payer: Self-pay | Admitting: Family Medicine

## 2020-05-12 ENCOUNTER — Other Ambulatory Visit: Payer: Self-pay

## 2020-05-12 ENCOUNTER — Ambulatory Visit (INDEPENDENT_AMBULATORY_CARE_PROVIDER_SITE_OTHER): Payer: PPO | Admitting: Family Medicine

## 2020-05-12 VITALS — BP 140/82 | HR 79 | Temp 96.9°F | Ht 62.5 in | Wt 142.2 lb

## 2020-05-12 DIAGNOSIS — R7989 Other specified abnormal findings of blood chemistry: Secondary | ICD-10-CM

## 2020-05-12 DIAGNOSIS — R300 Dysuria: Secondary | ICD-10-CM

## 2020-05-12 DIAGNOSIS — E1165 Type 2 diabetes mellitus with hyperglycemia: Secondary | ICD-10-CM | POA: Diagnosis not present

## 2020-05-12 DIAGNOSIS — I10 Essential (primary) hypertension: Secondary | ICD-10-CM

## 2020-05-12 NOTE — Patient Instructions (Addendum)
Bring a urine sample back when you can   Cutting way back on sweets -to once a week would be great  Walking more in the house is also ok   Try to get most of your carbohydrates from produce (with the exception of white potatoes)  Eat less bread/pasta/rice/snack foods/cereals/sweets and other items from the middle of the grocery store (processed carbs)   Keep working on fluids   Labs today  Pending results we will plan follow up   I will touch base with Phil Dopp to get the status of drug approvals

## 2020-05-12 NOTE — Assessment & Plan Note (Signed)
Pt took collection supplies to bring in a urine sample  Mild  No hematuria

## 2020-05-12 NOTE — Assessment & Plan Note (Addendum)
bp in fair control at this time  BP Readings from Last 1 Encounters:  05/12/20 140/82   No changes needed Most recent labs reviewed  Disc lifstyle change with low sodium diet and exercise  Now on reduced dose of losartan -hct 50-12.5 in light of renal insuff Plan to continue metoprolol xl 25 mg daily and amlodipine 10 mg daily Lab today

## 2020-05-12 NOTE — Assessment & Plan Note (Signed)
Last 1.61 Pt was inst to hold metformin and cut losartan hct in 1/2 and drink more fluids Lab today

## 2020-05-12 NOTE — Progress Notes (Signed)
Subjective:    Patient ID: Amanda Benson, female    DOB: [birth year], 85 y.o.   MRN: [id number]  This visit occurred during the SARS-CoV-2 public health emergency.  Safety protocols were in place, including screening questions prior to the visit, additional usage of staff PPE, and extensive cleaning of exam room while observing appropriate contact time as indicated for disinfecting solutions.    HPI Pt presents for f/u of chronic medical problems   Also ? uti   Wt Readings from Last 3 Encounters:  05/12/20 142 lb 3 oz (64.5 kg)  02/05/20 135 lb 8 oz (61.5 kg)  11/25/19 138 lb 0.1 oz (62.6 kg)   25.59 kg/m   Feels ok overall  Worried about a uti  (little pain -sometimes pain to urinate)  No bladder pain  Is incontinent   Not doing a lot lately   HTN bp is stable today  No cp or palpitations or headaches or edema  No side effects to medicines  BP Readings from Last 3 Encounters:  05/12/20 (!) 150/72  02/05/20 132/60  12/07/19 130/64     Losartan hct cut for renal insuff Stopped metformin   Lab Results  Component Value Date   CREATININE 1.61 (H) 02/05/2020   BUN 33 (H) 02/05/2020   NA 135 02/05/2020   K 4.4 02/05/2020   CL 99 02/05/2020   CO2 27 02/05/2020  daughter has her drinking 0 sugar drinks  Also some water     DM2 Lab Results  Component Value Date   HGBA1C 8.0 (H) 02/05/2020   Due for re check  Glipizide xl 10 mg daily   ozempic 0.25 - Marcelino Duster was working on coverage - pt just signed them  Looking at [employer] -could not afford  lantus - could not afford   Should not still be in the donut hole   Am - 140s to low 200s Higher after eating - high 100s to low 200s  Diet is fair so now surprised  About the best she can do  Walking for exercise- goes to bathroom and bedroom and back   Eats sweets once per day    pref trulicity if possible due to convenience (weekly)   Patient Active Problem List   Diagnosis Date Noted   . H/O sepsis 12/09/2019  . AKI (acute kidney injury) (HCC) 11/23/2019  . Leukocytosis 11/23/2019  . Mobility impaired 10/13/2019  . Incontinence of urine 10/12/2019  . Grief reaction 03/27/2019  . Elevated serum creatinine 06/13/2018  . Dysuria 06/13/2018  . Hearing loss 01/02/2018  . Subclinical hypothyroidism 10/26/2017  . Depression with anxiety 08/24/2016  . Falls frequently 08/24/2016  . Generalized weakness 08/24/2016  . Routine general medical examination at a health care facility 05/16/2015  . Breast calcifications on mammogram 03/01/2014  . Encounter for Medicare annual wellness exam 05/30/2013  . Breast calcification seen on mammogram 12/14/2012  . Breast calcification, left 11/28/2012  . History of falling 11/27/2012  . Poor balance 11/27/2012  . Colon cancer screening 05/10/2012  . Osteoporosis 06/21/2007  . DM (diabetes mellitus), type 2, uncontrolled (HCC) 09/20/2006  . Hyperlipidemia associated with type 2 diabetes mellitus (HCC) 09/20/2006  . Essential hypertension 09/20/2006  . Osteoarthritis 09/20/2006   Past Medical History:  Diagnosis Date  . Depression   . Diabetes mellitus    type II  . Hyperlipidemia   . Hypertension   . Obesity   . Osteoporosis    Past Surgical  History:  Procedure Laterality Date  . ANKLE SURGERY     left medial malleblus fracture  . CHOLECYSTECTOMY     Social History   Tobacco Use  . Smoking status: Never Smoker  . Smokeless tobacco: Never Used  Vaping Use  . Vaping Use: Never used  Substance Use Topics  . Alcohol use: No    Alcohol/week: 0.0 standard drinks  . Drug use: No   Family History  Problem Relation Age of Onset  . Diabetes Mother   . Hypertension Mother   . Asthma Father   . Hypertension Sister   . Cancer Sister        lung  . Asthma Brother   . COPD Brother   . Cancer Maternal Grandmother        breast   . Asthma Brother   . Diabetes Mellitus II Daughter   . COPD Daughter    Allergies   Allergen Reactions  . Alendronate Sodium     REACTION: heartburn   Current Outpatient Medications on File Prior to Visit  Medication Sig Dispense Refill  . amLODipine (NORVASC) 10 MG tablet TAKE 1 TABLET(10 MG) BY MOUTH DAILY 90 tablet 0  . aspirin 81 MG EC tablet Take 81 mg by mouth daily.    Marland Kitchen buPROPion (WELLBUTRIN XL) 300 MG 24 hr tablet TAKE 1 TABLET(300 MG) BY MOUTH DAILY 90 tablet 0  . FLUoxetine (PROZAC) 40 MG capsule TAKE 1 CAPSULE(40 MG) BY MOUTH DAILY 90 capsule 0  . glipiZIDE (GLUCOTROL XL) 10 MG 24 hr tablet TAKE 1 TABLET(10 MG) BY MOUTH DAILY 90 tablet 0  . Insulin Pen Needle 29G X MISC Use with insulin daily 90 each 0  . losartan-hydrochlorothiazide (HYZAAR) 100-25 MG tablet Take 0.5 tablets by mouth daily. 45 tablet 3  . metoprolol succinate (TOPROL-XL) 25 MG 24 hr tablet TAKE 1 TABLET(25 MG) BY MOUTH DAILY 90 tablet 0  . raloxifene (EVISTA) 60 MG tablet TAKE 1 TABLET(60 MG) BY MOUTH DAILY 90 tablet 0  . Semaglutide,0.25 or 0.5MG /DOS, (OZEMPIC, 0.25 OR 0.5 MG/DOSE,) 2 MG/1.5ML SOPN Inject 0.25 mg into the skin once a week. 1.5 mL 3  . simvastatin (ZOCOR) 20 MG tablet TAKE 1 TABLET(20 MG) BY MOUTH DAILY 90 tablet 0  . pantoprazole (PROTONIX) 40 MG tablet Take 1 tablet (40 mg total) by mouth daily. (Patient not taking: Reported on 12/07/2019) 30 tablet 0   No current facility-administered medications on file prior to visit.    Review of Systems  Constitutional: Positive for fatigue. Negative for activity change, appetite change, fever and unexpected weight change.  HENT: Negative for congestion, ear pain, rhinorrhea, sinus pressure and sore throat.   Eyes: Negative for pain, redness and visual disturbance.  Respiratory: Negative for cough, shortness of breath and wheezing.   Cardiovascular: Negative for chest pain and palpitations.  Gastrointestinal: Negative for abdominal pain, blood in stool, constipation and diarrhea.  Endocrine: Negative for polydipsia, polyphagia  and polyuria.  Genitourinary: Positive for dysuria and frequency. Negative for urgency.       Urinary incontinence  Musculoskeletal: Positive for arthralgias. Negative for back pain and myalgias.  Skin: Negative for pallor and rash.  Allergic/Immunologic: Negative for environmental allergies.  Neurological: Negative for dizziness, syncope and headaches.  Hematological: Negative for adenopathy. Does not bruise/bleed easily.  Psychiatric/Behavioral: Negative for decreased concentration and dysphoric mood. The patient is not nervous/anxious.        Objective:   Physical Exam Constitutional:      General:  She is not in acute distress.    Appearance: Normal appearance. She is well-developed, normal weight and well-nourished. She is not ill-appearing.  HENT:     Head: Normocephalic and atraumatic.     Mouth/Throat:     Mouth: Oropharynx is clear and moist.  Eyes:     Extraocular Movements: EOM normal.     Conjunctiva/sclera: Conjunctivae normal.     Pupils: Pupils are equal, round, and reactive to light.  Neck:     Thyroid: No thyromegaly.     Vascular: No carotid bruit or JVD.  Cardiovascular:     Rate and Rhythm: Normal rate and regular rhythm.     Pulses: Intact distal pulses.     Heart sounds: Normal heart sounds. No gallop.   Pulmonary:     Effort: Pulmonary effort is normal. No respiratory distress.     Breath sounds: Normal breath sounds. No wheezing or rales.     Comments: No crackles Abdominal:     General: Bowel sounds are normal. There is no distension or abdominal bruit.     Palpations: Abdomen is soft. There is no mass.     Tenderness: There is no abdominal tenderness.  Musculoskeletal:        General: No edema.     Cervical back: Normal range of motion and neck supple.     Right lower leg: No edema.     Left lower leg: No edema.  Lymphadenopathy:     Cervical: No cervical adenopathy.  Skin:    General: Skin is warm and dry.     Coloration: Skin is not pale.      Findings: No erythema or rash.  Neurological:     Mental Status: She is alert.     Cranial Nerves: No cranial nerve deficit.     Coordination: Coordination normal.     Deep Tendon Reflexes: Reflexes are normal and symmetric. Reflexes normal.  Psychiatric:        Mood and Affect: Mood and affect and mood normal.           Assessment & Plan:   Problem List Items Addressed This Visit      Cardiovascular and Mediastinum   Essential hypertension    bp in fair control at this time  BP Readings from Last 1 Encounters:  05/12/20 140/82   No changes needed Most recent labs reviewed  Disc lifstyle change with low sodium diet and exercise  Now on reduced dose of losartan -hct 50-12.5 in light of renal insuff Plan to continue metoprolol xl 25 mg daily and amlodipine 10 mg daily Lab today      Relevant Orders   Basic metabolic panel     Endocrine   DM (diabetes mellitus), type 2, uncontrolled (HCC) - Primary    A1C today Glucose readings are not optimal  Metformin stopped and losartan hct cut back due to renal insuff Pt is waiting for update re: pt financial aide for med  Interested in trulicity or ozempic  Pending labs will check in with our pharmacist Phil Dopp        Relevant Orders   Hemoglobin A1c     Other   Elevated serum creatinine    Last 1.61 Pt was inst to hold metformin and cut losartan hct in 1/2 and drink more fluids Lab today      Dysuria    Pt took collection supplies to bring in a urine sample  Mild  No hematuria

## 2020-05-12 NOTE — Assessment & Plan Note (Signed)
A1C today Glucose readings are not optimal  Metformin stopped and losartan hct cut back due to renal insuff Pt is waiting for update re: pt financial aide for med  Interested in trulicity or ozempic  Pending labs will check in with our pharmacist Phil Dopp

## 2020-05-13 ENCOUNTER — Ambulatory Visit: Payer: Self-pay

## 2020-05-13 LAB — HEMOGLOBIN A1C: Hgb A1c MFr Bld: 9 % — ABNORMAL HIGH (ref 4.6–6.5)

## 2020-05-13 LAB — BASIC METABOLIC PANEL
BUN: 38 mg/dL — ABNORMAL HIGH (ref 6–23)
CO2: 30 mEq/L (ref 19–32)
Calcium: 10.3 mg/dL (ref 8.4–10.5)
Chloride: 99 mEq/L (ref 96–112)
Creatinine, Ser: 1.76 mg/dL — ABNORMAL HIGH (ref 0.40–1.20)
GFR: 26.56 mL/min — ABNORMAL LOW (ref >60.00)
Glucose, Bld: 245 mg/dL — ABNORMAL HIGH (ref 70–99)
Potassium: 4.7 mEq/L (ref 3.5–5.1)
Sodium: 136 mEq/L (ref 135–145)

## 2020-05-13 NOTE — Chronic Care Management (AMB) (Signed)
Ozempic application received and completed by patient. Faxed patient portion to [employer] 05/13/20. Placed provider portion in Dr. Royden Purl box to sign and fax.  Phil Dopp, PharmD Clinical Pharmacist [facility] at [facility] [phone]

## 2020-05-13 NOTE — Progress Notes (Signed)
Patient completed Ozempic application and her portion was faxed today 2/8. Placed in your box for signature.

## 2020-05-14 ENCOUNTER — Telehealth: Payer: Self-pay | Admitting: Family Medicine

## 2020-05-14 DIAGNOSIS — N183 Chronic kidney disease, stage 3 unspecified: Secondary | ICD-10-CM

## 2020-05-14 NOTE — Assessment & Plan Note (Signed)
GFR of 26

## 2020-05-14 NOTE — Telephone Encounter (Signed)
Referral done

## 2020-05-14 NOTE — Telephone Encounter (Signed)
-----   Message from Shon Millet, [place] sent at 05/14/2020  4:46 PM EST ----- Pt's daughter [name] notified of Dr. Royden Purl comments regarding labs. Pt/daughter also filled out forms for DM meds assistance while she was here for appt so will wait to hear what Marcelino Duster says regarding that. Daughter does agree with nephrologist referral, she would like her seen in [place], I advise daughter PCP will put referral in and our PCP will call to schedule appt

## 2020-05-26 ENCOUNTER — Telehealth: Payer: Self-pay

## 2020-05-26 NOTE — Chronic Care Management (AMB) (Addendum)
    Chronic Care Management Pharmacy Assistant   Name: Amanda Benson  MRN: [id number] DOB: [birth year]  Reason for Encounter: Patient Assistance Application (Ozempic)   PCP : Judy Pimple, MD  Allergies:   Allergies  Allergen Reactions   Alendronate Sodium     REACTION: heartburn    Medications: Outpatient Encounter Medications as of 05/26/2020  Medication Sig   amLODipine (NORVASC) 10 MG tablet TAKE 1 TABLET(10 MG) BY MOUTH DAILY   aspirin 81 MG EC tablet Take 81 mg by mouth daily.   buPROPion (WELLBUTRIN XL) 300 MG 24 hr tablet TAKE 1 TABLET(300 MG) BY MOUTH DAILY   FLUoxetine (PROZAC) 40 MG capsule TAKE 1 CAPSULE(40 MG) BY MOUTH DAILY   glipiZIDE (GLUCOTROL XL) 10 MG 24 hr tablet TAKE 1 TABLET(10 MG) BY MOUTH DAILY   Insulin Pen Needle 29G X MISC Use with insulin daily   losartan-hydrochlorothiazide (HYZAAR) 100-25 MG tablet Take 0.5 tablets by mouth daily.   metoprolol succinate (TOPROL-XL) 25 MG 24 hr tablet TAKE 1 TABLET(25 MG) BY MOUTH DAILY   pantoprazole (PROTONIX) 40 MG tablet Take 1 tablet (40 mg total) by mouth daily. (Patient not taking: Reported on 12/07/2019)   raloxifene (EVISTA) 60 MG tablet TAKE 1 TABLET(60 MG) BY MOUTH DAILY   Semaglutide,0.25 or 0.5MG /DOS, (OZEMPIC, 0.25 OR 0.5 MG/DOSE,) 2 MG/1.5ML SOPN Inject 0.25 mg into the skin once a week.   simvastatin (ZOCOR) 20 MG tablet TAKE 1 TABLET(20 MG) BY MOUTH DAILY   No facility-administered encounter medications on file as of 05/26/2020.    Current Diagnosis: Patient Active Problem List   Diagnosis Date Noted   H/O sepsis 12/09/2019   CKD (chronic kidney disease) 11/23/2019   Leukocytosis 11/23/2019   Mobility impaired 10/13/2019   Incontinence of urine 10/12/2019   Grief reaction 03/27/2019   Elevated serum creatinine 06/13/2018   Dysuria 06/13/2018   Hearing loss 01/02/2018   Subclinical hypothyroidism 10/26/2017   Depression with anxiety 08/24/2016   Falls frequently 08/24/2016    Generalized weakness 08/24/2016   Routine general medical examination at a health care facility 05/16/2015   Breast calcifications on mammogram 03/01/2014   Encounter for Medicare annual wellness exam 05/30/2013   Breast calcification seen on mammogram 12/14/2012   Breast calcification, left 11/28/2012   History of falling 11/27/2012   Poor balance 11/27/2012   Colon cancer screening 05/10/2012   Osteoporosis 06/21/2007   DM (diabetes mellitus), type 2, uncontrolled (HCC) 09/20/2006   Hyperlipidemia associated with type 2 diabetes mellitus (HCC) 09/20/2006   Essential hypertension 09/20/2006   Osteoarthritis 09/20/2006   Contacted [employer] for update on Ms. Debrosse Ozempic application. Per [employer] application has been approved as of 05/26/20. Patient should receive delivery 10-15 business days from 05/26/20. Patient made aware.   Follow-Up:  Patient Assistance Coordination and Pharmacist Review  Phil Dopp, CPP notified  Jomarie Longs, [facility] Clinical Pharmacy Assistant [phone]  Scheduled follow up for 06/06/20 to review administration  Phil Dopp, PharmD Clinical Pharmacist [facility] at [facility] [phone]

## 2020-05-28 ENCOUNTER — Other Ambulatory Visit (INDEPENDENT_AMBULATORY_CARE_PROVIDER_SITE_OTHER): Payer: PPO

## 2020-05-28 ENCOUNTER — Other Ambulatory Visit: Payer: Self-pay

## 2020-05-28 DIAGNOSIS — R32 Unspecified urinary incontinence: Secondary | ICD-10-CM

## 2020-05-28 DIAGNOSIS — R7989 Other specified abnormal findings of blood chemistry: Secondary | ICD-10-CM

## 2020-05-28 DIAGNOSIS — R8290 Unspecified abnormal findings in urine: Secondary | ICD-10-CM | POA: Diagnosis not present

## 2020-05-28 LAB — POC URINALSYSI DIPSTICK (AUTOMATED)
Bilirubin, UA: NEGATIVE
Blood, UA: 200
Glucose, UA: NEGATIVE
Ketones, UA: NEGATIVE
Nitrite, UA: NEGATIVE
Protein, UA: NEGATIVE
Spec Grav, UA: 1.02 (ref 1.010–1.025)
Urobilinogen, UA: 0.2 E.U./dL
pH, UA: 6.5 (ref 5.0–8.0)

## 2020-05-30 ENCOUNTER — Other Ambulatory Visit: Payer: Self-pay | Admitting: Family Medicine

## 2020-05-30 ENCOUNTER — Telehealth: Payer: Self-pay

## 2020-05-30 LAB — URINE CULTURE
MICRO NUMBER:: 11569747
SPECIMEN QUALITY:: ADEQUATE

## 2020-05-30 MED ORDER — CEPHALEXIN 500 MG PO CAPS: 500.0000 mg | ORAL_CAPSULE | Freq: Two times a day (BID) | ORAL | 0 refills | Status: DC

## 2020-05-30 NOTE — Telephone Encounter (Signed)
Urine is positive for e coli uti  Encourage water intake  Pended keflex to send  Alert [place] if no improvement in symptoms after this

## 2020-05-30 NOTE — Telephone Encounter (Signed)
Daughter notified of urine cx results and Dr. Royden Purl comments Rx sent to pharmacy

## 2020-05-30 NOTE — Chronic Care Management (AMB) (Addendum)
Chronic Care Management Pharmacy Assistant   Name: Amanda Benson  MRN: [id number] DOB: 11/26/1935  Reason for Encounter: Medication Review- Medication Adherence and Delivery Coordination  Patient Question:  1.  Have you seen any other providers since your last visit? No  PCP : Tower, Audrie Gallus, MD  Allergies:   Allergies  Allergen Reactions   Alendronate Sodium     REACTION: heartburn    Medications: Outpatient Encounter Medications as of 05/30/2020  Medication Sig   amLODipine (NORVASC) 10 MG tablet TAKE 1 TABLET(10 MG) BY MOUTH DAILY   aspirin 81 MG EC tablet Take 81 mg by mouth daily.   buPROPion (WELLBUTRIN XL) 300 MG 24 hr tablet TAKE 1 TABLET(300 MG) BY MOUTH DAILY   FLUoxetine (PROZAC) 40 MG capsule TAKE 1 CAPSULE(40 MG) BY MOUTH DAILY   glipiZIDE (GLUCOTROL XL) 10 MG 24 hr tablet TAKE 1 TABLET(10 MG) BY MOUTH DAILY   Insulin Pen Needle 29G X MISC Use with insulin daily   losartan-hydrochlorothiazide (HYZAAR) 100-25 MG tablet Take 0.5 tablets by mouth daily.   metoprolol succinate (TOPROL-XL) 25 MG 24 hr tablet TAKE 1 TABLET(25 MG) BY MOUTH DAILY   pantoprazole (PROTONIX) 40 MG tablet Take 1 tablet (40 mg total) by mouth daily. (Patient not taking: Reported on 12/07/2019)   raloxifene (EVISTA) 60 MG tablet TAKE 1 TABLET(60 MG) BY MOUTH DAILY   Semaglutide,0.25 or 0.5MG /DOS, (OZEMPIC, 0.25 OR 0.5 MG/DOSE,) 2 MG/1.5ML SOPN Inject 0.25 mg into the skin once a week.   simvastatin (ZOCOR) 20 MG tablet TAKE 1 TABLET(20 MG) BY MOUTH DAILY   No facility-administered encounter medications on file as of 05/30/2020.    Current Diagnosis: Patient Active Problem List   Diagnosis Date Noted   H/O sepsis 12/09/2019   CKD (chronic kidney disease) 11/23/2019   Leukocytosis 11/23/2019   Mobility impaired 10/13/2019   Incontinence of urine 10/12/2019   Grief reaction 03/27/2019   Elevated serum creatinine 06/13/2018   Dysuria 06/13/2018   Hearing loss 01/02/2018   Subclinical  hypothyroidism 10/26/2017   Depression with anxiety 08/24/2016   Falls frequently 08/24/2016   Generalized weakness 08/24/2016   Routine general medical examination at a health care facility 05/16/2015   Breast calcifications on mammogram 03/01/2014   Encounter for Medicare annual wellness exam 05/30/2013   Breast calcification seen on mammogram 12/14/2012   Breast calcification, left 11/28/2012   History of falling 11/27/2012   Poor balance 11/27/2012   Colon cancer screening 05/10/2012   Osteoporosis 06/21/2007   DM (diabetes mellitus), type 2, uncontrolled (HCC) 09/20/2006   Hyperlipidemia associated with type 2 diabetes mellitus (HCC) 09/20/2006   Essential hypertension 09/20/2006   Osteoarthritis 09/20/2006   Reviewed chart for medication changes ahead of medication coordination call.  No OVs, Consults, or hospital visits since last care coordination call / Pharmacist visit. No medication changes indicated  BP Readings from Last 3 Encounters:  05/12/20 140/82  02/05/20 132/60  12/07/19 130/64    Lab Results  Component Value Date   HGBA1C 9.0 (H) 05/12/2020     Multiple attempts made to contact patient. Left voicemail message. Medications listed as needed are based on last fill and taking daily.   Patient obtains medications through Adherence Packaging  30 Days   Last adherence delivery date: 05/12/20 Multivitamin (Women's One A Day) - 1 tablet daily (1 breakfast)  Amlodipine 10mg - 1 tablet daily (1 breakfast) Aspirin 81mg -1 tablet daily (1 evening meal)  Bupropion 300mg  24 hr-1 tablet daily (1 breakfast)  Calcium/Vitamin D (approx. 800 units Vit D/600mg  Calcium)- 1 tablet daily (1 evening meal)  Fluoxetine 40mg  - 1 capsule daily (1 breakfast)  Glipizide 10mg  XL - 1 tablet daily (1 breakfast)  Hydrochlorothiazide 12.5 mg- 1 tablet (1 breakfast) Losartan 50 mg- 1 tablet daily (breakfast) Metoprolol Succinate 25mg - 1 tablet daily (1 evening meal)  Raloxifene 60mg -1  tablet daily (1 breakfast)  Simvastatin 20mg -1 tablet daily (1 evening meal)  Patient declined the following medications last delivery: Januvia 50mg  d/c - replaced with Ozempic  Lantus Solostar U-100 - 10 units daily, replaced with Ozempic Losartan/HCTZ 100-25mg -   tablet daily(  breakfast) - Discontinued. Replaced with individual components  Metformin 1000mg - D/C due to kidney function Ozempic Inj 2/1.5 ML inject 0.25 mg into skin once a week - PAP approved and in route to patient  Patient is due for next adherence delivery on: 06/06/20  Contacted patient and reviewed medications to ensure patient does not need any medications at this time.  This delivery to include: Adherence Packaging  30 Days  Multivitamin (Women's One A Day) - 1 tablet daily (1 breakfast)  Amlodipine 10mg - 1 tablet daily (1 breakfast) Aspirin 81mg -1 tablet daily (1 evening meal)  Bupropion 300mg  24 hr-1 tablet daily (1 breakfast)  Calcium/Vitamin D (approx. 800 units Vit D/600mg  Calcium)- 1 tablet daily (1 evening meal)  Fluoxetine 40mg  - 1 capsule daily (1 breakfast)  Glipizide 10mg  XL - 1 tablet daily (1 breakfast)  Hydrochlorothiazide 12.5 mg- 1 tablet (1 breakfast) Losartan 50 mg- 1 tablet daily (breakfast) Metoprolol Succinate 25mg - 1 tablet daily (1 evening meal)  Raloxifene 60mg -1 tablet daily (1 breakfast)  Simvastatin 20mg -1 tablet daily (1 evening meal)  Patient does not need refills on any medications  Left message stating delivery date of 06/06/20 and that the pharmacy will contact her the morning of delivery.  Follow-Up:  Coordination of Enhanced Pharmacy Services and Pharmacist Review  , CPP notified  , [facility] Clinical Pharmacy Assistant [phone]  I have reviewed the care management and care coordination activities outlined in this encounter and I am certifying that I agree with the content of this note. No further action required.  ,  PharmD Clinical Pharmacist [facility] at [facility] [phone]

## 2020-06-06 ENCOUNTER — Telehealth: Payer: PPO

## 2020-06-13 NOTE — Telephone Encounter (Signed)
Spoke with pt's daughter [name] and she wasn't aware that she missed a phone call appt so she will call and get it r/s. She will also pick up med. Pt's daughter is on Trulicity and knows how to use this med so she doesn't need any teaching. She will pick up med next week

## 2020-06-13 NOTE — Telephone Encounter (Signed)
Per Phil Dopp, pharmacist, contact pt to let her know the medication has been received and she can pick it up at the clinic. She also missed her VV with Marcelino Duster so please give her the scheduling number for the pharmacist so she can RS that apt. If pt needs education for the Ozempic, please make a [place] apt for teaching and send msg to [place], [place]

## 2020-06-13 NOTE — Telephone Encounter (Signed)
Ozempic was received and placed in the refrigerator. Pt was to have apt with pharmacist on 06/06/20 to go over administration but the pt no showed or it was cancelled due to med not being in yet. Sending msg to pharmacist for further instructions.

## 2020-06-16 ENCOUNTER — Encounter: Payer: Self-pay | Admitting: Family Medicine

## 2020-06-16 NOTE — Telephone Encounter (Signed)
Will route to PCP and pharmacist Marcelino Duster for review

## 2020-06-18 ENCOUNTER — Telehealth: Payer: Self-pay

## 2020-06-18 ENCOUNTER — Encounter: Payer: Self-pay | Admitting: Family Medicine

## 2020-06-18 NOTE — Telephone Encounter (Signed)
Reviewed chart to ensure patient has picked up Ozempic and does not need administration counseling. Patient picked up Ozempic on 06/16/20. She contacted office to confirm dose of 0.25 mg weekly. She is familiar with medication and does not need admin counseling.  Follow up: CMA review tolerance and BG log at monthly medication delivery call  Phil Dopp, PharmD Clinical Pharmacist [facility] at [facility] [phone]

## 2020-06-19 NOTE — Telephone Encounter (Signed)
Carrie please see mychart and schedule an appt with any provider,   Will also route to [facility] to check status of referral

## 2020-06-21 ENCOUNTER — Observation Stay: Payer: Medicare HMO

## 2020-06-21 ENCOUNTER — Inpatient Hospital Stay
Admission: EM | Admit: 2020-06-21 | Discharge: 2020-06-26 | DRG: 871 | Disposition: A | Discharge status: Home Health | Payer: Medicare HMO | Attending: Internal Medicine | Admitting: Internal Medicine

## 2020-06-21 ENCOUNTER — Emergency Department: Payer: Medicare HMO

## 2020-06-21 DIAGNOSIS — Z20822 Contact with and (suspected) exposure to covid-19: Secondary | ICD-10-CM | POA: Diagnosis present

## 2020-06-21 DIAGNOSIS — E860 Dehydration: Secondary | ICD-10-CM | POA: Diagnosis present

## 2020-06-21 DIAGNOSIS — Z7982 Long term (current) use of aspirin: Secondary | ICD-10-CM

## 2020-06-21 DIAGNOSIS — I129 Hypertensive chronic kidney disease with stage 1 through stage 4 chronic kidney disease, or unspecified chronic kidney disease: Secondary | ICD-10-CM | POA: Diagnosis present

## 2020-06-21 DIAGNOSIS — I517 Cardiomegaly: Secondary | ICD-10-CM | POA: Diagnosis not present

## 2020-06-21 DIAGNOSIS — Z993 Dependence on wheelchair: Secondary | ICD-10-CM

## 2020-06-21 DIAGNOSIS — F32A Depression, unspecified: Secondary | ICD-10-CM | POA: Diagnosis present

## 2020-06-21 DIAGNOSIS — N1832 Chronic kidney disease, stage 3b: Secondary | ICD-10-CM | POA: Diagnosis not present

## 2020-06-21 DIAGNOSIS — R531 Weakness: Secondary | ICD-10-CM | POA: Diagnosis not present

## 2020-06-21 DIAGNOSIS — R6520 Severe sepsis without septic shock: Secondary | ICD-10-CM | POA: Diagnosis not present

## 2020-06-21 DIAGNOSIS — Z7984 Long term (current) use of oral hypoglycemic drugs: Secondary | ICD-10-CM

## 2020-06-21 DIAGNOSIS — R404 Transient alteration of awareness: Secondary | ICD-10-CM | POA: Diagnosis not present

## 2020-06-21 DIAGNOSIS — I10 Essential (primary) hypertension: Secondary | ICD-10-CM | POA: Diagnosis present

## 2020-06-21 DIAGNOSIS — N390 Urinary tract infection, site not specified: Secondary | ICD-10-CM | POA: Diagnosis present

## 2020-06-21 DIAGNOSIS — Z87440 Personal history of urinary (tract) infections: Secondary | ICD-10-CM

## 2020-06-21 DIAGNOSIS — A4151 Sepsis due to Escherichia coli [E. coli]: Principal | ICD-10-CM | POA: Diagnosis present

## 2020-06-21 DIAGNOSIS — E1169 Type 2 diabetes mellitus with other specified complication: Secondary | ICD-10-CM | POA: Diagnosis not present

## 2020-06-21 DIAGNOSIS — G9341 Metabolic encephalopathy: Secondary | ICD-10-CM | POA: Diagnosis present

## 2020-06-21 DIAGNOSIS — Z794 Long term (current) use of insulin: Secondary | ICD-10-CM

## 2020-06-21 DIAGNOSIS — R509 Fever, unspecified: Secondary | ICD-10-CM | POA: Diagnosis not present

## 2020-06-21 DIAGNOSIS — A419 Sepsis, unspecified organism: Secondary | ICD-10-CM | POA: Diagnosis present

## 2020-06-21 DIAGNOSIS — M810 Age-related osteoporosis without current pathological fracture: Secondary | ICD-10-CM | POA: Diagnosis present

## 2020-06-21 DIAGNOSIS — F419 Anxiety disorder, unspecified: Secondary | ICD-10-CM

## 2020-06-21 DIAGNOSIS — E538 Deficiency of other specified B group vitamins: Secondary | ICD-10-CM | POA: Diagnosis not present

## 2020-06-21 DIAGNOSIS — Z66 Do not resuscitate: Secondary | ICD-10-CM | POA: Diagnosis not present

## 2020-06-21 DIAGNOSIS — E785 Hyperlipidemia, unspecified: Secondary | ICD-10-CM | POA: Diagnosis present

## 2020-06-21 DIAGNOSIS — R21 Rash and other nonspecific skin eruption: Secondary | ICD-10-CM | POA: Diagnosis not present

## 2020-06-21 DIAGNOSIS — R296 Repeated falls: Secondary | ICD-10-CM | POA: Diagnosis not present

## 2020-06-21 DIAGNOSIS — Z9049 Acquired absence of other specified parts of digestive tract: Secondary | ICD-10-CM

## 2020-06-21 DIAGNOSIS — Z888 Allergy status to other drugs, medicaments and biological substances status: Secondary | ICD-10-CM

## 2020-06-21 DIAGNOSIS — E876 Hypokalemia: Secondary | ICD-10-CM

## 2020-06-21 DIAGNOSIS — M549 Dorsalgia, unspecified: Secondary | ICD-10-CM | POA: Diagnosis not present

## 2020-06-21 DIAGNOSIS — Z79899 Other long term (current) drug therapy: Secondary | ICD-10-CM

## 2020-06-21 DIAGNOSIS — E1122 Type 2 diabetes mellitus with diabetic chronic kidney disease: Secondary | ICD-10-CM | POA: Diagnosis present

## 2020-06-21 DIAGNOSIS — I959 Hypotension, unspecified: Secondary | ICD-10-CM | POA: Diagnosis not present

## 2020-06-21 DIAGNOSIS — R739 Hyperglycemia, unspecified: Secondary | ICD-10-CM | POA: Diagnosis not present

## 2020-06-21 DIAGNOSIS — N189 Chronic kidney disease, unspecified: Secondary | ICD-10-CM | POA: Diagnosis present

## 2020-06-21 DIAGNOSIS — E871 Hypo-osmolality and hyponatremia: Secondary | ICD-10-CM | POA: Diagnosis present

## 2020-06-21 DIAGNOSIS — E1140 Type 2 diabetes mellitus with diabetic neuropathy, unspecified: Secondary | ICD-10-CM | POA: Diagnosis present

## 2020-06-21 DIAGNOSIS — Z833 Family history of diabetes mellitus: Secondary | ICD-10-CM

## 2020-06-21 DIAGNOSIS — Z8249 Family history of ischemic heart disease and other diseases of the circulatory system: Secondary | ICD-10-CM

## 2020-06-21 DIAGNOSIS — N3001 Acute cystitis with hematuria: Secondary | ICD-10-CM | POA: Diagnosis present

## 2020-06-21 DIAGNOSIS — R4182 Altered mental status, unspecified: Secondary | ICD-10-CM | POA: Diagnosis not present

## 2020-06-21 DIAGNOSIS — E1165 Type 2 diabetes mellitus with hyperglycemia: Secondary | ICD-10-CM | POA: Diagnosis not present

## 2020-06-21 LAB — CBC WITH DIFFERENTIAL/PLATELET
Abs Immature Granulocytes: 0.07 10*3/uL (ref 0.00–0.07)
Basophils Absolute: 0 10*3/uL (ref 0.0–0.1)
Basophils Relative: 0 %
Eosinophils Absolute: 0 10*3/uL (ref 0.0–0.5)
Eosinophils Relative: 0 %
HCT: 35.9 % — ABNORMAL LOW (ref 36.0–46.0)
Hemoglobin: 11.4 g/dL — ABNORMAL LOW (ref 12.0–15.0)
Immature Granulocytes: 1 %
Lymphocytes Relative: 8 %
Lymphs Abs: 1.2 10*3/uL (ref 0.7–4.0)
MCH: 28 pg (ref 26.0–34.0)
MCHC: 31.8 g/dL (ref 30.0–36.0)
MCV: 88.2 fL (ref 80.0–100.0)
Monocytes Absolute: 1.2 10*3/uL — ABNORMAL HIGH (ref 0.1–1.0)
Monocytes Relative: 8 %
Neutro Abs: 12.5 10*3/uL — ABNORMAL HIGH (ref 1.7–7.7)
Neutrophils Relative %: 83 %
Platelets: 338 10*3/uL (ref 150–400)
RBC: 4.07 MIL/uL (ref 3.87–5.11)
RDW: 13.7 % (ref 11.5–15.5)
WBC: 15.1 10*3/uL — ABNORMAL HIGH (ref 4.0–10.5)
nRBC: 0 % (ref 0.0–0.2)

## 2020-06-21 LAB — COMPREHENSIVE METABOLIC PANEL
ALT: 20 U/L (ref 0–44)
AST: 27 U/L (ref 15–41)
Albumin: 2.7 g/dL — ABNORMAL LOW (ref 3.5–5.0)
Alkaline Phosphatase: 60 U/L (ref 38–126)
Anion gap: 8 (ref 5–15)
BUN: 36 mg/dL — ABNORMAL HIGH (ref 8–23)
CO2: 26 mmol/L (ref 22–32)
Calcium: 10.7 mg/dL — ABNORMAL HIGH (ref 8.9–10.3)
Chloride: 97 mmol/L — ABNORMAL LOW (ref 98–111)
Creatinine, Ser: 1.51 mg/dL — ABNORMAL HIGH (ref 0.44–1.00)
GFR, Estimated: 34 mL/min — ABNORMAL LOW (ref >60)
Glucose, Bld: 215 mg/dL — ABNORMAL HIGH (ref 70–99)
Potassium: 3.7 mmol/L (ref 3.5–5.1)
Sodium: 131 mmol/L — ABNORMAL LOW (ref 135–145)
Total Bilirubin: 0.9 mg/dL (ref 0.3–1.2)
Total Protein: 6.4 g/dL — ABNORMAL LOW (ref 6.5–8.1)

## 2020-06-21 LAB — URINALYSIS, COMPLETE (UACMP) WITH MICROSCOPIC
Bilirubin Urine: NEGATIVE
Glucose, UA: 150 mg/dL — AB
Ketones, ur: NEGATIVE mg/dL
Nitrite: NEGATIVE
Protein, ur: 100 mg/dL — AB
Specific Gravity, Urine: 1.01 (ref 1.005–1.030)
WBC, UA: >50 WBC/hpf — ABNORMAL HIGH (ref 0–5)
pH: 5 (ref 5.0–8.0)

## 2020-06-21 LAB — PHOSPHORUS: Phosphorus: 2.8 mg/dL (ref 2.5–4.6)

## 2020-06-21 LAB — SODIUM, URINE, RANDOM: Sodium, Ur: 80 mmol/L

## 2020-06-21 LAB — PROCALCITONIN: Procalcitonin: 0.17 ng/mL

## 2020-06-21 LAB — CREATININE, URINE, RANDOM: Creatinine, Urine: 48 mg/dL

## 2020-06-21 LAB — RESP PANEL BY RT-PCR (FLU A&B, COVID) ARPGX2
Influenza A by PCR: NEGATIVE
Influenza B by PCR: NEGATIVE
SARS Coronavirus 2 by RT PCR: NEGATIVE

## 2020-06-21 LAB — CBG MONITORING, ED: Glucose-Capillary: 208 mg/dL — ABNORMAL HIGH (ref 70–99)

## 2020-06-21 LAB — TSH: TSH: 2.288 u[IU]/mL (ref 0.350–4.500)

## 2020-06-21 LAB — CK: Total CK: 23 U/L — ABNORMAL LOW (ref 38–234)

## 2020-06-21 LAB — LACTIC ACID, PLASMA: Lactic Acid, Venous: 1.2 mmol/L (ref 0.5–1.9)

## 2020-06-21 LAB — AMMONIA: Ammonia: 20 umol/L (ref 9–35)

## 2020-06-21 LAB — MAGNESIUM: Magnesium: 1.1 mg/dL — ABNORMAL LOW (ref 1.7–2.4)

## 2020-06-21 MED ORDER — INSULIN ASPART 100 UNIT/ML ~~LOC~~ SOLN
0.0000 [IU] | SUBCUTANEOUS | Status: DC
  Administered 2020-06-21: 3 [IU] via SUBCUTANEOUS
  Filled 2020-06-21: qty 1

## 2020-06-21 MED ORDER — LACTATED RINGERS IV SOLN: INTRAVENOUS | Status: DC

## 2020-06-21 MED ORDER — INSULIN ASPART 100 UNIT/ML ~~LOC~~ SOLN
0.0000 [IU] | SUBCUTANEOUS | Status: DC
  Administered 2020-06-22: 3 [IU] via SUBCUTANEOUS
  Administered 2020-06-22 (×2): 1 [IU] via SUBCUTANEOUS
  Administered 2020-06-22 (×2): 5 [IU] via SUBCUTANEOUS
  Administered 2020-06-23: 2 [IU] via SUBCUTANEOUS
  Administered 2020-06-23: 1 [IU] via SUBCUTANEOUS
  Administered 2020-06-23: 9 [IU] via SUBCUTANEOUS
  Filled 2020-06-21 (×8): qty 1

## 2020-06-21 MED ORDER — ACETAMINOPHEN 500 MG PO TABS
1000.0000 mg | ORAL_TABLET | Freq: Once | ORAL | Status: AC
  Administered 2020-06-21: 1000 mg via ORAL
  Filled 2020-06-21: qty 2

## 2020-06-21 MED ORDER — SODIUM CHLORIDE 0.9 % IV SOLN
1.0000 g | Freq: Once | INTRAVENOUS | Status: AC
  Administered 2020-06-21: 1 g via INTRAVENOUS
  Filled 2020-06-21: qty 10

## 2020-06-21 MED ORDER — LACTATED RINGERS IV BOLUS
1000.0000 mL | Freq: Once | INTRAVENOUS | Status: AC
  Administered 2020-06-21: 1000 mL via INTRAVENOUS

## 2020-06-21 MED ORDER — SODIUM CHLORIDE 0.9 % IV SOLN
1.0000 g | INTRAVENOUS | Status: DC
  Filled 2020-06-21: qty 10

## 2020-06-21 NOTE — ED Provider Notes (Signed)
[facility] Emergency Department Provider Note  ____________________________________________   Event Date/Time   First MD Initiated Contact with Patient 06/21/20 2009     (approximate)  I have reviewed the triage vital signs and the nursing notes.   HISTORY  Chief Complaint Weakness (Hyperglycemia, weakness )   HPI Amanda Benson is a 85 y.o. female who past medical history of HTN, HPL, DM conversion, obesity and osteoporosis as well as CKD who presents via EMS from home for assessment of some fatigue and some confusion.  Patient is on provide any significant history on arrival secondary to altered mental status.  Per EMS she is wheelchair-bound at baseline was recently treated for UTI.  I was able to reach her daughter who stated the patient had been feeling poorly over the last couple of days with increasing fatigue and decreased appetite and some nausea.  Patient is wheelchair-bound at baseline but is typically awake and oriented x4 at baseline.  No recent falls or injuries.  No witnessed cough vomiting or other sick symptoms per family.  No other history is immediately available on patient arrival.          Past Medical History:  Diagnosis Date  . Depression   . Diabetes mellitus    type II  . Hyperlipidemia   . Hypertension   . Obesity   . Osteoporosis     Patient Active Problem List   Diagnosis Date Noted  . H/O sepsis 12/09/2019  . CKD (chronic kidney disease) 11/23/2019  . Leukocytosis 11/23/2019  . Mobility impaired 10/13/2019  . Incontinence of urine 10/12/2019  . Grief reaction 03/27/2019  . Elevated serum creatinine 06/13/2018  . Dysuria 06/13/2018  . Hearing loss 01/02/2018  . Subclinical hypothyroidism 10/26/2017  . Depression with anxiety 08/24/2016  . Falls frequently 08/24/2016  . Generalized weakness 08/24/2016  . Routine general medical examination at a health care facility 05/16/2015  . Breast calcifications on  mammogram 03/01/2014  . Encounter for Medicare annual wellness exam 05/30/2013  . Breast calcification seen on mammogram 12/14/2012  . Breast calcification, left 11/28/2012  . History of falling 11/27/2012  . Poor balance 11/27/2012  . Colon cancer screening 05/10/2012  . Osteoporosis 06/21/2007  . DM (diabetes mellitus), type 2, uncontrolled (HCC) 09/20/2006  . Hyperlipidemia associated with type 2 diabetes mellitus (HCC) 09/20/2006  . Essential hypertension 09/20/2006  . Osteoarthritis 09/20/2006    Past Surgical History:  Procedure Laterality Date  . ANKLE SURGERY     left medial malleblus fracture  . CHOLECYSTECTOMY      Prior to Admission medications   Medication Sig Start Date End Date Taking? Authorizing Provider  amLODipine (NORVASC) 10 MG tablet TAKE 1 TABLET(10 MG) BY MOUTH DAILY 01/22/20   Tower, Audrie Gallus, MD  aspirin 81 MG EC tablet Take 81 mg by mouth daily.    [provider]  buPROPion (WELLBUTRIN XL) 300 MG 24 hr tablet TAKE 1 TABLET(300 MG) BY MOUTH DAILY 05/07/20   Tower, Audrie Gallus, MD  cephALEXin (KEFLEX) 500 MG capsule Take 1 capsule (500 mg total) by mouth 2 (two) times daily. 05/30/20   Tower, Audrie Gallus, MD  FLUoxetine (PROZAC) 40 MG capsule TAKE 1 CAPSULE(40 MG) BY MOUTH DAILY 01/22/20   Tower, Idamae Schuller A, MD  glipiZIDE (GLUCOTROL XL) 10 MG 24 hr tablet TAKE 1 TABLET(10 MG) BY MOUTH DAILY 01/22/20   Tower, Audrie Gallus, MD  Insulin Pen Needle 29G X MISC Use with insulin daily 01/22/20  Tower, Audrie GallusMarne A, MD  losartan-hydrochlorothiazide (HYZAAR) 100-25 MG tablet Take 0.5 tablets by mouth daily. 04/11/20   Tower, Audrie GallusMarne A, MD  metoprolol succinate (TOPROL-XL) 25 MG 24 hr tablet TAKE 1 TABLET(25 MG) BY MOUTH DAILY 01/22/20   Tower, Audrie GallusMarne A, MD  pantoprazole (PROTONIX) 40 MG tablet Take 1 tablet (40 mg total) by mouth daily. Patient not taking: Reported on 12/07/2019 11/30/19 12/30/19  Arrien, York RamMauricio Daniel, MD  raloxifene (EVISTA) 60 MG tablet TAKE 1 TABLET(60 MG) BY  MOUTH DAILY 05/07/20   Tower, Audrie GallusMarne A, MD  Semaglutide,0.25 or 0.5MG /DOS, (OZEMPIC, 0.25 OR 0.5 MG/DOSE,) 2 MG/1.5ML SOPN Inject 0.25 mg into the skin once a week. 02/11/20   Tower, Audrie GallusMarne A, MD  simvastatin (ZOCOR) 20 MG tablet TAKE 1 TABLET(20 MG) BY MOUTH DAILY 05/07/20   Tower, Audrie GallusMarne A, MD    Allergies Alendronate sodium  Family History  Problem Relation Age of Onset  . Diabetes Mother   . Hypertension Mother   . Asthma Father   . Hypertension Sister   . Cancer Sister        lung  . Asthma Brother   . COPD Brother   . Cancer Maternal Grandmother        breast   . Asthma Brother   . Diabetes Mellitus II Daughter   . COPD Daughter     Social History Social History   Tobacco Use  . Smoking status: Never Smoker  . Smokeless tobacco: Never Used  Vaping Use  . Vaping Use: Never used  Substance Use Topics  . Alcohol use: No    Alcohol/week: 0.0 standard drinks  . Drug use: No    Review of Systems  Review of Systems  Unable to perform ROS: Mental status change      ____________________________________________   PHYSICAL EXAM:  VITAL SIGNS: ED Triage Vitals [06/21/20 2011]  Enc Vitals Group     BP      Pulse      Resp      Temp      Temp src      SpO2      Weight 132 lb 4.4 oz (60 kg)     Height 5\' 8"  (1.727 m)     Head Circumference      Peak Flow      Pain Score 0     Pain Loc      Pain Edu?      Excl. in GC?    Vitals:   06/21/20 2014  BP: (!) 182/85  Pulse: 63  Resp: 17  Temp: (!) 101 F (38.3 C)  SpO2: 98%   Physical Exam Vitals and nursing note reviewed.  Constitutional:      General: She is not in acute distress.    Appearance: She is well-developed. She is ill-appearing.  HENT:     Head: Normocephalic and atraumatic.     Right Ear: External ear normal.     Left Ear: External ear normal.     Nose: Nose normal.     Mouth/Throat:     Mouth: Mucous membranes are dry.  Eyes:     Conjunctiva/sclera: Conjunctivae normal.   Cardiovascular:     Rate and Rhythm: Normal rate and regular rhythm.     Pulses: Normal pulses.     Heart sounds: No murmur heard.   Pulmonary:     Effort: Pulmonary effort is normal. No respiratory distress.     Breath sounds: Normal breath sounds.  Abdominal:  Palpations: Abdomen is soft.     Tenderness: There is no abdominal tenderness.  Musculoskeletal:     Cervical back: Neck supple.  Skin:    General: Skin is warm and dry.     Capillary Refill: Capillary refill takes 2 to 3 seconds.  Neurological:     Mental Status: She is alert. She is disoriented.     Patient is able to move her extremities with symmetric strength.  She withdraws all extremities to noxious stimuli.  PERRLA.  EOMI. ____________________________________________   LABS (all labs ordered are listed, but only abnormal results are displayed)  Labs Reviewed  CBC WITH DIFFERENTIAL/PLATELET - Abnormal; Notable for the following components:      Result Value   WBC 15.1 (*)    Hemoglobin 11.4 (*)    HCT 35.9 (*)    Neutro Abs 12.5 (*)    Monocytes Absolute 1.2 (*)    All other components within normal limits  COMPREHENSIVE METABOLIC PANEL - Abnormal; Notable for the following components:   Sodium 131 (*)    Chloride 97 (*)    Glucose, Bld 215 (*)    BUN 36 (*)    Creatinine, Ser 1.51 (*)    Calcium 10.7 (*)    Total Protein 6.4 (*)    Albumin 2.7 (*)    GFR, Estimated 34 (*)    All other components within normal limits  URINALYSIS, COMPLETE (UACMP) WITH MICROSCOPIC - Abnormal; Notable for the following components:   Color, Urine YELLOW (*)    APPearance CLOUDY (*)    Glucose, UA 150 (*)    Hgb urine dipstick SMALL (*)    Protein, ur 100 (*)    Leukocytes,Ua LARGE (*)    WBC, UA >50 (*)    Bacteria, UA RARE (*)    All other components within normal limits  CBG MONITORING, ED - Abnormal; Notable for the following components:   Glucose-Capillary 208 (*)    All other components within normal  limits  RESP PANEL BY RT-PCR (FLU A&B, COVID) ARPGX2  CULTURE, BLOOD (SINGLE)  URINE CULTURE  TSH  LACTIC ACID, PLASMA  AMMONIA  LACTIC ACID, PLASMA  PROTIME-INR  APTT  CBG MONITORING, ED   ____________________________________________  EKG  Sinus rhythm with ventricular rate of 63, left bundle branch block, unremarkable intervals without clear evidence of acute ischemia ____________________________________________  RADIOLOGY  ED MD interpretation: Head CT shows no acute intracranial abnormality.  Chest x-ray shows no pneumothorax, effusion, edema or other acute intrathoracic process.  Official radiology report(s): DG Chest 2 View  Result Date: 06/21/2020 CLINICAL DATA:  85 year old female with fever and weakness EXAM: CHEST - 2 VIEW COMPARISON:  Chest radiograph dated 11/26/2019. FINDINGS: No focal consolidation, pleural effusion or pneumothorax. Mild cardiomegaly. Atherosclerotic calcification of the aorta. No acute osseous pathology. IMPRESSION: No active cardiopulmonary disease. Electronically Signed   By: Elgie Collard M.D.   On: 06/21/2020 21:24   CT Head Wo Contrast  Result Date: 06/21/2020 CLINICAL DATA:  Mental status change. Unknown cause. Hyperglycemia and weakness. EXAM: CT HEAD WITHOUT CONTRAST TECHNIQUE: Contiguous axial images were obtained from the base of the skull through the vertex without intravenous contrast. COMPARISON:  None. FINDINGS: Brain: No evidence of large-territorial acute infarction. No parenchymal hemorrhage. No mass lesion. No extra-axial collection. No mass effect or midline shift. No hydrocephalus. Basilar cisterns are patent. Vascular: No hyperdense vessel. Atherosclerotic calcifications are present within the cavernous internal carotid arteries. Skull: No acute fracture or focal lesion. Sinuses/Orbits: Paranasal  sinuses and mastoid air cells are clear. The orbits are unremarkable. Other: None. IMPRESSION: No acute intracranial abnormality.  Electronically Signed   By: Tish Frederickson M.D.   On: 06/21/2020 20:54    ____________________________________________   PROCEDURES  Procedure(s) performed (including Critical Care):  .Critical Care Performed by: Gilles Chiquito, MD Authorized by: Gilles Chiquito, MD   Critical care provider statement:    Critical care time (minutes):  45   Critical care time was exclusive of:  Separately billable procedures and treating other patients   Critical care was necessary to treat or prevent imminent or life-threatening deterioration of the following conditions:  Sepsis   Critical care was time spent personally by me on the following activities:  Discussions with consultants, evaluation of patient's response to treatment, examination of patient, ordering and performing treatments and interventions, ordering and review of laboratory studies, ordering and review of radiographic studies, pulse oximetry, re-evaluation of patient's condition, obtaining history from patient or surrogate and review of old charts     ____________________________________________   INITIAL IMPRESSION / ASSESSMENT AND PLAN / ED COURSE      Patient presents with [place] to history exam for assessment of some weakness confusion and reported nausea over the last couple days.  On arrival she is confused and provide any significant history.  Over she does have a nonfocal neuro exam no obvious foci of infection on exam.  She is febrile at 101 and slight hypertensive with a BP of 185/85 otherwise stable vital signs on room air.  No obvious history or exam findings suggest acute trauma.  Low suspicion for toxic ingestion.  Concern for possible encephalopathy acute infectious process versus metabolic derangements.  CT head is unremarkable for any intracranial hemorrhage or other acute intracranial process and given nonfocal exam Evalose patient for CVA.  Chest x-ray has no evidence of pneumonia or other acute thoracic  process.  CBC remarkable for leukocytosis with WBC count of 15.1 with no acute anemia or other significant derangements.  CMP remarkable for glucose of 215 without acidosis and a creatinine at baseline.  No other significant electrolyte or metabolic derangements.  TSH unremarkable.  Covid is negative.  Lactic acid is nonelevated.  UA is concerning for source of infection with large LE S, 21-50 RBCs, 3-50 WBCs and rare bacteria with 100 protein.  Given fever and elevated white blood cell count with concern for urinary source of infection code sepsis initiated and blood and urine cultures were ordered as well as IV fluids and IV antibiotics.  I will plan to admit to medicine service for further evaluation management.  Suspect patient's urosepsis is likely the source of her weakness and confusion.       ____________________________________________   FINAL CLINICAL IMPRESSION(S) / ED DIAGNOSES  Final diagnoses:  Sepsis without acute organ dysfunction, due to unspecified organism (HCC)  Acute cystitis with hematuria    Medications  insulin aspart (novoLOG) injection 0-15 Units (3 Units Subcutaneous Given 06/21/20 2110)  cefTRIAXone (ROCEPHIN) 1 g in sodium chloride 0.9 % 100 mL IVPB (1 g Intravenous New Bag/Given 06/21/20 2129)  lactated ringers infusion (has no administration in time range)  acetaminophen (TYLENOL) tablet 1,000 mg (1,000 mg Oral Given 06/21/20 2028)  lactated ringers bolus 1,000 mL (1,000 mLs Intravenous New Bag/Given 06/21/20 2028)     ED Discharge Orders    None       Note:  This document was prepared using Dragon voice recognition software and may include unintentional dictation  errors.   Gilles Chiquito, MD 06/21/20 2144

## 2020-06-21 NOTE — Progress Notes (Signed)
CODE SEPSIS - PHARMACY COMMUNICATION  **Broad Spectrum Antibiotics should be administered within 1 hour of Sepsis diagnosis**  Time Code Sepsis Called/Page Received: 2140  Antibiotics Ordered: ceftriaxone  Time of 1st antibiotic administration: 2129  Additional action taken by pharmacy: none  If necessary, Name of Provider/Nurse Contacted: n/a    Doroteo Glassman ,PharmD Clinical Pharmacist  06/21/2020  9:42 PM

## 2020-06-21 NOTE — ED Triage Notes (Signed)
Weakness , hyperglycemia, recent uti history

## 2020-06-21 NOTE — ED Notes (Signed)
Pt incontinent urine, pericare provided. Cranberry juice and additional warm blanket given

## 2020-06-21 NOTE — H&P (Addendum)
Amanda Benson HLK:[id number] DOB: 11/03/1935 DOA: 06/21/2020     PCP: Amanda Greenspan, MD   Outpatient Specialists:    NEphrology: will start in April.   Patient arrived to ER on 06/21/20 at 2007 Referred by Attending Lucrezia Starch, MD   Patient coming from: home Lives   With family    Chief Complaint:   Chief Complaint  Patient presents with  . Weakness    Hyperglycemia, weakness     HPI: Amanda Benson is a 85 y.o. female with medical history significant of DM2, HTN,  HLD    Presented with   generalized weakness mild confusion.  She has been having some nausea decreased p.o. intake no recent falls 4 days ago started to have Lower abd pain family was concerned she may have a UTI though she just recently finished antibiotics family unsure what. Family denies any fevers or chills no cough At baseline wheelchair-bound. Family did state that when she had one episode of vomiting there could have been something pink in there but no coffee ground. This was not confirmed Infectious risk factors:  Reports  N/V abdominal pain,    severe fatigue    Has  been vaccinated against COVID     Initial COVID TEST   in house  PCR testing  Pending  Lab Results  Component Value Date   [place] NEGATIVE 11/23/2019    Regarding pertinent Chronic problems:    Hyperlipidemia -  on statins Zocor Lipid Panel     Component Value Date/Time   CHOL 132 02/05/2020 1600   TRIG 182.0 (H) 02/05/2020 1600   HDL 40.80 02/05/2020 1600   CHOLHDL 3 02/05/2020 1600   VLDL 36.4 02/05/2020 1600   LDLCALC 54 02/05/2020 1600   LDLDIRECT 59.0 06/09/2018 0910     HTN on losartan -hct 50-12.5 metoprolol xl 25 mg daily and amlodipine 10 mg    DM 2 -  Lab Results  Component Value Date   HGBA1C 9.0 (H) 05/12/2020   on  ozempic once  A week     CKD stage IIIb- baseline Cr 1.7 Estimated Creatinine Clearance: 27.2 mL/min (A) (by C-G formula based on SCr of 1.51 mg/dL (H)).  Lab Results   Component Value Date   CREATININE 1.51 (H) 06/21/2020   CREATININE 1.76 (H) 05/12/2020   CREATININE 1.61 (H) 02/05/2020     While in ER: In emergency department appear to be confused unable to provide her own history Febrile up to 101 with elevated white blood cell count UA worrisome for persistent UTI started on Rocephin    ED Triage Vitals  Enc Vitals Group     BP 06/21/20 2014 (!) 182/85     Pulse Rate 06/21/20 2014 63     Resp 06/21/20 2014 17     Temp 06/21/20 2014 (!) 101 F (38.3 C)     Temp Source 06/21/20 2014 Oral     SpO2 06/21/20 2014 98 %     Weight 06/21/20 2011 132 lb 4.4 oz (60 kg)     Height 06/21/20 2011 $RemoveBefor'5\' 8"'dMAliWXpMQRj$  (1.727 m)     Head Circumference --      Peak Flow --      Pain Score 06/21/20 2011 0     Pain Loc --      Pain Edu? --      Excl. in [place]? --   TMAX(24)@     _________________________________________ Significant initial  Findings: Abnormal Labs Reviewed  CBC WITH DIFFERENTIAL/PLATELET - Abnormal; Notable for the following components:      Result Value   WBC 15.1 (*)    Hemoglobin 11.4 (*)    HCT 35.9 (*)    Neutro Abs 12.5 (*)    Monocytes Absolute 1.2 (*)    All other components within normal limits  COMPREHENSIVE METABOLIC PANEL - Abnormal; Notable for the following components:   Sodium 131 (*)    Chloride 97 (*)    Glucose, Bld 215 (*)    BUN 36 (*)    Creatinine, Ser 1.51 (*)    Calcium 10.7 (*)    Total Protein 6.4 (*)    Albumin 2.7 (*)    GFR, Estimated 34 (*)    All other components within normal limits  URINALYSIS, COMPLETE (UACMP) WITH MICROSCOPIC - Abnormal; Notable for the following components:   Color, Urine YELLOW (*)    APPearance CLOUDY (*)    Glucose, UA 150 (*)    Hgb urine dipstick SMALL (*)    Protein, ur 100 (*)    Leukocytes,Ua LARGE (*)    WBC, UA >50 (*)    Bacteria, UA RARE (*)    All other components within normal limits  CBG MONITORING, ED - Abnormal; Notable for the following components:    Glucose-Capillary 208 (*)    All other components within normal limits   ____________________________________________ Ordered CT HEAD NON acute  CXR - NON acute   _________________________   ECG: Ordered Personally reviewed by me showing: HR : 63 Rhythm: LBBB,     no evidence of ischemic changes QTC 441 ____________________ This patient meets SIRS Criteria and may be septic.    The recent clinical data is shown below. Vitals:   06/21/20 2011 06/21/20 2014  BP:  (!) 182/85  Pulse:  63  Resp:  17  Temp:  (!) 101 F (38.3 C)  TempSrc:  Oral  SpO2:  98%  Weight: 60 kg   Height: $Remove'5\' 8"'BSwXCjf$  (1.727 m)       WBC     Component Value Date/Time   WBC 15.1 (H) 06/21/2020 2020   LYMPHSABS 1.2 06/21/2020 2020   MONOABS 1.2 (H) 06/21/2020 2020   EOSABS 0.0 06/21/2020 2020   BASOSABS 0.0 06/21/2020 2020     Lactic Acid, Venous    Component Value Date/Time   LATICACIDVEN 1.2 06/21/2020 2021    Procalcitonin   Ordered     UA evidence of UTI      Urine analysis:    Component Value Date/Time   COLORURINE YELLOW (A) 06/21/2020 2021   APPEARANCEUR CLOUDY (A) 06/21/2020 2021   LABSPEC 1.010 06/21/2020 2021   PHURINE 5.0 06/21/2020 2021   GLUCOSEU 150 (A) 06/21/2020 2021   HGBUR SMALL (A) 06/21/2020 2021   HGBUR trace-intact 05/01/2008 [place] 06/21/2020 2021   BILIRUBINUR Negative 05/28/2020 [place] 06/21/2020 2021   PROTEINUR 100 (A) 06/21/2020 2021   UROBILINOGEN 0.2 05/28/2020 1707   UROBILINOGEN 0.2 05/01/2008 1218   NITRITE NEGATIVE 06/21/2020 2021   LEUKOCYTESUR LARGE (A) 06/21/2020 2021    Results for orders placed or performed in visit on 05/28/20  Urine Culture     Status: Abnormal   Collection Time: 05/28/20  5:09 PM   Specimen: Urine  Result Value Ref Range Status   MICRO NUMBER: [id number]  Final   SPECIMEN QUALITY: Adequate  Final   Sample Source NOT GIVEN  Final   STATUS: FINAL  Final   ISOLATE 1: Escherichia coli  (A)  Final    Comment: Greater than 100,000 CFU/mL of Escherichia coli      Susceptibility   Escherichia coli - URINE CULTURE, REFLEX    AMOX/CLAVULANIC <=2 Sensitive     AMPICILLIN <=2 Sensitive     AMPICILLIN/SULBACTAM <=2 Sensitive     CEFAZOLIN* <=4 Not Reportable      * For infections other than uncomplicated UTIcaused by E. coli, K. pneumoniae or P. mirabilis:Cefazolin is resistant if MIC > or = 8 mcg/mL.(Distinguishing susceptible versus intermediatefor isolates with MIC < or = 4 mcg/mL requiresadditional testing.)For uncomplicated UTI caused by E. coli,K. pneumoniae or P. mirabilis: Cefazolin issusceptible if MIC <32 mcg/mL and predictssusceptible to the oral agents cefaclor, cefdinir,cefpodoxime, cefprozil, cefuroxime, cephalexinand loracarbef.    CEFEPIME <=1 Sensitive     CEFTRIAXONE <=1 Sensitive     CIPROFLOXACIN <=0.25 Sensitive     LEVOFLOXACIN <=0.12 Sensitive     ERTAPENEM <=0.5 Sensitive     GENTAMICIN <=1 Sensitive     IMIPENEM <=0.25 Sensitive     NITROFURANTOIN <=16 Sensitive     PIP/TAZO <=4 Sensitive     TOBRAMYCIN <=1 Sensitive     TRIMETH/SULFA* <=20 Sensitive      * For infections other than uncomplicated UTIcaused by E. coli, K. pneumoniae or P. mirabilis:Cefazolin is resistant if MIC > or = 8 mcg/mL.(Distinguishing susceptible versus intermediatefor isolates with MIC < or = 4 mcg/mL requiresadditional testing.)For uncomplicated UTI caused by E. coli,K. pneumoniae or P. mirabilis: Cefazolin issusceptible if MIC <32 mcg/mL and predictssusceptible to the oral agents cefaclor, cefdinir,cefpodoxime, cefprozil, cefuroxime, cephalexinand loracarbef.Legend:S = Susceptible  I = IntermediateR = Resistant  NS = Not susceptible* = Not tested  NR = Not reported**NN = See antimicrobic comments     _______________________________________________ Hospitalist was called for admission for Sepsis due to UTI  The following Work up has been ordered so far:  Orders Placed This  Encounter  Procedures  . Critical Care  . Resp Panel by RT-PCR (Flu A&B, Covid) Nasopharyngeal Swab  . Blood culture (routine single)  . Urine culture  . DG Chest 2 View  . CT Head Wo Contrast  . CBC with Differential  . Comprehensive metabolic panel  . TSH  . Urinalysis, Complete w Microscopic  . Lactic acid, plasma  . Protime-INR  . APTT  . Ammonia  . Diet NPO time specified  . In and Out Cath  . Cardiac monitoring  . Document height and weight  . Assess and Document Glasgow Coma Scale  . Document vital signs within 1-hour of fluid bolus completion.  Notify provider of abnormal vital signs despite fluid resuscitation.  . Refer to Sidebar Report: Sepsis Bundle ED/IP  . Notify provider for difficulties obtaining IV access  . Initiate Carrier Fluid Protocol  . STAT CBG when hypoglycemia is suspected. If treated, recheck every 15 minutes after each treatment until CBG >/= 70 mg/dl  . Refer to Hypoglycemia Protocol Sidebar Report for treatment of CBG < 70 mg/dl  . DO NOT delay antibiotics if unable to obtain blood culture.  . pharmacy consult  . Consult to hospitalist  . Code Sepsis activation.  This occurs automatically when order is signed and prioritizes pharmacy, lab, and radiology services for STAT collections and interventions.  If [facility] downtime, call [facility] [phone]) to activate Code Sepsis.  [facility] pharmacy consult  . Airborne and Contact precautions  . Pulse oximetry, continuous  . CBG monitoring, ED  . POC  CBG, ED  . EKG 12-Lead  . EKG 12-Lead  . Insert peripheral IV X 1  . Insert 2nd peripheral IV if not already present.     Following Medications were ordered in ER: Medications  insulin aspart (novoLOG) injection 0-15 Units (3 Units Subcutaneous Given 06/21/20 2110)  cefTRIAXone (ROCEPHIN) 1 g in sodium chloride 0.9 % 100 mL IVPB (1 g Intravenous New Bag/Given 06/21/20 2129)  lactated ringers infusion (has no administration in time range)  acetaminophen (TYLENOL)  tablet 1,000 mg (1,000 mg Oral Given 06/21/20 2028)  lactated ringers bolus 1,000 mL (1,000 mLs Intravenous New Bag/Given 06/21/20 2028)        Consult Orders  (From admission, onward)         Start     Ordered   06/21/20 2140  Consult to hospitalist  Once       Provider:  (Not yet assigned)  Question Answer Comment  Place call to: [phone]   Reason for Consult Admit   Diagnosis/Clinical Info for Consult: urosepsis      06/21/20 2139            OTHER Significant initial  Findings:  labs showing:    Recent Labs  Lab 06/21/20 2020  NA 131*  K 3.7  CO2 26  GLUCOSE 215*  BUN 36*  CREATININE 1.51*  CALCIUM 10.7*    Cr  stable,   Lab Results  Component Value Date   CREATININE 1.51 (H) 06/21/2020   CREATININE 1.76 (H) 05/12/2020   CREATININE 1.61 (H) 02/05/2020    Recent Labs  Lab 06/21/20 2020  AST 27  ALT 20  ALKPHOS 60  BILITOT 0.9  PROT 6.4*  ALBUMIN 2.7*   Lab Results  Component Value Date   CALCIUM 10.7 (H) 06/21/2020   PHOS 2.4 12/05/2018          Plt: Lab Results  Component Value Date   PLT 338 06/21/2020       Recent Labs  Lab 06/21/20 2020  WBC 15.1*  NEUTROABS 12.5*  HGB 11.4*  HCT 35.9*  MCV 88.2  PLT 338    HG/HCT  stable,       Component Value Date/Time   HGB 11.4 (L) 06/21/2020 2020   HCT 35.9 (L) 06/21/2020 2020   MCV 88.2 06/21/2020 2020     No results for input(s): LIPASE, AMYLASE in the last 168 hours. Recent Labs  Lab 06/21/20 2021  AMMONIA 20      DM  labs:  HbA1C: Recent Labs    10/12/19 1151 02/05/20 1600 05/12/20 1604  HGBA1C 7.3* 8.0* 9.0*       CBG (last 3)  Recent Labs    06/21/20 2013  GLUCAP 208*       Cultures:    Component Value Date/Time   SDES BLOOD RIGHT HAND 11/26/2019 1217   SPECREQUEST  11/26/2019 1217    BOTTLES DRAWN AEROBIC AND ANAEROBIC Blood Culture adequate volume   CULT  11/26/2019 1217    NO GROWTH 5 DAYS Performed at [facility], [address] [place], [place] [address]    REPTSTATUS 12/01/2019 FINAL 11/26/2019 1217     Radiological Exams on Admission: DG Chest 2 View  Result Date: 06/21/2020 CLINICAL DATA:  85 year old female with fever and weakness EXAM: CHEST - 2 VIEW COMPARISON:  Chest radiograph dated 11/26/2019. FINDINGS: No focal consolidation, pleural effusion or pneumothorax. Mild cardiomegaly. Atherosclerotic calcification of the aorta. No acute osseous pathology. IMPRESSION: No active cardiopulmonary disease.  Electronically Signed   By: Elgie Collard M.D.   On: 06/21/2020 21:24   CT Head Wo Contrast  Result Date: 06/21/2020 CLINICAL DATA:  Mental status change. Unknown cause. Hyperglycemia and weakness. EXAM: CT HEAD WITHOUT CONTRAST TECHNIQUE: Contiguous axial images were obtained from the base of the skull through the vertex without intravenous contrast. COMPARISON:  None. FINDINGS: Brain: No evidence of large-territorial acute infarction. No parenchymal hemorrhage. No mass lesion. No extra-axial collection. No mass effect or midline shift. No hydrocephalus. Basilar cisterns are patent. Vascular: No hyperdense vessel. Atherosclerotic calcifications are present within the cavernous internal carotid arteries. Skull: No acute fracture or focal lesion. Sinuses/Orbits: Paranasal sinuses and mastoid air cells are clear. The orbits are unremarkable. Other: None. IMPRESSION: No acute intracranial abnormality. Electronically Signed   By: Tish Frederickson M.D.   On: 06/21/2020 20:54   _______________________________________________________________________________________________________ Latest  Blood pressure (!) 182/85, pulse 63, temperature (!) 101 F (38.3 C), temperature source Oral, resp. rate 17, height 5\' 8"  (1.727 m), weight 60 kg, SpO2 98 %.   Review of Systems:    Pertinent positives include:  chills, fatigue,  confusion  Constitutional:  No weight loss, night sweats, Fevers, weight loss  HEENT:  No headaches,  Difficulty swallowing,Tooth/dental problems,Sore throat,  No sneezing, itching, ear ache, nasal congestion, post nasal drip,  Cardio-vascular:  No chest pain, Orthopnea, PND, anasarca, dizziness, palpitations.no Bilateral lower extremity swelling  GI:  No heartburn, indigestion, abdominal pain, nausea, vomiting, diarrhea, change in bowel habits, loss of appetite, melena, blood in stool, hematemesis Resp:  no shortness of breath at rest. No dyspnea on exertion, No excess mucus, no productive cough, No non-productive cough, No coughing up of blood.No change in color of mucus.No wheezing. Skin:  no rash or lesions. No jaundice GU:  no dysuria, change in color of urine, no urgency or frequency. No straining to urinate.  No flank pain.  Musculoskeletal:  No joint pain or no joint swelling. No decreased range of motion. No back pain.  Psych:  No change in mood or affect. No depression or anxiety. No memory loss.  Neuro: no localizing neurological complaints, no tingling, no weakness, no double vision, no gait abnormality, no slurred speech, no  All systems reviewed and apart from HOPI all are negative _______________________________________________________________________________________________ Past Medical History:   Past Medical History:  Diagnosis Date  . Depression   . Diabetes mellitus    type II  . Hyperlipidemia   . Hypertension   . Obesity   . Osteoporosis      Past Surgical History:  Procedure Laterality Date  . ANKLE SURGERY     left medial malleblus fracture  . CHOLECYSTECTOMY      Social History:  Ambulatory  wheelchair bound,       reports that she has never smoked. She has never used smokeless tobacco. She reports that she does not drink alcohol and does not use drugs.   Family History:   Family History  Problem Relation Age of Onset  . Diabetes Mother   . Hypertension Mother   . Asthma Father   . Hypertension Sister   . Cancer Sister        lung  .  Asthma Brother   . COPD Brother   . Cancer Maternal Grandmother        breast   . Asthma Brother   . Diabetes Mellitus II Daughter   . COPD Daughter    ____________________________________________________________________________________  Allergies: Allergies  Allergen Reactions  .  Alendronate Sodium     REACTION: heartburn     Prior to Admission medications   Medication Sig Start Date End Date Taking? Authorizing Provider  amLODipine (NORVASC) 10 MG tablet TAKE 1 TABLET(10 MG) BY MOUTH DAILY 01/22/20   Tower, [name], MD  aspirin 81 MG EC tablet Take 81 mg by mouth daily.    [provider]  buPROPion (WELLBUTRIN XL) 300 MG 24 hr tablet TAKE 1 TABLET(300 MG) BY MOUTH DAILY 05/07/20   Tower, [name], MD  cephALEXin (KEFLEX) 500 MG capsule Take 1 capsule (500 mg total) by mouth 2 (two) times daily. 05/30/20   Tower, [name], MD  FLUoxetine (PROZAC) 40 MG capsule TAKE 1 CAPSULE(40 MG) BY MOUTH DAILY 01/22/20   Tower, Roque Lias A, MD  glipiZIDE (GLUCOTROL XL) 10 MG 24 hr tablet TAKE 1 TABLET(10 MG) BY MOUTH DAILY 01/22/20   Tower, [name], MD  Insulin Pen Needle 29G X 12MM MISC Use with insulin daily 01/22/20   Tower, [name], MD  losartan-hydrochlorothiazide (HYZAAR) 100-25 MG tablet Take 0.5 tablets by mouth daily. 04/11/20   Tower, [name], MD  metoprolol succinate (TOPROL-XL) 25 MG 24 hr tablet TAKE 1 TABLET(25 MG) BY MOUTH DAILY 01/22/20   Tower, [name], MD  pantoprazole (PROTONIX) 40 MG tablet Take 1 tablet (40 mg total) by mouth daily. Patient not taking: Reported on 12/07/2019 11/30/19 12/30/19  Arrien, Jimmy Picket, MD  raloxifene (EVISTA) 60 MG tablet TAKE 1 TABLET(60 MG) BY MOUTH DAILY 05/07/20   Tower, [name], MD  Semaglutide,0.25 or 0.5MG /DOS, (OZEMPIC, 0.25 OR 0.5 MG/DOSE,) 2 MG/1.5ML SOPN Inject 0.25 mg into the skin once a week. 02/11/20   Tower, [name], MD  simvastatin (ZOCOR) 20 MG tablet TAKE 1 TABLET(20 MG) BY MOUTH DAILY 05/07/20   Tower, [name], MD     ___________________________________________________________________________________________________ Physical Exam: Vitals with BMI 06/21/2020 05/12/2020 05/12/2020  Height 5\' 8"  - 5' 2.5"  Weight 132 lbs 4 oz - 142 lbs 3 oz  BMI 22.02 - 54.27  Systolic 062 376 283  Diastolic 85 82 72  Pulse 63 - 79     1. General:  in No  Acute distress    Chronically ill -appearing 2. Psychological: Alert and   Oriented 3. Head/ENT:    Dry Mucous Membranes                          Head Non traumatic, neck supple                          Poor Dentition 4. SKIN:   decreased Skin turgor,  Skin clean Dry and intact no rash 5. Heart: Regular rate and rhythm no Murmur, no Rub or gallop 6. Lungs: , no wheezes or crackles   7. Abdomen: Soft, non-tender, Non distended  bowel sounds present 8. Lower extremities: no clubbing, cyanosis, no edema 9. Neurologically Grossly intact, moving all 4 extremities equally  10. MSK: Normal range of motion    Chart has been reviewed  ______________________________________________________________________________________________  Assessment/Plan  85 y.o. female with medical history significant of DM2, HTN,  HLD     Admitted for sepsis due to UTI  Present on Admission: . Sepsis ([place]) -   -SIRS criteria met with  elevated white blood cell count,       Component Value Date/Time   WBC 15.1 (H) 06/21/2020 2020   LYMPHSABS 1.2 06/21/2020 2020  fever   Today's Vitals   06/21/20 2011 06/21/20 2014 06/21/20 2129  BP:  (!) 182/85   Pulse:  63   Resp:  17   Temp:  (!) 101 F (38.3 C)   TempSrc:  Oral   SpO2:  98%   Weight: 60 kg    Height: $Remove'5\' 8"'JypUzjz$  (1.727 m)    PainSc: 0-No pain  0-No pain    -Most likely source being:  Urinary      Patient meeting criteria for Severe sepsis with    evidence of end organ damage/organ dysfunction such as acute metabolic encephalopathy    - Obtain serial lactic acid and procalcitonin level.  - Initiated IV antibiotics   -  await results of blood and urine culture  - Rehydrate aggressively       10:21 PM   . Acute metabolic encephalopathy - most likely multifactorial secondary to combination of  infection   mild dehydration secondary to decreased by mouth intake, polypharmacy   - Will rehydrate   - treat underlining infection   - Hold contributing medications   -   Improvement with IV fluids  - neurological exam appears to be nonfocal but patient unable to cooperate fully    - no history of liver disease ammonia unremarkable  . Essential hypertension - allow permissive hypertension, resume when able metoprolol BID with holding parameters  . DM (diabetes mellitus), type 2, uncontrolled ([place]) -  - Order Sensitive  SSI   -  check TSH and HgA1C  - Hold by mouth and home medications    . CKD (chronic kidney disease) -  -chronic avoid nephrotoxic medications such as NSAIDs, Vanco Zosyn combo,  avoid hypotension, continue to follow renal function  . Acute lower UTI -   treat with Rocephin       await results of urine culture and adjust antibiotic coverage as needed   . Hyponatremia -   - order urine electrolytes, Gently rehydrate with normal saline at 100  Check TSH Hold hydrochlorothiazide and other psychiatric medications for now   Other plan as per orders.  DVT prophylaxis:  SCD       Code Status:    Code Status: Prior   DNR/DNI  as per family  I had personally discussed CODE STATUS with  family    Family Communication:   Family   at  Bedside  plan of care was discussed   With  Daughter   Disposition Plan:   To home once workup is complete and patient is stable   Following barriers for discharge:                            Electrolytes corrected                               Anemia stable                                                            Afebrile, white count improving able to transition to PO antibiotics  Will need to be able to tolerate PO                                                   Would benefit from PT/OT eval prior to DC  Ordered                   Swallow eval - SLP ordered                   Diabetes care coordinator                   Transition of care consulted                   Nutrition    consulted                                       Consults called: none  Admission status:  ED Disposition    ED Disposition Condition [place]: [place] [100120]  Level of Care: Med-Surg [16]  Covid Evaluation: Asymptomatic Screening Protocol (No Symptoms)  Diagnosis: Sepsis [facility]) [[id number]]  Admitting Physician: Toy Baker [[id number]]  Attending Physician: Toy Baker [[id number]]        Obs    Level of care     tele  For  24H       Lab Results  Component Value Date   [place] 06/21/2020     Precautions: admitted as  Covid Negative      PPE: Used by the provider:   N95  eye Goggles,  Gloves     Davidson Palmieri 06/21/2020, 10:53 PM    [facility]     after 2 AM please page floor coverage PA If 7AM-7PM, please contact the day team taking care of the patient using [email or web address]   Patient was evaluated in the context of the global COVID-19 pandemic, which necessitated consideration that the patient might be at risk for infection with the SARS-CoV-2 virus that causes COVID-19. Institutional protocols and algorithms that pertain to the evaluation of patients at risk for COVID-19 are in a state of rapid change based on information released by regulatory bodies including the CDC and federal and state organizations. These policies and algorithms were followed during the patient's care.

## 2020-06-22 DIAGNOSIS — G9341 Metabolic encephalopathy: Secondary | ICD-10-CM | POA: Diagnosis present

## 2020-06-22 DIAGNOSIS — Z20822 Contact with and (suspected) exposure to covid-19: Secondary | ICD-10-CM | POA: Diagnosis present

## 2020-06-22 DIAGNOSIS — E1165 Type 2 diabetes mellitus with hyperglycemia: Secondary | ICD-10-CM | POA: Diagnosis present

## 2020-06-22 DIAGNOSIS — E1122 Type 2 diabetes mellitus with diabetic chronic kidney disease: Secondary | ICD-10-CM | POA: Diagnosis present

## 2020-06-22 DIAGNOSIS — A4151 Sepsis due to Escherichia coli [E. coli]: Secondary | ICD-10-CM | POA: Diagnosis present

## 2020-06-22 DIAGNOSIS — Z66 Do not resuscitate: Secondary | ICD-10-CM | POA: Diagnosis present

## 2020-06-22 DIAGNOSIS — R6520 Severe sepsis without septic shock: Secondary | ICD-10-CM

## 2020-06-22 DIAGNOSIS — F419 Anxiety disorder, unspecified: Secondary | ICD-10-CM

## 2020-06-22 DIAGNOSIS — E876 Hypokalemia: Secondary | ICD-10-CM

## 2020-06-22 DIAGNOSIS — Z993 Dependence on wheelchair: Secondary | ICD-10-CM | POA: Diagnosis not present

## 2020-06-22 DIAGNOSIS — E538 Deficiency of other specified B group vitamins: Secondary | ICD-10-CM | POA: Diagnosis present

## 2020-06-22 DIAGNOSIS — Z9049 Acquired absence of other specified parts of digestive tract: Secondary | ICD-10-CM | POA: Diagnosis not present

## 2020-06-22 DIAGNOSIS — F32A Depression, unspecified: Secondary | ICD-10-CM | POA: Diagnosis present

## 2020-06-22 DIAGNOSIS — N1832 Chronic kidney disease, stage 3b: Secondary | ICD-10-CM | POA: Diagnosis present

## 2020-06-22 DIAGNOSIS — R531 Weakness: Secondary | ICD-10-CM | POA: Diagnosis present

## 2020-06-22 DIAGNOSIS — I129 Hypertensive chronic kidney disease with stage 1 through stage 4 chronic kidney disease, or unspecified chronic kidney disease: Secondary | ICD-10-CM | POA: Diagnosis present

## 2020-06-22 DIAGNOSIS — E871 Hypo-osmolality and hyponatremia: Secondary | ICD-10-CM | POA: Diagnosis present

## 2020-06-22 DIAGNOSIS — E785 Hyperlipidemia, unspecified: Secondary | ICD-10-CM | POA: Diagnosis present

## 2020-06-22 DIAGNOSIS — R296 Repeated falls: Secondary | ICD-10-CM | POA: Diagnosis present

## 2020-06-22 DIAGNOSIS — E860 Dehydration: Secondary | ICD-10-CM | POA: Diagnosis present

## 2020-06-22 DIAGNOSIS — N390 Urinary tract infection, site not specified: Secondary | ICD-10-CM | POA: Diagnosis not present

## 2020-06-22 DIAGNOSIS — M810 Age-related osteoporosis without current pathological fracture: Secondary | ICD-10-CM | POA: Diagnosis present

## 2020-06-22 DIAGNOSIS — I10 Essential (primary) hypertension: Secondary | ICD-10-CM | POA: Diagnosis not present

## 2020-06-22 DIAGNOSIS — N3001 Acute cystitis with hematuria: Secondary | ICD-10-CM | POA: Diagnosis present

## 2020-06-22 DIAGNOSIS — E1169 Type 2 diabetes mellitus with other specified complication: Secondary | ICD-10-CM | POA: Diagnosis present

## 2020-06-22 LAB — CBC WITH DIFFERENTIAL/PLATELET
Abs Immature Granulocytes: 0.04 10*3/uL (ref 0.00–0.07)
Basophils Absolute: 0 10*3/uL (ref 0.0–0.1)
Basophils Relative: 0 %
Eosinophils Absolute: 0 10*3/uL (ref 0.0–0.5)
Eosinophils Relative: 0 %
HCT: 33.3 % — ABNORMAL LOW (ref 36.0–46.0)
Hemoglobin: 10.9 g/dL — ABNORMAL LOW (ref 12.0–15.0)
Immature Granulocytes: 0 %
Lymphocytes Relative: 7 %
Lymphs Abs: 0.9 10*3/uL (ref 0.7–4.0)
MCH: 28.5 pg (ref 26.0–34.0)
MCHC: 32.7 g/dL (ref 30.0–36.0)
MCV: 87.2 fL (ref 80.0–100.0)
Monocytes Absolute: 0.7 10*3/uL (ref 0.1–1.0)
Monocytes Relative: 6 %
Neutro Abs: 10.8 10*3/uL — ABNORMAL HIGH (ref 1.7–7.7)
Neutrophils Relative %: 87 %
Platelets: 307 10*3/uL (ref 150–400)
RBC: 3.82 MIL/uL — ABNORMAL LOW (ref 3.87–5.11)
RDW: 13.9 % (ref 11.5–15.5)
WBC: 12.5 10*3/uL — ABNORMAL HIGH (ref 4.0–10.5)
nRBC: 0 % (ref 0.0–0.2)

## 2020-06-22 LAB — LACTIC ACID, PLASMA: Lactic Acid, Venous: 1.4 mmol/L (ref 0.5–1.9)

## 2020-06-22 LAB — COMPREHENSIVE METABOLIC PANEL
ALT: 19 U/L (ref 0–44)
AST: 24 U/L (ref 15–41)
Albumin: 2.5 g/dL — ABNORMAL LOW (ref 3.5–5.0)
Alkaline Phosphatase: 52 U/L (ref 38–126)
Anion gap: 7 (ref 5–15)
BUN: 34 mg/dL — ABNORMAL HIGH (ref 8–23)
CO2: 27 mmol/L (ref 22–32)
Calcium: 10.1 mg/dL (ref 8.9–10.3)
Chloride: 100 mmol/L (ref 98–111)
Creatinine, Ser: 1.53 mg/dL — ABNORMAL HIGH (ref 0.44–1.00)
GFR, Estimated: 34 mL/min — ABNORMAL LOW (ref >60)
Glucose, Bld: 197 mg/dL — ABNORMAL HIGH (ref 70–99)
Potassium: 3.2 mmol/L — ABNORMAL LOW (ref 3.5–5.1)
Sodium: 134 mmol/L — ABNORMAL LOW (ref 135–145)
Total Bilirubin: 0.5 mg/dL (ref 0.3–1.2)
Total Protein: 6.1 g/dL — ABNORMAL LOW (ref 6.5–8.1)

## 2020-06-22 LAB — BLOOD CULTURE ID PANEL (REFLEXED) - BCID2

## 2020-06-22 LAB — IRON AND TIBC
Iron: 15 ug/dL — ABNORMAL LOW (ref 28–170)
Saturation Ratios: 6 % — ABNORMAL LOW (ref 10.4–31.8)
TIBC: 260 ug/dL (ref 250–450)
UIBC: 245 ug/dL

## 2020-06-22 LAB — APTT: aPTT: 31 seconds (ref 24–36)

## 2020-06-22 LAB — RETICULOCYTES
Immature Retic Fract: 12.6 % (ref 2.3–15.9)
RBC.: 3.75 MIL/uL — ABNORMAL LOW (ref 3.87–5.11)
Retic Count, Absolute: 94.5 10*3/uL (ref 19.0–186.0)
Retic Ct Pct: 2.5 % (ref 0.4–3.1)

## 2020-06-22 LAB — GLUCOSE, CAPILLARY
Glucose-Capillary: 123 mg/dL — ABNORMAL HIGH (ref 70–99)
Glucose-Capillary: 130 mg/dL — ABNORMAL HIGH (ref 70–99)
Glucose-Capillary: 207 mg/dL — ABNORMAL HIGH (ref 70–99)
Glucose-Capillary: 275 mg/dL — ABNORMAL HIGH (ref 70–99)
Glucose-Capillary: 288 mg/dL — ABNORMAL HIGH (ref 70–99)

## 2020-06-22 LAB — PHOSPHORUS: Phosphorus: 2.4 mg/dL — ABNORMAL LOW (ref 2.5–4.6)

## 2020-06-22 LAB — FOLATE: Folate: 54 ng/mL (ref >5.9)

## 2020-06-22 LAB — OSMOLALITY: Osmolality: 291 mOsm/kg (ref 275–295)

## 2020-06-22 LAB — PROTIME-INR
INR: 1.1 (ref 0.8–1.2)
Prothrombin Time: 14 seconds (ref 11.4–15.2)

## 2020-06-22 LAB — FERRITIN: Ferritin: 78 ng/mL (ref 11–307)

## 2020-06-22 LAB — HEMOGLOBIN A1C
Hgb A1c MFr Bld: 8 % — ABNORMAL HIGH (ref 4.8–5.6)
Mean Plasma Glucose: 182.9 mg/dL

## 2020-06-22 LAB — TSH: TSH: 1.553 u[IU]/mL (ref 0.350–4.500)

## 2020-06-22 LAB — VITAMIN B12: Vitamin B-12: 119 pg/mL — ABNORMAL LOW (ref 180–914)

## 2020-06-22 LAB — MAGNESIUM: Magnesium: 0.9 mg/dL — CL (ref 1.7–2.4)

## 2020-06-22 LAB — PREALBUMIN: Prealbumin: 16.1 mg/dL — ABNORMAL LOW (ref 18–38)

## 2020-06-22 MED ORDER — AMLODIPINE BESYLATE 5 MG PO TABS
5.0000 mg | ORAL_TABLET | Freq: Every day | ORAL | Status: DC
  Administered 2020-06-22 – 2020-06-26 (×5): 5 mg via ORAL
  Filled 2020-06-22 (×6): qty 1

## 2020-06-22 MED ORDER — SODIUM CHLORIDE 0.9 % IV SOLN
1.0000 g | Freq: Once | INTRAVENOUS | Status: AC
  Administered 2020-06-22: 1 g via INTRAVENOUS
  Filled 2020-06-22: qty 1

## 2020-06-22 MED ORDER — SODIUM CHLORIDE 0.9 % IV SOLN
2.0000 g | INTRAVENOUS | Status: DC
  Administered 2020-06-22 – 2020-06-23 (×2): 2 g via INTRAVENOUS
  Filled 2020-06-22: qty 2
  Filled 2020-06-22 (×2): qty 20

## 2020-06-22 MED ORDER — ACETAMINOPHEN 650 MG RE SUPP: 650.0000 mg | Freq: Four times a day (QID) | RECTAL | Status: DC | PRN

## 2020-06-22 MED ORDER — FLUOXETINE HCL 20 MG PO CAPS
40.0000 mg | ORAL_CAPSULE | Freq: Every day | ORAL | Status: DC
  Administered 2020-06-22 – 2020-06-26 (×5): 40 mg via ORAL
  Filled 2020-06-22 (×6): qty 2

## 2020-06-22 MED ORDER — ALBUTEROL SULFATE (2.5 MG/3ML) 0.083% IN NEBU
2.5000 mg | INHALATION_SOLUTION | RESPIRATORY_TRACT | Status: DC | PRN
Start: 2020-06-22 — End: 2020-06-26

## 2020-06-22 MED ORDER — K PHOS MONO-SOD PHOS DI & MONO 155-852-130 MG PO TABS
500.0000 mg | ORAL_TABLET | Freq: Once | ORAL | Status: AC
  Administered 2020-06-22: 500 mg via ORAL
  Filled 2020-06-22: qty 2

## 2020-06-22 MED ORDER — CYANOCOBALAMIN 1000 MCG/ML IJ SOLN
1000.0000 ug | Freq: Once | INTRAMUSCULAR | Status: AC
  Administered 2020-06-22: 1000 ug via INTRAMUSCULAR
  Filled 2020-06-22: qty 1

## 2020-06-22 MED ORDER — ACETAMINOPHEN 325 MG PO TABS
650.0000 mg | ORAL_TABLET | Freq: Four times a day (QID) | ORAL | Status: DC | PRN
  Administered 2020-06-22 (×2): 650 mg via ORAL
  Filled 2020-06-22 (×3): qty 2

## 2020-06-22 MED ORDER — MAGNESIUM OXIDE 400 (241.3 MG) MG PO TABS
400.0000 mg | ORAL_TABLET | Freq: Two times a day (BID) | ORAL | Status: DC
  Administered 2020-06-22 – 2020-06-26 (×8): 400 mg via ORAL
  Filled 2020-06-22 (×10): qty 1

## 2020-06-22 MED ORDER — HYDROCODONE-ACETAMINOPHEN 5-325 MG PO TABS: 1.0000 | ORAL_TABLET | ORAL | Status: DC | PRN

## 2020-06-22 MED ORDER — LACTATED RINGERS IV SOLN: INTRAVENOUS | Status: AC

## 2020-06-22 MED ORDER — POTASSIUM CHLORIDE 20 MEQ PO PACK
20.0000 meq | PACK | Freq: Every day | ORAL | Status: DC
  Administered 2020-06-22 – 2020-06-26 (×5): 20 meq via ORAL
  Filled 2020-06-22 (×6): qty 1

## 2020-06-22 MED ORDER — VITAMIN B-12 1000 MCG PO TABS
1000.0000 ug | ORAL_TABLET | Freq: Every day | ORAL | Status: DC
  Administered 2020-06-23 – 2020-06-26 (×4): 1000 ug via ORAL
  Filled 2020-06-22 (×4): qty 1

## 2020-06-22 MED ORDER — MAGNESIUM SULFATE 4 GM/100ML IV SOLN
4.0000 g | Freq: Once | INTRAVENOUS | Status: AC
  Administered 2020-06-22: 4 g via INTRAVENOUS
  Filled 2020-06-22: qty 100

## 2020-06-22 MED ORDER — BUPROPION HCL ER (XL) 150 MG PO TB24
150.0000 mg | ORAL_TABLET | Freq: Every day | ORAL | Status: DC
  Administered 2020-06-22 – 2020-06-26 (×5): 150 mg via ORAL
  Filled 2020-06-22 (×6): qty 1

## 2020-06-22 MED ORDER — SIMVASTATIN 20 MG PO TABS
20.0000 mg | ORAL_TABLET | Freq: Every day | ORAL | Status: DC
  Administered 2020-06-22 – 2020-06-25 (×4): 20 mg via ORAL
  Filled 2020-06-22 (×4): qty 1

## 2020-06-22 NOTE — Progress Notes (Signed)
Patient ID: Amanda Benson, female   DOB: [birth year], 85 y.o.   MRN: [id number] [facility] PROGRESS NOTE  Fabiha Rougeau Devlin IPJ:[id number] DOB: 03-26-1936 DOA: 06/21/2020 PCP: Judy Pimple, MD  HPI/Subjective: Patient came in with weakness.  Admitted with sepsis and now has positive E. coli in blood cultures.  Some burning on urination.  Objective: Vitals:   06/22/20 0550 06/22/20 0824  BP:  128/62  Pulse:  66  Resp:  19  Temp: 100.1 F (37.8 C) 99.2 F (37.3 C)  SpO2:  94%    Intake/Output Summary (Last 24 hours) at 06/22/2020 1233 Last data filed at 06/22/2020 0414 Gross per 24 hour  Intake 1100 ml  Output 150 ml  Net 950 ml   Filed Weights   06/21/20 2011  Weight: 60 kg    ROS: Review of Systems  Respiratory: Negative for shortness of breath.   Cardiovascular: Negative for chest pain.  Gastrointestinal: Negative for abdominal pain, nausea and vomiting.   Exam: Physical Exam HENT:     Head: Normocephalic.     Mouth/Throat:     Pharynx: No oropharyngeal exudate.  Eyes:     General: Lids are normal.     Conjunctiva/sclera: Conjunctivae normal.  Cardiovascular:     Rate and Rhythm: Normal rate and regular rhythm.     Heart sounds: Normal heart sounds, S1 normal and S2 normal.  Pulmonary:     Breath sounds: Normal breath sounds. No decreased breath sounds, wheezing, rhonchi or rales.  Abdominal:     Palpations: Abdomen is soft.     Tenderness: There is no abdominal tenderness.  Musculoskeletal:     Right lower leg: No swelling.     Left lower leg: No swelling.  Skin:    General: Skin is warm.     Findings: No rash.  Neurological:     Mental Status: She is alert and oriented to person, place, and time.       Data Reviewed: Basic Metabolic Panel: Recent Labs  Lab 06/21/20 2020 06/22/20 0201  NA 131* 134*  K 3.7 3.2*  CL 97* 100  CO2 26 27  GLUCOSE 215* 197*  BUN 36* 34*  CREATININE 1.51* 1.53*  CALCIUM 10.7* 10.1  MG 1.1* 0.9*  PHOS 2.8  2.4*   Liver Function Tests: Recent Labs  Lab 06/21/20 2020 06/22/20 0201  AST 27 24  ALT 20 19  ALKPHOS 60 52  BILITOT 0.9 0.5  PROT 6.4* 6.1*  ALBUMIN 2.7* 2.5*    Recent Labs  Lab 06/21/20 2021  AMMONIA 20   CBC: Recent Labs  Lab 06/21/20 2020 06/22/20 0201  WBC 15.1* 12.5*  NEUTROABS 12.5* 10.8*  HGB 11.4* 10.9*  HCT 35.9* 33.3*  MCV 88.2 87.2  PLT 338 307   Cardiac Enzymes: Recent Labs  Lab 06/21/20 2020  CKTOTAL 23*    CBG: Recent Labs  Lab 06/21/20 2013 06/22/20 0056 06/22/20 0411 06/22/20 0823  GLUCAP 208* 207* 123* 130*    Recent Results (from the past 240 hour(s))  Blood culture (routine single)     Status: None (Preliminary result)   Collection Time: 06/21/20  8:20 PM   Specimen: BLOOD  Result Value Ref Range Status   Specimen Description BLOOD LEFT ANTECUBITAL  Final   Special Requests   Final    BOTTLES DRAWN AEROBIC AND ANAEROBIC Blood Culture results may not be optimal due to an inadequate volume of blood received in culture bottles   Culture  Setup Time  Final    Organism ID to follow PEDIATRICS AEROBIC BOTTLE ONLY GRAM NEGATIVE RODS CRITICAL RESULT CALLED TO, READ BACK BY AND VERIFIED WITH: ALEX CHAPPELL 06/22/20 AT 0849 BY ACR Performed at [facility], [address] [place], [place] [address]    Culture GRAM NEGATIVE RODS  Final   Report Status PENDING  Incomplete  Blood Culture ID Panel (Reflexed)     Status: Abnormal   Collection Time: 06/21/20  8:20 PM  Result Value Ref Range Status   Enterococcus faecalis NOT DETECTED NOT DETECTED Final   Enterococcus Faecium NOT DETECTED NOT DETECTED Final   Listeria monocytogenes NOT DETECTED NOT DETECTED Final   Staphylococcus species NOT DETECTED NOT DETECTED Final   Staphylococcus aureus (BCID) NOT DETECTED NOT DETECTED Final   Staphylococcus epidermidis NOT DETECTED NOT DETECTED Final   Staphylococcus lugdunensis NOT DETECTED NOT DETECTED Final   Streptococcus  species NOT DETECTED NOT DETECTED Final   Streptococcus agalactiae NOT DETECTED NOT DETECTED Final   Streptococcus pneumoniae NOT DETECTED NOT DETECTED Final   Streptococcus pyogenes NOT DETECTED NOT DETECTED Final   A.calcoaceticus-baumannii NOT DETECTED NOT DETECTED Final   Bacteroides fragilis NOT DETECTED NOT DETECTED Final   Enterobacterales DETECTED (A) NOT DETECTED Final    Comment: Enterobacterales represent a large order of gram negative bacteria, not a single organism. CRITICAL RESULT CALLED TO, READ BACK BY AND VERIFIED WITH: ALEX CHAPPELL 06/22/20 AT 0849 BY ACR    Enterobacter cloacae complex NOT DETECTED NOT DETECTED Final   Escherichia coli DETECTED (A) NOT DETECTED Final    Comment: CRITICAL RESULT CALLED TO, READ BACK BY AND VERIFIED WITH: ALEX CHAPPELL 06/22/20 AT 0849 BY ACR    Klebsiella aerogenes NOT DETECTED NOT DETECTED Final   Klebsiella oxytoca NOT DETECTED NOT DETECTED Final   Klebsiella pneumoniae NOT DETECTED NOT DETECTED Final   Proteus species NOT DETECTED NOT DETECTED Final   Salmonella species NOT DETECTED NOT DETECTED Final   Serratia marcescens NOT DETECTED NOT DETECTED Final   Haemophilus influenzae NOT DETECTED NOT DETECTED Final   Neisseria meningitidis NOT DETECTED NOT DETECTED Final   Pseudomonas aeruginosa NOT DETECTED NOT DETECTED Final   Stenotrophomonas maltophilia NOT DETECTED NOT DETECTED Final   Candida albicans NOT DETECTED NOT DETECTED Final   Candida auris NOT DETECTED NOT DETECTED Final   Candida glabrata NOT DETECTED NOT DETECTED Final   Candida krusei NOT DETECTED NOT DETECTED Final   Candida parapsilosis NOT DETECTED NOT DETECTED Final   Candida tropicalis NOT DETECTED NOT DETECTED Final   Cryptococcus neoformans/gattii NOT DETECTED NOT DETECTED Final   CTX-M ESBL NOT DETECTED NOT DETECTED Final   Carbapenem resistance IMP NOT DETECTED NOT DETECTED Final   Carbapenem resistance KPC NOT DETECTED NOT DETECTED Final   Carbapenem  resistance NDM NOT DETECTED NOT DETECTED Final   Carbapenem resist OXA 48 LIKE NOT DETECTED NOT DETECTED Final   Carbapenem resistance VIM NOT DETECTED NOT DETECTED Final    Comment: Performed at [facility], [address] [place], [place] [address]  Resp Panel by RT-PCR (Flu A&B, Covid) Urine, Catheterized     Status: None   Collection Time: 06/21/20  8:21 PM   Specimen: Urine, Catheterized; Nasopharyngeal(NP) swabs in vial transport medium  Result Value Ref Range Status   SARS Coronavirus 2 by RT PCR NEGATIVE NEGATIVE Final    Comment: (NOTE) SARS-CoV-2 target nucleic acids are NOT DETECTED.  The SARS-CoV-2 RNA is generally detectable in upper respiratory specimens during the acute phase of infection.  The lowest concentration of SARS-CoV-2 viral copies this assay can detect is 138 copies/mL. A negative result does not preclude SARS-Cov-2 infection and should not be used as the sole basis for treatment or other patient management decisions. A negative result may occur with  improper specimen collection/handling, submission of specimen other than nasopharyngeal swab, presence of viral mutation(s) within the areas targeted by this assay, and inadequate number of viral copies(<138 copies/mL). A negative result must be combined with clinical observations, patient history, and epidemiological information. The expected result is Negative.  Fact Sheet for Patients:  BloggerCourse.com  Fact Sheet for Healthcare Providers:  SeriousBroker.it  This test is no t yet approved or cleared by the [place] FDA and  has been authorized for detection and/or diagnosis of SARS-CoV-2 by FDA under an Emergency Use Authorization (EUA). This EUA will remain  in effect (meaning this test can be used) for the duration of the COVID-19 declaration under Section 564(b)(1) of the Act, 21 U.S.C.section 360bbb-3(b)(1), unless the authorization is  terminated  or revoked sooner.       Influenza A by PCR NEGATIVE NEGATIVE Final   Influenza B by PCR NEGATIVE NEGATIVE Final    Comment: (NOTE) The Xpert Xpress SARS-CoV-2/FLU/RSV plus assay is intended as an aid in the diagnosis of influenza from Nasopharyngeal swab specimens and should not be used as a sole basis for treatment. Nasal washings and aspirates are unacceptable for Xpert Xpress SARS-CoV-2/FLU/RSV testing.  Fact Sheet for Patients: BloggerCourse.com  Fact Sheet for Healthcare Providers: SeriousBroker.it  This test is not yet approved or cleared by the [place] FDA and has been authorized for detection and/or diagnosis of SARS-CoV-2 by FDA under an Emergency Use Authorization (EUA). This EUA will remain in effect (meaning this test can be used) for the duration of the COVID-19 declaration under Section 564(b)(1) of the Act, 21 U.S.C. section 360bbb-3(b)(1), unless the authorization is terminated or revoked.  Performed at [facility], [address] [place], [place] [address]      Studies: DG Chest 2 View  Result Date: 06/21/2020 CLINICAL DATA:  85 year old female with fever and weakness EXAM: CHEST - 2 VIEW COMPARISON:  Chest radiograph dated 11/26/2019. FINDINGS: No focal consolidation, pleural effusion or pneumothorax. Mild cardiomegaly. Atherosclerotic calcification of the aorta. No acute osseous pathology. IMPRESSION: No active cardiopulmonary disease. Electronically Signed   By: Elgie Collard M.D.   On: 06/21/2020 21:24   DG Abd 1 View  Result Date: 06/21/2020 CLINICAL DATA:  Sepsis EXAM: ABDOMEN - 1 VIEW COMPARISON:  CT 11/23/2019 FINDINGS: Prior cholecystectomy. Mildly prominent small bowel loops in the lower abdomen. Gas and stool seen throughout the colon. No organomegaly or free air. No suspicious calcification. Visualized lung bases clear. IMPRESSION: Mildly prominent small bowel loops  in the lower abdomen. This could reflect mild ileus or early small bowel obstruction. Electronically Signed   By: Charlett Nose M.D.   On: 06/21/2020 22:47   CT Head Wo Contrast  Result Date: 06/21/2020 CLINICAL DATA:  Mental status change. Unknown cause. Hyperglycemia and weakness. EXAM: CT HEAD WITHOUT CONTRAST TECHNIQUE: Contiguous axial images were obtained from the base of the skull through the vertex without intravenous contrast. COMPARISON:  None. FINDINGS: Brain: No evidence of large-territorial acute infarction. No parenchymal hemorrhage. No mass lesion. No extra-axial collection. No mass effect or midline shift. No hydrocephalus. Basilar cisterns are patent. Vascular: No hyperdense vessel. Atherosclerotic calcifications are present within the cavernous internal carotid arteries. Skull: No acute fracture or focal  lesion. Sinuses/Orbits: Paranasal sinuses and mastoid air cells are clear. The orbits are unremarkable. Other: None. IMPRESSION: No acute intracranial abnormality. Electronically Signed   By: Tish Frederickson M.D.   On: 06/21/2020 20:54    Scheduled Meds: . amLODipine  5 mg Oral Daily  . buPROPion  150 mg Oral Daily  . cyanocobalamin  1,000 mcg Intramuscular Once  . FLUoxetine  40 mg Oral Daily  . insulin aspart  0-9 Units Subcutaneous Q4H  . magnesium oxide  400 mg Oral BID  . phosphorus  500 mg Oral Once  . potassium chloride  20 mEq Oral Daily  . simvastatin  20 mg Oral q1800  . [START ON 06/23/2020] vitamin B-12  1,000 mcg Oral Daily   Continuous Infusions: . cefTRIAXone (ROCEPHIN)  IV    . lactated ringers 75 mL/hr at 06/22/20 0132    Assessment/Plan:  1. E. coli severe sepsis, present on admission.  Patient had acute metabolic encephalopathy, leukocytosis, fever of 101.  Patient was initially started on Rocephin and we increased the dose to 2 g daily.  Follow-up cultures and sensitivities.  Acute metabolic encephalopathy has resolved. 2. Essential hypertension.   Restart Norvasc 5 mg daily 3. Chronic kidney disease stage IIIb.  Monitor closely with IV fluids. 4. Severe hypomagnesmia replaced 4 g IV magnesium and oral magnesium.  Recheck level tomorrow 5. Hypokalemia replace potassium orally 6. Vitamin B12 deficiency we will give a IM B12 injection and oral B12 starting tomorrow. 7. Anxiety depression continue fluoxetine and Wellbutrin 8. Weakness.  Physical therapy evaluation.        Code Status:     Code Status Orders  (From admission, onward)         Start     Ordered   06/22/20 0105  Do not attempt resuscitation (DNR)  Continuous       Question Answer Comment  In the event of cardiac or respiratory ARREST Do not call a "code blue"   In the event of cardiac or respiratory ARREST Do not perform Intubation, CPR, defibrillation or ACLS   In the event of cardiac or respiratory ARREST Use medication by any route, position, wound care, and other measures to relive pain and suffering. May use oxygen, suction and manual treatment of airway obstruction as needed for comfort.   Comments CODE STATUS was discussed with patient and her daughter at the bedside and she is a DO NOT RESUSCITATE      06/22/20 0104        Code Status History    Date Active Date Inactive Code Status Order ID Comments User Context   11/23/2019 1420 11/30/2019 1909 DNR [id number]  Lucile Shutters, MD ED   Advance Care Planning Activity     Family Communication: Spoke with daughter at the bedside Disposition Plan: Status is: Inpatient   Dispo: The patient is from: Home              Anticipated d/c is to: Home              Patient currently requiring IV antibiotics for severe E. coli sepsis   Difficult to place patient.  No.  Antibiotics:  Rocephin  Time spent: 28 minutes  [employer]

## 2020-06-22 NOTE — Progress Notes (Signed)
PHARMACY - PHYSICIAN COMMUNICATION CRITICAL VALUE ALERT - BLOOD CULTURE IDENTIFICATION (BCID)  Amanda Benson is an 85 y.o. female who presented to [facility] on 06/21/2020 with a chief complaint of UTI  Assessment:  1/1 bottles GNR. BCID detected E coli. No resistance detected. Suspect urinary source  Name of physician contacted: Dr. Renae Gloss  Current antibiotics: Ceftriaxone 1 g IV q24h  Changes to prescribed antibiotics recommended:  Increase ceftriaxone to 2 g IV q24h for bacteremia  Results for orders placed or performed during the hospital encounter of 06/21/20  Blood Culture ID Panel (Reflexed) (Collected: 06/21/2020  8:20 PM)  Result Value Ref Range   Enterococcus faecalis NOT DETECTED NOT DETECTED   Enterococcus Faecium NOT DETECTED NOT DETECTED   Listeria monocytogenes NOT DETECTED NOT DETECTED   Staphylococcus species NOT DETECTED NOT DETECTED   Staphylococcus aureus (BCID) NOT DETECTED NOT DETECTED   Staphylococcus epidermidis NOT DETECTED NOT DETECTED   Staphylococcus lugdunensis NOT DETECTED NOT DETECTED   Streptococcus species NOT DETECTED NOT DETECTED   Streptococcus agalactiae NOT DETECTED NOT DETECTED   Streptococcus pneumoniae NOT DETECTED NOT DETECTED   Streptococcus pyogenes NOT DETECTED NOT DETECTED   A.calcoaceticus-baumannii NOT DETECTED NOT DETECTED   Bacteroides fragilis NOT DETECTED NOT DETECTED   Enterobacterales DETECTED (A) NOT DETECTED   Enterobacter cloacae complex NOT DETECTED NOT DETECTED   Escherichia coli DETECTED (A) NOT DETECTED   Klebsiella aerogenes NOT DETECTED NOT DETECTED   Klebsiella oxytoca NOT DETECTED NOT DETECTED   Klebsiella pneumoniae NOT DETECTED NOT DETECTED   Proteus species NOT DETECTED NOT DETECTED   Salmonella species NOT DETECTED NOT DETECTED   Serratia marcescens NOT DETECTED NOT DETECTED   Haemophilus influenzae NOT DETECTED NOT DETECTED   Neisseria meningitidis NOT DETECTED NOT DETECTED   Pseudomonas aeruginosa NOT  DETECTED NOT DETECTED   Stenotrophomonas maltophilia NOT DETECTED NOT DETECTED   Candida albicans NOT DETECTED NOT DETECTED   Candida auris NOT DETECTED NOT DETECTED   Candida glabrata NOT DETECTED NOT DETECTED   Candida krusei NOT DETECTED NOT DETECTED   Candida parapsilosis NOT DETECTED NOT DETECTED   Candida tropicalis NOT DETECTED NOT DETECTED   Cryptococcus neoformans/gattii NOT DETECTED NOT DETECTED   CTX-M ESBL NOT DETECTED NOT DETECTED   Carbapenem resistance IMP NOT DETECTED NOT DETECTED   Carbapenem resistance KPC NOT DETECTED NOT DETECTED   Carbapenem resistance NDM NOT DETECTED NOT DETECTED   Carbapenem resist OXA 48 LIKE NOT DETECTED NOT DETECTED   Carbapenem resistance VIM NOT DETECTED NOT DETECTED    [name] 06/22/2020  8:52 AM

## 2020-06-22 NOTE — Progress Notes (Signed)
PT Cancellation Note  Patient Details Name: Amanda Benson MRN: [id number] DOB: 1935/08/04   Cancelled Treatment:    Reason Eval/Treat Not Completed: Fatigue/lethargy limiting ability to participate;Patient's level of consciousness. Chart reviewed, RN consulted. Pt sleeping soundly in bed, does not awaken to entry knock or voice. Lunch tray nearby uneaten. Will attempt again at later date and time once patient is better able to participate.       Jarae Nemmers C 06/22/2020, 2:09 PM

## 2020-06-22 NOTE — Progress Notes (Signed)
Pt being followed by [facility] for sepsis protocol. 

## 2020-06-22 NOTE — Progress Notes (Signed)
Inpatient Diabetes Program Recommendations  AACE/ADA: New Consensus Statement on Inpatient Glycemic Control (2015)  Target Ranges:  Prepandial:   less than 140 mg/dL      Peak postprandial:   less than 180 mg/dL (1-2 hours)      Critically ill patients:  140 - 180 mg/dL   Lab Results  Component Value Date   GLUCAP 130 (H) 06/22/2020   HGBA1C 9.0 (H) 05/12/2020    Review of Glycemic Control Results for LIANETTE, BROUSSARD (MRN [id number]) as of 06/22/2020 09:03  Ref. Range 06/21/2020 20:13 06/22/2020 00:56 06/22/2020 04:11 06/22/2020 08:23  Glucose-Capillary Latest Ref Range: 70 - 99 mg/dL 601 (H) 561 (H) 537 (H) 130 (H)   Diabetes history: DM2 Outpatient Diabetes medications: Glipizide 10 mg daily, Ozempic 0.25 mg weekly Current orders for Inpatient glycemic control: Novolog 0-9 units Q4H  Inpatient Diabetes Program Recommendations:     If patient is eating might consider,   Novolog 0-9 units TID.  Will continue to follow while inpatient.  Thank you, Dulce Sellar, RN, BSN Diabetes Coordinator Inpatient Diabetes Program [phone] (team pager from 8a-5p)

## 2020-06-23 LAB — BASIC METABOLIC PANEL
Anion gap: 9 (ref 5–15)
BUN: 32 mg/dL — ABNORMAL HIGH (ref 8–23)
CO2: 25 mmol/L (ref 22–32)
Calcium: 9.1 mg/dL (ref 8.9–10.3)
Chloride: 96 mmol/L — ABNORMAL LOW (ref 98–111)
Creatinine, Ser: 1.51 mg/dL — ABNORMAL HIGH (ref 0.44–1.00)
GFR, Estimated: 34 mL/min — ABNORMAL LOW (ref >60)
Glucose, Bld: 328 mg/dL — ABNORMAL HIGH (ref 70–99)
Potassium: 3.4 mmol/L — ABNORMAL LOW (ref 3.5–5.1)
Sodium: 130 mmol/L — ABNORMAL LOW (ref 135–145)

## 2020-06-23 LAB — PHOSPHORUS: Phosphorus: 1.8 mg/dL — ABNORMAL LOW (ref 2.5–4.6)

## 2020-06-23 LAB — MAGNESIUM: Magnesium: 1.8 mg/dL (ref 1.7–2.4)

## 2020-06-23 LAB — GLUCOSE, CAPILLARY
Glucose-Capillary: 143 mg/dL — ABNORMAL HIGH (ref 70–99)
Glucose-Capillary: 143 mg/dL — ABNORMAL HIGH (ref 70–99)
Glucose-Capillary: 186 mg/dL — ABNORMAL HIGH (ref 70–99)
Glucose-Capillary: 250 mg/dL — ABNORMAL HIGH (ref 70–99)
Glucose-Capillary: 259 mg/dL — ABNORMAL HIGH (ref 70–99)
Glucose-Capillary: 397 mg/dL — ABNORMAL HIGH (ref 70–99)

## 2020-06-23 LAB — OSMOLALITY, URINE: Osmolality, Ur: 387 mOsm/kg (ref 300–900)

## 2020-06-23 MED ORDER — POTASSIUM PHOSPHATES 15 MMOLE/5ML IV SOLN
30.0000 mmol | Freq: Once | INTRAVENOUS | Status: AC
  Administered 2020-06-23: 30 mmol via INTRAVENOUS
  Filled 2020-06-23: qty 10

## 2020-06-23 MED ORDER — ENSURE ENLIVE PO LIQD
237.0000 mL | Freq: Two times a day (BID) | ORAL | Status: DC
  Administered 2020-06-24 – 2020-06-26 (×4): 237 mL via ORAL

## 2020-06-23 MED ORDER — INSULIN ASPART 100 UNIT/ML ~~LOC~~ SOLN
0.0000 [IU] | Freq: Three times a day (TID) | SUBCUTANEOUS | Status: DC
  Administered 2020-06-23 – 2020-06-24 (×2): 3 [IU] via SUBCUTANEOUS
  Administered 2020-06-24 (×2): 7 [IU] via SUBCUTANEOUS
  Administered 2020-06-25: 5 [IU] via SUBCUTANEOUS
  Administered 2020-06-25: 7 [IU] via SUBCUTANEOUS
  Administered 2020-06-25 – 2020-06-26 (×2): 5 [IU] via SUBCUTANEOUS
  Administered 2020-06-26: 2 [IU] via SUBCUTANEOUS
  Filled 2020-06-23 (×9): qty 1

## 2020-06-23 MED ORDER — SODIUM CHLORIDE 0.9 % IV SOLN
INTRAVENOUS | Status: DC | PRN
  Administered 2020-06-23: 250 mL via INTRAVENOUS

## 2020-06-23 MED ORDER — MAGNESIUM SULFATE 2 GM/50ML IV SOLN
2.0000 g | Freq: Once | INTRAVENOUS | Status: AC
  Administered 2020-06-23: 2 g via INTRAVENOUS
  Filled 2020-06-23: qty 50

## 2020-06-23 MED ORDER — INSULIN ASPART 100 UNIT/ML ~~LOC~~ SOLN
3.0000 [IU] | Freq: Three times a day (TID) | SUBCUTANEOUS | Status: DC
  Administered 2020-06-23 – 2020-06-24 (×3): 3 [IU] via SUBCUTANEOUS
  Filled 2020-06-23 (×3): qty 1

## 2020-06-23 MED ORDER — INSULIN ASPART 100 UNIT/ML ~~LOC~~ SOLN
0.0000 [IU] | Freq: Every day | SUBCUTANEOUS | Status: DC
  Administered 2020-06-25: 3 [IU] via SUBCUTANEOUS
  Filled 2020-06-23: qty 1

## 2020-06-23 NOTE — Evaluation (Signed)
Physical Therapy Evaluation Patient Details Name: Amanda Benson MRN: [id number] DOB: [birth year] Today's Date: 06/23/2020   History of Present Illness  Amanda Benson is an 84yoF [facility] weakness, hyperglycemia, AMS- admitted for E. coli sepsis. PMH: DM2, HTN, HLD, CKD3.  Clinical Impression  Pt is a pleasant 85 year old female who was admitted for sepsis. Pt performs transfers and ambulation with mod assist and RW. Brief donned due to incontinence. Short distance performed with RW, however fatigues quickly with decreased control during stand->sit. Pt demonstrates deficits with strength/endurance/mobility/balance. Not at baseline level. Would benefit from skilled PT to address above deficits and promote optimal return to PLOF; recommend transition to STR upon discharge from acute hospitalization.     Follow Up Recommendations SNF    Equipment Recommendations  None recommended by PT    Recommendations for Other Services       Precautions / Restrictions Precautions Precautions: Fall Restrictions Weight Bearing Restrictions: No      Mobility  Bed Mobility Overal bed mobility: Needs Assistance Bed Mobility: Supine to Sit     Supine to sit: Min assist     General bed mobility comments: not performed, received seated in recliner    Transfers Overall transfer level: Needs assistance Equipment used: Rolling walker (2 wheeled) Transfers: Sit to/from Stand Sit to Stand: Mod assist Stand pivot transfers: Max assist;+2 physical assistance       General transfer comment: cues for pushing from seated surface. Once standing, flexed posture noted. RW used. Only able to stand for approx 1-2 minutes prior to fatigue and quick uncontrolled decent back to chair  Ambulation/Gait Ambulation/Gait assistance: Mod assist Gait Distance (Feet): 2 Feet Assistive device: Rolling walker (2 wheeled) Gait Pattern/deviations: Step-to pattern     General Gait Details: able  to take 1 step forward/backward direction using RW. Forward flexed posture and quick fatigue.Unable to further ambulate. Brief donned due to Dietitian Rankin (Stroke Patients Only)       Balance Overall balance assessment: Needs assistance Sitting-balance support: Bilateral upper extremity supported;Feet supported Sitting balance-Leahy Scale: Fair     Standing balance support: Bilateral upper extremity supported;During functional activity Standing balance-Leahy Scale: Poor                               Pertinent Vitals/Pain Pain Assessment: No/denies pain    Home Living Family/patient expects to be discharged to:: Private residence Living Arrangements: Children Available Help at Discharge: Family;Available 24 hours/day Type of Home: House Home Access: Ramped entrance     Home Layout: One level Home Equipment: Bedside commode;Walker - 4 wheels;Shower seat;Grab bars - tub/shower;Wheelchair - manual Additional Comments: rollator    Prior Function Level of Independence: Needs assistance   Gait / Transfers Assistance Needed: Pt's daughter endorses high falls hx in last month. Pt uses 4WW for in house mobility and w/c for community mobility. Per DTR, pt has required 1-2 assist for t/fs in last week.  ADL's / Homemaking Assistance Needed: Pt MOD I for ADL, until last ~1 week requiring assist for toileting/dressing. DTR assist with IADLs        Hand Dominance   Dominant Hand: Left    Extremity/Trunk Assessment   Upper Extremity Assessment Upper Extremity Assessment: Generalized weakness (B UE 3+/5 limited shoulder flexion to grossly 90 degrees)  Lower Extremity Assessment Lower Extremity Assessment: Generalized weakness (B LE grossly 3/5)       Communication   Communication: HOH  Cognition Arousal/Alertness: Awake/alert Behavior During Therapy: WFL for tasks assessed/performed Overall  Cognitive Status: Within Functional Limits for tasks assessed                                        General Comments General comments (skin integrity, edema, etc.): O2 sats at 89% on RA at rest and decreases to 87% with exertion. 1L of O2 reapplied with O2 sats at 90%.    Exercises Other Exercises Other Exercises: Pt and family educated re: OT role, DME recs, d/c recs, HEP, importance of mobility for functional strengthening Other Exercises: LBD, bathing, UBD, self-feeding, sup>sit, sit<>stand, SPTx3, sitting/standing balance/tolerance Other Exercises: seated ther-ex performed on B LE including LAQ, alt marching, AP, hip add, hip abd/add, and SLRs. All ther-ex performed x 10 reps with cues for technique. Would benefit from written HEP   Assessment/Plan    PT Assessment Patient needs continued PT services  PT Problem List Decreased strength;Decreased balance;Decreased mobility;Decreased safety awareness       PT Treatment Interventions Gait training;Therapeutic exercise;Balance training    PT Goals (Current goals can be found in the Care Plan section)  Acute Rehab PT Goals Patient Stated Goal: to return home PT Goal Formulation: With patient Time For Goal Achievement: 07/07/20 Potential to Achieve Goals: Good    Frequency Min 2X/week   Barriers to discharge        Co-evaluation               AM-PAC PT "6 Clicks" Mobility  Outcome Measure Help needed turning from your back to your side while in a flat bed without using bedrails?: A Little Help needed moving from lying on your back to sitting on the side of a flat bed without using bedrails?: A Lot Help needed moving to and from a bed to a chair (including a wheelchair)?: A Lot Help needed standing up from a chair using your arms (e.g., wheelchair or bedside chair)?: A Lot Help needed to walk in hospital room?: Total Help needed climbing 3-5 steps with a railing? : Total 6 Click Score: 11    End of  Session Equipment Utilized During Treatment: Gait belt;Oxygen Activity Tolerance: Patient tolerated treatment well Patient left: in chair;with chair alarm set;with family/visitor present Nurse Communication: Mobility status PT Visit Diagnosis: Muscle weakness (generalized) (M62.81);Difficulty in walking, not elsewhere classified (R26.2)    Time: 5956-3875 PT Time Calculation (min) (ACUTE ONLY): 25 min   Charges:   PT Evaluation $PT Eval Low Complexity: 1 Low PT Treatments $Therapeutic Exercise: 8-22 mins        Elizabeth Palau, PT, DPT [phone]   Jathen Sudano 06/23/2020, 2:37 PM

## 2020-06-23 NOTE — Evaluation (Signed)
Clinical/Bedside Swallow Evaluation Patient Details  Name: Amanda Benson MRN: [id number] Date of Birth: 05/16/1935  Today's Date: 06/23/2020 Time: SLP Start Time (ACUTE ONLY): 1310 SLP Stop Time (ACUTE ONLY): 1340 SLP Time Calculation (min) (ACUTE ONLY): 30 min  Past Medical History:  Past Medical History:  Diagnosis Date  . Depression   . Diabetes mellitus    type II  . Hyperlipidemia   . Hypertension   . Obesity   . Osteoporosis    Past Surgical History:  Past Surgical History:  Procedure Laterality Date  . ANKLE SURGERY     left medial malleblus fracture  . CHOLECYSTECTOMY     HPI:  Per admitting H&P"Amanda Benson is a 85 y.o. female with medical history significant of DM2, HTN,  HLD        Presented with   generalized weakness mild confusion.  She has been having some nausea decreased p.o. intake no recent falls 4 days ago started to have Lower abd pain family was concerned she may have a UTI though she just recently finished antibiotics family unsure what.  Family denies any fevers or chills no cough  At baseline wheelchair-bound.  Family did state that when she had one episode of vomiting there could have been something pink in there but no coffee ground. This was not confirmed   Assessment / Plan / Recommendation Clinical Impression  Bedside swallow eval revealed mild dysphagia but no overt s/s of aspiration noted today. Daughter reports Pt occasionally has coughing episodes while drinking water and other thin liquids. Oral mech exam revealed structures to be functioning adequately. Pt tolerated sips of water via spoon, straw and cup without s/s of aspiration. Vocal quality remained clear and laryngeal elevation appeared adequate. Slow oral transit with solid graham cracker and mild oral residue after the swallow likely secondary to no bottom teeth. Discussed ways to decrease risk of aspiration at home during meals such as sitting up at a table rather than reclined in a chair  and allowing periods of rest as Pt fatigued easily. Rec Dys 3 diet with chopped meats. ST to follow up with toleration of diet and adjust consistency if needed. SLP Visit Diagnosis: Dysphagia, oropharyngeal phase (R13.12)    Aspiration Risk  Mild aspiration risk    Diet Recommendation Dysphagia 3 (Mech soft)   Liquid Administration via: Cup;Straw Medication Administration: Whole meds with puree Supervision: Patient able to self feed Compensations: Minimize environmental distractions;Slow rate;Small sips/bites Postural Changes: Seated upright at 90 degrees;Remain upright for at least 30 minutes after po intake    Other  Recommendations   N/A  Follow up Recommendations   F/U with toleration of diet     Frequency and Duration min 1 x/week  1 week       Prognosis Barriers/Prognosis Comment: Good      Swallow Study   General Date of Onset: 06/21/20 HPI: Per admitting H&P"Amanda Benson is a 85 y.o. female with medical history significant of DM2, HTN,  HLD        Presented with   generalized weakness mild confusion.  She has been having some nausea decreased p.o. intake no recent falls 4 days ago started to have Lower abd pain family was concerned she may have a UTI though she just recently finished antibiotics family unsure what.  Family denies any fevers or chills no cough  At baseline wheelchair-bound.  Family did state that when she had one episode of vomiting there could have been something pink  in there but no coffee ground. This was not confirmed Type of Study: Bedside Swallow Evaluation Diet Prior to this Study: Dysphagia 3 (soft) Temperature Spikes Noted: No Respiratory Status: Nasal cannula History of Recent Intubation: No Behavior/Cognition: Alert;Cooperative;Pleasant mood;Distractible;Requires cueing Oral Cavity Assessment: Within Functional Limits Oral Care Completed by SLP: No Oral Cavity - Dentition: Dentures, top;Missing dentition Vision: Functional for  self-feeding Self-Feeding Abilities: Needs set up;Needs assist Patient Positioning: Upright in chair Baseline Vocal Quality: Normal    Oral/Motor/Sensory Function Overall Oral Motor/Sensory Function: Within functional limits   Ice Chips Ice chips: Within functional limits Presentation: Spoon   Thin Liquid Thin Liquid: Within functional limits Presentation: Cup;Spoon;Straw    Nectar Thick Nectar Thick Liquid: Not tested   Honey Thick Honey Thick Liquid: Not tested   Puree Puree: Within functional limits   Solid     Solid: Impaired Presentation: Self Fed Oral Phase Impairments: Impaired mastication Oral Phase Functional Implications: Oral residue;Prolonged oral transit      Amanda Benson 06/23/2020,2:05 PM

## 2020-06-23 NOTE — Progress Notes (Signed)
Called to room by nurse tech. Patient's O2 sat on room air was between 84-88%. Patient showing no signs of distress and has no complaints of shortness of breath. Patient encouraged to cough and deep breathe. Patient instructed on use of incentive spirometer. Unable to get sats above 88% on room air. O2 at 1L applied via nasal cannula. Patient's sat up to 92%. Will continue to monitor.

## 2020-06-23 NOTE — Progress Notes (Signed)
Inpatient Diabetes Program Recommendations  AACE/ADA: New Consensus Statement on Inpatient Glycemic Control (2015)  Target Ranges:  Prepandial:   less than 140 mg/dL      Peak postprandial:   less than 180 mg/dL (1-2 hours)      Critically ill patients:  140 - 180 mg/dL   Results for LOCHLYN, ZULLO (MRN [id number]) as of 06/23/2020 08:14  Ref. Range 06/22/2020 00:56 06/22/2020 04:11 06/22/2020 08:23 06/22/2020 15:57 06/22/2020 20:55  Glucose-Capillary Latest Ref Range: 70 - 99 mg/dL 150 (H) 569 (H) 794 (H) 288 (H) 275 (H)   Results for DEEDEE, LYBARGER (MRN [id number]) as of 06/23/2020 08:14  Ref. Range 06/23/2020 00:26 06/23/2020 04:16 06/23/2020 07:29  Glucose-Capillary Latest Ref Range: 70 - 99 mg/dL 827 (H) 078 (H) 675 (H)   Results for PAHOLA, DIMMITT (MRN [id number]) as of 06/23/2020 08:14  Ref. Range 05/12/2020 16:04 06/21/2020 20:20  Hemoglobin A1C Latest Ref Range: 4.8 - 5.6 % 9.0 (H) 8.0 (H)     Home DM Meds: Glipizide 10 mg Daily       Ozempic 0.25 mg Qweek  Current Orders: Novolog Sensitive Correction Scale/ SSI (0-9 units) Q4 hours     MD- Please consider while home DM meds are on hold:  1. Change Novolog SSI to TID AC + HS (currently ordered Q4 hours and pt allowed PO diet)  2. Start Novolog Meal Coverage: Novolog 3 units TID with meals Hold if pt eats <50% of meal, Hold if pt NPO     --Will follow patient during hospitalization--  Ambrose Finland RN, MSN, CDE Diabetes Coordinator Inpatient Glycemic Control Team Team Pager: [phone] (8a-5p)

## 2020-06-23 NOTE — Progress Notes (Signed)
Patient ID: Amanda KaufmannShirley A Benson, female   DOB: 11/04/1935, 85 y.o.   MRN: [id number] [facility] PROGRESS NOTE  [name] WUJ:[id number] DOB: 01/20/1936 DOA: 06/21/2020 PCP: Judy Pimpleower, Marne A, MD  HPI/Subjective: Patient feeling a little bit better.  Needed some help to get into the chair.  Initially came in with weakness.  Found to have sepsis.  Objective: Vitals:   06/23/20 0728 06/23/20 1108  BP: (!) 168/59 (!) 135/50  Pulse: 76 70  Resp: 16 16  Temp: 100.1 F (37.8 C) 98.6 F (37 C)  SpO2: 91% 92%    Intake/Output Summary (Last 24 hours) at 06/23/2020 1400 Last data filed at 06/23/2020 0418 Gross per 24 hour  Intake 1390 ml  Output 900 ml  Net 490 ml   Filed Weights   06/21/20 2011  Weight: 60 kg    ROS: Review of Systems  Respiratory: Negative for shortness of breath.   Cardiovascular: Negative for chest pain.  Gastrointestinal: Negative for abdominal pain, nausea and vomiting.  Neurological: Positive for weakness.   Exam: Physical Exam HENT:     Head: Normocephalic.     Mouth/Throat:     Pharynx: No oropharyngeal exudate.  Eyes:     General: Lids are normal.     Conjunctiva/sclera: Conjunctivae normal.  Cardiovascular:     Rate and Rhythm: Normal rate and regular rhythm.     Heart sounds: Normal heart sounds, S1 normal and S2 normal.  Pulmonary:     Breath sounds: Normal breath sounds. No decreased breath sounds, wheezing, rhonchi or rales.  Abdominal:     Palpations: Abdomen is soft.     Tenderness: There is no abdominal tenderness.  Musculoskeletal:     Right ankle: No swelling.     Left ankle: No swelling.  Skin:    General: Skin is warm.     Findings: No rash.  Neurological:     Mental Status: She is alert and oriented to person, place, and time.     Comments: Able to straight leg raise bilaterally.       Data Reviewed: Basic Metabolic Panel: Recent Labs  Lab 06/21/20 2020 06/22/20 0201 06/23/20 1219  NA 131* 134* 130*  K 3.7 3.2*  3.4*  CL 97* 100 96*  CO2 26 27 25   GLUCOSE 215* 197* 328*  BUN 36* 34* 32*  CREATININE 1.51* 1.53* 1.51*  CALCIUM 10.7* 10.1 9.1  MG 1.1* 0.9* 1.8  PHOS 2.8 2.4* 1.8*   Liver Function Tests: Recent Labs  Lab 06/21/20 2020 06/22/20 0201  AST 27 24  ALT 20 19  ALKPHOS 60 52  BILITOT 0.9 0.5  PROT 6.4* 6.1*  ALBUMIN 2.7* 2.5*    Recent Labs  Lab 06/21/20 2021  AMMONIA 20   CBC: Recent Labs  Lab 06/21/20 2020 06/22/20 0201  WBC 15.1* 12.5*  NEUTROABS 12.5* 10.8*  HGB 11.4* 10.9*  HCT 35.9* 33.3*  MCV 88.2 87.2  PLT 338 307   Cardiac Enzymes: Recent Labs  Lab 06/21/20 2020  CKTOTAL 23*    CBG: Recent Labs  Lab 06/22/20 2055 06/23/20 0026 06/23/20 0416 06/23/20 0729 06/23/20 1108  GLUCAP 275* 143* 143* 186* 397*    Recent Results (from the past 240 hour(s))  Blood culture (routine single)     Status: Abnormal (Preliminary result)   Collection Time: 06/21/20  8:20 PM   Specimen: BLOOD  Result Value Ref Range Status   Specimen Description   Final    BLOOD LEFT ANTECUBITAL Performed  at [facility], [address] [place], [place] [address]    Special Requests   Final    BOTTLES DRAWN AEROBIC AND ANAEROBIC Blood Culture results may not be optimal due to an inadequate volume of blood received in culture bottles Performed at [facility], [address] [place], [place] [address]    Culture  Setup Time   Final    PEDIATRICS AEROBIC BOTTLE ONLY GRAM NEGATIVE RODS CRITICAL RESULT CALLED TO, READ BACK BY AND VERIFIED WITH: ALEX CHAPPELL 06/22/20 AT 0849 BY ACR    Culture (A)  Final    ESCHERICHIA COLI SUSCEPTIBILITIES TO FOLLOW Performed at [facility], [address] [place], [place] [address]    Report Status PENDING  Incomplete  Blood Culture ID Panel (Reflexed)     Status: Abnormal   Collection Time: 06/21/20  8:20 PM  Result Value Ref Range Status   Enterococcus faecalis NOT DETECTED NOT DETECTED Final    Enterococcus Faecium NOT DETECTED NOT DETECTED Final   Listeria monocytogenes NOT DETECTED NOT DETECTED Final   Staphylococcus species NOT DETECTED NOT DETECTED Final   Staphylococcus aureus (BCID) NOT DETECTED NOT DETECTED Final   Staphylococcus epidermidis NOT DETECTED NOT DETECTED Final   Staphylococcus lugdunensis NOT DETECTED NOT DETECTED Final   Streptococcus species NOT DETECTED NOT DETECTED Final   Streptococcus agalactiae NOT DETECTED NOT DETECTED Final   Streptococcus pneumoniae NOT DETECTED NOT DETECTED Final   Streptococcus pyogenes NOT DETECTED NOT DETECTED Final   A.calcoaceticus-baumannii NOT DETECTED NOT DETECTED Final   Bacteroides fragilis NOT DETECTED NOT DETECTED Final   Enterobacterales DETECTED (A) NOT DETECTED Final    Comment: Enterobacterales represent a large order of gram negative bacteria, not a single organism. CRITICAL RESULT CALLED TO, READ BACK BY AND VERIFIED WITH: ALEX CHAPPELL 06/22/20 AT 0849 BY ACR    Enterobacter cloacae complex NOT DETECTED NOT DETECTED Final   Escherichia coli DETECTED (A) NOT DETECTED Final    Comment: CRITICAL RESULT CALLED TO, READ BACK BY AND VERIFIED WITH: ALEX CHAPPELL 06/22/20 AT 0849 BY ACR    Klebsiella aerogenes NOT DETECTED NOT DETECTED Final   Klebsiella oxytoca NOT DETECTED NOT DETECTED Final   Klebsiella pneumoniae NOT DETECTED NOT DETECTED Final   Proteus species NOT DETECTED NOT DETECTED Final   Salmonella species NOT DETECTED NOT DETECTED Final   Serratia marcescens NOT DETECTED NOT DETECTED Final   Haemophilus influenzae NOT DETECTED NOT DETECTED Final   Neisseria meningitidis NOT DETECTED NOT DETECTED Final   Pseudomonas aeruginosa NOT DETECTED NOT DETECTED Final   Stenotrophomonas maltophilia NOT DETECTED NOT DETECTED Final   Candida albicans NOT DETECTED NOT DETECTED Final   Candida auris NOT DETECTED NOT DETECTED Final   Candida glabrata NOT DETECTED NOT DETECTED Final   Candida krusei NOT DETECTED NOT  DETECTED Final   Candida parapsilosis NOT DETECTED NOT DETECTED Final   Candida tropicalis NOT DETECTED NOT DETECTED Final   Cryptococcus neoformans/gattii NOT DETECTED NOT DETECTED Final   CTX-M ESBL NOT DETECTED NOT DETECTED Final   Carbapenem resistance IMP NOT DETECTED NOT DETECTED Final   Carbapenem resistance KPC NOT DETECTED NOT DETECTED Final   Carbapenem resistance NDM NOT DETECTED NOT DETECTED Final   Carbapenem resist OXA 48 LIKE NOT DETECTED NOT DETECTED Final   Carbapenem resistance VIM NOT DETECTED NOT DETECTED Final    Comment: Performed at [facility], [address] [place], [place] [address]  Resp Panel by RT-PCR (Flu A&B, Covid) Urine, Catheterized  Status: None   Collection Time: 06/21/20  8:21 PM   Specimen: Urine, Catheterized; Nasopharyngeal(NP) swabs in vial transport medium  Result Value Ref Range Status   SARS Coronavirus 2 by RT PCR NEGATIVE NEGATIVE Final    Comment: (NOTE) SARS-CoV-2 target nucleic acids are NOT DETECTED.  The SARS-CoV-2 RNA is generally detectable in upper respiratory specimens during the acute phase of infection. The lowest concentration of SARS-CoV-2 viral copies this assay can detect is 138 copies/mL. A negative result does not preclude SARS-Cov-2 infection and should not be used as the sole basis for treatment or other patient management decisions. A negative result may occur with  improper specimen collection/handling, submission of specimen other than nasopharyngeal swab, presence of viral mutation(s) within the areas targeted by this assay, and inadequate number of viral copies(<138 copies/mL). A negative result must be combined with clinical observations, patient history, and epidemiological information. The expected result is Negative.  Fact Sheet for Patients:  BloggerCourse.com  Fact Sheet for Healthcare Providers:  SeriousBroker.it  This test is no t yet  approved or cleared by the [place] FDA and  has been authorized for detection and/or diagnosis of SARS-CoV-2 by FDA under an Emergency Use Authorization (EUA). This EUA will remain  in effect (meaning this test can be used) for the duration of the COVID-19 declaration under Section 564(b)(1) of the Act, 21 U.S.C.section 360bbb-3(b)(1), unless the authorization is terminated  or revoked sooner.       Influenza A by PCR NEGATIVE NEGATIVE Final   Influenza B by PCR NEGATIVE NEGATIVE Final    Comment: (NOTE) The Xpert Xpress SARS-CoV-2/FLU/RSV plus assay is intended as an aid in the diagnosis of influenza from Nasopharyngeal swab specimens and should not be used as a sole basis for treatment. Nasal washings and aspirates are unacceptable for Xpert Xpress SARS-CoV-2/FLU/RSV testing.  Fact Sheet for Patients: BloggerCourse.com  Fact Sheet for Healthcare Providers: SeriousBroker.it  This test is not yet approved or cleared by the [place] FDA and has been authorized for detection and/or diagnosis of SARS-CoV-2 by FDA under an Emergency Use Authorization (EUA). This EUA will remain in effect (meaning this test can be used) for the duration of the COVID-19 declaration under Section 564(b)(1) of the Act, 21 U.S.C. section 360bbb-3(b)(1), unless the authorization is terminated or revoked.  Performed at [facility], [address], [place], [place] [address]   Urine culture     Status: Abnormal (Preliminary result)   Collection Time: 06/21/20  8:21 PM   Specimen: In/Out Cath Urine  Result Value Ref Range Status   Specimen Description   Final    IN/OUT CATH URINE Performed at [facility], [address], [place], [place] [address]    Special Requests   Final    NONE Performed at [facility], [address] [place], [place] [address]    Culture (A)  Final    >=100,000 COLONIES/mL  ESCHERICHIA COLI SUSCEPTIBILITIES TO FOLLOW Performed at [facility], [address], [place], [place] [address]    Report Status PENDING  Incomplete     Studies: DG Chest 2 View  Result Date: 06/21/2020 CLINICAL DATA:  85 year old female with fever and weakness EXAM: CHEST - 2 VIEW COMPARISON:  Chest radiograph dated 11/26/2019. FINDINGS: No focal consolidation, pleural effusion or pneumothorax. Mild cardiomegaly. Atherosclerotic calcification of the aorta. No acute osseous pathology. IMPRESSION: No active cardiopulmonary disease. Electronically Signed   By: Elgie Collard M.D.   On: 06/21/2020  21:24   DG Abd 1 View  Result Date: 06/21/2020 CLINICAL DATA:  Sepsis EXAM: ABDOMEN - 1 VIEW COMPARISON:  CT 11/23/2019 FINDINGS: Prior cholecystectomy. Mildly prominent small bowel loops in the lower abdomen. Gas and stool seen throughout the colon. No organomegaly or free air. No suspicious calcification. Visualized lung bases clear. IMPRESSION: Mildly prominent small bowel loops in the lower abdomen. This could reflect mild ileus or early small bowel obstruction. Electronically Signed   By: Charlett Nose M.D.   On: 06/21/2020 22:47   CT Head Wo Contrast  Result Date: 06/21/2020 CLINICAL DATA:  Mental status change. Unknown cause. Hyperglycemia and weakness. EXAM: CT HEAD WITHOUT CONTRAST TECHNIQUE: Contiguous axial images were obtained from the base of the skull through the vertex without intravenous contrast. COMPARISON:  None. FINDINGS: Brain: No evidence of large-territorial acute infarction. No parenchymal hemorrhage. No mass lesion. No extra-axial collection. No mass effect or midline shift. No hydrocephalus. Basilar cisterns are patent. Vascular: No hyperdense vessel. Atherosclerotic calcifications are present within the cavernous internal carotid arteries. Skull: No acute fracture or focal lesion. Sinuses/Orbits: Paranasal sinuses and mastoid air cells are clear. The orbits are  unremarkable. Other: None. IMPRESSION: No acute intracranial abnormality. Electronically Signed   By: Tish Frederickson M.D.   On: 06/21/2020 20:54    Scheduled Meds: . amLODipine  5 mg Oral Daily  . buPROPion  150 mg Oral Daily  . FLUoxetine  40 mg Oral Daily  . insulin aspart  0-9 Units Subcutaneous Q4H  . magnesium oxide  400 mg Oral BID  . potassium chloride  20 mEq Oral Daily  . simvastatin  20 mg Oral q1800  . vitamin B-12  1,000 mcg Oral Daily   Continuous Infusions: . cefTRIAXone (ROCEPHIN)  IV Stopped (06/22/20 2251)  . magnesium sulfate bolus IVPB    . potassium PHOSPHATE IVPB (in mmol)      Assessment/Plan:  1. E. coli severe sepsis, present on admission.  Patient had acute metabolic encephalopathy, leukocytosis and fever of 101.  Continue Rocephin increased dose.  E. coli growing out of blood and urine cultures.  Awaiting sensitivities. 2. Essential hypertension.  Continue Norvasc 3. Chronic kidney disease stage IIIb. 4. Severe hypomagnesemia.  Received 4 g IV magnesium yesterday and oral magnesium.  Will give another 2 g of IV magnesium today on level of 1.8. 5. Hypophosphatemia and hypokalemia we will give K-Phos today 6. Vitamin B12 deficiency.  IM B12 injection yesterday and oral B12 today 7. Anxiety depression continue fluoxetine and Wellbutrin 8. Type 2 diabetes mellitus with hyperlipidemia.  On sliding scale insulin. 9. Weakness.  PT evaluation.      Code Status:     Code Status Orders  (From admission, onward)         Start     Ordered   06/22/20 0105  Do not attempt resuscitation (DNR)  Continuous       Question Answer Comment  In the event of cardiac or respiratory ARREST Do not call a "code blue"   In the event of cardiac or respiratory ARREST Do not perform Intubation, CPR, defibrillation or ACLS   In the event of cardiac or respiratory ARREST Use medication by any route, position, wound care, and other measures to relive pain and suffering. May  use oxygen, suction and manual treatment of airway obstruction as needed for comfort.   Comments CODE STATUS was discussed with patient and her daughter at the bedside and she is a DO NOT RESUSCITATE  06/22/20 0104        Code Status History    Date Active Date Inactive Code Status Order ID Comments User Context   11/23/2019 1420 11/30/2019 1909 DNR [id number]  Lucile Shutters, MD ED   Advance Care Planning Activity     Family Communication: Spoke with daughter at the bedside Disposition Plan: Status is: Inpatient  Dispo: The patient is from: Home              Anticipated d/c is to: Awaiting PT eval for home with home health versus rehab              Patient currently still weak.  Requiring IV antibiotics for E. coli sepsis.   Difficult to place patient.  No.  Antibiotics:  Rocephin  Time spent: 28 minutes  [employer]

## 2020-06-23 NOTE — Evaluation (Signed)
Occupational Therapy Evaluation Patient Details Name: Amanda Benson MRN: [id number] DOB: Nov 05, 1935 Today's Date: 06/23/2020    History of Present Illness Amanda Benson is an 84yoF who comes to [facility] on 3/19 c weakness, hyperglycemia, AMS- admitted for E. coli sepsis. PMH: DM2, HTN, HLD, CKD3.   Clinical Impression   Amanda Benson was seen for OT evaluation this date. Prior to hospital admission, pt was MOD I using 4WW for in house mobility and w/c for community mobility. Per DTR, pt has required 1-2 assist for t/fs in last week 2/2 increased weakenss. Pt's daughter endorses high falls hx in last month. Pt lives with daughter and SIL available 24/7. Pt presents to acute OT demonstrating impaired ADL performance and functional mobility 2/2 decreased activity tolerance, functional strength/balance deficits, and poor insight into deficits.  Pt currently requires SETUP self-feeding at bed level. MAX A x2 for perihygiene in standing. MAX A don/doff B socks seated on BSC. MOD A bathing seated on BSC - assist for BLEs. Pt initially required MAX A bed>chair, upon sitting in chair pt had large semi solid bowel movement. TOTAL A chair>BSC. NT in room to assist with cleaning up and SPT BSC>chair c MAX A x2. Pt would benefit from skilled OT to address noted impairments and functional limitations (see below for any additional details) in order to maximize safety and independence while minimizing falls risk and caregiver burden. Upon hospital discharge, recommend STR to maximize pt safety and return to PLOF.     Follow Up Recommendations  SNF    Equipment Recommendations  Toilet rise with handles    Recommendations for Other Services       Precautions / Restrictions Precautions Precautions: Fall Restrictions Weight Bearing Restrictions: No      Mobility Bed Mobility Overal bed mobility: Needs Assistance Bed Mobility: Supine to Sit     Supine to sit: Min assist     General bed mobility comments:  assist squaring to EOB    Transfers Overall transfer level: Needs assistance Equipment used: 2 person hand held assist Transfers: Sit to/from Stand;Stand Pivot Transfers Sit to Stand: Max assist Stand pivot transfers: Max assist;+2 physical assistance       General transfer comment: Initial MAX A bed>chair, upon sitting in chair pt had large semi solid bowel movement. TOTAL A chair>BSC. NT in room to assist with cleaning up and SPT BSC>chair c MAX A x2.    Balance Overall balance assessment: Needs assistance Sitting-balance support: Bilateral upper extremity supported;Feet supported Sitting balance-Leahy Scale: Fair     Standing balance support: Bilateral upper extremity supported;During functional activity Standing balance-Leahy Scale: Zero                             ADL either performed or assessed with clinical judgement   ADL Overall ADL's : Needs assistance/impaired                                       General ADL Comments: SETUP self-feeding at bed level. MAX A for BSC t/f. MAX A x2 for perihygiene in standing. MAX A don/doff B socks seated on BSC. MOD A bathing seated on BSC - assist for BLEs                  Pertinent Vitals/Pain Pain Assessment: No/denies pain     Hand Dominance Left  Extremity/Trunk Assessment Upper Extremity Assessment Upper Extremity Assessment: Generalized weakness   Lower Extremity Assessment Lower Extremity Assessment: Generalized weakness       Communication Communication Communication: HOH   Cognition Arousal/Alertness: Awake/alert Behavior During Therapy: WFL for tasks assessed/performed Overall Cognitive Status: Within Functional Limits for tasks assessed                                     General Comments  SpO2 89% on 1L [place], desat 86% on RA, resolved to 89% on 1L [place]    Exercises Exercises: Other exercises Other Exercises Other Exercises: Pt and family educated re: OT  role, DME recs, d/c recs, HEP, importance of mobility for functional strengthening Other Exercises: LBD, bathing, UBD, self-feeding, sup>sit, sit<>stand, SPTx3, sitting/standing balance/tolerance   Shoulder Instructions      Home Living Family/patient expects to be discharged to:: Private residence Living Arrangements: Children Available Help at Discharge: Family;Available 24 hours/day Type of Home: House Home Access: Ramped entrance (unsafe ramp (questionable?))     Home Layout: One level     Bathroom Shower/Tub: Producer, television/film/video: Standard Bathroom Accessibility: No   Home Equipment: Bedside commode;Walker - 4 wheels;Shower seat;Grab bars - tub/shower;Wheelchair - manual          Prior Functioning/Environment Level of Independence: Needs assistance  Gait / Transfers Assistance Needed: Pt's daughter endorses high falls hx in last month. Pt uses 4WW for in house mobility and w/c for community mobility. Per DTR, pt has required 1-2 assist for t/fs in last week. ADL's / Homemaking Assistance Needed: Pt MOD I for ADL, until last ~1 week requiring assist for toileting/dressing. DTR assist with IADLs            OT Problem List: Decreased strength;Decreased activity tolerance;Impaired balance (sitting and/or standing);Decreased safety awareness;Decreased knowledge of use of DME or AE      OT Treatment/Interventions: Self-care/ADL training;Therapeutic exercise;Energy conservation;DME and/or AE instruction;Patient/family education;Balance training    OT Goals(Current goals can be found in the care plan section) Acute Rehab OT Goals Patient Stated Goal: to return home OT Goal Formulation: With patient/family Time For Goal Achievement: 07/07/20 Potential to Achieve Goals: Fair ADL Goals Pt Will Perform Grooming: with set-up;with supervision;sitting Pt Will Transfer to Toilet: with min assist;with +2 assist;stand pivot transfer;bedside commode (c LRAD  PRN) Pt/caregiver will Perform Home Exercise Program: Increased strength;Both right and left upper extremity  OT Frequency: Min 2X/week   Barriers to D/C: Inaccessible home environment             AM-PAC OT "6 Clicks" Daily Activity     Outcome Measure Help from another person eating meals?: None Help from another person taking care of personal grooming?: A Little Help from another person toileting, which includes using toliet, bedpan, or urinal?: Total Help from another person bathing (including washing, rinsing, drying)?: A Lot Help from another person to put on and taking off regular upper body clothing?: A Lot Help from another person to put on and taking off regular lower body clothing?: A Lot 6 Click Score: 14   End of Session Equipment Utilized During Treatment: Oxygen Nurse Communication: Mobility status  Activity Tolerance: Patient limited by fatigue Patient left: in chair;with call bell/phone within reach;with chair alarm set;with nursing/sitter in room;with family/visitor present  OT Visit Diagnosis: Other abnormalities of gait and mobility (R26.89);Muscle weakness (generalized) (M62.81);Repeated falls (R29.6)  Time: 5284-1324 OT Time Calculation (min): 47 min Charges:  OT General Charges $OT Visit: 1 Visit OT Evaluation $OT Eval Low Complexity: 1 Low OT Treatments $Self Care/Home Management : 38-52 mins  Kathie Dike, M.S. OTR/L  06/23/20, 12:56 PM  ascom [phone]

## 2020-06-23 NOTE — Progress Notes (Signed)
Initial Nutrition Assessment  DOCUMENTATION CODES:   Not applicable  INTERVENTION:   -Ensure Enlive po BID, each supplement provides 350 kcal and 20 grams of protein -MVI with minerals daily  NUTRITION DIAGNOSIS:   Inadequate oral intake related to decreased appetite as evidenced by meal completion < 50%.  GOAL:   Patient will meet greater than or equal to 90% of their needs  MONITOR:   PO intake,Supplement acceptance,Labs,Weight trends,Diet advancement,Skin,I & O's  REASON FOR ASSESSMENT:   Consult Assessment of nutrition requirement/status  ASSESSMENT:   85 y.o. female with medical history significant of DM2, HTN,  HLD  Pt admitted with sepsis secondary to UTI.   3/21- s/p BSE- advanced to dysphagia 3 diet with thin liquids  Reviewed I/O's: +490 ml x 24 hours and +1.4 L since admission  UOP: 900 ml x 24 hours  Spoke with pt and daughter at bedside. Per daughter, pt has had a poor appetite over the past 3 days, however, is improving (pt consumed about 25-50% of her breakfast and dinner meals today in contrast to pt consuming nothing but an Ensure Enlive shake yesterday). At baseline, pt has a very hearty appetite- she consumes 3 meals per day (meat, starch, and vegetable) and 3 snacks daily (favorite snacks are applesauce and cheeseballs). Per daughter, is not a selective eater, but often has a hard time making her up her mind about what to eat.   Per pt daughter, pt with no weight loss. Her UBW is around 130#. Reviewed wt hx; pt has experienced a 4.1% wt loss over the past 6 months, which is not significant for time frame. Pt with generalized fat and muscle depletions, likely related to advanced age.   Discussed importance of good meal and supplement intake to promote healing. They are amenable to Ensure Enlive supplements.   Medications reviewed and include vitamin B-12.   Labs reviewed: Na: 130, K: 3.4 (on IV supplementation), Phos: 1.8 (on IV supplementation).  CBGS: 143-397.   NUTRITION - FOCUSED PHYSICAL EXAM:  Flowsheet Row Most Recent Value  Orbital Region No depletion  Upper Arm Region Mild depletion  Thoracic and Lumbar Region No depletion  Buccal Region No depletion  Temple Region Mild depletion  Clavicle Bone Region Mild depletion  Clavicle and Acromion Bone Region Mild depletion  Scapular Bone Region No depletion  Dorsal Hand No depletion  Patellar Region Mild depletion  Anterior Thigh Region Mild depletion  Posterior Calf Region Mild depletion  Edema (RD Assessment) None  Hair Reviewed  Eyes Reviewed  Mouth Reviewed  Skin Reviewed  Nails Reviewed       Diet Order:   Diet Order            DIET DYS 3 Room service appropriate? Yes; Fluid consistency: Thin  Diet effective 1400                 EDUCATION NEEDS:   Education needs have been addressed  Skin:  Skin Assessment: Reviewed RN Assessment  Last BM:  06/23/20  Height:   Ht Readings from Last 1 Encounters:  06/21/20 5\' 8"  (1.727 m)    Weight:   Wt Readings from Last 1 Encounters:  06/21/20 60 kg    Ideal Body Weight:  63.6 kg  BMI:  Body mass index is 20.11 kg/m.  Estimated Nutritional Needs:   Kcal:  1600-1800  Protein:  80-95 grams  Fluid:  > 1.6 L    06/23/20, RD, LDN, CDCES Registered Dietitian II Certified Diabetes  Care and Education Specialist Please refer to [facility] for RD and/or RD on-call/weekend/after hours pager

## 2020-06-24 ENCOUNTER — Inpatient Hospital Stay: Payer: Medicare HMO

## 2020-06-24 DIAGNOSIS — E1169 Type 2 diabetes mellitus with other specified complication: Secondary | ICD-10-CM

## 2020-06-24 DIAGNOSIS — E785 Hyperlipidemia, unspecified: Secondary | ICD-10-CM

## 2020-06-24 DIAGNOSIS — E039 Hypothyroidism, unspecified: Secondary | ICD-10-CM | POA: Insufficient documentation

## 2020-06-24 DIAGNOSIS — E119 Type 2 diabetes mellitus without complications: Secondary | ICD-10-CM | POA: Insufficient documentation

## 2020-06-24 LAB — BASIC METABOLIC PANEL
Anion gap: 8 (ref 5–15)
BUN: 25 mg/dL — ABNORMAL HIGH (ref 8–23)
CO2: 26 mmol/L (ref 22–32)
Calcium: 8.7 mg/dL — ABNORMAL LOW (ref 8.9–10.3)
Chloride: 98 mmol/L (ref 98–111)
Creatinine, Ser: 1.36 mg/dL — ABNORMAL HIGH (ref 0.44–1.00)
GFR, Estimated: 39 mL/min — ABNORMAL LOW (ref >60)
Glucose, Bld: 204 mg/dL — ABNORMAL HIGH (ref 70–99)
Potassium: 3.8 mmol/L (ref 3.5–5.1)
Sodium: 132 mmol/L — ABNORMAL LOW (ref 135–145)

## 2020-06-24 LAB — GLUCOSE, CAPILLARY
Glucose-Capillary: 162 mg/dL — ABNORMAL HIGH (ref 70–99)
Glucose-Capillary: 190 mg/dL — ABNORMAL HIGH (ref 70–99)
Glucose-Capillary: 201 mg/dL — ABNORMAL HIGH (ref 70–99)
Glucose-Capillary: 217 mg/dL — ABNORMAL HIGH (ref 70–99)
Glucose-Capillary: 313 mg/dL — ABNORMAL HIGH (ref 70–99)
Glucose-Capillary: 349 mg/dL — ABNORMAL HIGH (ref 70–99)

## 2020-06-24 LAB — CULTURE, BLOOD (SINGLE)

## 2020-06-24 LAB — URINE CULTURE: Culture: >=100000 — AB

## 2020-06-24 LAB — PHOSPHORUS: Phosphorus: 2.3 mg/dL — ABNORMAL LOW (ref 2.5–4.6)

## 2020-06-24 LAB — MAGNESIUM: Magnesium: 2.1 mg/dL (ref 1.7–2.4)

## 2020-06-24 MED ORDER — K PHOS MONO-SOD PHOS DI & MONO 155-852-130 MG PO TABS
250.0000 mg | ORAL_TABLET | Freq: Three times a day (TID) | ORAL | Status: AC
  Administered 2020-06-24 (×3): 250 mg via ORAL
  Filled 2020-06-24 (×3): qty 1

## 2020-06-24 MED ORDER — CEFAZOLIN SODIUM-DEXTROSE 2-4 GM/100ML-% IV SOLN
2.0000 g | Freq: Three times a day (TID) | INTRAVENOUS | Status: DC
  Administered 2020-06-24 – 2020-06-26 (×6): 2 g via INTRAVENOUS
  Filled 2020-06-24 (×9): qty 100

## 2020-06-24 MED ORDER — INSULIN ASPART 100 UNIT/ML ~~LOC~~ SOLN
6.0000 [IU] | Freq: Three times a day (TID) | SUBCUTANEOUS | Status: DC
  Administered 2020-06-24 – 2020-06-26 (×6): 6 [IU] via SUBCUTANEOUS
  Filled 2020-06-24 (×6): qty 1

## 2020-06-24 NOTE — Progress Notes (Signed)
Patient ID: Amanda Benson, female   DOB: May 23, 1935, 85 y.o.   MRN: [id number] [facility] PROGRESS NOTE  Amanda Benson XTK:[id number] DOB: 1935-08-30 DOA: 06/21/2020 PCP: Judy Pimple, MD  HPI/Subjective: Patient feeling okay.  Offers no complaints except for feeling weak.  No burning on urination.  No shortness of breath.  Objective: Vitals:   06/24/20 0741 06/24/20 1159  BP: (!) 158/53 (!) 141/49  Pulse: 76 75  Resp: 15 16  Temp: 99.8 F (37.7 C) 97.9 F (36.6 C)  SpO2: 96% 92%    Intake/Output Summary (Last 24 hours) at 06/24/2020 1508 Last data filed at 06/23/2020 1600 Gross per 24 hour  Intake 85.9 ml  Output -  Net 85.9 ml   Filed Weights   06/21/20 2011  Weight: 60 kg    ROS: Review of Systems  Respiratory: Negative for shortness of breath.   Cardiovascular: Negative for chest pain.  Gastrointestinal: Negative for abdominal pain, nausea and vomiting.   Exam: Physical Exam HENT:     Head: Normocephalic.     Mouth/Throat:     Pharynx: No oropharyngeal exudate.  Eyes:     General: Lids are normal.     Conjunctiva/sclera: Conjunctivae normal.  Cardiovascular:     Rate and Rhythm: Normal rate and regular rhythm.     Heart sounds: Normal heart sounds, S1 normal and S2 normal.  Pulmonary:     Breath sounds: Examination of the right-lower field reveals decreased breath sounds. Examination of the left-lower field reveals decreased breath sounds. Decreased breath sounds present. No wheezing, rhonchi or rales.  Abdominal:     Palpations: Abdomen is soft.     Tenderness: There is no abdominal tenderness.  Musculoskeletal:     Right lower leg: No swelling.     Left lower leg: No swelling.  Skin:    General: Skin is warm.     Findings: No rash.  Neurological:     Mental Status: She is alert and oriented to person, place, and time.       Data Reviewed: Basic Metabolic Panel: Recent Labs  Lab 06/21/20 2020 06/22/20 0201 06/23/20 1219  06/24/20 0727  NA 131* 134* 130* 132*  K 3.7 3.2* 3.4* 3.8  CL 97* 100 96* 98  CO2 26 27 25 26   GLUCOSE 215* 197* 328* 204*  BUN 36* 34* 32* 25*  CREATININE 1.51* 1.53* 1.51* 1.36*  CALCIUM 10.7* 10.1 9.1 8.7*  MG 1.1* 0.9* 1.8 2.1  PHOS 2.8 2.4* 1.8* 2.3*   Liver Function Tests: Recent Labs  Lab 06/21/20 2020 06/22/20 0201  AST 27 24  ALT 20 19  ALKPHOS 60 52  BILITOT 0.9 0.5  PROT 6.4* 6.1*  ALBUMIN 2.7* 2.5*    Recent Labs  Lab 06/21/20 2021  AMMONIA 20   CBC: Recent Labs  Lab 06/21/20 2020 06/22/20 0201  WBC 15.1* 12.5*  NEUTROABS 12.5* 10.8*  HGB 11.4* 10.9*  HCT 35.9* 33.3*  MCV 88.2 87.2  PLT 338 307   Cardiac Enzymes: Recent Labs  Lab 06/21/20 2020  CKTOTAL 23*    CBG: Recent Labs  Lab 06/23/20 2121 06/24/20 0006 06/24/20 0336 06/24/20 0753 06/24/20 1204  GLUCAP 250* 190* 162* 201* 349*    Recent Results (from the past 240 hour(s))  Blood culture (routine single)     Status: Abnormal   Collection Time: 06/21/20  8:20 PM   Specimen: BLOOD  Result Value Ref Range Status   Specimen Description   Final  BLOOD LEFT ANTECUBITAL Performed at [facility], [address] [place], [place] [address]    Special Requests   Final    BOTTLES DRAWN AEROBIC AND ANAEROBIC Blood Culture results may not be optimal due to an inadequate volume of blood received in culture bottles Performed at [facility], [address] [place], [place] [address]    Culture  Setup Time   Final    PEDIATRICS AEROBIC BOTTLE ONLY GRAM NEGATIVE RODS CRITICAL RESULT CALLED TO, READ BACK BY AND VERIFIED WITH: ALEX CHAPPELL 06/22/20 AT 0849 BY ACR Performed at [facility], [address], [place], [place] [address]    Culture ESCHERICHIA COLI (A)  Final   Report Status 06/24/2020 FINAL  Final   Organism ID, Bacteria ESCHERICHIA COLI  Final      Susceptibility   Escherichia coli - MIC*    AMPICILLIN >=32 RESISTANT Resistant     CEFAZOLIN  <=4 SENSITIVE Sensitive     CEFEPIME <=0.12 SENSITIVE Sensitive     CEFTAZIDIME <=1 SENSITIVE Sensitive     CEFTRIAXONE <=0.25 SENSITIVE Sensitive     CIPROFLOXACIN <=0.25 SENSITIVE Sensitive     GENTAMICIN <=1 SENSITIVE Sensitive     IMIPENEM <=0.25 SENSITIVE Sensitive     TRIMETH/SULFA <=20 SENSITIVE Sensitive     AMPICILLIN/SULBACTAM >=32 RESISTANT Resistant     PIP/TAZO <=4 SENSITIVE Sensitive     * ESCHERICHIA COLI  Blood Culture ID Panel (Reflexed)     Status: Abnormal   Collection Time: 06/21/20  8:20 PM  Result Value Ref Range Status   Enterococcus faecalis NOT DETECTED NOT DETECTED Final   Enterococcus Faecium NOT DETECTED NOT DETECTED Final   Listeria monocytogenes NOT DETECTED NOT DETECTED Final   Staphylococcus species NOT DETECTED NOT DETECTED Final   Staphylococcus aureus (BCID) NOT DETECTED NOT DETECTED Final   Staphylococcus epidermidis NOT DETECTED NOT DETECTED Final   Staphylococcus lugdunensis NOT DETECTED NOT DETECTED Final   Streptococcus species NOT DETECTED NOT DETECTED Final   Streptococcus agalactiae NOT DETECTED NOT DETECTED Final   Streptococcus pneumoniae NOT DETECTED NOT DETECTED Final   Streptococcus pyogenes NOT DETECTED NOT DETECTED Final   A.calcoaceticus-baumannii NOT DETECTED NOT DETECTED Final   Bacteroides fragilis NOT DETECTED NOT DETECTED Final   Enterobacterales DETECTED (A) NOT DETECTED Final    Comment: Enterobacterales represent a large order of gram negative bacteria, not a single organism. CRITICAL RESULT CALLED TO, READ BACK BY AND VERIFIED WITH: ALEX CHAPPELL 06/22/20 AT 0849 BY ACR    Enterobacter cloacae complex NOT DETECTED NOT DETECTED Final   Escherichia coli DETECTED (A) NOT DETECTED Final    Comment: CRITICAL RESULT CALLED TO, READ BACK BY AND VERIFIED WITH: ALEX CHAPPELL 06/22/20 AT 0849 BY ACR    Klebsiella aerogenes NOT DETECTED NOT DETECTED Final   Klebsiella oxytoca NOT DETECTED NOT DETECTED Final   Klebsiella pneumoniae  NOT DETECTED NOT DETECTED Final   Proteus species NOT DETECTED NOT DETECTED Final   Salmonella species NOT DETECTED NOT DETECTED Final   Serratia marcescens NOT DETECTED NOT DETECTED Final   Haemophilus influenzae NOT DETECTED NOT DETECTED Final   Neisseria meningitidis NOT DETECTED NOT DETECTED Final   Pseudomonas aeruginosa NOT DETECTED NOT DETECTED Final   Stenotrophomonas maltophilia NOT DETECTED NOT DETECTED Final   Candida albicans NOT DETECTED NOT DETECTED Final   Candida auris NOT DETECTED NOT DETECTED Final   Candida glabrata NOT DETECTED NOT DETECTED Final   Candida krusei NOT DETECTED NOT DETECTED Final  Candida parapsilosis NOT DETECTED NOT DETECTED Final   Candida tropicalis NOT DETECTED NOT DETECTED Final   Cryptococcus neoformans/gattii NOT DETECTED NOT DETECTED Final   CTX-M ESBL NOT DETECTED NOT DETECTED Final   Carbapenem resistance IMP NOT DETECTED NOT DETECTED Final   Carbapenem resistance KPC NOT DETECTED NOT DETECTED Final   Carbapenem resistance NDM NOT DETECTED NOT DETECTED Final   Carbapenem resist OXA 48 LIKE NOT DETECTED NOT DETECTED Final   Carbapenem resistance VIM NOT DETECTED NOT DETECTED Final    Comment: Performed at [facility], [address] [place], [place] [address]  Resp Panel by RT-PCR (Flu A&B, Covid) Urine, Catheterized     Status: None   Collection Time: 06/21/20  8:21 PM   Specimen: Urine, Catheterized; Nasopharyngeal(NP) swabs in vial transport medium  Result Value Ref Range Status   SARS Coronavirus 2 by RT PCR NEGATIVE NEGATIVE Final    Comment: (NOTE) SARS-CoV-2 target nucleic acids are NOT DETECTED.  The SARS-CoV-2 RNA is generally detectable in upper respiratory specimens during the acute phase of infection. The lowest concentration of SARS-CoV-2 viral copies this assay can detect is 138 copies/mL. A negative result does not preclude SARS-Cov-2 infection and should not be used as the sole basis for treatment or other  patient management decisions. A negative result may occur with  improper specimen collection/handling, submission of specimen other than nasopharyngeal swab, presence of viral mutation(s) within the areas targeted by this assay, and inadequate number of viral copies(<138 copies/mL). A negative result must be combined with clinical observations, patient history, and epidemiological information. The expected result is Negative.  Fact Sheet for Patients:  BloggerCourse.com  Fact Sheet for Healthcare Providers:  SeriousBroker.it  This test is no t yet approved or cleared by the [place] FDA and  has been authorized for detection and/or diagnosis of SARS-CoV-2 by FDA under an Emergency Use Authorization (EUA). This EUA will remain  in effect (meaning this test can be used) for the duration of the COVID-19 declaration under Section 564(b)(1) of the Act, 21 U.S.C.section 360bbb-3(b)(1), unless the authorization is terminated  or revoked sooner.       Influenza A by PCR NEGATIVE NEGATIVE Final   Influenza B by PCR NEGATIVE NEGATIVE Final    Comment: (NOTE) The Xpert Xpress SARS-CoV-2/FLU/RSV plus assay is intended as an aid in the diagnosis of influenza from Nasopharyngeal swab specimens and should not be used as a sole basis for treatment. Nasal washings and aspirates are unacceptable for Xpert Xpress SARS-CoV-2/FLU/RSV testing.  Fact Sheet for Patients: BloggerCourse.com  Fact Sheet for Healthcare Providers: SeriousBroker.it  This test is not yet approved or cleared by the [place] FDA and has been authorized for detection and/or diagnosis of SARS-CoV-2 by FDA under an Emergency Use Authorization (EUA). This EUA will remain in effect (meaning this test can be used) for the duration of the COVID-19 declaration under Section 564(b)(1) of the Act, 21 U.S.C. section  360bbb-3(b)(1), unless the authorization is terminated or revoked.  Performed at [facility], [address] [place], [place] [address]   Urine culture     Status: Abnormal   Collection Time: 06/21/20  8:21 PM   Specimen: In/Out Cath Urine  Result Value Ref Range Status   Specimen Description   Final    IN/OUT CATH URINE Performed at [facility], [address] [place], [place] [address]    Special Requests   Final    NONE Performed at [facility], [phone]  [address] [place], [place] [address]    Culture >=100,000 COLONIES/mL ESCHERICHIA COLI (A)  Final   Report Status 06/24/2020 FINAL  Final   Organism ID, Bacteria ESCHERICHIA COLI (A)  Final      Susceptibility   Escherichia coli - MIC*    AMPICILLIN >=32 RESISTANT Resistant     CEFAZOLIN <=4 SENSITIVE Sensitive     CEFEPIME <=0.12 SENSITIVE Sensitive     CEFTRIAXONE <=0.25 SENSITIVE Sensitive     CIPROFLOXACIN <=0.25 SENSITIVE Sensitive     GENTAMICIN <=1 SENSITIVE Sensitive     IMIPENEM <=0.25 SENSITIVE Sensitive     NITROFURANTOIN <=16 SENSITIVE Sensitive     TRIMETH/SULFA <=20 SENSITIVE Sensitive     AMPICILLIN/SULBACTAM >=32 RESISTANT Resistant     PIP/TAZO <=4 SENSITIVE Sensitive     * >=100,000 COLONIES/mL ESCHERICHIA COLI     Studies: US RENAL  Result Date: 06/24/2020 CLINICAL DATA:  Sepsis. EXAM: RENAL / URINARY TRACT ULTRASOUND COMPLETE COMPARISON:  Ultrasound 11/23/2019. FINDINGS: Right Kidney: Renal measurements: 11.4 x 4.6 x 4.8 cm = volume: 132.8 mL. Echogenicity within normal limits. No mass or hydronephrosis visualized. Left Kidney: Renal measurements: 9.9 x 5.4 x 4.1 cm = volume: 113.5 mL. Echogenicity within normal limits. No mass or hydronephrosis visualized. Mild volume loss in the left lower kidney again noted. Findings most consistent scarring. Bladder: Appears normal for degree of bladder distention. Other: Bilateral pleural effusions incidentally noted. IMPRESSION: 1.  No acute renal abnormality identified. No hydronephrosis or bladder distention. Mild volume loss left lower renal pole again noted. Findings most consistent with scarring. Similar findings noted on prior study. 2. Bilateral pleural effusions incidentally noted. Electronically Signed   By: Maisie Fus  Register   On: 06/24/2020 06:41    Scheduled Meds: . amLODipine  5 mg Oral Daily  . buPROPion  150 mg Oral Daily  . feeding supplement  237 mL Oral BID BM  . FLUoxetine  40 mg Oral Daily  . insulin aspart  0-5 Units Subcutaneous QHS  . insulin aspart  0-9 Units Subcutaneous TID WC  . insulin aspart  6 Units Subcutaneous TID WC  . magnesium oxide  400 mg Oral BID  . phosphorus  250 mg Oral TID  . potassium chloride  20 mEq Oral Daily  . simvastatin  20 mg Oral q1800  . vitamin B-12  1,000 mcg Oral Daily   Continuous Infusions: . sodium chloride 250 mL (06/23/20 2132)  .  ceFAZolin (ANCEF) IV     Brief history admitted 06/21/2020 with severe sepsis.  E. coli growing out of blood and urine cultures.  Initially on Rocephin and now switched over to Ancef.  Can be discharged on high-dose Keflex upon leaving the hospital.  Patient also has a history of hypertension, chronic kidney disease stage IIIb, type 2 diabetes mellitus with hyperlipidemia.  Also replacing electrolytes during the hospital course. Assessment/Plan:  1. E. coli severe sepsis, present on admission.  Initially the patient had acute metabolic encephalopathy, leukocytosis and fever of 101.  Acute metabolic encephalopathy has improved.  Patient was initially on Rocephin and switched over to Ancef.  Likely switch over to Keflex upon disposition. 2. Essential hypertension.  Continue Norvasc 3. Chronic kidney disease stage IIIb.  Creatinine actually improved to 1.36 today.  Continue to monitor 4. Hyponatremia. 5. Type 2 diabetes mellitus with hyperlipidemia.  Continue Zocor.  Patient on sliding scale insulin and short acting insulin prior to  meals.  Likely can go back on oral meds upon disposition 6.  Severe hypomagnesemia on admission.  Patient's magnesium in the normal range today had received IV replacement the past 2 days.  Continue oral supplementation 7. Hypophosphatemia and hypokalemia.  Will give Neutra-Phos today and recheck levels tomorrow 8. Vitamin B12 deficiency.  I did give an IM B12 injection 2 days ago and oral B12 supplementation 9. Anxiety and depression continue fluoxetine and Wellbutrin 10. Weakness.  Physical therapy recommends rehab.  Patient and daughter would like to go home but only walked 2 steps yesterday.  Await physical therapy reevaluation. 11. Overnight oximetry.  Every day walking the patient is on 1 L of oxygen.  Able to come off oxygen during the day wondering if she is desatting at night.      Code Status:     Code Status Orders  (From admission, onward)         Start     Ordered   06/22/20 0105  Do not attempt resuscitation (DNR)  Continuous       Question Answer Comment  In the event of cardiac or respiratory ARREST Do not call a "code blue"   In the event of cardiac or respiratory ARREST Do not perform Intubation, CPR, defibrillation or ACLS   In the event of cardiac or respiratory ARREST Use medication by any route, position, wound care, and other measures to relive pain and suffering. May use oxygen, suction and manual treatment of airway obstruction as needed for comfort.   Comments CODE STATUS was discussed with patient and her daughter at the bedside and she is a DO NOT RESUSCITATE      06/22/20 0104        Code Status History    Date Active Date Inactive Code Status Order ID Comments User Context   11/23/2019 1420 11/30/2019 1909 DNR [id number]  Lucile ShuttersAgbata, Tochukwu, MD ED   Advance Care Planning Activity     Family Communication: Spoke with daughter at the bedside Disposition Plan: Status is: Inpatient  Dispo: The patient is from: Home              Anticipated d/c is to: Rehab  versus home health depending on PT progress              Patient currently doing better but still very weak.   Difficult to place patient.  Hopefully not  Antibiotics:  Ancef  Time spent: 28 minutes  [employer]  [facility]

## 2020-06-24 NOTE — Progress Notes (Signed)
Visited as Pt was recieving tray this afternoon. Daughter present and helping with feeding. Reports toleration of diet without coughing. ST assisted with positioning fully upright. No further ST needs. Please reconsult if we can further assist or if dysphagia is noted.

## 2020-06-24 NOTE — Progress Notes (Signed)
Physical Therapy Treatment Patient Details Name: Amanda Benson MRN: [id number] DOB: 07/29/1935 Today's Date: 06/24/2020    History of Present Illness Amanda Benson is an 84yoF who comes to [facility] on 3/19 c weakness, hyperglycemia, AMS- admitted for E. coli sepsis. PMH: DM2, HTN, HLD, CKD3.    PT Comments    Pt is making limited progress towards goals with ability to ambulate short distances, only required 1 person assist (varying levels between mod/max assist). Needs hands on assist due to unsteadiness. Fatigues quickly. Continue to recommend SNF, however patient adamant about going home. Family requesting [facility] for home use, messaged CSW.   Follow Up Recommendations  SNF     Equipment Recommendations  None recommended by PT    Recommendations for Other Services       Precautions / Restrictions Precautions Precautions: Fall Restrictions Weight Bearing Restrictions: No    Mobility  Bed Mobility Overal bed mobility: Needs Assistance Bed Mobility: Supine to Sit     Supine to sit: Mod assist     General bed mobility comments: safe technique, needs cues for sequencing    Transfers Overall transfer level: Needs assistance Equipment used: Rolling walker (2 wheeled) Transfers: Sit to/from Stand Sit to Stand: Mod assist         General transfer comment: heavy cues for pushing from seated surface. RW used once standing. Very unsteady. Once standing, pt with heavy post leaning and had uncontrolled descent back to bed. 2nd attempt with increased assist and assist for ant. translation. Forward flexed posture  Ambulation/Gait Ambulation/Gait assistance: Mod assist Gait Distance (Feet): 2 Feet Assistive device: Rolling walker (2 wheeled) Gait Pattern/deviations: Step-to pattern     General Gait Details: ambulated several steps over to [facility] and over to recliner. Very slow with shuffle gait pattern. Heavy cues for sequencing. manual assist for weight shifting.   Stairs              Wheelchair Mobility    Modified Rankin (Stroke Patients Only)       Balance Overall balance assessment: Needs assistance Sitting-balance support: Bilateral upper extremity supported;Feet supported Sitting balance-Leahy Scale: Fair     Standing balance support: Bilateral upper extremity supported;During functional activity Standing balance-Leahy Scale: Poor                              Cognition Arousal/Alertness: Awake/alert Behavior During Therapy: WFL for tasks assessed/performed Overall Cognitive Status: Within Functional Limits for tasks assessed                                        Exercises Other Exercises Other Exercises: seated ther-ex performed on B LE including LAQ, alt. marching and AP. All ther-ex performed x 10 reps with supervision Other Exercises: ambulated over to [facility]. Min/mod assist for pericare and maintaining balance.    General Comments        Pertinent Vitals/Pain Pain Assessment: No/denies pain    Home Living                      Prior Function            PT Goals (current goals can now be found in the care plan section) Acute Rehab PT Goals Patient Stated Goal: to return home PT Goal Formulation: With patient Time For Goal Achievement: 07/07/20 Potential to  Achieve Goals: Good Progress towards PT goals: Progressing toward goals    Frequency    Min 2X/week      PT Plan Current plan remains appropriate    Co-evaluation              AM-PAC PT "6 Clicks" Mobility   Outcome Measure  Help needed turning from your back to your side while in a flat bed without using bedrails?: A Little Help needed moving from lying on your back to sitting on the side of a flat bed without using bedrails?: A Lot Help needed moving to and from a bed to a chair (including a wheelchair)?: A Lot Help needed standing up from a chair using your arms (e.g., wheelchair or bedside chair)?: A  Lot Help needed to walk in hospital room?: Total Help needed climbing 3-5 steps with a railing? : Total 6 Click Score: 11    End of Session Equipment Utilized During Treatment: Gait belt Activity Tolerance: Patient tolerated treatment well Patient left: in chair;with chair alarm set;with family/visitor present Nurse Communication: Mobility status PT Visit Diagnosis: Muscle weakness (generalized) (M62.81);Difficulty in walking, not elsewhere classified (R26.2)     Time: 4193-7902 PT Time Calculation (min) (ACUTE ONLY): 42 min  Charges:  $Gait Training: 8-22 mins $Therapeutic Exercise: 8-22 mins $Therapeutic Activity: 8-22 mins                     Elizabeth Palau, PT, DPT [phone]    Amanda Benson 06/24/2020, 5:00 PM

## 2020-06-24 NOTE — Plan of Care (Signed)

## 2020-06-24 NOTE — Progress Notes (Signed)
Inpatient Diabetes Program Recommendations  AACE/ADA: New Consensus Statement on Inpatient Glycemic Control (2015)  Target Ranges:  Prepandial:   less than 140 mg/dL      Peak postprandial:   less than 180 mg/dL (1-2 hours)      Critically ill patients:  140 - 180 mg/dL   Results for Amanda Benson, Amanda Benson (MRN [id number]) as of 06/24/2020 12:32  Ref. Range 06/23/2020 07:29 06/23/2020 11:08 06/23/2020 16:40 06/23/2020 21:21  Glucose-Capillary Latest Ref Range: 70 - 99 mg/dL 433 (H) 295 (H) 188 (H) 250 (H)   Results for Amanda Benson, Amanda Benson (MRN [id number]) as of 06/24/2020 12:32  Ref. Range 06/24/2020 00:06 06/24/2020 03:36 06/24/2020 07:53 06/24/2020 12:04  Glucose-Capillary Latest Ref Range: 70 - 99 mg/dL 601 (H) 093 (H) 235 (H)  6 units NOVOLOG  349 (H)  10 units NOVOLOG     Home DM Meds: Glipizide 10 mg Daily                             Ozempic 0.25 mg Qweek  Current Orders: Novolog Sensitive Correction Scale/ SSI (0-9 units) TID AC +HS      Novolog 3 units TID with meals     MD- Please consider:  Increase Novolog Meal Coverage to 6 units TID with meals      --Will follow patient during hospitalization--  Ambrose Finland RN, MSN, CDE Diabetes Coordinator Inpatient Glycemic Control Team Team Pager: [phone] (8a-5p)

## 2020-06-25 LAB — GLUCOSE, CAPILLARY
Glucose-Capillary: 192 mg/dL — ABNORMAL HIGH (ref 70–99)
Glucose-Capillary: 285 mg/dL — ABNORMAL HIGH (ref 70–99)
Glucose-Capillary: 321 mg/dL — ABNORMAL HIGH (ref 70–99)
Glucose-Capillary: 252 mg/dL — ABNORMAL HIGH (ref 70–99)
Glucose-Capillary: 257 mg/dL — ABNORMAL HIGH (ref 70–99)
Glucose-Capillary: 258 mg/dL — ABNORMAL HIGH (ref 70–99)

## 2020-06-25 MED ORDER — PANTOPRAZOLE SODIUM 40 MG PO TBEC
40.0000 mg | DELAYED_RELEASE_TABLET | Freq: Every day | ORAL | Status: DC
  Administered 2020-06-25 – 2020-06-26 (×2): 40 mg via ORAL
  Filled 2020-06-25 (×2): qty 1

## 2020-06-25 MED ORDER — INSULIN GLARGINE 100 UNIT/ML ~~LOC~~ SOLN
10.0000 [IU] | Freq: Every day | SUBCUTANEOUS | Status: DC
  Administered 2020-06-25 – 2020-06-26 (×2): 10 [IU] via SUBCUTANEOUS
  Filled 2020-06-25 (×3): qty 0.1

## 2020-06-25 MED ORDER — FAMOTIDINE 20 MG PO TABS
20.0000 mg | ORAL_TABLET | Freq: Two times a day (BID) | ORAL | Status: DC
  Administered 2020-06-25 – 2020-06-26 (×3): 20 mg via ORAL
  Filled 2020-06-25 (×3): qty 1

## 2020-06-25 NOTE — Progress Notes (Signed)
Speech Language Pathology Treatment: Dysphagia  Patient Details Name: Amanda [facility] MRN: [id number] DOB: Mar 24, 1936 Today's Date: 06/25/2020 Time: 1410-1445 SLP Time Calculation (min) (ACUTE ONLY): 35 min  Assessment / Plan / Recommendation Clinical Impression  Pt seen for ongoing assessment of swallowing and toleration of diet(Mech Soft); education re: swallowing, Esophageal dysmotility and impact on swallowing; Reflux s/s. Further education was given on diet consistency rec'd and general aspiration and REFLUX precautions. Family(Dtr) member present.  Per a CT of ABD in 2021, it revealed pt has "Moderate-sized hiatal hernia with wall thickening in the hernia sac, question artifact from underdistention, as well as thickening of distal esophageal wall; consider endoscopy to exclude gastric neoplasm". This presentation could certainly impact Esophageal motility and increase dysmotility causing some/most of the s/s of Reflux pt c/o. It could also help explain the decreased oral intake and n/v feelings. Pt and Dtr stated pt has suffered from s/s of Reflux for "some time" and takes TUMS at home, especially at night. MD made aware.   Per ongoing assessment, pt appears to adequately tolerate po trials from an oropharyngeal phase standpoint, but pt is at risk for aspiration of RETROGRADE bolus activity and REFLUX behavior Backflowing from the Esophagus into the pharynx thus impacting Pulmonary status. This was discussed w/ pt and family member; handouts discussed and given to both on Reflux precautions; Esophageal motility strategies. Discussed the benefit of the Soft, even well-chopped foods in her diet for easier Esophageal clearing and for ease of oral phase management in light of pt's Dentition status baseline. Rec'd gravies to moisten foods; soups. Family and pt agreed. Pt consumed sips of thin liquids w/ no overt s/s of aspiration noted while in room. Noted she was drinking an Ensure; and had not eaten  much of her lunch meal. Discussed general aspiration precautions including need to sit fully upright w/ all oral intake; small bites/sips slowly(this will also help w/ Esophageal phase).  Recommend continue a Dysphagia level 3 (well-chopped meats, foods moistened well) diet w/ Thin liquids; aspiration precautions; REFLUX precautions; Pill Whole in Puree for easier clearing. Discussion w/ MD for PPI and f/u consult w/ GI for further education/management. NSG to offer tray setup as needed.  ST services will sign off at this time as pt appears at her Baseline w/ oropharyngeal phase swallowing; NSG to reconsult if new needs arise while admitted. MD updated and agreed. He will f/u w/ discussion w/ pt; PPI.    HPI HPI: Per admitting H&P"[name] is a 85 y.o. female with medical history significant of DM2, HTN,  HLD.  Presented with generalized weakness mild confusion.  She has been having some nausea decreased p.o. intake no recent falls 4 days ago started to have Lower abd pain family was concerned she may have a UTI though she just recently finished antibiotics family unsure what.  Family denies any fevers or chills no cough  At baseline wheelchair-bound.  Family did state that when she had one episode of vomiting there could have been something pink in there but no coffee ground. This was not confirmed.  Per chart, CT of ABD in 2021 revealed: Moderate-sized hiatal hernia with wall thickening in the hernia sac; as well as thickening of  distal esophageal wall; consider endoscopy to exclude gastric neoplasm".      SLP Plan  All goals met       Recommendations  Diet recommendations: Dysphagia 3 (mechanical soft);Thin liquid (for Esophageal motility ease; missing dentition) Liquids provided via: Cup;Straw Medication  Administration: Whole meds with puree (for ease of swallowing) Supervision: Patient able to self feed;Intermittent supervision to cue for compensatory strategies (as  needed) Compensations: Minimize environmental distractions;Slow rate;Small sips/bites;Lingual sweep for clearance of pocketing;Follow solids with liquid Postural Changes and/or Swallow Maneuvers: Seated upright 90 degrees;Out of bed for meals;Upright 30-60 min after meal (REFLUX precautions)                General recommendations:  (GI f/u; Dietician f/u) Oral Care Recommendations: Oral care BID;Oral care before and after PO;Staff/trained caregiver to provide oral care Follow up Recommendations: None SLP Visit Diagnosis: Dysphagia, unspecified (R13.10) (Esophageal phase deficits) Plan: All goals met       GO                Orinda Kenner, MS, CCC-SLP Speech Language Pathologist Rehab Services [phone] [facility] 06/25/2020, 4:39 PM

## 2020-06-25 NOTE — Plan of Care (Signed)

## 2020-06-25 NOTE — TOC Initial Note (Signed)
Transition of Care [facility]) - Initial/Assessment Note    Patient Details  Name: Amanda [facility] MRN: [id number] Date of Birth: 03-25-1936  Transition of [facility] CM/SW Contact:    Amanda Liner, LCSW Phone Number: 06/25/2020, 1:20 PM  Clinical Narrative:   CSW met with patient. Daughter at bedside. CSW introduced role and explained that PT recommendations would be discussed. Patient and daughter confirmed preference for returning home with home health rather than going to SNF. Patient had home health services through [facility] last year and would like to work with them again. Left message for [facility] representative. Will give referral when she calls back. Daughter asking about a [facility] lift but [facility] do not carry them. After internet search, it appears insurance does not pay for them either. Patient has walkers, wheelchair, and bedside commode at home. She does not want a [facility] bed or hoyer lift at this time. No further concerns. CSW encouraged patient and her daughter to contact CSW as needed. CSW will continue to follow patient and her daughter for support and facilitate return home when stable. Daughter will transport her home at discharge.              Expected Discharge Plan: Home w Home Health Services Barriers to Discharge: Continued Medical Work up   Patient Goals and CMS Choice        Expected Discharge Plan and Services Expected Discharge Plan: Home w Home Health Services     Post Acute Care Choice: Home Health Living arrangements for the past 2 months: Single Family Home                                      Prior Living Arrangements/Services Living arrangements for the past 2 months: Single Family Home Lives with:: Adult Children Patient language and need for interpreter reviewed:: Yes Do you feel safe going back to the place where you live?: Yes      Need for Family Participation in Patient Care: Yes  (Comment) Care giver support system in place?: Yes (comment) Current home services: DME Criminal Activity/Legal Involvement Pertinent to Current Situation/Hospitalization: No - Comment as needed  Activities of Daily Living      Permission Sought/Granted Permission sought to share information with : Facility Contact Representative,Family Supports Permission granted to share information with : Yes, Verbal Permission Granted  Share Information with NAME: [facility]  Permission granted to share info w AGENCY: [facility]  Permission granted to share info w Relationship: Daughter  Permission granted to share info w Contact Information: [phone]  Emotional Assessment Appearance:: Appears stated age Attitude/Demeanor/Rapport: Engaged,Gracious Affect (typically observed): Accepting,Appropriate,Calm,Pleasant Orientation: : Oriented to Self,Oriented to Place,Oriented to  Time,Oriented to Situation Alcohol / Substance Use: Not Applicable Psych Involvement: No (comment)  Admission diagnosis:  Acute cystitis with hematuria [N30.01] Sepsis (HCC) [A41.9] Sepsis without acute organ dysfunction, due to unspecified organism [facility]) [A41.9] Patient Active Problem List   Diagnosis Date Noted  . Hypophosphatemia   . Hypomagnesemia   . Hypokalemia   . B12 deficiency   . Sepsis (HCC) 06/21/2020  . Acute lower UTI 06/21/2020  . Acute metabolic encephalopathy 06/21/2020  . Hyponatremia 06/21/2020  . H/O sepsis 12/09/2019  . CKD (chronic kidney disease) 11/23/2019  . Leukocytosis 11/23/2019  . Mobility impaired 10/13/2019  . Incontinence of urine 10/12/2019  . Grief reaction 03/27/2019  . Elevated  serum creatinine 06/13/2018  . Dysuria 06/13/2018  . Hearing loss 01/02/2018  . Subclinical hypothyroidism 10/26/2017  . Anxiety and depression 08/24/2016  . Falls frequently 08/24/2016  . Weakness 08/24/2016  . Routine general medical examination at a health care facility 05/16/2015  . Breast  calcifications on mammogram 03/01/2014  . Encounter for Medicare annual wellness exam 05/30/2013  . Breast calcification seen on mammogram 12/14/2012  . Breast calcification, left 11/28/2012  . History of falling 11/27/2012  . Poor balance 11/27/2012  . Colon cancer screening 05/10/2012  . Osteoporosis 06/21/2007  . DM (diabetes mellitus), type 2, uncontrolled ([place]) 09/20/2006  . Type 2 diabetes mellitus with hyperlipidemia ([place]) 09/20/2006  . Essential hypertension 09/20/2006  . Osteoarthritis 09/20/2006   PCP:  Amanda Greenspan, MD Pharmacy:   [facility] - [place], [place] - [address] [place] [address] Phone: [phone] Fax: [phone]     Social Determinants of Health (SDOH) Interventions    Readmission Risk Interventions No flowsheet data found.

## 2020-06-25 NOTE — Progress Notes (Signed)
PROGRESS NOTE    Amanda Benson  ZOX:[id number] DOB: 03/22/1936 DOA: 06/21/2020 PCP: Judy Pimpleower, Marne A, MD    Brief Narrative:  Amanda Benson is a 85 y.o. female with medical history significant of DM2, HTN,  HLD    Presented with   generalized weakness mild confusion.  She has been having some nausea decreased p.o. intake no recent falls 4 days ago started to have Lower abd pain family was concerned she may have a UTI though she just recently finished antibiotics family unsure what.  Admission diagnosis consistent with severe sepsis.  E. coli in blood and urine.  Initially on Rocephin now switched to Ancef.  Has been significantly weak and deconditioned.  Therapy evaluations recommend skilled nursing facility however patient and daughter both wish for her to go back home.   Assessment & Plan:   Active Problems:   DM (diabetes mellitus), type 2, uncontrolled (HCC)   Type 2 diabetes mellitus with hyperlipidemia (HCC)   Essential hypertension   Anxiety and depression   Falls frequently   Weakness   CKD (chronic kidney disease)   Sepsis (HCC)   Acute lower UTI   Acute metabolic encephalopathy   Hyponatremia   Hypomagnesemia   Hypokalemia   B12 deficiency   Hypophosphatemia  1. E. coli severe sepsis, present on admission.  Initially the patient had acute metabolic encephalopathy, leukocytosis and fever of 101.  Acute metabolic encephalopathy has improved.  Patient was initially on Rocephin and switched over to Ancef.  Likely switch over to Keflex upon disposition.  Anticipate discharge home with home health in 24 hours 2. Essential hypertension.  Continue Norvasc 3. Chronic kidney disease stage IIIb.    Creatinine improving over interval.  Continue to monitor.   4. Hyponatremia.  Improved 5. Type 2 diabetes mellitus with hyperlipidemia.  Continue Zocor.  Patient on sliding scale insulin and short acting insulin prior to meals.  Likely can go back on oral meds upon  disposition 6. Severe hypomagnesemia on admission.  Magnesium back in reference range.  Continue oral supplementation.  Likely dietary related  7. hypophosphatemia and hypokalemia.    Check daily and replace as necessary  8. Vitamin B12 deficiency.    Status post IM B12 injection and daily B12 supplementation p.o.  9. anxiety and depression continue fluoxetine and Wellbutrin 10. Weakness.  Physical therapy recommends rehab.    Physical therapy evaluated patient and continue their recommendation for skilled nursing facility however patient and daughter both wish to go back home.  Anticipate discharge home with home health on 3/24  11. overnight oximetry.  Every day walking the patient is on 1 L of oxygen.  Able to come off oxygen during the day wondering if she is desatting at night.  Likely would benefit from outpatient sleep study    DVT prophylaxis: SCD Code Status: DNR Family Communication: Family member at bedside 3/23  disposition Plan: Status is: Inpatient  Remains inpatient appropriate because:Inpatient level of care appropriate due to severity of illness   Dispo: The patient is from: Home              Anticipated d/c is to: Home              Patient currently is not medically stable to d/c.   Difficult to place patient No  Anticipate discharge home with home health 3/24     Level of care: Med-Surg  Consultants:   None  Procedures:  None  Antimicrobials:   Ancef  Subjective: Seen and examined.  Daughter at bedside.  Continues to endorse weakness but improving over interval.  Anticipated date of discharge 3/24  Objective: Vitals:   06/25/20 0607 06/25/20 0731 06/25/20 1142 06/25/20 1504  BP: (!) 150/59 (!) 148/41 (!) 144/61 (!) 129/37  Pulse: 76 76 72 70  Resp: 18 15 14 15   Temp: 99.3 F (37.4 C) 98.8 F (37.1 C) 98.9 F (37.2 C) 99.6 F (37.6 C)  TempSrc:      SpO2: 95% 94% 95% 94%  Weight:      Height:        Intake/Output Summary (Last 24  hours) at 06/25/2020 1632 Last data filed at 06/25/2020 1411 Gross per 24 hour  Intake 120 ml  Output 650 ml  Net -530 ml   Filed Weights   06/21/20 2011  Weight: 60 kg    Examination:  General exam: No acute distress.  Appears frail Respiratory system: Poor respiratory effort.  Lungs clear.  0.5 L Cardiovascular system: S1 & S2 heard, RRR. No JVD, murmurs, rubs, gallops or clicks. No pedal edema. Gastrointestinal system: Abdomen is nondistended, soft and nontender. No organomegaly or masses felt. Normal bowel sounds heard. Central nervous system: Alert and oriented. No focal neurological deficits. Extremities: Symmetric 5 x 5 power. Skin: No rashes, lesions or ulcers Psychiatry: Judgement and insight appear normal. Mood & affect appropriate.     Data Reviewed: I have personally reviewed following labs and imaging studies  CBC: Recent Labs  Lab 06/21/20 2020 06/22/20 0201  WBC 15.1* 12.5*  NEUTROABS 12.5* 10.8*  HGB 11.4* 10.9*  HCT 35.9* 33.3*  MCV 88.2 87.2  PLT 338 307   Basic Metabolic Panel: Recent Labs  Lab 06/21/20 2020 06/22/20 0201 06/23/20 1219 06/24/20 0727  NA 131* 134* 130* 132*  K 3.7 3.2* 3.4* 3.8  CL 97* 100 96* 98  CO2 26 27 25 26   GLUCOSE 215* 197* 328* 204*  BUN 36* 34* 32* 25*  CREATININE 1.51* 1.53* 1.51* 1.36*  CALCIUM 10.7* 10.1 9.1 8.7*  MG 1.1* 0.9* 1.8 2.1  PHOS 2.8 2.4* 1.8* 2.3*   GFR: Estimated Creatinine Clearance: 30.2 mL/min (A) (by C-G formula based on SCr of 1.36 mg/dL (H)). Liver Function Tests: Recent Labs  Lab 06/21/20 2020 06/22/20 0201  AST 27 24  ALT 20 19  ALKPHOS 60 52  BILITOT 0.9 0.5  PROT 6.4* 6.1*  ALBUMIN 2.7* 2.5*   No results for input(s): LIPASE, AMYLASE in the last 168 hours. Recent Labs  Lab 06/21/20 2021  AMMONIA 20   Coagulation Profile: Recent Labs  Lab 06/22/20 0201  INR 1.1   Cardiac Enzymes: Recent Labs  Lab 06/21/20 2020  CKTOTAL 23*   BNP (last 3 results) No results for  input(s): PROBNP in the last 8760 hours. HbA1C: No results for input(s): HGBA1C in the last 72 hours. CBG: Recent Labs  Lab 06/24/20 2145 06/25/20 0734 06/25/20 1146 06/25/20 1507 06/25/20 1610  GLUCAP 192* 285* 321* 252* 257*   Lipid Profile: No results for input(s): CHOL, HDL, LDLCALC, TRIG, CHOLHDL, LDLDIRECT in the last 72 hours. Thyroid Function Tests: No results for input(s): TSH, T4TOTAL, FREET4, T3FREE, THYROIDAB in the last 72 hours. Anemia Panel: No results for input(s): VITAMINB12, FOLATE, FERRITIN, TIBC, IRON, RETICCTPCT in the last 72 hours. Sepsis Labs: Recent Labs  Lab 06/21/20 2020 06/21/20 2021 06/22/20 0201  PROCALCITON 0.17  --   --   LATICACIDVEN  --  1.2 1.4    Recent Results (  from the past 240 hour(s))  Blood culture (routine single)     Status: Abnormal   Collection Time: 06/21/20  8:20 PM   Specimen: BLOOD  Result Value Ref Range Status   Specimen Description   Final    BLOOD LEFT ANTECUBITAL Performed at [facility], [address], [place], [place] [address]    Special Requests   Final    BOTTLES DRAWN AEROBIC AND ANAEROBIC Blood Culture results may not be optimal due to an inadequate volume of blood received in culture bottles Performed at [facility] [address] [place], [place] [address]    Culture  Setup Time   Final    PEDIATRICS AEROBIC BOTTLE ONLY GRAM NEGATIVE RODS CRITICAL RESULT CALLED TO, READ BACK BY AND VERIFIED WITH: ALEX CHAPPELL 06/22/20 AT 0849 BY ACR Performed at [facility], [address], [place], [place] [address]    Culture ESCHERICHIA COLI (A)  Final   Report Status 06/24/2020 FINAL  Final   Organism ID, Bacteria ESCHERICHIA COLI  Final      Susceptibility   Escherichia coli - MIC*    AMPICILLIN >=32 RESISTANT Resistant     CEFAZOLIN <=4 SENSITIVE Sensitive     CEFEPIME <=0.12 SENSITIVE Sensitive     CEFTAZIDIME <=1 SENSITIVE Sensitive     CEFTRIAXONE <=0.25 SENSITIVE Sensitive      CIPROFLOXACIN <=0.25 SENSITIVE Sensitive     GENTAMICIN <=1 SENSITIVE Sensitive     IMIPENEM <=0.25 SENSITIVE Sensitive     TRIMETH/SULFA <=20 SENSITIVE Sensitive     AMPICILLIN/SULBACTAM >=32 RESISTANT Resistant     PIP/TAZO <=4 SENSITIVE Sensitive     * ESCHERICHIA COLI  Blood Culture ID Panel (Reflexed)     Status: Abnormal   Collection Time: 06/21/20  8:20 PM  Result Value Ref Range Status   Enterococcus faecalis NOT DETECTED NOT DETECTED Final   Enterococcus Faecium NOT DETECTED NOT DETECTED Final   Listeria monocytogenes NOT DETECTED NOT DETECTED Final   Staphylococcus species NOT DETECTED NOT DETECTED Final   Staphylococcus aureus (BCID) NOT DETECTED NOT DETECTED Final   Staphylococcus epidermidis NOT DETECTED NOT DETECTED Final   Staphylococcus lugdunensis NOT DETECTED NOT DETECTED Final   Streptococcus species NOT DETECTED NOT DETECTED Final   Streptococcus agalactiae NOT DETECTED NOT DETECTED Final   Streptococcus pneumoniae NOT DETECTED NOT DETECTED Final   Streptococcus pyogenes NOT DETECTED NOT DETECTED Final   A.calcoaceticus-baumannii NOT DETECTED NOT DETECTED Final   Bacteroides fragilis NOT DETECTED NOT DETECTED Final   Enterobacterales DETECTED (A) NOT DETECTED Final    Comment: Enterobacterales represent a large order of gram negative bacteria, not a single organism. CRITICAL RESULT CALLED TO, READ BACK BY AND VERIFIED WITH: ALEX CHAPPELL 06/22/20 AT 0849 BY ACR    Enterobacter cloacae complex NOT DETECTED NOT DETECTED Final   Escherichia coli DETECTED (A) NOT DETECTED Final    Comment: CRITICAL RESULT CALLED TO, READ BACK BY AND VERIFIED WITH: ALEX CHAPPELL 06/22/20 AT 0849 BY ACR    Klebsiella aerogenes NOT DETECTED NOT DETECTED Final   Klebsiella oxytoca NOT DETECTED NOT DETECTED Final   Klebsiella pneumoniae NOT DETECTED NOT DETECTED Final   Proteus species NOT DETECTED NOT DETECTED Final   Salmonella species NOT DETECTED NOT DETECTED Final   Serratia  marcescens NOT DETECTED NOT DETECTED Final   Haemophilus influenzae NOT DETECTED NOT DETECTED Final   Neisseria meningitidis NOT DETECTED NOT DETECTED Final   Pseudomonas aeruginosa NOT DETECTED NOT DETECTED Final   Stenotrophomonas  maltophilia NOT DETECTED NOT DETECTED Final   Candida albicans NOT DETECTED NOT DETECTED Final   Candida auris NOT DETECTED NOT DETECTED Final   Candida glabrata NOT DETECTED NOT DETECTED Final   Candida krusei NOT DETECTED NOT DETECTED Final   Candida parapsilosis NOT DETECTED NOT DETECTED Final   Candida tropicalis NOT DETECTED NOT DETECTED Final   Cryptococcus neoformans/gattii NOT DETECTED NOT DETECTED Final   CTX-M ESBL NOT DETECTED NOT DETECTED Final   Carbapenem resistance IMP NOT DETECTED NOT DETECTED Final   Carbapenem resistance KPC NOT DETECTED NOT DETECTED Final   Carbapenem resistance NDM NOT DETECTED NOT DETECTED Final   Carbapenem resist OXA 48 LIKE NOT DETECTED NOT DETECTED Final   Carbapenem resistance VIM NOT DETECTED NOT DETECTED Final    Comment: Performed at [facility], [address] [place], [place] [address]  Resp Panel by RT-PCR (Flu A&B, Covid) Urine, Catheterized     Status: None   Collection Time: 06/21/20  8:21 PM   Specimen: Urine, Catheterized; Nasopharyngeal(NP) swabs in vial transport medium  Result Value Ref Range Status   SARS Coronavirus 2 by RT PCR NEGATIVE NEGATIVE Final    Comment: (NOTE) SARS-CoV-2 target nucleic acids are NOT DETECTED.  The SARS-CoV-2 RNA is generally detectable in upper respiratory specimens during the acute phase of infection. The lowest concentration of SARS-CoV-2 viral copies this assay can detect is 138 copies/mL. A negative result does not preclude SARS-Cov-2 infection and should not be used as the sole basis for treatment or other patient management decisions. A negative result may occur with  improper specimen collection/handling, submission of specimen other than  nasopharyngeal swab, presence of viral mutation(s) within the areas targeted by this assay, and inadequate number of viral copies(<138 copies/mL). A negative result must be combined with clinical observations, patient history, and epidemiological information. The expected result is Negative.  Fact Sheet for Patients:  BloggerCourse.com  Fact Sheet for Healthcare Providers:  SeriousBroker.it  This test is no t yet approved or cleared by the [place] FDA and  has been authorized for detection and/or diagnosis of SARS-CoV-2 by FDA under an Emergency Use Authorization (EUA). This EUA will remain  in effect (meaning this test can be used) for the duration of the COVID-19 declaration under Section 564(b)(1) of the Act, 21 U.S.C.section 360bbb-3(b)(1), unless the authorization is terminated  or revoked sooner.       Influenza A by PCR NEGATIVE NEGATIVE Final   Influenza B by PCR NEGATIVE NEGATIVE Final    Comment: (NOTE) The Xpert Xpress SARS-CoV-2/FLU/RSV plus assay is intended as an aid in the diagnosis of influenza from Nasopharyngeal swab specimens and should not be used as a sole basis for treatment. Nasal washings and aspirates are unacceptable for Xpert Xpress SARS-CoV-2/FLU/RSV testing.  Fact Sheet for Patients: BloggerCourse.com  Fact Sheet for Healthcare Providers: SeriousBroker.it  This test is not yet approved or cleared by the [place] FDA and has been authorized for detection and/or diagnosis of SARS-CoV-2 by FDA under an Emergency Use Authorization (EUA). This EUA will remain in effect (meaning this test can be used) for the duration of the COVID-19 declaration under Section 564(b)(1) of the Act, 21 U.S.C. section 360bbb-3(b)(1), unless the authorization is terminated or revoked.  Performed at [facility], [address], [place], [place]  [address]   Urine culture     Status: Abnormal   Collection Time: 06/21/20  8:21 PM   Specimen: In/Out Cath Urine  Result Value Ref Range  Status   Specimen Description   Final    IN/OUT CATH URINE Performed at [facility], [address] [place], [place] [address]    Special Requests   Final    NONE Performed at [facility], [address] [place], [place] [address]    Culture >=100,000 COLONIES/mL ESCHERICHIA COLI (A)  Final   Report Status 06/24/2020 FINAL  Final   Organism ID, Bacteria ESCHERICHIA COLI (A)  Final      Susceptibility   Escherichia coli - MIC*    AMPICILLIN >=32 RESISTANT Resistant     CEFAZOLIN <=4 SENSITIVE Sensitive     CEFEPIME <=0.12 SENSITIVE Sensitive     CEFTRIAXONE <=0.25 SENSITIVE Sensitive     CIPROFLOXACIN <=0.25 SENSITIVE Sensitive     GENTAMICIN <=1 SENSITIVE Sensitive     IMIPENEM <=0.25 SENSITIVE Sensitive     NITROFURANTOIN <=16 SENSITIVE Sensitive     TRIMETH/SULFA <=20 SENSITIVE Sensitive     AMPICILLIN/SULBACTAM >=32 RESISTANT Resistant     PIP/TAZO <=4 SENSITIVE Sensitive     * >=100,000 COLONIES/mL ESCHERICHIA COLI         Radiology Studies: US RENAL  Result Date: 06/24/2020 CLINICAL DATA:  Sepsis. EXAM: RENAL / URINARY TRACT ULTRASOUND COMPLETE COMPARISON:  Ultrasound 11/23/2019. FINDINGS: Right Kidney: Renal measurements: 11.4 x 4.6 x 4.8 cm = volume: 132.8 mL. Echogenicity within normal limits. No mass or hydronephrosis visualized. Left Kidney: Renal measurements: 9.9 x 5.4 x 4.1 cm = volume: 113.5 mL. Echogenicity within normal limits. No mass or hydronephrosis visualized. Mild volume loss in the left lower kidney again noted. Findings most consistent scarring. Bladder: Appears normal for degree of bladder distention. Other: Bilateral pleural effusions incidentally noted. IMPRESSION: 1. No acute renal abnormality identified. No hydronephrosis or bladder distention. Mild volume loss left lower renal pole again  noted. Findings most consistent with scarring. Similar findings noted on prior study. 2. Bilateral pleural effusions incidentally noted. Electronically Signed   By: Maisie Fus  Register   On: 06/24/2020 06:41        Scheduled Meds: . amLODipine  5 mg Oral Daily  . buPROPion  150 mg Oral Daily  . famotidine  20 mg Oral BID  . feeding supplement  237 mL Oral BID BM  . FLUoxetine  40 mg Oral Daily  . insulin aspart  0-5 Units Subcutaneous QHS  . insulin aspart  0-9 Units Subcutaneous TID WC  . insulin aspart  6 Units Subcutaneous TID WC  . insulin glargine  10 Units Subcutaneous Daily  . magnesium oxide  400 mg Oral BID  . pantoprazole  40 mg Oral Daily  . potassium chloride  20 mEq Oral Daily  . simvastatin  20 mg Oral q1800  . vitamin B-12  1,000 mcg Oral Daily   Continuous Infusions: . sodium chloride 10 mL/hr at 06/24/20 2125  .  ceFAZolin (ANCEF) IV 2 g (06/25/20 1239)     LOS: 3 days    Time spent: 15 minutes    Tresa Moore, MD [facility] Pager [phone]  If 7PM-7AM, please contact night-coverage 06/25/2020, 4:32 PM

## 2020-06-25 NOTE — Progress Notes (Signed)
Inpatient Diabetes Program Recommendations  AACE/ADA: New Consensus Statement on Inpatient Glycemic Control (2015)  Target Ranges:  Prepandial:   less than 140 mg/dL      Peak postprandial:   less than 180 mg/dL (1-2 hours)      Critically ill patients:  140 - 180 mg/dL   Lab Results  Component Value Date   GLUCAP 285 (H) 06/25/2020   HGBA1C 8.0 (H) 06/21/2020    Review of Glycemic Control Results for Amanda Benson, Amanda Benson (MRN [id number]) as of 06/25/2020 10:01  Ref. Range 06/24/2020 12:04 06/24/2020 15:47 06/24/2020 19:31 06/24/2020 21:45 06/25/2020 07:34  Glucose-Capillary Latest Ref Range: 70 - 99 mg/dL 846 (H) 962 (H) 952 (H) 192 (H) 285 (H)   Diabetes history: DM2 Outpatient Diabetes medications:  Glipizide 10 mg Daily Ozempic 0.25 mg Qweek Current orders for Inpatient glycemic control:  Novolog Sensitive Correction Scale/ SSI (0-9 units)TID AC +HS Novolog 6 units TID with meals  Inpatient Diabetes Program Recommendations:     Please consider adding Lantus 10 units daily while inpatient.  Will continue to follow while inpatient.  Thank you, Dulce Sellar, RN, BSN Diabetes Coordinator Inpatient Diabetes Program [phone] (team pager from 8a-5p)

## 2020-06-26 LAB — GLUCOSE, CAPILLARY
Glucose-Capillary: 170 mg/dL — ABNORMAL HIGH (ref 70–99)
Glucose-Capillary: 170 mg/dL — ABNORMAL HIGH (ref 70–99)
Glucose-Capillary: 170 mg/dL — ABNORMAL HIGH (ref 70–99)
Glucose-Capillary: 170 mg/dL — ABNORMAL HIGH (ref 70–99)
Glucose-Capillary: 290 mg/dL — ABNORMAL HIGH (ref 70–99)
Glucose-Capillary: 204 mg/dL — ABNORMAL HIGH (ref 70–99)

## 2020-06-26 MED ORDER — PANTOPRAZOLE SODIUM 40 MG PO TBEC: 40.0000 mg | DELAYED_RELEASE_TABLET | Freq: Every day | ORAL | 0 refills | Status: DC

## 2020-06-26 MED ORDER — CEFPODOXIME PROXETIL 200 MG PO TABS: 200.0000 mg | ORAL_TABLET | Freq: Two times a day (BID) | ORAL | 0 refills | Status: AC

## 2020-06-26 MED ORDER — GLUCERNA SHAKE PO LIQD
237.0000 mL | Freq: Three times a day (TID) | ORAL | Status: DC
  Administered 2020-06-26 (×2): 237 mL via ORAL

## 2020-06-26 NOTE — Discharge Summary (Signed)
Physician Discharge Summary  Amanda Benson PZW:[id number] DOB: June 06, 1935 DOA: 06/21/2020  PCP: Judy Pimple, MD  Admit date: 06/21/2020 Discharge date: 06/26/2020  Admitted From: Home Disposition: Home with home health  Recommendations for Outpatient Follow-up:  1. Follow up with PCP in 1-2 weeks 2.   Home Health: Yes Equipment/Devices: No Discharge Condition: Stable CODE STATUS: DNR Diet recommendation: Regular/dysphagia Brief/Interim Summary:  Amanda Chapdelaine Sidesis a 85 y.o.femalewith medical history significant of DM2, HTN, HLD  Presented withgeneralized weakness mild confusion.She has been having some nausea decreased p.o. intake no recent falls4 days ago started to have Lower abd pain family was concerned she may have a UTI though she just recently finished antibioticsfamily unsure what.  Admission diagnosis consistent with severe sepsis.  E. coli in blood and urine.  Initially on Rocephin now switched to Ancef.  Has been significantly weak and deconditioned.  Therapy evaluations recommend skilled nursing facility however patient and daughter both wish for her to go back home.  Long conversation on the day of discharge with the patient and the daughter at bedside.  Recommendation is for skilled nursing facility however they like to take patient back home with home health.  Full gamut of home health services has been ordered including PT, OT, RN, aide.  Will complete course of treatment for gram-negative sepsis.  Patient stable for discharge home with home health at this time.  Discharge Diagnoses:  Active Problems:   DM (diabetes mellitus), type 2, uncontrolled (HCC)   Type 2 diabetes mellitus with hyperlipidemia (HCC)   Essential hypertension   Anxiety and depression   Falls frequently   Weakness   CKD (chronic kidney disease)   Sepsis (HCC)   Acute lower UTI   Acute metabolic encephalopathy   Hyponatremia   Hypomagnesemia   Hypokalemia   B12 deficiency    Hypophosphatemia  1. E. coli severe sepsis, present on admission. Initially the patient had acute metabolic encephalopathy, leukocytosis and fever of 101. Acute metabolic encephalopathy has improved. Patient was initially on Rocephin and switched over to Ancef. Switch to Cefpodoxime at time of discharge.  200 mg twice daily x7 days. 2. Essential hypertension. Continue home regimen of Norvasc/lisinopril/adequate thiazide 3. Chronic kidney disease stage IIIb.     Creatinine approaching baseline at time of discharge  4. hyponatremia.  Improved 5. Type 2 diabetes mellitus with hyperlipidemia. Continue Zocor. Patient can resume home diabetic regimen at time of discharge  6. severe hypomagnesemia on admission.  Magnesium back in reference range.  Continue oral supplementation.  Likely dietary related.  Defer pharmacologic magnesium replacement at time of discharge.  Encourage adequate dietary intake 7. hypophosphatemia and hypokalemia.     Likely due to dietary deficiencies.   8. Vitamin B12 deficiency.   Status post IM B12 injection and daily B12 supplementation p.o.  9. anxiety and depression continue fluoxetine and Wellbutrin 10. Weakness. Physical therapy recommends rehab.   Physical therapy evaluated patient and continue their recommendation for skilled nursing facility however patient and daughter both wish to go back home.  Home health services ordered and patient discharged home     Discharge Instructions  Discharge Instructions    Diet - low sodium heart healthy   Complete by: As directed    Increase activity slowly   Complete by: As directed      Allergies as of 06/26/2020      Reactions   Alendronate Sodium    REACTION: heartburn      Medication List  STOP taking these medications   cephALEXin 500 MG capsule Commonly known as: KEFLEX   hydrochlorothiazide 12.5 MG tablet Commonly known as: HYDRODIURIL   losartan 50 MG tablet Commonly known as: COZAAR      TAKE these medications   amLODipine 10 MG tablet Commonly known as: NORVASC TAKE 1 TABLET(10 MG) BY MOUTH DAILY   aspirin 81 MG EC tablet Take 81 mg by mouth daily.   buPROPion 300 MG 24 hr tablet Commonly known as: WELLBUTRIN XL TAKE 1 TABLET(300 MG) BY MOUTH DAILY   cefpodoxime 200 MG tablet Commonly known as: VANTIN Take 1 tablet (200 mg total) by mouth 2 (two) times daily for 7 days.   cholecalciferol 25 MCG (1000 UNIT) tablet Commonly known as: VITAMIN D3 Take 1,000 Units by mouth daily.   FLUoxetine 40 MG capsule Commonly known as: PROZAC TAKE 1 CAPSULE(40 MG) BY MOUTH DAILY   glipiZIDE 10 MG 24 hr tablet Commonly known as: GLUCOTROL XL TAKE 1 TABLET(10 MG) BY MOUTH DAILY   Insulin Pen Needle 29G X Misc Use with insulin daily   losartan-hydrochlorothiazide 100-25 MG tablet Commonly known as: HYZAAR Take 0.5 tablets by mouth daily.   metoprolol succinate 25 MG 24 hr tablet Commonly known as: TOPROL-XL TAKE 1 TABLET(25 MG) BY MOUTH DAILY   Ozempic (0.25 or 0.5 MG/DOSE) 2 MG/1.5ML Sopn Generic drug: Semaglutide(0.25 or 0.5MG /DOS) Inject 0.25 mg into the skin once a week.   pantoprazole 40 MG tablet Commonly known as: PROTONIX Take 1 tablet (40 mg total) by mouth daily.   raloxifene 60 MG tablet Commonly known as: EVISTA TAKE 1 TABLET(60 MG) BY MOUTH DAILY   simvastatin 20 MG tablet Commonly known as: ZOCOR TAKE 1 TABLET(20 MG) BY MOUTH DAILY       Follow-up Information    [facility] Follow [facility].   Specialty: Home Health Services Why: They will follow up with you for your home health needs. Contact information: [address] [place]  [phone] [phone]              Allergies  Allergen Reactions  . Alendronate Sodium     REACTION: heartburn    Consultations:  None   Procedures/Studies: DG Chest 2 View  Result Date: 06/21/2020 CLINICAL DATA:  85 year old female with fever and weakness EXAM: CHEST - 2  VIEW COMPARISON:  Chest radiograph dated 11/26/2019. FINDINGS: No focal consolidation, pleural effusion or pneumothorax. Mild cardiomegaly. Atherosclerotic calcification of the aorta. No acute osseous pathology. IMPRESSION: No active cardiopulmonary disease. Electronically Signed   By: Elgie Collard M.D.   On: 06/21/2020 21:24   DG Abd 1 View  Result Date: 06/21/2020 CLINICAL DATA:  Sepsis EXAM: ABDOMEN - 1 VIEW COMPARISON:  CT 11/23/2019 FINDINGS: Prior cholecystectomy. Mildly prominent small bowel loops in the lower abdomen. Gas and stool seen throughout the colon. No organomegaly or free air. No suspicious calcification. Visualized lung bases clear. IMPRESSION: Mildly prominent small bowel loops in the lower abdomen. This could reflect mild ileus or early small bowel obstruction. Electronically Signed   By: Charlett Nose M.D.   On: 06/21/2020 22:47   CT Head Wo Contrast  Result Date: 06/21/2020 CLINICAL DATA:  Mental status change. Unknown cause. Hyperglycemia and weakness. EXAM: CT HEAD WITHOUT CONTRAST TECHNIQUE: Contiguous axial images were obtained from the base of the skull through the vertex without intravenous contrast. COMPARISON:  None. FINDINGS: Brain: No evidence of large-territorial acute infarction. No parenchymal hemorrhage. No mass lesion. No extra-axial  collection. No mass effect or midline shift. No hydrocephalus. Basilar cisterns are patent. Vascular: No hyperdense vessel. Atherosclerotic calcifications are present within the cavernous internal carotid arteries. Skull: No acute fracture or focal lesion. Sinuses/Orbits: Paranasal sinuses and mastoid air cells are clear. The orbits are unremarkable. Other: None. IMPRESSION: No acute intracranial abnormality. Electronically Signed   By: Tish Frederickson M.D.   On: 06/21/2020 20:54   US RENAL  Result Date: 06/24/2020 CLINICAL DATA:  Sepsis. EXAM: RENAL / URINARY TRACT ULTRASOUND COMPLETE COMPARISON:  Ultrasound 11/23/2019. FINDINGS:  Right Kidney: Renal measurements: 11.4 x 4.6 x 4.8 cm = volume: 132.8 mL. Echogenicity within normal limits. No mass or hydronephrosis visualized. Left Kidney: Renal measurements: 9.9 x 5.4 x 4.1 cm = volume: 113.5 mL. Echogenicity within normal limits. No mass or hydronephrosis visualized. Mild volume loss in the left lower kidney again noted. Findings most consistent scarring. Bladder: Appears normal for degree of bladder distention. Other: Bilateral pleural effusions incidentally noted. IMPRESSION: 1. No acute renal abnormality identified. No hydronephrosis or bladder distention. Mild volume loss left lower renal pole again noted. Findings most consistent with scarring. Similar findings noted on prior study. 2. Bilateral pleural effusions incidentally noted. Electronically Signed   By: Maisie Fus  Register   On: 06/24/2020 06:41    (Echo, Carotid, EGD, Colonoscopy, ERCP)    Subjective: Patient seen and examined on the day of discharge.  Stable, no distress.  Stable for discharge home at this time.  Daughter at bedside.  Post discharge plan of care explained at length.  All questions answered.  Discharge Exam: Vitals:   06/26/20 0832 06/26/20 1132  BP: (!) 165/65 (!) 138/50  Pulse: 78 70  Resp:  18  Temp: 99 F (37.2 C) 98.2 F (36.8 C)  SpO2: 91% 90%   Vitals:   06/25/20 1945 06/26/20 0533 06/26/20 0832 06/26/20 1132  BP: (!) 150/57 (!) 173/53 (!) 165/65 (!) 138/50  Pulse: 70 79 78 70  Resp: 17 16  18   Temp: 99.3 F (37.4 C) 97.9 F (36.6 C) 99 F (37.2 C) 98.2 F (36.8 C)  TempSrc: Oral Oral    SpO2: 94% 90% 91% 90%  Weight:      Height:        General: No acute distress.  Appears frail Cardiovascular: RRR, S1/S2 +, no rubs, no gallops Respiratory: CTA bilaterally, no wheezing, no rhonchi Abdominal: Soft, NT, ND, bowel sounds + Extremities: no edema, no cyanosis    The results of significant diagnostics from this hospitalization (including imaging, microbiology, ancillary  and laboratory) are listed below for reference.     Microbiology: Recent Results (from the past 240 hour(s))  Blood culture (routine single)     Status: Abnormal   Collection Time: 06/21/20  8:20 PM   Specimen: BLOOD  Result Value Ref Range Status   Specimen Description   Final    BLOOD LEFT ANTECUBITAL Performed at [facility], [address] [place], [place] [address]    Special Requests   Final    BOTTLES DRAWN AEROBIC AND ANAEROBIC Blood Culture results may not be optimal due to an inadequate volume of blood received in culture bottles Performed at [facility], [address], [place], [place] [address]    Culture  Setup Time   Final    PEDIATRICS AEROBIC BOTTLE ONLY GRAM NEGATIVE RODS CRITICAL RESULT CALLED TO, READ BACK BY AND VERIFIED WITH: ALEX CHAPPELL 06/22/20 AT 0849 BY ACR Performed at [facility], [address], [place], [place] [address]    Culture ESCHERICHIA COLI (A)  Final   Report Status 06/24/2020 FINAL  Final   Organism ID, Bacteria ESCHERICHIA COLI  Final      Susceptibility   Escherichia coli - MIC*    AMPICILLIN >=32 RESISTANT Resistant     CEFAZOLIN <=4 SENSITIVE Sensitive     CEFEPIME <=0.12 SENSITIVE Sensitive     CEFTAZIDIME <=1 SENSITIVE Sensitive     CEFTRIAXONE <=0.25 SENSITIVE Sensitive     CIPROFLOXACIN <=0.25 SENSITIVE Sensitive     GENTAMICIN <=1 SENSITIVE Sensitive     IMIPENEM <=0.25 SENSITIVE Sensitive     TRIMETH/SULFA <=20 SENSITIVE Sensitive     AMPICILLIN/SULBACTAM >=32 RESISTANT Resistant     PIP/TAZO <=4 SENSITIVE Sensitive     * ESCHERICHIA COLI  Blood Culture ID Panel (Reflexed)     Status: Abnormal   Collection Time: 06/21/20  8:20 PM  Result Value Ref Range Status   Enterococcus faecalis NOT DETECTED NOT DETECTED Final   Enterococcus Faecium NOT DETECTED NOT DETECTED Final   Listeria monocytogenes NOT DETECTED NOT DETECTED Final   Staphylococcus species NOT DETECTED NOT DETECTED Final    Staphylococcus aureus (BCID) NOT DETECTED NOT DETECTED Final   Staphylococcus epidermidis NOT DETECTED NOT DETECTED Final   Staphylococcus lugdunensis NOT DETECTED NOT DETECTED Final   Streptococcus species NOT DETECTED NOT DETECTED Final   Streptococcus agalactiae NOT DETECTED NOT DETECTED Final   Streptococcus pneumoniae NOT DETECTED NOT DETECTED Final   Streptococcus pyogenes NOT DETECTED NOT DETECTED Final   A.calcoaceticus-baumannii NOT DETECTED NOT DETECTED Final   Bacteroides fragilis NOT DETECTED NOT DETECTED Final   Enterobacterales DETECTED (A) NOT DETECTED Final    Comment: Enterobacterales represent a large order of gram negative bacteria, not a single organism. CRITICAL RESULT CALLED TO, READ BACK BY AND VERIFIED WITH: ALEX CHAPPELL 06/22/20 AT 0849 BY ACR    Enterobacter cloacae complex NOT DETECTED NOT DETECTED Final   Escherichia coli DETECTED (A) NOT DETECTED Final    Comment: CRITICAL RESULT CALLED TO, READ BACK BY AND VERIFIED WITH: ALEX CHAPPELL 06/22/20 AT 0849 BY ACR    Klebsiella aerogenes NOT DETECTED NOT DETECTED Final   Klebsiella oxytoca NOT DETECTED NOT DETECTED Final   Klebsiella pneumoniae NOT DETECTED NOT DETECTED Final   Proteus species NOT DETECTED NOT DETECTED Final   Salmonella species NOT DETECTED NOT DETECTED Final   Serratia marcescens NOT DETECTED NOT DETECTED Final   Haemophilus influenzae NOT DETECTED NOT DETECTED Final   Neisseria meningitidis NOT DETECTED NOT DETECTED Final   Pseudomonas aeruginosa NOT DETECTED NOT DETECTED Final   Stenotrophomonas maltophilia NOT DETECTED NOT DETECTED Final   Candida albicans NOT DETECTED NOT DETECTED Final   Candida auris NOT DETECTED NOT DETECTED Final   Candida glabrata NOT DETECTED NOT DETECTED Final   Candida krusei NOT DETECTED NOT DETECTED Final   Candida parapsilosis NOT DETECTED NOT DETECTED Final   Candida tropicalis NOT DETECTED NOT DETECTED Final   Cryptococcus neoformans/gattii NOT DETECTED NOT  DETECTED Final   CTX-M ESBL NOT DETECTED NOT DETECTED Final   Carbapenem resistance IMP NOT DETECTED NOT DETECTED Final   Carbapenem resistance KPC NOT DETECTED NOT DETECTED Final   Carbapenem resistance NDM NOT DETECTED NOT DETECTED Final   Carbapenem resist OXA 48 LIKE NOT DETECTED NOT DETECTED Final   Carbapenem resistance VIM NOT DETECTED NOT DETECTED Final    Comment: Performed at [facility], [address], [place], [place] [address]  Resp Panel by  RT-PCR (Flu A&B, Covid) Urine, Catheterized     Status: None   Collection Time: 06/21/20  8:21 PM   Specimen: Urine, Catheterized; Nasopharyngeal(NP) swabs in vial transport medium  Result Value Ref Range Status   SARS Coronavirus 2 by RT PCR NEGATIVE NEGATIVE Final    Comment: (NOTE) SARS-CoV-2 target nucleic acids are NOT DETECTED.  The SARS-CoV-2 RNA is generally detectable in upper respiratory specimens during the acute phase of infection. The lowest concentration of SARS-CoV-2 viral copies this assay can detect is 138 copies/mL. A negative result does not preclude SARS-Cov-2 infection and should not be used as the sole basis for treatment or other patient management decisions. A negative result may occur with  improper specimen collection/handling, submission of specimen other than nasopharyngeal swab, presence of viral mutation(s) within the areas targeted by this assay, and inadequate number of viral copies(<138 copies/mL). A negative result must be combined with clinical observations, patient history, and epidemiological information. The expected result is Negative.  Fact Sheet for Patients:  BloggerCourse.comhttps://www.fda.gov/media/152166/download  Fact Sheet for Healthcare Providers:  SeriousBroker.ithttps://www.fda.gov/media/152162/download  This test is no t yet approved or cleared by the [place] FDA and  has been authorized for detection and/or diagnosis of SARS-CoV-2 by FDA under an Emergency Use Authorization (EUA). This EUA  will remain  in effect (meaning this test can be used) for the duration of the COVID-19 declaration under Section 564(b)(1) of the Act, 21 U.S.C.section 360bbb-3(b)(1), unless the authorization is terminated  or revoked sooner.       Influenza A by PCR NEGATIVE NEGATIVE Final   Influenza B by PCR NEGATIVE NEGATIVE Final    Comment: (NOTE) The Xpert Xpress SARS-CoV-2/FLU/RSV plus assay is intended as an aid in the diagnosis of influenza from Nasopharyngeal swab specimens and should not be used as a sole basis for treatment. Nasal washings and aspirates are unacceptable for Xpert Xpress SARS-CoV-2/FLU/RSV testing.  Fact Sheet for Patients: BloggerCourse.comhttps://www.fda.gov/media/152166/download  Fact Sheet for Healthcare Providers: SeriousBroker.ithttps://www.fda.gov/media/152162/download  This test is not yet approved or cleared by the [place] FDA and has been authorized for detection and/or diagnosis of SARS-CoV-2 by FDA under an Emergency Use Authorization (EUA). This EUA will remain in effect (meaning this test can be used) for the duration of the COVID-19 declaration under Section 564(b)(1) of the Act, 21 U.S.C. section 360bbb-3(b)(1), unless the authorization is terminated or revoked.  Performed at [facility], [address] [place], [place] [address]   Urine culture     Status: Abnormal   Collection Time: 06/21/20  8:21 PM   Specimen: In/Out Cath Urine  Result Value Ref Range Status   Specimen Description   Final    IN/OUT CATH URINE Performed at [facility], [address] [place], [place] [address]    Special Requests   Final    NONE Performed at [facility], [address] [place], [place] [address]    Culture >=100,000 COLONIES/mL ESCHERICHIA COLI (A)  Final   Report Status 06/24/2020 FINAL  Final   Organism ID, Bacteria ESCHERICHIA COLI (A)  Final      Susceptibility   Escherichia coli - MIC*    AMPICILLIN >=32 RESISTANT Resistant      CEFAZOLIN <=4 SENSITIVE Sensitive     CEFEPIME <=0.12 SENSITIVE Sensitive     CEFTRIAXONE <=0.25 SENSITIVE Sensitive     CIPROFLOXACIN <=0.25 SENSITIVE Sensitive     GENTAMICIN <=1 SENSITIVE Sensitive     IMIPENEM <=0.25 SENSITIVE Sensitive  NITROFURANTOIN <=16 SENSITIVE Sensitive     TRIMETH/SULFA <=20 SENSITIVE Sensitive     AMPICILLIN/SULBACTAM >=32 RESISTANT Resistant     PIP/TAZO <=4 SENSITIVE Sensitive     * >=100,000 COLONIES/mL ESCHERICHIA COLI     Labs: BNP (last 3 results) No results for input(s): BNP in the last 8760 hours. Basic Metabolic Panel: Recent Labs  Lab 06/21/20 2020 06/22/20 0201 06/23/20 1219 06/24/20 0727  NA 131* 134* 130* 132*  K 3.7 3.2* 3.4* 3.8  CL 97* 100 96* 98  CO2 GLUCOSE 215* 197* 328* 204*  BUN 36* 34* 32* 25*  CREATININE 1.51* 1.53* 1.51* 1.36*  CALCIUM 10.7* 10.1 9.1 8.7*  MG 1.1* 0.9* 1.8 2.1  PHOS 2.8 2.4* 1.8* 2.3*   Liver Function Tests: Recent Labs  Lab 06/21/20 2020 06/22/20 0201  AST 27 24  ALT 20 19  ALKPHOS 60 52  BILITOT 0.9 0.5  PROT 6.4* 6.1*  ALBUMIN 2.7* 2.5*   No results for input(s): LIPASE, AMYLASE in the last 168 hours. Recent Labs  Lab 06/21/20 2021  AMMONIA 20   CBC: Recent Labs  Lab 06/21/20 2020 06/22/20 0201  WBC 15.1* 12.5*  NEUTROABS 12.5* 10.8*  HGB 11.4* 10.9*  HCT 35.9* 33.3*  MCV 88.2 87.2  PLT 338 307   Cardiac Enzymes: Recent Labs  Lab 06/21/20 2020  CKTOTAL 23*   BNP: Invalid input(s): POCBNP CBG: Recent Labs  Lab 06/25/20 1507 06/25/20 1610 06/25/20 1946 06/26/20 0830 06/26/20 1133  GLUCAP 252* 257* 258* 170*  170*  170*  170* 290*   D-Dimer No results for input(s): DDIMER in the last 72 hours. Hgb A1c No results for input(s): HGBA1C in the last 72 hours. Lipid Profile No results for input(s): CHOL, HDL, LDLCALC, TRIG, CHOLHDL, LDLDIRECT in the last 72 hours. Thyroid function studies No results for input(s): TSH, T4TOTAL, T3FREE, THYROIDAB  in the last 72 hours.  Invalid input(s): FREET3 Anemia work up No results for input(s): VITAMINB12, FOLATE, FERRITIN, TIBC, IRON, RETICCTPCT in the last 72 hours. Urinalysis    Component Value Date/Time   COLORURINE YELLOW (A) 06/21/2020 2021   APPEARANCEUR CLOUDY (A) 06/21/2020 2021   LABSPEC 1.010 06/21/2020 2021   PHURINE 5.0 06/21/2020 2021   GLUCOSEU 150 (A) 06/21/2020 2021   HGBUR SMALL (A) 06/21/2020 2021   HGBUR trace-intact 05/01/2008 1218   BILIRUBINUR NEGATIVE 06/21/2020 2021   BILIRUBINUR Negative 05/28/2020 1707   KETONESUR NEGATIVE 06/21/2020 2021   PROTEINUR 100 (A) 06/21/2020 2021   UROBILINOGEN 0.2 05/28/2020 1707   UROBILINOGEN 0.2 05/01/2008 1218   NITRITE NEGATIVE 06/21/2020 2021   LEUKOCYTESUR LARGE (A) 06/21/2020 2021   Sepsis Labs Invalid input(s): PROCALCITONIN,  WBC,  LACTICIDVEN Microbiology Recent Results (from the past 240 hour(s))  Blood culture (routine single)     Status: Abnormal   Collection Time: 06/21/20  8:20 PM   Specimen: BLOOD  Result Value Ref Range Status   Specimen Description   Final    BLOOD LEFT ANTECUBITAL Performed at [facility], [address] [place], [place] [address]    Special Requests   Final    BOTTLES DRAWN AEROBIC AND ANAEROBIC Blood Culture results may not be optimal due to an inadequate volume of blood received in culture bottles Performed at [facility], [address] [place], [place] [address]    Culture  Setup Time   Final    PEDIATRICS AEROBIC BOTTLE ONLY GRAM NEGATIVE RODS CRITICAL RESULT CALLED TO, READ  BACK BY AND VERIFIED WITH: ALEX CHAPPELL 06/22/20 AT 0849 BY ACR Performed at [facility], [address], [place], [place] [address]    Culture ESCHERICHIA COLI (A)  Final   Report Status 06/24/2020 FINAL  Final   Organism ID, Bacteria ESCHERICHIA COLI  Final      Susceptibility   Escherichia coli - MIC*    AMPICILLIN >=32 RESISTANT Resistant     CEFAZOLIN <=4 SENSITIVE  Sensitive     CEFEPIME <=0.12 SENSITIVE Sensitive     CEFTAZIDIME <=1 SENSITIVE Sensitive     CEFTRIAXONE <=0.25 SENSITIVE Sensitive     CIPROFLOXACIN <=0.25 SENSITIVE Sensitive     GENTAMICIN <=1 SENSITIVE Sensitive     IMIPENEM <=0.25 SENSITIVE Sensitive     TRIMETH/SULFA <=20 SENSITIVE Sensitive     AMPICILLIN/SULBACTAM >=32 RESISTANT Resistant     PIP/TAZO <=4 SENSITIVE Sensitive     * ESCHERICHIA COLI  Blood Culture ID Panel (Reflexed)     Status: Abnormal   Collection Time: 06/21/20  8:20 PM  Result Value Ref Range Status   Enterococcus faecalis NOT DETECTED NOT DETECTED Final   Enterococcus Faecium NOT DETECTED NOT DETECTED Final   Listeria monocytogenes NOT DETECTED NOT DETECTED Final   Staphylococcus species NOT DETECTED NOT DETECTED Final   Staphylococcus aureus (BCID) NOT DETECTED NOT DETECTED Final   Staphylococcus epidermidis NOT DETECTED NOT DETECTED Final   Staphylococcus lugdunensis NOT DETECTED NOT DETECTED Final   Streptococcus species NOT DETECTED NOT DETECTED Final   Streptococcus agalactiae NOT DETECTED NOT DETECTED Final   Streptococcus pneumoniae NOT DETECTED NOT DETECTED Final   Streptococcus pyogenes NOT DETECTED NOT DETECTED Final   A.calcoaceticus-baumannii NOT DETECTED NOT DETECTED Final   Bacteroides fragilis NOT DETECTED NOT DETECTED Final   Enterobacterales DETECTED (A) NOT DETECTED Final    Comment: Enterobacterales represent a large order of gram negative bacteria, not a single organism. CRITICAL RESULT CALLED TO, READ BACK BY AND VERIFIED WITH: ALEX CHAPPELL 06/22/20 AT 0849 BY ACR    Enterobacter cloacae complex NOT DETECTED NOT DETECTED Final   Escherichia coli DETECTED (A) NOT DETECTED Final    Comment: CRITICAL RESULT CALLED TO, READ BACK BY AND VERIFIED WITH: ALEX CHAPPELL 06/22/20 AT 0849 BY ACR    Klebsiella aerogenes NOT DETECTED NOT DETECTED Final   Klebsiella oxytoca NOT DETECTED NOT DETECTED Final   Klebsiella pneumoniae NOT DETECTED  NOT DETECTED Final   Proteus species NOT DETECTED NOT DETECTED Final   Salmonella species NOT DETECTED NOT DETECTED Final   Serratia marcescens NOT DETECTED NOT DETECTED Final   Haemophilus influenzae NOT DETECTED NOT DETECTED Final   Neisseria meningitidis NOT DETECTED NOT DETECTED Final   Pseudomonas aeruginosa NOT DETECTED NOT DETECTED Final   Stenotrophomonas maltophilia NOT DETECTED NOT DETECTED Final   Candida albicans NOT DETECTED NOT DETECTED Final   Candida auris NOT DETECTED NOT DETECTED Final   Candida glabrata NOT DETECTED NOT DETECTED Final   Candida krusei NOT DETECTED NOT DETECTED Final   Candida parapsilosis NOT DETECTED NOT DETECTED Final   Candida tropicalis NOT DETECTED NOT DETECTED Final   Cryptococcus neoformans/gattii NOT DETECTED NOT DETECTED Final   CTX-M ESBL NOT DETECTED NOT DETECTED Final   Carbapenem resistance IMP NOT DETECTED NOT DETECTED Final   Carbapenem resistance KPC NOT DETECTED NOT DETECTED Final   Carbapenem resistance NDM NOT DETECTED NOT DETECTED Final   Carbapenem resist OXA 48 LIKE NOT DETECTED NOT DETECTED Final   Carbapenem resistance VIM NOT DETECTED NOT DETECTED Final  Comment: Performed at [facility], [address] [place], [place] [address]  Resp Panel by RT-PCR (Flu A&B, Covid) Urine, Catheterized     Status: None   Collection Time: 06/21/20  8:21 PM   Specimen: Urine, Catheterized; Nasopharyngeal(NP) swabs in vial transport medium  Result Value Ref Range Status   SARS Coronavirus 2 by RT PCR NEGATIVE NEGATIVE Final    Comment: (NOTE) SARS-CoV-2 target nucleic acids are NOT DETECTED.  The SARS-CoV-2 RNA is generally detectable in upper respiratory specimens during the acute phase of infection. The lowest concentration of SARS-CoV-2 viral copies this assay can detect is 138 copies/mL. A negative result does not preclude SARS-Cov-2 infection and should not be used as the sole basis for treatment or other patient  management decisions. A negative result may occur with  improper specimen collection/handling, submission of specimen other than nasopharyngeal swab, presence of viral mutation(s) within the areas targeted by this assay, and inadequate number of viral copies(<138 copies/mL). A negative result must be combined with clinical observations, patient history, and epidemiological information. The expected result is Negative.  Fact Sheet for Patients:  BloggerCourse.com  Fact Sheet for Healthcare Providers:  SeriousBroker.it  This test is no t yet approved or cleared by the [place] FDA and  has been authorized for detection and/or diagnosis of SARS-CoV-2 by FDA under an Emergency Use Authorization (EUA). This EUA will remain  in effect (meaning this test can be used) for the duration of the COVID-19 declaration under Section 564(b)(1) of the Act, 21 U.S.C.section 360bbb-3(b)(1), unless the authorization is terminated  or revoked sooner.       Influenza A by PCR NEGATIVE NEGATIVE Final   Influenza B by PCR NEGATIVE NEGATIVE Final    Comment: (NOTE) The Xpert Xpress SARS-CoV-2/FLU/RSV plus assay is intended as an aid in the diagnosis of influenza from Nasopharyngeal swab specimens and should not be used as a sole basis for treatment. Nasal washings and aspirates are unacceptable for Xpert Xpress SARS-CoV-2/FLU/RSV testing.  Fact Sheet for Patients: BloggerCourse.com  Fact Sheet for Healthcare Providers: SeriousBroker.it  This test is not yet approved or cleared by the [place] FDA and has been authorized for detection and/or diagnosis of SARS-CoV-2 by FDA under an Emergency Use Authorization (EUA). This EUA will remain in effect (meaning this test can be used) for the duration of the COVID-19 declaration under Section 564(b)(1) of the Act, 21 U.S.C. section 360bbb-3(b)(1),  unless the authorization is terminated or revoked.  Performed at [facility], [address], [place], [place] [address]   Urine culture     Status: Abnormal   Collection Time: 06/21/20  8:21 PM   Specimen: In/Out Cath Urine  Result Value Ref Range Status   Specimen Description   Final    IN/OUT CATH URINE Performed at [facility], [address] [place], [place] [address]    Special Requests   Final    NONE Performed at [facility], [address] [place], [place] [address]    Culture >=100,000 COLONIES/mL ESCHERICHIA COLI (A)  Final   Report Status 06/24/2020 FINAL  Final   Organism ID, Bacteria ESCHERICHIA COLI (A)  Final      Susceptibility   Escherichia coli - MIC*    AMPICILLIN >=32 RESISTANT Resistant     CEFAZOLIN <=4 SENSITIVE Sensitive     CEFEPIME <=0.12 SENSITIVE Sensitive     CEFTRIAXONE <=0.25 SENSITIVE Sensitive     CIPROFLOXACIN <=0.25 SENSITIVE Sensitive  GENTAMICIN <=1 SENSITIVE Sensitive     IMIPENEM <=0.25 SENSITIVE Sensitive     NITROFURANTOIN <=16 SENSITIVE Sensitive     TRIMETH/SULFA <=20 SENSITIVE Sensitive     AMPICILLIN/SULBACTAM >=32 RESISTANT Resistant     PIP/TAZO <=4 SENSITIVE Sensitive     * >=100,000 COLONIES/mL ESCHERICHIA COLI     Time coordinating discharge: Over 30 minutes  SIGNED:   Tresa Moore, MD  [facility] 06/26/2020, 11:54 AM Pager   If 7PM-7AM, please contact night-coverage

## 2020-06-26 NOTE — Plan of Care (Signed)

## 2020-06-26 NOTE — TOC Transition Note (Signed)
Transition of Care [facility]) - CM/SW Discharge Note   Patient Details  Name: Amanda Benson MRN: [id number] Date of Birth: 1935-09-23  [facility]) CM/SW Contact:  Caryn Section, RN Phone Number: 06/26/2020, 12:45 PM   Clinical Narrative:   TOC in to see patient and daughter at bedside re: Discharge planning.  Patient and daughter are still wanting discharge home rather than team recommendations for patient to be discharged to SNF.  TOC discussed the follow up re: lift requested not covered by insurance.  Patient and daughter state they are aware and fine without the lift, and she and the rest of the family still feel confident that they can care for the patient at home.  Daughter states they have spoken with the rest of the care team regarding this decision as well.  Daughter will take patient to appointments, and medications are delivered to the patient's home.  [place] [facility] notified of discharge today.Patient and daughter state no questions or concerns at this point.  TOC information given to patient for any questions prior to discharge,TOC signing off.        Barriers to Discharge: Continued Medical Work up   Patient Goals and CMS Choice        Discharge Placement                       Discharge Plan and Services     Post Acute Care Choice: Home Health                               Social Determinants of Health (SDOH) Interventions     Readmission Risk Interventions No flowsheet data found.

## 2020-06-26 NOTE — Progress Notes (Signed)
Amanda Benson to be D/C'd Home per MD order.  Discussed prescriptions and follow up appointments with the patient and daughter. Prescriptions given to patient, medication list explained in detail. Pt verbalized understanding.  Allergies as of 06/26/2020      Reactions   Alendronate Sodium    REACTION: heartburn      Medication List    STOP taking these medications   cephALEXin 500 MG capsule Commonly known as: KEFLEX   hydrochlorothiazide 12.5 MG tablet Commonly known as: HYDRODIURIL   losartan 50 MG tablet Commonly known as: COZAAR     TAKE these medications   amLODipine 10 MG tablet Commonly known as: NORVASC TAKE 1 TABLET(10 MG) BY MOUTH DAILY   aspirin 81 MG EC tablet Take 81 mg by mouth daily.   buPROPion 300 MG 24 hr tablet Commonly known as: WELLBUTRIN XL TAKE 1 TABLET(300 MG) BY MOUTH DAILY   cefpodoxime 200 MG tablet Commonly known as: VANTIN Take 1 tablet (200 mg total) by mouth 2 (two) times daily for 7 days.   cholecalciferol 25 MCG (1000 UNIT) tablet Commonly known as: VITAMIN D3 Take 1,000 Units by mouth daily.   FLUoxetine 40 MG capsule Commonly known as: PROZAC TAKE 1 CAPSULE(40 MG) BY MOUTH DAILY   glipiZIDE 10 MG 24 hr tablet Commonly known as: GLUCOTROL XL TAKE 1 TABLET(10 MG) BY MOUTH DAILY   Insulin Pen Needle 29G X Misc Use with insulin daily   losartan-hydrochlorothiazide 100-25 MG tablet Commonly known as: HYZAAR Take 0.5 tablets by mouth daily.   metoprolol succinate 25 MG 24 hr tablet Commonly known as: TOPROL-XL TAKE 1 TABLET(25 MG) BY MOUTH DAILY   Ozempic (0.25 or 0.5 MG/DOSE) 2 MG/1.5ML Sopn Generic drug: Semaglutide(0.25 or 0.5MG /DOS) Inject 0.25 mg into the skin once a week.   pantoprazole 40 MG tablet Commonly known as: PROTONIX Take 1 tablet (40 mg total) by mouth daily.   raloxifene 60 MG tablet Commonly known as: EVISTA TAKE 1 TABLET(60 MG) BY MOUTH DAILY   simvastatin 20 MG tablet Commonly known as:  ZOCOR TAKE 1 TABLET(20 MG) BY MOUTH DAILY       Vitals:   06/26/20 1132 06/26/20 1547  BP: (!) 138/50 (!) 153/53  Pulse: 70 71  Resp: 18 18  Temp: 98.2 F (36.8 C) 99.3 F (37.4 C)  SpO2: 90% 91%    Skin clean, dry and intact without evidence of skin break down, no evidence of skin tears noted. IV catheter discontinued intact. Site without signs and symptoms of complications. Dressing and pressure applied. Pt denies pain at this time. No complaints noted.  An After Visit Summary was printed and given to the patient. Patient escorted via WC, and D/C home via private auto.  Rigoberto Noel

## 2020-06-26 NOTE — Care Management Important Message (Signed)
Important Message  Patient Details  Name: Amanda Benson MRN: [id number] Date of Birth: 28-Aug-1935   Medicare Important Message Given:  Yes     [name] 06/26/2020, 11:13 AM

## 2020-06-26 NOTE — Progress Notes (Signed)
Nutrition Follow-up  DOCUMENTATION CODES:   Not applicable  INTERVENTION:   -Continue MVI with minerals daily -D/c Ensure Enlive po BID, each supplement provides 350 kcal and 20 grams of protein -Glucerna Shake po TID, each supplement provides 220 kcal and 10 grams of protein  NUTRITION DIAGNOSIS:   Inadequate oral intake related to decreased appetite as evidenced by meal completion < 50%.  Ongoing  GOAL:   Patient will meet greater than or equal to 90% of their needs  Progressing   MONITOR:   PO intake,Supplement acceptance,Labs,Weight trends,Diet advancement,Skin,I & O's  REASON FOR ASSESSMENT:   Consult Assessment of nutrition requirement/status  ASSESSMENT:   85 y.o. female with medical history significant of DM2, HTN,  HLD  3/21- s/p BSE- advanced to dysphagia 3 diet with thin liquids  Reviewed I/O's: *755 ml x 24 hours and +541 ml since admission  UOP: 1.4 L x 24 hours  Pt unavailable at time of visit.   Pt with improved appetite. Noted meal completions 100%. Pt family is assisting with feeding pt. She is consuming Ensure Enlive supplements. Due to increased oral intake and hyperglycemia, will transition to Glucerna shakes.   Per TOC notes, plan to discharge home with home health services once medically stable.   Medications reviewed and include magnesium oxide, KCl, and vitamin B-12.   Labs reviewed: CBGS: 170-257 (inpatient orders for glycemic control are 0-5 units insulin aspart daily at bedtime, 0-9 units insulin asaprt TID with meals, 6 units insulin aspart TID with meals, and 10 units insulin glargine daily).   Diet Order:   Diet Order            DIET DYS 3 Room service appropriate? Yes; Fluid consistency: Thin  Diet effective 1400                 EDUCATION NEEDS:   Education needs have been addressed  Skin:  Skin Assessment: Reviewed RN Assessment  Last BM:  06/23/20  Height:   Ht Readings from Last 1 Encounters:  06/21/20 5\' 8"   (1.727 m)    Weight:   Wt Readings from Last 1 Encounters:  06/21/20 60 kg    Ideal Body Weight:  63.6 kg  BMI:  Body mass index is 20.11 kg/m.  Estimated Nutritional Needs:   Kcal:  1600-1800  Protein:  80-95 grams  Fluid:  > 1.6 L    06/23/20, RD, LDN, CDCES Registered Dietitian II Certified Diabetes Care and Education Specialist Please refer to [facility] for RD and/or RD on-call/weekend/after hours pager

## 2020-06-27 ENCOUNTER — Telehealth: Payer: Self-pay

## 2020-06-27 NOTE — Telephone Encounter (Signed)
Transition Care Management Follow-up Telephone Call  Date of discharge and from where: 06/26/2020, [facility]  How have you been since you were released from the hospital? Spoke with Amanda Benson (daughter, HIPAA verfied). Patient is doing much better and is resting comfortably at home.   Any questions or concerns? No  Items Reviewed:  Did the pt receive and understand the discharge instructions provided? Yes   Medications obtained and verified? Yes   Other? No   Any new allergies since your discharge? No   Dietary orders reviewed? Yes  Do you have support at home? Yes   Home Care and Equipment/Supplies: Were home health services ordered? yes If so, what is the name of the agency? [facility]  Has the agency set up a time to come to the patient's home? no Were any new equipment or medical supplies ordered?  No What is the name of the medical supply agency? N/A Were you able to get the supplies/equipment? not applicable Do you have any questions related to the use of the equipment or supplies? No  Functional Questionnaire: (I = Independent and D = Dependent) ADLs: I  Bathing/Dressing- I  Meal Prep- I  Eating- I  Maintaining continence- I  Transferring/Ambulation- I  Managing Meds- I  Follow up appointments reviewed:   PCP Hospital f/u appt confirmed? No  Will call back and schedule follow up at later date when patient gets to feeling a little better.  Specialist Hospital f/u appt confirmed? N/A   Are transportation arrangements needed? No   If their condition worsens, is the pt aware to call PCP or go to the Emergency Dept.? Yes  Was the patient provided with contact information for the PCP's office or ED? Yes  Was to pt encouraged to call back with questions or concerns? Yes

## 2020-06-28 DIAGNOSIS — M810 Age-related osteoporosis without current pathological fracture: Secondary | ICD-10-CM | POA: Diagnosis not present

## 2020-06-28 DIAGNOSIS — E1165 Type 2 diabetes mellitus with hyperglycemia: Secondary | ICD-10-CM | POA: Diagnosis not present

## 2020-06-28 DIAGNOSIS — N3001 Acute cystitis with hematuria: Secondary | ICD-10-CM | POA: Diagnosis not present

## 2020-06-28 DIAGNOSIS — E1169 Type 2 diabetes mellitus with other specified complication: Secondary | ICD-10-CM | POA: Diagnosis not present

## 2020-06-28 DIAGNOSIS — E1151 Type 2 diabetes mellitus with diabetic peripheral angiopathy without gangrene: Secondary | ICD-10-CM | POA: Diagnosis not present

## 2020-06-28 DIAGNOSIS — I129 Hypertensive chronic kidney disease with stage 1 through stage 4 chronic kidney disease, or unspecified chronic kidney disease: Secondary | ICD-10-CM | POA: Diagnosis not present

## 2020-06-28 DIAGNOSIS — E1122 Type 2 diabetes mellitus with diabetic chronic kidney disease: Secondary | ICD-10-CM | POA: Diagnosis not present

## 2020-06-28 DIAGNOSIS — N1832 Chronic kidney disease, stage 3b: Secondary | ICD-10-CM | POA: Diagnosis not present

## 2020-06-28 DIAGNOSIS — F418 Other specified anxiety disorders: Secondary | ICD-10-CM | POA: Diagnosis not present

## 2020-06-30 ENCOUNTER — Telehealth: Payer: Self-pay

## 2020-06-30 ENCOUNTER — Telehealth: Payer: Self-pay | Admitting: *Deleted

## 2020-06-30 DIAGNOSIS — I129 Hypertensive chronic kidney disease with stage 1 through stage 4 chronic kidney disease, or unspecified chronic kidney disease: Secondary | ICD-10-CM | POA: Diagnosis not present

## 2020-06-30 DIAGNOSIS — E1151 Type 2 diabetes mellitus with diabetic peripheral angiopathy without gangrene: Secondary | ICD-10-CM | POA: Diagnosis not present

## 2020-06-30 DIAGNOSIS — E1165 Type 2 diabetes mellitus with hyperglycemia: Secondary | ICD-10-CM | POA: Diagnosis not present

## 2020-06-30 DIAGNOSIS — M810 Age-related osteoporosis without current pathological fracture: Secondary | ICD-10-CM | POA: Diagnosis not present

## 2020-06-30 DIAGNOSIS — N3001 Acute cystitis with hematuria: Secondary | ICD-10-CM | POA: Diagnosis not present

## 2020-06-30 DIAGNOSIS — E1169 Type 2 diabetes mellitus with other specified complication: Secondary | ICD-10-CM | POA: Diagnosis not present

## 2020-06-30 DIAGNOSIS — N1832 Chronic kidney disease, stage 3b: Secondary | ICD-10-CM | POA: Diagnosis not present

## 2020-06-30 DIAGNOSIS — E1122 Type 2 diabetes mellitus with diabetic chronic kidney disease: Secondary | ICD-10-CM | POA: Diagnosis not present

## 2020-06-30 DIAGNOSIS — F418 Other specified anxiety disorders: Secondary | ICD-10-CM | POA: Diagnosis not present

## 2020-06-30 NOTE — Telephone Encounter (Signed)
-----   Message from Vanita Panda Sing sent at 06/30/2020  3:51 PM EDT ----- Regarding: Refill Contact: [phone] Patient needs refills on on the following medications from Dr. Milinda Antis. Please send to [facility] if appropriate.   Amlodipine 10 mg Fluoxetine 40 mg Glipizide ER 10 mg Metoprolol succinate 25 mg  Thank you, [employer]

## 2020-06-30 NOTE — Chronic Care Management (AMB) (Addendum)
Chronic Care Management Pharmacy Assistant   Name: FRANCHESKA VILLEDA  MRN: [id number] DOB: [birth year]  Reason for Encounter: Medication Review- Medication Adherence and Delivery Coordination    Recent office visits:  No visits since last CCM contact  Recent consult visits:  No visits since last CCM contact  Hospital visits:  Medication Reconciliation was completed by comparing discharge summary, patient's EMR and Pharmacy list, and upon discussion with patient.  Admitted to the hospital on 06/21/20 due to sepsis. Discharge date was 06/26/20. Discharged from [facility].    New?Medications Started at [facility] Discharge:?? -Cefpodoxime 200 mg twice daily x7 days.  Medication Changes at Hospital Discharge: -No changes to medications  Medications Discontinued at Hospital Discharge: -no medications discontinued  Medications that remain the same after Hospital Discharge:??  -All other medications will remain the same.    Medications: Outpatient Encounter Medications as of 06/30/2020  Medication Sig   amLODipine (NORVASC) 10 MG tablet TAKE 1 TABLET(10 MG) BY MOUTH DAILY   aspirin 81 MG EC tablet Take 81 mg by mouth daily.   buPROPion (WELLBUTRIN XL) 300 MG 24 hr tablet TAKE 1 TABLET(300 MG) BY MOUTH DAILY   cefpodoxime (VANTIN) 200 MG tablet Take 1 tablet (200 mg total) by mouth 2 (two) times daily for 7 days.   cholecalciferol (VITAMIN D3) 25 MCG (1000 UNIT) tablet Take 1,000 Units by mouth daily.   FLUoxetine (PROZAC) 40 MG capsule TAKE 1 CAPSULE(40 MG) BY MOUTH DAILY   glipiZIDE (GLUCOTROL XL) 10 MG 24 hr tablet TAKE 1 TABLET(10 MG) BY MOUTH DAILY   Insulin Pen Needle 29G X MISC Use with insulin daily   losartan-hydrochlorothiazide (HYZAAR) 100-25 MG tablet Take 0.5 tablets by mouth daily.   metoprolol succinate (TOPROL-XL) 25 MG 24 hr tablet TAKE 1 TABLET(25 MG) BY MOUTH DAILY   pantoprazole (PROTONIX) 40 MG tablet Take 1 tablet (40 mg total) by mouth daily.    raloxifene (EVISTA) 60 MG tablet TAKE 1 TABLET(60 MG) BY MOUTH DAILY   Semaglutide,0.25 or 0.5MG /DOS, (OZEMPIC, 0.25 OR 0.5 MG/DOSE,) 2 MG/1.5ML SOPN Inject 0.25 mg into the skin once a week.   simvastatin (ZOCOR) 20 MG tablet TAKE 1 TABLET(20 MG) BY MOUTH DAILY   No facility-administered encounter medications on file as of 06/30/2020.   BP Readings from Last 3 Encounters:  06/26/20 (!) 153/53  05/12/20 140/82  02/05/20 132/60    Lab Results  Component Value Date   HGBA1C 8.0 (H) 06/21/2020     Recent OV, Consult or Hospital visit: ED visit/Hospital admission 06/21/20 No medication changes indicated  Patient obtains medications through Adherence Packaging  30 Days   Last adherence delivery date: 06/06/20  Patient is due for next adherence delivery on: 07/07/20  Contacted patient and reviewed medications and coordinated delivery.  This delivery to include: Adherence Packaging  30 Days  Packs: Multivitamin (Women's One A Day) - 1 tablet daily (1 breakfast)  Amlodipine 10mg - 1 tablet daily (1 breakfast) Aspirin 81mg -1 tablet daily (1 evening meal)  Bupropion 300mg  24 hr-1 tablet daily (1 breakfast)  Calcium/Vitamin D (800 units Vit D/600mg  Calcium)- 1 tablet daily (1 evening meal)  Fluoxetine 40mg  - 1 capsule daily (1 breakfast)  Glipizide 10mg  XL - 1 tablet daily (1 breakfast)  Hydrochlorothiazide 12.5 mg- 1 tablet (1 breakfast) Losartan 50mg  - 1 tablet daily (1 breakfast) Metoprolol Succinate 25mg - 1 tablet daily (1 evening meal)  Raloxifene 60mg -1 tablet daily (1 breakfast)  Simvastatin 20mg -1 tablet daily (1 evening meal)  Patient  declined the following medications this month: Januvia 50mg  d/c - replaced with Ozempic  Lantus Solostar U-100 - 10 units daily (replaced with Ozempic) Losartan/HCTZ 100-25mg -   tablet daily(  breakfast) - Discontinued. Replaced with individual components  Metformin 1000,g- D/C due to kidney function Ozempic Inj 2/1.5 ML inject 0.25 mg into skin  once a week - Getting from patient assistance       Pantoprazole 40 mg- 1 tablet daily- Daughter states it was only short term prescription.    Patient needs refills on the following medications:  Amlodipine 10 mg Fluoxetine 40 mg Glipizide ER 10 mg Metoprolol succinate 25 mg All above medications have been requested from from Dr. 06/30/20  Confirmed delivery date of 07/07/20, advised patient that pharmacy will contact her the morning of delivery.  Recent blood pressure readings are as follows: Before medications: 182/60 172/61 178/56  same day after medication 139/55 159/63 same day after medication 136/66  Recent blood glucose readings are as follows: Fasting: 267, 216, 241, 231  Star Rating Drugs:  Medication:  Last Fill: Day Supply Glipizide 10 mg 06/04/20  30 Losartan HCTZ 100-25  06/04/20 30 Simvastatin 20 mg 06/04/20  30  Daughter states that Ms. Shearman was admitted to the hospital from the ED due to UTI. She is now feeling much better. She does note that since she has been home her blood sugars have been elevated. Currently taking cefpodoxime BID.  Follow-Up:  Coordination of Enhanced Pharmacy Services and Pharmacist Review  08/04/20, CPP notified  Phil Dopp, [facility] Clinical Pharmacy Assistant [phone]  I have reviewed the care management and care coordination activities outlined in this encounter and I am certifying that I agree with the content of this note. BP improved after taking meds. BG elevated with recent infection, would expect to come down soon. Also pt recently restarted on Ozempic 0.25 mg weekly as of 06/16/20. Will discuss increasing to the 0.5 mg in 2 more weeks.  06/18/20, PharmD Clinical Pharmacist [facility] [phone]

## 2020-07-01 MED ORDER — METOPROLOL SUCCINATE ER 25 MG PO TB24: ORAL_TABLET | ORAL | 1 refills | Status: DC

## 2020-07-01 MED ORDER — GLIPIZIDE ER 10 MG PO TB24: ORAL_TABLET | ORAL | 1 refills | Status: DC

## 2020-07-01 MED ORDER — FLUOXETINE HCL 40 MG PO CAPS: ORAL_CAPSULE | ORAL | 1 refills | Status: DC

## 2020-07-01 MED ORDER — AMLODIPINE BESYLATE 10 MG PO TABS: ORAL_TABLET | ORAL | 1 refills | Status: DC

## 2020-07-01 NOTE — Telephone Encounter (Signed)
Rxs sent

## 2020-07-02 DIAGNOSIS — E1122 Type 2 diabetes mellitus with diabetic chronic kidney disease: Secondary | ICD-10-CM | POA: Diagnosis not present

## 2020-07-02 DIAGNOSIS — N3001 Acute cystitis with hematuria: Secondary | ICD-10-CM | POA: Diagnosis not present

## 2020-07-02 DIAGNOSIS — I129 Hypertensive chronic kidney disease with stage 1 through stage 4 chronic kidney disease, or unspecified chronic kidney disease: Secondary | ICD-10-CM | POA: Diagnosis not present

## 2020-07-02 DIAGNOSIS — E1151 Type 2 diabetes mellitus with diabetic peripheral angiopathy without gangrene: Secondary | ICD-10-CM | POA: Diagnosis not present

## 2020-07-02 DIAGNOSIS — E1169 Type 2 diabetes mellitus with other specified complication: Secondary | ICD-10-CM | POA: Diagnosis not present

## 2020-07-02 DIAGNOSIS — N1832 Chronic kidney disease, stage 3b: Secondary | ICD-10-CM | POA: Diagnosis not present

## 2020-07-02 DIAGNOSIS — F418 Other specified anxiety disorders: Secondary | ICD-10-CM | POA: Diagnosis not present

## 2020-07-02 DIAGNOSIS — M810 Age-related osteoporosis without current pathological fracture: Secondary | ICD-10-CM | POA: Diagnosis not present

## 2020-07-02 DIAGNOSIS — E1165 Type 2 diabetes mellitus with hyperglycemia: Secondary | ICD-10-CM | POA: Diagnosis not present

## 2020-07-04 DIAGNOSIS — F418 Other specified anxiety disorders: Secondary | ICD-10-CM | POA: Diagnosis not present

## 2020-07-04 DIAGNOSIS — N3001 Acute cystitis with hematuria: Secondary | ICD-10-CM | POA: Diagnosis not present

## 2020-07-04 DIAGNOSIS — E1151 Type 2 diabetes mellitus with diabetic peripheral angiopathy without gangrene: Secondary | ICD-10-CM | POA: Diagnosis not present

## 2020-07-04 DIAGNOSIS — M810 Age-related osteoporosis without current pathological fracture: Secondary | ICD-10-CM | POA: Diagnosis not present

## 2020-07-04 DIAGNOSIS — Z9181 History of falling: Secondary | ICD-10-CM

## 2020-07-04 DIAGNOSIS — E1122 Type 2 diabetes mellitus with diabetic chronic kidney disease: Secondary | ICD-10-CM | POA: Diagnosis not present

## 2020-07-04 DIAGNOSIS — E785 Hyperlipidemia, unspecified: Secondary | ICD-10-CM

## 2020-07-04 DIAGNOSIS — Z87440 Personal history of urinary (tract) infections: Secondary | ICD-10-CM

## 2020-07-04 DIAGNOSIS — Z9049 Acquired absence of other specified parts of digestive tract: Secondary | ICD-10-CM

## 2020-07-04 DIAGNOSIS — I129 Hypertensive chronic kidney disease with stage 1 through stage 4 chronic kidney disease, or unspecified chronic kidney disease: Secondary | ICD-10-CM | POA: Diagnosis not present

## 2020-07-04 DIAGNOSIS — Z794 Long term (current) use of insulin: Secondary | ICD-10-CM

## 2020-07-04 DIAGNOSIS — E1165 Type 2 diabetes mellitus with hyperglycemia: Secondary | ICD-10-CM | POA: Diagnosis not present

## 2020-07-04 DIAGNOSIS — E1169 Type 2 diabetes mellitus with other specified complication: Secondary | ICD-10-CM | POA: Diagnosis not present

## 2020-07-04 DIAGNOSIS — N1832 Chronic kidney disease, stage 3b: Secondary | ICD-10-CM | POA: Diagnosis not present

## 2020-07-07 DIAGNOSIS — F418 Other specified anxiety disorders: Secondary | ICD-10-CM | POA: Diagnosis not present

## 2020-07-07 DIAGNOSIS — E1151 Type 2 diabetes mellitus with diabetic peripheral angiopathy without gangrene: Secondary | ICD-10-CM | POA: Diagnosis not present

## 2020-07-07 DIAGNOSIS — N1832 Chronic kidney disease, stage 3b: Secondary | ICD-10-CM | POA: Diagnosis not present

## 2020-07-07 DIAGNOSIS — N3001 Acute cystitis with hematuria: Secondary | ICD-10-CM | POA: Diagnosis not present

## 2020-07-07 DIAGNOSIS — E1122 Type 2 diabetes mellitus with diabetic chronic kidney disease: Secondary | ICD-10-CM | POA: Diagnosis not present

## 2020-07-07 DIAGNOSIS — I129 Hypertensive chronic kidney disease with stage 1 through stage 4 chronic kidney disease, or unspecified chronic kidney disease: Secondary | ICD-10-CM | POA: Diagnosis not present

## 2020-07-07 DIAGNOSIS — M810 Age-related osteoporosis without current pathological fracture: Secondary | ICD-10-CM | POA: Diagnosis not present

## 2020-07-07 DIAGNOSIS — E1169 Type 2 diabetes mellitus with other specified complication: Secondary | ICD-10-CM | POA: Diagnosis not present

## 2020-07-07 DIAGNOSIS — E1165 Type 2 diabetes mellitus with hyperglycemia: Secondary | ICD-10-CM | POA: Diagnosis not present

## 2020-07-07 MED ORDER — FLUCONAZOLE 100 MG PO TABS: ORAL_TABLET | ORAL | 0 refills | Status: DC

## 2020-07-07 NOTE — Telephone Encounter (Signed)
I sent the diflucan to take every other day for 3 doses as she did before F/u if no improvement

## 2020-07-09 DIAGNOSIS — E1151 Type 2 diabetes mellitus with diabetic peripheral angiopathy without gangrene: Secondary | ICD-10-CM | POA: Diagnosis not present

## 2020-07-09 DIAGNOSIS — E1169 Type 2 diabetes mellitus with other specified complication: Secondary | ICD-10-CM | POA: Diagnosis not present

## 2020-07-09 DIAGNOSIS — F418 Other specified anxiety disorders: Secondary | ICD-10-CM | POA: Diagnosis not present

## 2020-07-09 DIAGNOSIS — M810 Age-related osteoporosis without current pathological fracture: Secondary | ICD-10-CM | POA: Diagnosis not present

## 2020-07-09 DIAGNOSIS — I129 Hypertensive chronic kidney disease with stage 1 through stage 4 chronic kidney disease, or unspecified chronic kidney disease: Secondary | ICD-10-CM | POA: Diagnosis not present

## 2020-07-09 DIAGNOSIS — N1832 Chronic kidney disease, stage 3b: Secondary | ICD-10-CM | POA: Diagnosis not present

## 2020-07-09 DIAGNOSIS — E1165 Type 2 diabetes mellitus with hyperglycemia: Secondary | ICD-10-CM | POA: Diagnosis not present

## 2020-07-09 DIAGNOSIS — E1122 Type 2 diabetes mellitus with diabetic chronic kidney disease: Secondary | ICD-10-CM | POA: Diagnosis not present

## 2020-07-09 DIAGNOSIS — N3001 Acute cystitis with hematuria: Secondary | ICD-10-CM | POA: Diagnosis not present

## 2020-07-10 DIAGNOSIS — E1122 Type 2 diabetes mellitus with diabetic chronic kidney disease: Secondary | ICD-10-CM | POA: Diagnosis not present

## 2020-07-10 DIAGNOSIS — E1151 Type 2 diabetes mellitus with diabetic peripheral angiopathy without gangrene: Secondary | ICD-10-CM | POA: Diagnosis not present

## 2020-07-10 DIAGNOSIS — I129 Hypertensive chronic kidney disease with stage 1 through stage 4 chronic kidney disease, or unspecified chronic kidney disease: Secondary | ICD-10-CM | POA: Diagnosis not present

## 2020-07-10 DIAGNOSIS — N1832 Chronic kidney disease, stage 3b: Secondary | ICD-10-CM | POA: Diagnosis not present

## 2020-07-10 DIAGNOSIS — M810 Age-related osteoporosis without current pathological fracture: Secondary | ICD-10-CM | POA: Diagnosis not present

## 2020-07-10 DIAGNOSIS — E1169 Type 2 diabetes mellitus with other specified complication: Secondary | ICD-10-CM | POA: Diagnosis not present

## 2020-07-10 DIAGNOSIS — F418 Other specified anxiety disorders: Secondary | ICD-10-CM | POA: Diagnosis not present

## 2020-07-10 DIAGNOSIS — N3001 Acute cystitis with hematuria: Secondary | ICD-10-CM | POA: Diagnosis not present

## 2020-07-10 DIAGNOSIS — E1165 Type 2 diabetes mellitus with hyperglycemia: Secondary | ICD-10-CM | POA: Diagnosis not present

## 2020-07-14 DIAGNOSIS — I129 Hypertensive chronic kidney disease with stage 1 through stage 4 chronic kidney disease, or unspecified chronic kidney disease: Secondary | ICD-10-CM | POA: Diagnosis not present

## 2020-07-14 DIAGNOSIS — N3001 Acute cystitis with hematuria: Secondary | ICD-10-CM | POA: Diagnosis not present

## 2020-07-14 DIAGNOSIS — E1151 Type 2 diabetes mellitus with diabetic peripheral angiopathy without gangrene: Secondary | ICD-10-CM | POA: Diagnosis not present

## 2020-07-14 DIAGNOSIS — M810 Age-related osteoporosis without current pathological fracture: Secondary | ICD-10-CM | POA: Diagnosis not present

## 2020-07-14 DIAGNOSIS — N1832 Chronic kidney disease, stage 3b: Secondary | ICD-10-CM | POA: Diagnosis not present

## 2020-07-14 DIAGNOSIS — E1165 Type 2 diabetes mellitus with hyperglycemia: Secondary | ICD-10-CM | POA: Diagnosis not present

## 2020-07-14 DIAGNOSIS — F418 Other specified anxiety disorders: Secondary | ICD-10-CM | POA: Diagnosis not present

## 2020-07-14 DIAGNOSIS — E1122 Type 2 diabetes mellitus with diabetic chronic kidney disease: Secondary | ICD-10-CM | POA: Diagnosis not present

## 2020-07-14 DIAGNOSIS — E1169 Type 2 diabetes mellitus with other specified complication: Secondary | ICD-10-CM | POA: Diagnosis not present

## 2020-07-15 DIAGNOSIS — R8290 Unspecified abnormal findings in urine: Secondary | ICD-10-CM | POA: Diagnosis not present

## 2020-07-15 DIAGNOSIS — I10 Essential (primary) hypertension: Secondary | ICD-10-CM | POA: Diagnosis not present

## 2020-07-15 DIAGNOSIS — R809 Proteinuria, unspecified: Secondary | ICD-10-CM | POA: Diagnosis not present

## 2020-07-15 DIAGNOSIS — N1832 Chronic kidney disease, stage 3b: Secondary | ICD-10-CM | POA: Diagnosis not present

## 2020-07-15 DIAGNOSIS — D631 Anemia in chronic kidney disease: Secondary | ICD-10-CM | POA: Diagnosis not present

## 2020-07-15 DIAGNOSIS — E1122 Type 2 diabetes mellitus with diabetic chronic kidney disease: Secondary | ICD-10-CM | POA: Diagnosis not present

## 2020-07-18 DIAGNOSIS — E1165 Type 2 diabetes mellitus with hyperglycemia: Secondary | ICD-10-CM | POA: Diagnosis not present

## 2020-07-18 DIAGNOSIS — N1832 Chronic kidney disease, stage 3b: Secondary | ICD-10-CM | POA: Diagnosis not present

## 2020-07-18 DIAGNOSIS — F418 Other specified anxiety disorders: Secondary | ICD-10-CM | POA: Diagnosis not present

## 2020-07-18 DIAGNOSIS — M810 Age-related osteoporosis without current pathological fracture: Secondary | ICD-10-CM | POA: Diagnosis not present

## 2020-07-18 DIAGNOSIS — E1151 Type 2 diabetes mellitus with diabetic peripheral angiopathy without gangrene: Secondary | ICD-10-CM | POA: Diagnosis not present

## 2020-07-18 DIAGNOSIS — N3001 Acute cystitis with hematuria: Secondary | ICD-10-CM | POA: Diagnosis not present

## 2020-07-18 DIAGNOSIS — E1169 Type 2 diabetes mellitus with other specified complication: Secondary | ICD-10-CM | POA: Diagnosis not present

## 2020-07-18 DIAGNOSIS — I129 Hypertensive chronic kidney disease with stage 1 through stage 4 chronic kidney disease, or unspecified chronic kidney disease: Secondary | ICD-10-CM | POA: Diagnosis not present

## 2020-07-18 DIAGNOSIS — E1122 Type 2 diabetes mellitus with diabetic chronic kidney disease: Secondary | ICD-10-CM | POA: Diagnosis not present

## 2020-07-23 DIAGNOSIS — Z01818 Encounter for other preprocedural examination: Secondary | ICD-10-CM | POA: Diagnosis not present

## 2020-07-23 DIAGNOSIS — H25812 Combined forms of age-related cataract, left eye: Secondary | ICD-10-CM | POA: Diagnosis not present

## 2020-07-29 ENCOUNTER — Telehealth: Payer: Self-pay

## 2020-07-29 NOTE — Chronic Care Management (AMB) (Addendum)
Chronic Care Management Pharmacy Assistant   Name: Amanda Benson  MRN: [id number] DOB: [birth year]  Reason for Encounter: Medication Adherence and Delivery coordination  Recent office visits:  None since last CCM contact  Recent consult visits:    07/15/2020 - Dr.Madhu Suezanne Jacquet, Nephrology - Labs ordered  Hospital visits:  [facility]  - 06/21/20 thru 06/26/20 due to sepsis  Medications: Outpatient Encounter Medications as of 07/29/2020  Medication Sig   amLODipine (NORVASC) 10 MG tablet TAKE 1 TABLET(10 MG) BY MOUTH DAILY   aspirin 81 MG EC tablet Take 81 mg by mouth daily.   buPROPion (WELLBUTRIN XL) 300 MG 24 hr tablet TAKE 1 TABLET(300 MG) BY MOUTH DAILY   cholecalciferol (VITAMIN D3) 25 MCG (1000 UNIT) tablet Take 1,000 Units by mouth daily.   fluconazole (DIFLUCAN) 100 MG tablet Take one pill by mouth every other day for 3 doses   FLUoxetine (PROZAC) 40 MG capsule TAKE 1 CAPSULE(40 MG) BY MOUTH DAILY   glipiZIDE (GLUCOTROL XL) 10 MG 24 hr tablet TAKE 1 TABLET(10 MG) BY MOUTH DAILY   Insulin Pen Needle 29G X MISC Use with insulin daily   losartan-hydrochlorothiazide (HYZAAR) 100-25 MG tablet Take 0.5 tablets by mouth daily.   metoprolol succinate (TOPROL-XL) 25 MG 24 hr tablet TAKE 1 TABLET(25 MG) BY MOUTH DAILY   pantoprazole (PROTONIX) 40 MG tablet Take 1 tablet (40 mg total) by mouth daily.   raloxifene (EVISTA) 60 MG tablet TAKE 1 TABLET(60 MG) BY MOUTH DAILY   Semaglutide,0.25 or 0.5MG /DOS, (OZEMPIC, 0.25 OR 0.5 MG/DOSE,) 2 MG/1.5ML SOPN Inject 0.25 mg into the skin once a week.   simvastatin (ZOCOR) 20 MG tablet TAKE 1 TABLET(20 MG) BY MOUTH DAILY   No facility-administered encounter medications on file as of 07/29/2020.    BP Readings from Last 3 Encounters:  06/26/20 (!) 153/53  05/12/20 140/82  02/05/20 132/60    Lab Results  Component Value Date   HGBA1C 8.0 (H) 06/21/2020     Patient obtains medications through Adherence Packaging  30 Days   Last  adherence delivery date: 07/07/2020, 30 DS Packs, 30 DS: Multivitamin (Women's One A Day) - 2 gummies daily (2 breakfast)  Amlodipine 10mg - 1 tablet daily (1 breakfast) Aspirin 81mg -1 tablet daily (1 evening meal)  Bupropion 300mg  24 hr-1 tablet daily (1 breakfast)  Calcium/Vitamin D (800 units Vit D/600mg  Calcium)- 1 tablet daily (1 evening meal)  Fluoxetine 40mg  - 1 capsule daily (1 breakfast)  Glipizide 10mg  XL - 1 tablet daily (1 breakfast)  Hydrochlorothiazide 12.5 mg- 1 tablet (1 breakfast) Losartan 50mg  - 1 tablet daily (1 breakfast) Metoprolol Succinate 25mg - 1 tablet daily (1 evening meal)  Raloxifene 60mg -1 tablet daily (1 breakfast)  Simvastatin 20mg -1 tablet daily (1 evening meal)  Patient declined the following medications last delivery: Ozempic Inj 2/1.5 ML inject 0.25 mg into skin once a week - Getting from patient assistance Pantoprazole 40 mg- 1 tablet daily- Daughter states it was only short term prescription.   Patient is due for next adherence delivery on: 08/06/2020  Spoke with patient on 07/29/2020 and reviewed medications and coordinated delivery.  This delivery to include: Adherence Packaging  30 Days  Packs, 30 DS: Multivitamin (Women's One A Day) - 2 gummies daily (2 breakfast)  Amlodipine 10mg - 1 tablet daily (1 breakfast) Aspirin 81mg -1 tablet daily (1 evening meal)  Bupropion 300mg  24 hr-1 tablet daily (1 breakfast)  Calcium/Vitamin D (800 units Vit D/600mg  Calcium)- 1 tablet daily (1 evening meal)  Fluoxetine  40mg  - 1 capsule daily (1 breakfast)  Glipizide 10mg  XL - 1 tablet daily (1 breakfast)  Hydrochlorothiazide 12.5 mg- 1 tablet (1 breakfast) Losartan 50mg  - 1 tablet daily (1 breakfast) Metoprolol Succinate 25mg - 1 tablet daily (1 evening meal)  Raloxifene 60mg -1 tablet daily (1 breakfast)  Simvastatin 20mg -1 tablet daily (1 evening meal)  PRN/VIAL medications:  None  Patient declined the following medications this month: Ozempic Inj 2/1.5 ML  inject 0.25 mg into skin once a week - Getting from patient assistance  Confirmed delivery date of  08/06/2020 advised patient that pharmacy will contact her the morning of delivery.  Recent blood pressure readings are as follows:  07/29/2020  137/73   Recent blood glucose readings are as follows:  07/29/2020  84 (fasting)  Star Rating Drugs:  Medication:  Last Fill: Day Supply Glipizide 10mg . 06/04/20  30ds Losartan 100-25mg . 06/04/20  30ds Ozempic 0.25.or 0.5 mg/dose            PAP Simvastatin 20mg . 06/04/20  30ds  Follow-Up:  Coordination of Enhanced Pharmacy Services and Pharmacist Review  10/06/2020, CPP notified  07/31/2020, [facility] Clinical Pharmacy Assistant [phone]  I have reviewed the care management and care coordination activities outlined in this encounter and I am certifying that I agree with the content of this note. No further action required.  08/04/20, PharmD Clinical Pharmacist [facility] at [facility] [phone]

## 2020-07-30 ENCOUNTER — Telehealth: Payer: Self-pay | Admitting: *Deleted

## 2020-07-30 DIAGNOSIS — N1832 Chronic kidney disease, stage 3b: Secondary | ICD-10-CM | POA: Diagnosis not present

## 2020-07-30 DIAGNOSIS — M810 Age-related osteoporosis without current pathological fracture: Secondary | ICD-10-CM | POA: Diagnosis not present

## 2020-07-30 DIAGNOSIS — N3001 Acute cystitis with hematuria: Secondary | ICD-10-CM | POA: Diagnosis not present

## 2020-07-30 DIAGNOSIS — E1165 Type 2 diabetes mellitus with hyperglycemia: Secondary | ICD-10-CM | POA: Diagnosis not present

## 2020-07-30 DIAGNOSIS — F418 Other specified anxiety disorders: Secondary | ICD-10-CM | POA: Diagnosis not present

## 2020-07-30 DIAGNOSIS — E1122 Type 2 diabetes mellitus with diabetic chronic kidney disease: Secondary | ICD-10-CM | POA: Diagnosis not present

## 2020-07-30 DIAGNOSIS — I129 Hypertensive chronic kidney disease with stage 1 through stage 4 chronic kidney disease, or unspecified chronic kidney disease: Secondary | ICD-10-CM | POA: Diagnosis not present

## 2020-07-30 DIAGNOSIS — E1151 Type 2 diabetes mellitus with diabetic peripheral angiopathy without gangrene: Secondary | ICD-10-CM | POA: Diagnosis not present

## 2020-07-30 DIAGNOSIS — E1169 Type 2 diabetes mellitus with other specified complication: Secondary | ICD-10-CM | POA: Diagnosis not present

## 2020-07-30 MED ORDER — BUPROPION HCL ER (XL) 300 MG PO TB24: ORAL_TABLET | ORAL | 1 refills | Status: DC

## 2020-07-30 MED ORDER — RALOXIFENE HCL 60 MG PO TABS: ORAL_TABLET | ORAL | 1 refills | Status: DC

## 2020-07-30 MED ORDER — SIMVASTATIN 20 MG PO TABS: ORAL_TABLET | ORAL | 1 refills | Status: DC

## 2020-07-30 NOTE — Progress Notes (Signed)
Refill requested from Dr. Milinda Antis for  Bupropion xl 300 mg Simvastatin 20 mg  Raloxifene 60 mg    Phil Dopp, CPP notified  Jomarie Longs, [facility] Clinical Pharmacy Assistant [phone]

## 2020-07-30 NOTE — Telephone Encounter (Signed)
-----   Message from Vanita Panda Sing sent at 07/30/2020 10:20 AM EDT ----- Regarding: Refill Refills needed for:  Bupropion 300mg  1tab q morning Raloxifene 60mg  1tab q morning Simvastatin 20mg  1tab.q evening  Please send to [facility] if refill is appropriate    , [facility] Clinical Pharmacy Assistant [phone]

## 2020-08-04 DIAGNOSIS — H2511 Age-related nuclear cataract, right eye: Secondary | ICD-10-CM | POA: Diagnosis not present

## 2020-08-04 DIAGNOSIS — H25812 Combined forms of age-related cataract, left eye: Secondary | ICD-10-CM | POA: Diagnosis not present

## 2020-08-04 DIAGNOSIS — H2512 Age-related nuclear cataract, left eye: Secondary | ICD-10-CM | POA: Diagnosis not present

## 2020-08-14 ENCOUNTER — Telehealth: Payer: Self-pay

## 2020-08-14 DIAGNOSIS — E1165 Type 2 diabetes mellitus with hyperglycemia: Secondary | ICD-10-CM | POA: Diagnosis not present

## 2020-08-14 DIAGNOSIS — E1122 Type 2 diabetes mellitus with diabetic chronic kidney disease: Secondary | ICD-10-CM | POA: Diagnosis not present

## 2020-08-14 DIAGNOSIS — I10 Essential (primary) hypertension: Secondary | ICD-10-CM | POA: Diagnosis not present

## 2020-08-14 DIAGNOSIS — E1151 Type 2 diabetes mellitus with diabetic peripheral angiopathy without gangrene: Secondary | ICD-10-CM | POA: Diagnosis not present

## 2020-08-14 DIAGNOSIS — F418 Other specified anxiety disorders: Secondary | ICD-10-CM | POA: Diagnosis not present

## 2020-08-14 DIAGNOSIS — R809 Proteinuria, unspecified: Secondary | ICD-10-CM | POA: Diagnosis not present

## 2020-08-14 DIAGNOSIS — I129 Hypertensive chronic kidney disease with stage 1 through stage 4 chronic kidney disease, or unspecified chronic kidney disease: Secondary | ICD-10-CM | POA: Diagnosis not present

## 2020-08-14 DIAGNOSIS — I509 Heart failure, unspecified: Secondary | ICD-10-CM | POA: Diagnosis not present

## 2020-08-14 DIAGNOSIS — N1832 Chronic kidney disease, stage 3b: Secondary | ICD-10-CM | POA: Diagnosis not present

## 2020-08-14 DIAGNOSIS — E1169 Type 2 diabetes mellitus with other specified complication: Secondary | ICD-10-CM | POA: Diagnosis not present

## 2020-08-14 DIAGNOSIS — N2581 Secondary hyperparathyroidism of renal origin: Secondary | ICD-10-CM | POA: Diagnosis not present

## 2020-08-14 DIAGNOSIS — N3001 Acute cystitis with hematuria: Secondary | ICD-10-CM | POA: Diagnosis not present

## 2020-08-14 DIAGNOSIS — M810 Age-related osteoporosis without current pathological fracture: Secondary | ICD-10-CM | POA: Diagnosis not present

## 2020-08-14 DIAGNOSIS — D631 Anemia in chronic kidney disease: Secondary | ICD-10-CM | POA: Diagnosis not present

## 2020-08-14 NOTE — Telephone Encounter (Signed)
Enrique Sack PT with [facility] left v/m requesting a referral for delivery of a 3 in 1 commode or bedside commode to [facility] ASAP to improve pts safety in bathroom.

## 2020-08-14 NOTE — Telephone Encounter (Signed)
Please ok that verbal order Let me know tomorrow if you need a px for the commode

## 2020-08-14 NOTE — Telephone Encounter (Signed)
Left VM for Amanda Benson asking if verbal order is fine or if we need to send an order in, I requested that she call me back

## 2020-08-15 DIAGNOSIS — M810 Age-related osteoporosis without current pathological fracture: Secondary | ICD-10-CM | POA: Diagnosis not present

## 2020-08-15 DIAGNOSIS — E1151 Type 2 diabetes mellitus with diabetic peripheral angiopathy without gangrene: Secondary | ICD-10-CM | POA: Diagnosis not present

## 2020-08-15 DIAGNOSIS — I129 Hypertensive chronic kidney disease with stage 1 through stage 4 chronic kidney disease, or unspecified chronic kidney disease: Secondary | ICD-10-CM | POA: Diagnosis not present

## 2020-08-15 DIAGNOSIS — E1169 Type 2 diabetes mellitus with other specified complication: Secondary | ICD-10-CM | POA: Diagnosis not present

## 2020-08-15 DIAGNOSIS — F418 Other specified anxiety disorders: Secondary | ICD-10-CM | POA: Diagnosis not present

## 2020-08-15 DIAGNOSIS — N1832 Chronic kidney disease, stage 3b: Secondary | ICD-10-CM | POA: Diagnosis not present

## 2020-08-15 DIAGNOSIS — E1165 Type 2 diabetes mellitus with hyperglycemia: Secondary | ICD-10-CM | POA: Diagnosis not present

## 2020-08-15 DIAGNOSIS — N3001 Acute cystitis with hematuria: Secondary | ICD-10-CM | POA: Diagnosis not present

## 2020-08-15 DIAGNOSIS — E1122 Type 2 diabetes mellitus with diabetic chronic kidney disease: Secondary | ICD-10-CM | POA: Diagnosis not present

## 2020-08-15 NOTE — Telephone Encounter (Signed)
Amanda Benson called back and said they will fax a form from them for the commode that we can fax directly back to [facility]. She did let me know once that is faxed to them, usually [facility] will fax a follow up form for PCP to sign so she may have to sign 2 forms in total but they will go ahead and fax the 1st form   FYI to PCP

## 2020-08-18 DIAGNOSIS — H2511 Age-related nuclear cataract, right eye: Secondary | ICD-10-CM | POA: Diagnosis not present

## 2020-08-18 DIAGNOSIS — H25811 Combined forms of age-related cataract, right eye: Secondary | ICD-10-CM | POA: Diagnosis not present

## 2020-08-18 DIAGNOSIS — H2512 Age-related nuclear cataract, left eye: Secondary | ICD-10-CM | POA: Diagnosis not present

## 2020-08-27 ENCOUNTER — Telehealth: Payer: Self-pay

## 2020-08-27 NOTE — Chronic Care Management (AMB) (Addendum)
Chronic Care Management Pharmacy Assistant   Name: Amanda Benson  MRN: [id number] DOB: 10-15-1935   Reason for Encounter: Medication Adherence and Delivery Coordination  Recent office visits:  None since last CCM contact  Recent consult visits:  08/14/20-Dr.Madhu Korrapati, Nephrology - No medication changes follow up 3 months  Hospital visits:  None in previous 6 months  Medications: Outpatient Encounter Medications as of 08/27/2020  Medication Sig   amLODipine (NORVASC) 10 MG tablet TAKE 1 TABLET(10 MG) BY MOUTH DAILY   aspirin 81 MG EC tablet Take 81 mg by mouth daily.   buPROPion (WELLBUTRIN XL) 300 MG 24 hr tablet TAKE 1 TABLET(300 MG) BY MOUTH DAILY   cholecalciferol (VITAMIN D3) 25 MCG (1000 UNIT) tablet Take 1,000 Units by mouth daily.   fluconazole (DIFLUCAN) 100 MG tablet Take one pill by mouth every other day for 3 doses   FLUoxetine (PROZAC) 40 MG capsule TAKE 1 CAPSULE(40 MG) BY MOUTH DAILY   glipiZIDE (GLUCOTROL XL) 10 MG 24 hr tablet TAKE 1 TABLET(10 MG) BY MOUTH DAILY   Insulin Pen Needle 29G X MISC Use with insulin daily   losartan-hydrochlorothiazide (HYZAAR) 100-25 MG tablet Take 0.5 tablets by mouth daily.   metoprolol succinate (TOPROL-XL) 25 MG 24 hr tablet TAKE 1 TABLET(25 MG) BY MOUTH DAILY   pantoprazole (PROTONIX) 40 MG tablet Take 1 tablet (40 mg total) by mouth daily.   raloxifene (EVISTA) 60 MG tablet TAKE 1 TABLET(60 MG) BY MOUTH DAILY   Semaglutide,0.25 or 0.5MG /DOS, (OZEMPIC, 0.25 OR 0.5 MG/DOSE,) 2 MG/1.5ML SOPN Inject 0.25 mg into the skin once a week.   simvastatin (ZOCOR) 20 MG tablet TAKE 1 TABLET(20 MG) BY MOUTH DAILY   No facility-administered encounter medications on file as of 08/27/2020.    BP Readings from Last 3 Encounters:  06/26/20 (!) 153/53  05/12/20 140/82  02/05/20 132/60    Lab Results  Component Value Date   HGBA1C 8.0 (H) 06/21/2020    Unsuccessful outreach to patient. Coordinated delivery based on last fill  dates.   Patient obtains medications through Adherence Packaging  30 Days   Last adherence delivery date:  08/06/2020   Patient is due for next adherence delivery on: 09/05/2020  This delivery to include: Adherence Packaging  30 Days   Packs: Amlodipine 10mg - 1 tablet daily (1 breakfast) Aspirin 81mg -1 tablet daily (1 evening meal)  Bupropion 300mg  24 hr-1 tablet daily (1 breakfast)  Calcium/Vitamin D (800 units Vit D/600mg  Calcium)- 1 tablet daily (1 evening meal)  Fluoxetine 40mg  - 1 capsule daily (1 breakfast)  Glipizide 10mg  XL - 1 tablet daily (1 breakfast)  Hydrochlorothiazide 12.5 mg- 1 tablet (1 breakfast) Losartan 50mg  - 1 tablet daily (1 breakfast) Metoprolol Succinate 25mg - 1 tablet daily (1 evening meal)  Raloxifene 60mg -1 tablet daily (1 breakfast)  Simvastatin 20mg -1 tablet daily (1 evening meal)  PRN/VIAL medications:  Multivitamin (Women's One A Day) - 2 gummies daily (2 breakfast)   Patient not due for the following medications this month: Ozempic Inj 2/1.5 ML inject 0.25 mg into skin once a week - Getting from patient assistance Pantoprazole 40 mg- 1 tablet daily- Daughter states it was only short term prescription.    No refill request needed from PCP.  Follow-Up:  Coordination of Enhanced Pharmacy Services and Pharmacist Review  , CPP notified  , [facility] Clinical Pharmacy Assistant [phone]  I have reviewed the care management and care coordination activities outlined in this encounter and I am certifying  that I agree with the content of this note. No further action required.  Phil Dopp, PharmD Clinical Pharmacist [facility] at [facility] [phone]

## 2020-09-04 ENCOUNTER — Emergency Department (HOSPITAL_COMMUNITY)
Admission: EM | Admit: 2020-09-04 | Discharge: 2020-09-04 | Disposition: A | Discharge status: Home / Self Care | Payer: Medicare HMO | Attending: Emergency Medicine | Admitting: Emergency Medicine

## 2020-09-04 ENCOUNTER — Emergency Department (HOSPITAL_COMMUNITY): Payer: Medicare HMO

## 2020-09-04 DIAGNOSIS — Z79899 Other long term (current) drug therapy: Secondary | ICD-10-CM | POA: Insufficient documentation

## 2020-09-04 DIAGNOSIS — E860 Dehydration: Secondary | ICD-10-CM | POA: Diagnosis not present

## 2020-09-04 DIAGNOSIS — E1122 Type 2 diabetes mellitus with diabetic chronic kidney disease: Secondary | ICD-10-CM | POA: Diagnosis not present

## 2020-09-04 DIAGNOSIS — Z7984 Long term (current) use of oral hypoglycemic drugs: Secondary | ICD-10-CM | POA: Diagnosis not present

## 2020-09-04 DIAGNOSIS — Z7982 Long term (current) use of aspirin: Secondary | ICD-10-CM | POA: Insufficient documentation

## 2020-09-04 DIAGNOSIS — E039 Hypothyroidism, unspecified: Secondary | ICD-10-CM | POA: Diagnosis not present

## 2020-09-04 DIAGNOSIS — N189 Chronic kidney disease, unspecified: Secondary | ICD-10-CM | POA: Diagnosis not present

## 2020-09-04 DIAGNOSIS — I959 Hypotension, unspecified: Secondary | ICD-10-CM | POA: Diagnosis not present

## 2020-09-04 DIAGNOSIS — I129 Hypertensive chronic kidney disease with stage 1 through stage 4 chronic kidney disease, or unspecified chronic kidney disease: Secondary | ICD-10-CM | POA: Insufficient documentation

## 2020-09-04 DIAGNOSIS — R55 Syncope and collapse: Secondary | ICD-10-CM | POA: Diagnosis not present

## 2020-09-04 DIAGNOSIS — Z20822 Contact with and (suspected) exposure to covid-19: Secondary | ICD-10-CM | POA: Insufficient documentation

## 2020-09-04 DIAGNOSIS — R4182 Altered mental status, unspecified: Secondary | ICD-10-CM | POA: Diagnosis not present

## 2020-09-04 DIAGNOSIS — I10 Essential (primary) hypertension: Secondary | ICD-10-CM | POA: Diagnosis not present

## 2020-09-04 DIAGNOSIS — I447 Left bundle-branch block, unspecified: Secondary | ICD-10-CM | POA: Diagnosis not present

## 2020-09-04 DIAGNOSIS — R531 Weakness: Secondary | ICD-10-CM | POA: Diagnosis not present

## 2020-09-04 DIAGNOSIS — N3000 Acute cystitis without hematuria: Secondary | ICD-10-CM | POA: Diagnosis not present

## 2020-09-04 DIAGNOSIS — R0902 Hypoxemia: Secondary | ICD-10-CM | POA: Diagnosis not present

## 2020-09-04 LAB — COMPREHENSIVE METABOLIC PANEL
ALT: 61 U/L — ABNORMAL HIGH (ref 0–44)
AST: 53 U/L — ABNORMAL HIGH (ref 15–41)
Albumin: 2.6 g/dL — ABNORMAL LOW (ref 3.5–5.0)
Alkaline Phosphatase: 71 U/L (ref 38–126)
Anion gap: 11 (ref 5–15)
BUN: 35 mg/dL — ABNORMAL HIGH (ref 8–23)
CO2: 27 mmol/L (ref 22–32)
Calcium: 11.4 mg/dL — ABNORMAL HIGH (ref 8.9–10.3)
Chloride: 95 mmol/L — ABNORMAL LOW (ref 98–111)
Creatinine, Ser: 1.76 mg/dL — ABNORMAL HIGH (ref 0.44–1.00)
GFR, Estimated: 28 mL/min — ABNORMAL LOW (ref >60)
Glucose, Bld: 248 mg/dL — ABNORMAL HIGH (ref 70–99)
Potassium: 4 mmol/L (ref 3.5–5.1)
Sodium: 133 mmol/L — ABNORMAL LOW (ref 135–145)
Total Bilirubin: 1 mg/dL (ref 0.3–1.2)
Total Protein: 6.3 g/dL — ABNORMAL LOW (ref 6.5–8.1)

## 2020-09-04 LAB — URINALYSIS, COMPLETE (UACMP) WITH MICROSCOPIC
Bacteria, UA: NONE SEEN
Bilirubin Urine: NEGATIVE
Glucose, UA: 150 mg/dL — AB
Ketones, ur: 5 mg/dL — AB
Nitrite: POSITIVE — AB
Protein, ur: 100 mg/dL — AB
Specific Gravity, Urine: 1.013 (ref 1.005–1.030)
WBC, UA: >50 WBC/hpf — ABNORMAL HIGH (ref 0–5)
pH: 5 (ref 5.0–8.0)

## 2020-09-04 LAB — CBC WITH DIFFERENTIAL/PLATELET
Abs Immature Granulocytes: 0.11 10*3/uL — ABNORMAL HIGH (ref 0.00–0.07)
Basophils Absolute: 0 10*3/uL (ref 0.0–0.1)
Basophils Relative: 0 %
Eosinophils Absolute: 0 10*3/uL (ref 0.0–0.5)
Eosinophils Relative: 0 %
HCT: 40.5 % (ref 36.0–46.0)
Hemoglobin: 13.2 g/dL (ref 12.0–15.0)
Immature Granulocytes: 1 %
Lymphocytes Relative: 8 %
Lymphs Abs: 1.5 10*3/uL (ref 0.7–4.0)
MCH: 28.6 pg (ref 26.0–34.0)
MCHC: 32.6 g/dL (ref 30.0–36.0)
MCV: 87.7 fL (ref 80.0–100.0)
Monocytes Absolute: 1.5 10*3/uL — ABNORMAL HIGH (ref 0.1–1.0)
Monocytes Relative: 7 %
Neutro Abs: 17 10*3/uL — ABNORMAL HIGH (ref 1.7–7.7)
Neutrophils Relative %: 84 %
Platelets: 448 10*3/uL — ABNORMAL HIGH (ref 150–400)
RBC: 4.62 MIL/uL (ref 3.87–5.11)
RDW: 13.5 % (ref 11.5–15.5)
WBC: 20.2 10*3/uL — ABNORMAL HIGH (ref 4.0–10.5)
nRBC: 0 % (ref 0.0–0.2)

## 2020-09-04 LAB — RESP PANEL BY RT-PCR (FLU A&B, COVID) ARPGX2
Influenza A by PCR: NEGATIVE
Influenza B by PCR: NEGATIVE
SARS Coronavirus 2 by RT PCR: NEGATIVE

## 2020-09-04 MED ORDER — CEPHALEXIN 500 MG PO CAPS: 500.0000 mg | ORAL_CAPSULE | Freq: Four times a day (QID) | ORAL | 0 refills | Status: DC

## 2020-09-04 MED ORDER — SODIUM CHLORIDE 0.9 % IV SOLN: INTRAVENOUS | Status: DC

## 2020-09-04 MED ORDER — SODIUM CHLORIDE 0.9 % IV BOLUS
1000.0000 mL | Freq: Once | INTRAVENOUS | Status: AC
  Administered 2020-09-04: 1000 mL via INTRAVENOUS

## 2020-09-04 MED ORDER — SODIUM CHLORIDE 0.9 % IV SOLN
1.0000 g | Freq: Once | INTRAVENOUS | Status: AC
  Administered 2020-09-04: 1 g via INTRAVENOUS
  Filled 2020-09-04: qty 10

## 2020-09-04 NOTE — ED Notes (Signed)
Daughter states she is going to sit in her car. To call her when pt is being discharged and she will take pt home.

## 2020-09-04 NOTE — ED Notes (Signed)
Patient and family member verbalizes understanding of discharge instructions. Prescriptions reviewed. Opportunity for questioning and answers were provided. Armband removed by staff, pt discharged from ED via wheelchair.

## 2020-09-04 NOTE — ED Notes (Signed)
Female external catheter placed on pt.

## 2020-09-04 NOTE — ED Notes (Signed)
Pt in CT at this time.

## 2020-09-04 NOTE — ED Triage Notes (Signed)
Coming from home. 1 week of generalized weakness. Also complaining of incontinence, not new. Hx of multiple UTIs in the past. Sitting on chair at home. Pt had a near syncope episode with systolic of 80s. Bp 144/58 per EMS with HR of 66. Alert and oriented x 4.

## 2020-09-04 NOTE — ED Provider Notes (Signed)
[facility] EMERGENCY DEPARTMENT Provider Note   CSN: [id number] Arrival date & time: 09/04/20  1838     History Chief Complaint  Patient presents with  . Weakness    Amanda Benson is a 85 y.o. female.  Pt presents to the ED today with generalized weakness.  Pt has not been feeling well for a week.  She had a near syncopal event today at home.  Family checked bp and it was 80 systolic.  BP nl for EMS.  Pt said she has not been eating or drinking much the last few days.  She has had some diarrhea.  She gets frequent UTIs.        Past Medical History:  Diagnosis Date  . Depression   . Diabetes mellitus    type II  . Hyperlipidemia   . Hypertension   . Obesity   . Osteoporosis     Patient Active Problem List   Diagnosis Date Noted  . Hypophosphatemia   . Hypomagnesemia   . Hypokalemia   . B12 deficiency   . Sepsis (HCC) 06/21/2020  . Acute lower UTI 06/21/2020  . Acute metabolic encephalopathy 06/21/2020  . Hyponatremia 06/21/2020  . H/O sepsis 12/09/2019  . CKD (chronic kidney disease) 11/23/2019  . Leukocytosis 11/23/2019  . Mobility impaired 10/13/2019  . Incontinence of urine 10/12/2019  . Grief reaction 03/27/2019  . Elevated serum creatinine 06/13/2018  . Dysuria 06/13/2018  . Hearing loss 01/02/2018  . Subclinical hypothyroidism 10/26/2017  . Anxiety and depression 08/24/2016  . Falls frequently 08/24/2016  . Weakness 08/24/2016  . Routine general medical examination at a health care facility 05/16/2015  . Breast calcifications on mammogram 03/01/2014  . Encounter for Medicare annual wellness exam 05/30/2013  . Breast calcification seen on mammogram 12/14/2012  . Breast calcification, left 11/28/2012  . History of falling 11/27/2012  . Poor balance 11/27/2012  . Colon cancer screening 05/10/2012  . Osteoporosis 06/21/2007  . DM (diabetes mellitus), type 2, uncontrolled (HCC) 09/20/2006  . Type 2 diabetes mellitus with  hyperlipidemia (HCC) 09/20/2006  . Essential hypertension 09/20/2006  . Osteoarthritis 09/20/2006    Past Surgical History:  Procedure Laterality Date  . ANKLE SURGERY     left medial malleblus fracture  . CHOLECYSTECTOMY       OB History   No obstetric history on file.     Family History  Problem Relation Age of Onset  . Diabetes Mother   . Hypertension Mother   . Asthma Father   . Hypertension Sister   . Cancer Sister        lung  . Asthma Brother   . COPD Brother   . Cancer Maternal Grandmother        breast   . Asthma Brother   . Diabetes Mellitus II Daughter   . COPD Daughter     Social History   Tobacco Use  . Smoking status: Never Smoker  . Smokeless tobacco: Never Used  Vaping Use  . Vaping Use: Never used  Substance Use Topics  . Alcohol use: No    Alcohol/week: 0.0 standard drinks  . Drug use: No    Home Medications Prior to Admission medications   Medication Sig Start Date End Date Taking? Authorizing Provider  cephALEXin (KEFLEX) 500 MG capsule Take 1 capsule (500 mg total) by mouth 4 (four) times daily. 09/04/20  Yes Jacalyn Lefevre, MD  amLODipine (NORVASC) 10 MG tablet TAKE 1 TABLET(10 MG) BY MOUTH DAILY 07/01/20  Tower, Audrie Gallus, MD  aspirin 81 MG EC tablet Take 81 mg by mouth daily.    [provider]  buPROPion (WELLBUTRIN XL) 300 MG 24 hr tablet TAKE 1 TABLET(300 MG) BY MOUTH DAILY 07/30/20   Tower, Audrie Gallus, MD  cholecalciferol (VITAMIN D3) 25 MCG (1000 UNIT) tablet Take 1,000 Units by mouth daily.    [provider]  fluconazole (DIFLUCAN) 100 MG tablet Take one pill by mouth every other day for 3 doses 07/07/20   Tower, [place] A, MD  FLUoxetine (PROZAC) 40 MG capsule TAKE 1 CAPSULE(40 MG) BY MOUTH DAILY 07/01/20   Tower, Idamae Schuller A, MD  glipiZIDE (GLUCOTROL XL) 10 MG 24 hr tablet TAKE 1 TABLET(10 MG) BY MOUTH DAILY 07/01/20   Tower, Audrie Gallus, MD  Insulin Pen Needle 29G X MISC Use with insulin daily 01/22/20   Tower, Audrie Gallus, MD   losartan-hydrochlorothiazide (HYZAAR) 100-25 MG tablet Take 0.5 tablets by mouth daily. 04/11/20   Tower, Audrie Gallus, MD  metoprolol succinate (TOPROL-XL) 25 MG 24 hr tablet TAKE 1 TABLET(25 MG) BY MOUTH DAILY 07/01/20   Tower, Audrie Gallus, MD  pantoprazole (PROTONIX) 40 MG tablet Take 1 tablet (40 mg total) by mouth daily. 06/26/20 07/26/20  Tresa Moore, MD  raloxifene (EVISTA) 60 MG tablet TAKE 1 TABLET(60 MG) BY MOUTH DAILY 07/30/20   Tower, Audrie Gallus, MD  Semaglutide,0.25 or 0.5MG /DOS, (OZEMPIC, 0.25 OR 0.5 MG/DOSE,) 2 MG/1.5ML SOPN Inject 0.25 mg into the skin once a week. 02/11/20   Tower, Audrie Gallus, MD  simvastatin (ZOCOR) 20 MG tablet TAKE 1 TABLET(20 MG) BY MOUTH DAILY 07/30/20   Tower, Audrie Gallus, MD    Allergies    Alendronate sodium  Review of Systems   Review of Systems  Constitutional: Positive for appetite change.  Gastrointestinal: Positive for diarrhea.  Neurological: Positive for weakness.  All other systems reviewed and are negative.   Physical Exam Updated Vital Signs BP (!) 153/56   Pulse 73   Temp 98 F (36.7 C) (Oral)   Resp 16   Ht 5\' 5"  (1.651 m)   Wt 50.3 kg   SpO2 97%   BMI 18.47 kg/m   Physical Exam Vitals and nursing note reviewed.  Constitutional:      Appearance: Normal appearance.  HENT:     Head: Normocephalic and atraumatic.     Right Ear: External ear normal.     Left Ear: External ear normal.     Nose: Nose normal.     Mouth/Throat:     Mouth: Mucous membranes are dry.     Pharynx: Oropharynx is clear.  Eyes:     Extraocular Movements: Extraocular movements intact.     Conjunctiva/sclera: Conjunctivae normal.     Pupils: Pupils are equal, round, and reactive to light.  Cardiovascular:     Rate and Rhythm: Normal rate and regular rhythm.     Pulses: Normal pulses.     Heart sounds: Normal heart sounds.  Pulmonary:     Effort: Pulmonary effort is normal.     Breath sounds: Normal breath sounds.  Abdominal:     General: Abdomen is flat.  Bowel sounds are normal.     Palpations: Abdomen is soft.  Musculoskeletal:        General: Normal range of motion.     Cervical back: Normal range of motion and neck supple.  Skin:    General: Skin is warm.     Capillary Refill: Capillary refill takes less  than 2 seconds.  Neurological:     General: No focal deficit present.     Mental Status: She is alert and oriented to person, place, and time.  Psychiatric:        Mood and Affect: Mood normal.        Behavior: Behavior normal.        Thought Content: Thought content normal.        Judgment: Judgment normal.     ED Results / Procedures / Treatments   Labs (all labs ordered are listed, but only abnormal results are displayed) Labs Reviewed  COMPREHENSIVE METABOLIC PANEL - Abnormal; Notable for the following components:      Result Value   Sodium 133 (*)    Chloride 95 (*)    Glucose, Bld 248 (*)    BUN 35 (*)    Creatinine, Ser 1.76 (*)    Calcium 11.4 (*)    Total Protein 6.3 (*)    Albumin 2.6 (*)    AST 53 (*)    ALT 61 (*)    GFR, Estimated 28 (*)    All other components within normal limits  CBC WITH DIFFERENTIAL/PLATELET - Abnormal; Notable for the following components:   WBC 20.2 (*)    Platelets 448 (*)    Neutro Abs 17.0 (*)    Monocytes Absolute 1.5 (*)    Abs Immature Granulocytes 0.11 (*)    All other components within normal limits  URINALYSIS, COMPLETE (UACMP) WITH MICROSCOPIC - Abnormal; Notable for the following components:   APPearance TURBID (*)    Glucose, UA 150 (*)    Hgb urine dipstick SMALL (*)    Ketones, ur 5 (*)    Protein, ur 100 (*)    Nitrite POSITIVE (*)    Leukocytes,Ua LARGE (*)    WBC, UA >50 (*)    All other components within normal limits  RESP PANEL BY RT-PCR (FLU A&B, COVID) [id number]  CBG MONITORING, ED    EKG EKG Interpretation  Date/Time:  Thursday September 04 2020 18:43:58 EDT Ventricular Rate:  68 PR Interval:  179 QRS Duration: 135 QT Interval:  472 QTC  Calculation: 502 R Axis:   -38 Text Interpretation: Ectopic atrial rhythm Left bundle branch block No significant change since last tracing Confirmed by Jacalyn LefevreHaviland, Zafir Schauer [phone]([id number]) on 09/04/2020 7:46:22 PM   Radiology CT HEAD WO CONTRAST  Result Date: 09/04/2020 CLINICAL DATA:  Mental status changes. EXAM: CT HEAD WITHOUT CONTRAST TECHNIQUE: Contiguous axial images were obtained from the base of the skull through the vertex without intravenous contrast. COMPARISON:  06/21/2020 FINDINGS: Brain: There is no evidence for acute hemorrhage, hydrocephalus, mass lesion, or abnormal extra-axial fluid collection. No definite CT evidence for acute infarction. Age indeterminate lacunar infarct in the left basal ganglia is new in the interval since prior study. Diffuse loss of parenchymal volume is consistent with atrophy. Patchy low attenuation in the deep hemispheric and periventricular white matter is nonspecific, but likely reflects chronic microvascular ischemic demyelination. Vascular: No hyperdense vessel or unexpected calcification. Skull: No evidence for fracture. No worrisome lytic or sclerotic lesion. Sinuses/Orbits: The visualized paranasal sinuses and mastoid air cells are clear. Visualized portions of the globes and intraorbital fat are unremarkable. Other: None. IMPRESSION: 1. No acute intracranial abnormality. 2. Atrophy with chronic small vessel white matter ischemic disease. 3. Age indeterminate lacunar infarct in the left basal ganglia, but new in the interval since prior study. Electronically Signed   By: Jamison OkaEric  Mansell M.D.  On: 09/04/2020 19:28   DG Chest Port 1 View  Result Date: 09/04/2020 CLINICAL DATA:  Weakness and altered level of consciousness EXAM: PORTABLE CHEST 1 VIEW COMPARISON:  06/21/2020 FINDINGS: Cardiac shadow is mildly enlarged but stable. Aortic calcifications are seen. The lungs are well aerated bilaterally. No focal infiltrate or sizable effusion is seen. No bony abnormality is  noted. IMPRESSION: No acute abnormality noted. Aortic Atherosclerosis (ICD10-I70.0). Electronically Signed   By: Alcide Clever M.D.   On: 09/04/2020 18:57    Procedures Procedures   Medications Ordered in ED Medications  sodium chloride 0.9 % bolus 1,000 mL (0 mLs Intravenous Stopped 09/04/20 2017)    And  0.9 %  sodium chloride infusion ( Intravenous New Bag/Given 09/04/20 2024)  cefTRIAXone (ROCEPHIN) 1 g in sodium chloride 0.9 % 100 mL IVPB (has no administration in time range)    ED Course  I have reviewed the triage vital signs and the nursing notes.  Pertinent labs & imaging results that were available during my care of the patient were reviewed by me and considered in my medical decision making (see chart for details).    MDM Rules/Calculators/A&P                          Pt is feeling much better after fluids.  BPs have been fine here.  She does have a UTI.  She is offered admission due to hypotension earlier + UTI, but she feels well and wants to go home.  Her daughter is with her and is good with plan.  Pt knows that she should return if she feels worse.    Pt is given a dose of rocephin in ED.  She is d/c with keflex.  F/u with pcp. Final Clinical Impression(s) / ED Diagnoses Final diagnoses:  Dehydration  Acute cystitis without hematuria    Rx / DC Orders ED Discharge Orders         Ordered    cephALEXin (KEFLEX) 500 MG capsule  4 times daily        09/04/20 2308           Jacalyn Lefevre, MD 09/04/20 2311

## 2020-09-05 ENCOUNTER — Telehealth: Payer: Self-pay

## 2020-09-05 NOTE — Telephone Encounter (Signed)
Not in the office today so will route to pharmacist who has been helping pt with this

## 2020-09-05 NOTE — Telephone Encounter (Signed)
[employer] shipment of Ozempic received two boxes. Boxes placed in frig with packing list.

## 2020-09-08 NOTE — Chronic Care Management (AMB) (Signed)
Contacted patients daughter, [name], to advise Ozempic is ready to be picked up at [facility]. She denied any questions or concerns at this time  Phil Dopp, CPP notified  Jomarie Longs, [facility] Clinical Pharmacy Assistant [phone]

## 2020-09-12 ENCOUNTER — Encounter: Payer: Self-pay | Admitting: Family Medicine

## 2020-09-12 ENCOUNTER — Ambulatory Visit (INDEPENDENT_AMBULATORY_CARE_PROVIDER_SITE_OTHER): Payer: Medicare HMO | Admitting: Family Medicine

## 2020-09-12 ENCOUNTER — Other Ambulatory Visit: Payer: Self-pay

## 2020-09-12 VITALS — BP 138/60 | HR 54 | Temp 97.0°F | Ht 62.5 in

## 2020-09-12 DIAGNOSIS — N1832 Chronic kidney disease, stage 3b: Secondary | ICD-10-CM | POA: Diagnosis not present

## 2020-09-12 DIAGNOSIS — I10 Essential (primary) hypertension: Secondary | ICD-10-CM

## 2020-09-12 DIAGNOSIS — E1165 Type 2 diabetes mellitus with hyperglycemia: Secondary | ICD-10-CM | POA: Diagnosis not present

## 2020-09-12 DIAGNOSIS — N390 Urinary tract infection, site not specified: Secondary | ICD-10-CM | POA: Diagnosis not present

## 2020-09-12 LAB — COMPREHENSIVE METABOLIC PANEL
ALT: 42 U/L — ABNORMAL HIGH (ref 0–35)
AST: 46 U/L — ABNORMAL HIGH (ref 0–37)
Albumin: 3.1 g/dL — ABNORMAL LOW (ref 3.5–5.2)
Alkaline Phosphatase: 58 U/L (ref 39–117)
BUN: 23 mg/dL (ref 6–23)
CO2: 33 mEq/L — ABNORMAL HIGH (ref 19–32)
Calcium: 10.7 mg/dL — ABNORMAL HIGH (ref 8.4–10.5)
Chloride: 99 mEq/L (ref 96–112)
Creatinine, Ser: 1.25 mg/dL — ABNORMAL HIGH (ref 0.40–1.20)
GFR: 39.95 mL/min — ABNORMAL LOW (ref >60.00)
Glucose, Bld: 182 mg/dL — ABNORMAL HIGH (ref 70–99)
Potassium: 3.7 mEq/L (ref 3.5–5.1)
Sodium: 138 mEq/L (ref 135–145)
Total Bilirubin: 0.4 mg/dL (ref 0.2–1.2)
Total Protein: 6.4 g/dL (ref 6.0–8.3)

## 2020-09-12 LAB — CBC WITH DIFFERENTIAL/PLATELET
Basophils Absolute: 0.1 10*3/uL (ref 0.0–0.1)
Basophils Relative: 0.4 % (ref 0.0–3.0)
Eosinophils Absolute: 0.2 10*3/uL (ref 0.0–0.7)
Eosinophils Relative: 1.4 % (ref 0.0–5.0)
HCT: 35.3 % — ABNORMAL LOW (ref 36.0–46.0)
Hemoglobin: 11.7 g/dL — ABNORMAL LOW (ref 12.0–15.0)
Lymphocytes Relative: 12.2 % (ref 12.0–46.0)
Lymphs Abs: 1.6 10*3/uL (ref 0.7–4.0)
MCHC: 33.1 g/dL (ref 30.0–36.0)
MCV: 84.6 fl (ref 78.0–100.0)
Monocytes Absolute: 0.7 10*3/uL (ref 0.1–1.0)
Monocytes Relative: 5.1 % (ref 3.0–12.0)
Neutro Abs: 10.3 10*3/uL — ABNORMAL HIGH (ref 1.4–7.7)
Neutrophils Relative %: 80.9 % — ABNORMAL HIGH (ref 43.0–77.0)
Platelets: 492 10*3/uL — ABNORMAL HIGH (ref 150.0–400.0)
RBC: 4.18 Mil/uL (ref 3.87–5.11)
RDW: 14.4 % (ref 11.5–15.5)
WBC: 12.8 10*3/uL — ABNORMAL HIGH (ref 4.0–10.5)

## 2020-09-12 NOTE — Patient Instructions (Addendum)
Aim for 64 oz of fluids per day - water and calorie free drinks  Let me know when/if you home physical therapy referral  Keep me posted  We need to get you stronger   Labs today   Continue current medicines

## 2020-09-12 NOTE — Progress Notes (Signed)
Subjective:    Patient ID: Amanda Benson, female    DOB: 11-05-1935, 85 y.o.   MRN: [id number]  This visit occurred during the SARS-CoV-2 public health emergency.  Safety protocols were in place, including screening questions prior to the visit, additional usage of staff PPE, and extensive cleaning of exam room while observing appropriate contact time as indicated for disinfecting solutions.   HPI Pt presents for f/u of ER visit on 6/2 for weakness and uti   Wt Readings from Last 3 Encounters:  09/04/20 111 lb (50.3 kg)  06/21/20 132 lb 4.4 oz (60 kg)  05/12/20 142 lb 3 oz (64.5 kg)   19.98 kg/m   She presented with gen weakness /fatigue and dehydration - with pre syncopal episode Diagnosed with uti  Given dose of rocephin in ER and px keflex 500 mg qid  Labs as follows  Results for orders placed or performed during the hospital encounter of 09/04/20  Resp Panel by RT-PCR (Flu A&B, Covid) Nasopharyngeal Swab   Specimen: Nasopharyngeal Swab; Nasopharyngeal(NP) swabs in vial transport medium  Result Value Ref Range   SARS Coronavirus 2 by RT PCR NEGATIVE NEGATIVE   Influenza A by PCR NEGATIVE NEGATIVE   Influenza B by PCR NEGATIVE NEGATIVE  Comprehensive metabolic panel  Result Value Ref Range   Sodium 133 (L) 135 - 145 mmol/L   Potassium 4.0 3.5 - 5.1 mmol/L   Chloride 95 (L) 98 - 111 mmol/L   CO2 27 22 - 32 mmol/L   Glucose, Bld 248 (H) 70 - 99 mg/dL   BUN 35 (H) 8 - 23 mg/dL   Creatinine, Ser 9.38 (H) 0.44 - 1.00 mg/dL   Calcium 10.1 (H) 8.9 - 10.3 mg/dL   Total Protein 6.3 (L) 6.5 - 8.1 g/dL   Albumin 2.6 (L) 3.5 - 5.0 g/dL   AST 53 (H) 15 - 41 U/L   ALT 61 (H) 0 - 44 U/L   Alkaline Phosphatase 71 38 - 126 U/L   Total Bilirubin 1.0 0.3 - 1.2 mg/dL   GFR, Estimated 28 (L) >60 mL/min   Anion gap 11 5 - 15  CBC WITH DIFFERENTIAL  Result Value Ref Range   WBC 20.2 (H) 4.0 - 10.5 K/uL   RBC 4.62 3.87 - 5.11 MIL/uL   Hemoglobin 13.2 12.0 - 15.0 g/dL   HCT 75.1 02.5  - 85.2 %   MCV 87.7 80.0 - 100.0 fL   MCH 28.6 26.0 - 34.0 pg   MCHC 32.6 30.0 - 36.0 g/dL   RDW 77.8 24.2 - 35.3 %   Platelets 448 (H) 150 - 400 K/uL   nRBC 0.0 0.0 - 0.2 %   Neutrophils Relative % 84 %   Neutro Abs 17.0 (H) 1.7 - 7.7 K/uL   Lymphocytes Relative 8 %   Lymphs Abs 1.5 0.7 - 4.0 K/uL   Monocytes Relative 7 %   Monocytes Absolute 1.5 (H) 0.1 - 1.0 K/uL   Eosinophils Relative 0 %   Eosinophils Absolute 0.0 0.0 - 0.5 K/uL   Basophils Relative 0 %   Basophils Absolute 0.0 0.0 - 0.1 K/uL   Immature Granulocytes 1 %   Abs Immature Granulocytes 0.11 (H) 0.00 - 0.07 K/uL  Urinalysis, Complete w Microscopic  Result Value Ref Range   Color, Urine YELLOW YELLOW   APPearance TURBID (A) CLEAR   Specific Gravity, Urine 1.013 1.005 - 1.030   pH 5.0 5.0 - 8.0   Glucose, UA 150 (A) NEGATIVE  mg/dL   Hgb urine dipstick SMALL (A) NEGATIVE   Bilirubin Urine NEGATIVE NEGATIVE   Ketones, ur 5 (A) NEGATIVE mg/dL   Protein, ur 782 (A) NEGATIVE mg/dL   Nitrite POSITIVE (A) NEGATIVE   Leukocytes,Ua LARGE (A) NEGATIVE   RBC / HPF 6-10 0 - 5 RBC/hpf   WBC, UA >50 (H) 0 - 5 WBC/hpf   Bacteria, UA NONE SEEN NONE SEEN   Squamous Epithelial / LPF 0-5 0 - 5   WBC Clumps PRESENT    Imaging CT HEAD WO CONTRAST  Result Date: 09/04/2020 CLINICAL DATA:  Mental status changes. EXAM: CT HEAD WITHOUT CONTRAST TECHNIQUE: Contiguous axial images were obtained from the base of the skull through the vertex without intravenous contrast. COMPARISON:  06/21/2020 FINDINGS: Brain: There is no evidence for acute hemorrhage, hydrocephalus, mass lesion, or abnormal extra-axial fluid collection. No definite CT evidence for acute infarction. Age indeterminate lacunar infarct in the left basal ganglia is new in the interval since prior study. Diffuse loss of parenchymal volume is consistent with atrophy. Patchy low attenuation in the deep hemispheric and periventricular white matter is nonspecific, but likely reflects  chronic microvascular ischemic demyelination. Vascular: No hyperdense vessel or unexpected calcification. Skull: No evidence for fracture. No worrisome lytic or sclerotic lesion. Sinuses/Orbits: The visualized paranasal sinuses and mastoid air cells are clear. Visualized portions of the globes and intraorbital fat are unremarkable. Other: None. IMPRESSION: 1. No acute intracranial abnormality. 2. Atrophy with chronic small vessel white matter ischemic disease. 3. Age indeterminate lacunar infarct in the left basal ganglia, but new in the interval since prior study. Electronically Signed   By: Kennith Center M.D.   On: 09/04/2020 19:28   DG Chest Port 1 View  Result Date: 09/04/2020 CLINICAL DATA:  Weakness and altered level of consciousness EXAM: PORTABLE CHEST 1 VIEW COMPARISON:  06/21/2020 FINDINGS: Cardiac shadow is mildly enlarged but stable. Aortic calcifications are seen. The lungs are well aerated bilaterally. No focal infiltrate or sizable effusion is seen. No bony abnormality is noted. IMPRESSION: No acute abnormality noted. Aortic Atherosclerosis (ICD10-I70.0). Electronically Signed   By: Alcide Clever M.D.   On: 09/04/2020 18:57    HTN BP Readings from Last 3 Encounters:  09/12/20 (!) 146/62  09/04/20 (!) 156/58  06/26/20 (!) 153/53   Pulse Readings from Last 3 Encounters:  09/12/20 (!) 54  09/04/20 67  06/26/20 71  Losartan hct 50-12.5 mg daily  Metoprolol xl 25 mg daily  Amlodipine 10 mg daily   Cr up significantly with uti as expected in setting of baseline renal insuff Fluid intake - daughter is pushing it  Sugar free drinks   Lab Results  Component Value Date   HGBA1C 8.0 (H) 06/21/2020   Taking semaglutide 0.25 mg weekly -really working  Mostly below 150  Glucose readings are down !  Happy about that  Glipizide xl 10 mg daily   Starting to feel a little better  Very weak and not walking at all (not even stand on her own) Family is assist   Patient Active Problem  List   Diagnosis Date Noted   Hypophosphatemia    Hypomagnesemia    Hypokalemia    B12 deficiency    Sepsis (HCC) 06/21/2020   Acute lower UTI 06/21/2020   Acute metabolic encephalopathy 06/21/2020   Hyponatremia 06/21/2020   H/O sepsis 12/09/2019   CKD (chronic kidney disease) 11/23/2019   Leukocytosis 11/23/2019   Mobility impaired 10/13/2019   Incontinence of urine 10/12/2019  Grief reaction 03/27/2019   Elevated serum creatinine 06/13/2018   Dysuria 06/13/2018   Hearing loss 01/02/2018   Subclinical hypothyroidism 10/26/2017   Anxiety and depression 08/24/2016   Falls frequently 08/24/2016   Weakness 08/24/2016   Routine general medical examination at a health care facility 05/16/2015   Breast calcifications on mammogram 03/01/2014   Encounter for Medicare annual wellness exam 05/30/2013   Breast calcification seen on mammogram 12/14/2012   Breast calcification, left 11/28/2012   History of falling 11/27/2012   Poor balance 11/27/2012   Colon cancer screening 05/10/2012   Osteoporosis 06/21/2007   DM (diabetes mellitus), type 2, uncontrolled (HCC) 09/20/2006   Type 2 diabetes mellitus with hyperlipidemia (HCC) 09/20/2006   Essential hypertension 09/20/2006   Osteoarthritis 09/20/2006   Past Medical History:  Diagnosis Date   Depression    Diabetes mellitus    type II   Hyperlipidemia    Hypertension    Obesity    Osteoporosis    Past Surgical History:  Procedure Laterality Date   ANKLE SURGERY     left medial malleblus fracture   CHOLECYSTECTOMY     Social History   Tobacco Use   Smoking status: Never   Smokeless tobacco: Never  Vaping Use   Vaping Use: Never used  Substance Use Topics   Alcohol use: No    Alcohol/week: 0.0 standard drinks   Drug use: No   Family History  Problem Relation Age of Onset   Diabetes Mother    Hypertension Mother    Asthma Father    Hypertension Sister    Cancer Sister        lung   Asthma Brother    COPD  Brother    Cancer Maternal Grandmother        breast    Asthma Brother    Diabetes Mellitus II Daughter    COPD Daughter    Allergies  Allergen Reactions   Alendronate Sodium     REACTION: heartburn   Current Outpatient Medications on File Prior to Visit  Medication Sig Dispense Refill   amLODipine (NORVASC) 10 MG tablet TAKE 1 TABLET(10 MG) BY MOUTH DAILY 90 tablet 1   aspirin 81 MG EC tablet Take 81 mg by mouth daily.     buPROPion (WELLBUTRIN XL) 300 MG 24 hr tablet TAKE 1 TABLET(300 MG) BY MOUTH DAILY 90 tablet 1   cephALEXin (KEFLEX) 500 MG capsule Take 1 capsule (500 mg total) by mouth 4 (four) times daily. 28 capsule 0   cholecalciferol (VITAMIN D3) 25 MCG (1000 UNIT) tablet Take 1,000 Units by mouth daily.     fluconazole (DIFLUCAN) 100 MG tablet Take one pill by mouth every other day for 3 doses 3 tablet 0   FLUoxetine (PROZAC) 40 MG capsule TAKE 1 CAPSULE(40 MG) BY MOUTH DAILY 90 capsule 1   glipiZIDE (GLUCOTROL XL) 10 MG 24 hr tablet TAKE 1 TABLET(10 MG) BY MOUTH DAILY 90 tablet 1   Insulin Pen Needle 29G X 12MM MISC Use with insulin daily 90 each 0   losartan-hydrochlorothiazide (HYZAAR) 100-25 MG tablet Take 0.5 tablets by mouth daily. 45 tablet 3   metoprolol succinate (TOPROL-XL) 25 MG 24 hr tablet TAKE 1 TABLET(25 MG) BY MOUTH DAILY 90 tablet 1   raloxifene (EVISTA) 60 MG tablet TAKE 1 TABLET(60 MG) BY MOUTH DAILY 90 tablet 1   Semaglutide,0.25 or 0.5MG /DOS, (OZEMPIC, 0.25 OR 0.5 MG/DOSE,) 2 MG/1.5ML SOPN Inject 0.25 mg into the skin once a week. 1.5 mL 3  simvastatin (ZOCOR) 20 MG tablet TAKE 1 TABLET(20 MG) BY MOUTH DAILY 90 tablet 1   pantoprazole (PROTONIX) 40 MG tablet Take 1 tablet (40 mg total) by mouth daily. 30 tablet 0   No current facility-administered medications on file prior to visit.    Review of Systems  Constitutional:  Positive for fatigue. Negative for activity change, appetite change, fever and unexpected weight change.  HENT:  Negative for  congestion, rhinorrhea, sore throat and trouble swallowing.   Eyes:  Negative for pain, redness, itching and visual disturbance.  Respiratory:  Negative for cough, chest tightness, shortness of breath and wheezing.   Cardiovascular:  Negative for chest pain and palpitations.  Gastrointestinal:  Negative for abdominal pain, blood in stool, constipation, diarrhea and nausea.  Endocrine: Negative for cold intolerance, heat intolerance, polydipsia and polyuria.  Genitourinary:  Negative for difficulty urinating, dysuria, flank pain, frequency and urgency.       Baseline frequency  Musculoskeletal:  Negative for arthralgias, joint swelling and myalgias.  Skin:  Negative for pallor and rash.  Neurological:  Negative for dizziness, tremors, weakness, numbness and headaches.  Hematological:  Negative for adenopathy. Does not bruise/bleed easily.  Psychiatric/Behavioral:  Positive for dysphoric mood. Negative for decreased concentration. The patient is not nervous/anxious.        Low motivation  Does not feel like walking  No desire to be stronger or more independent  Declines tx      Objective:   Physical Exam Constitutional:      General: She is not in acute distress.    Appearance: Normal appearance. She is well-developed and normal weight. She is not ill-appearing or diaphoretic.  HENT:     Head: Normocephalic and atraumatic.  Eyes:     Conjunctiva/sclera: Conjunctivae normal.     Pupils: Pupils are equal, round, and reactive to light.  Neck:     Thyroid: No thyromegaly.     Vascular: No carotid bruit or JVD.  Cardiovascular:     Rate and Rhythm: Normal rate and regular rhythm.     Heart sounds: Normal heart sounds.    No gallop.  Pulmonary:     Effort: Pulmonary effort is normal. No respiratory distress.     Breath sounds: Normal breath sounds. No wheezing or rales.  Abdominal:     General: Bowel sounds are normal. There is no distension or abdominal bruit.     Palpations:  Abdomen is soft. There is no mass.     Tenderness: There is no abdominal tenderness.     Comments: No suprapubic tenderness or fullness    Musculoskeletal:     Cervical back: Normal range of motion and neck supple.     Right lower leg: No edema.     Left lower leg: No edema.  Lymphadenopathy:     Cervical: No cervical adenopathy.  Skin:    General: Skin is warm and dry.     Coloration: Skin is not pale.     Findings: No erythema or rash.  Neurological:     Mental Status: She is alert.     Coordination: Coordination normal.     Deep Tendon Reflexes: Reflexes are normal and symmetric. Reflexes normal.  Psychiatric:        Mood and Affect: Mood is depressed.          Assessment & Plan:   Problem List Items Addressed This Visit       Cardiovascular and Mediastinum   Essential hypertension    Stable  bp /difficult to control  bp in fair control at this time  BP Readings from Last 1 Encounters:  09/12/20 138/60   No changes needed Most recent labs reviewed  Disc lifstyle change with low sodium diet and exercise  Plan to continue  Losartan hct 50-12.5 mg daily  Metoprolol xl 25 mg daily  Amlodipine 10 mg daily        Relevant Orders   Comprehensive metabolic panel (Completed)   CBC with Differential/Platelet (Completed)     Endocrine   DM (diabetes mellitus), type 2, uncontrolled (HCC)    Now improved glucose readings at home with addn of semaglutide 0.25 mg daily  Rev home log-mostly below 150  Plan to continue glipizide xl 10 mg daily  Too early to check A1c but will plan next time         Genitourinary   CKD (chronic kidney disease) - Primary    Previously stopped metformin  Strongly enc better fluid intake  Worse with recent UTI (Reviewed hospital records, lab results and studies in detail  ) Clinical improvement  Labs today  Low threshold to ref to nephrology if needed  Also disc need for better glucose control       Relevant Orders    Comprehensive metabolic panel (Completed)   CBC with Differential/Platelet (Completed)   Acute lower UTI    S/p tx from ER with clinical improvement after tx with rocephin and keflex Inc cr (acute on chronic)  Re check this today Long discussion re: need for inc fluid intake (unsure if she will comply)  Reviewed hospital records, lab results and studies in detail          Relevant Orders   Comprehensive metabolic panel (Completed)   CBC with Differential/Platelet (Completed)

## 2020-09-12 NOTE — Telephone Encounter (Signed)
Pt was here for an OV and Ozempic was given to pt/daughter  2 boxes Lot # on both boxes: [id number] Exp: 05/06/23  NDC: 2707-8675-44

## 2020-09-14 ENCOUNTER — Encounter: Payer: Self-pay | Admitting: Family Medicine

## 2020-09-14 NOTE — Assessment & Plan Note (Signed)
Previously stopped metformin  Strongly enc better fluid intake  Worse with recent UTI (Reviewed hospital records, lab results and studies in detail  ) Clinical improvement  Labs today  Low threshold to ref to nephrology if needed  Also disc need for better glucose control

## 2020-09-14 NOTE — Assessment & Plan Note (Signed)
Stable bp /difficult to control  bp in fair control at this time  BP Readings from Last 1 Encounters:  09/12/20 138/60   No changes needed Most recent labs reviewed  Disc lifstyle change with low sodium diet and exercise  Plan to continue  Losartan hct 50-12.5 mg daily  Metoprolol xl 25 mg daily  Amlodipine 10 mg daily

## 2020-09-14 NOTE — Assessment & Plan Note (Addendum)
S/p tx from ER with clinical improvement after tx with rocephin and keflex Inc cr (acute on chronic)  Re check this today Long discussion re: need for inc fluid intake (unsure if she will comply)  Reviewed hospital records, lab results and studies in detail

## 2020-09-14 NOTE — Assessment & Plan Note (Signed)
Now improved glucose readings at home with addn of semaglutide 0.25 mg daily  Rev home log-mostly below 150  Plan to continue glipizide xl 10 mg daily  Too early to check A1c but will plan next time

## 2020-09-16 DIAGNOSIS — Z0100 Encounter for examination of eyes and vision without abnormal findings: Secondary | ICD-10-CM | POA: Diagnosis not present

## 2020-09-24 ENCOUNTER — Telehealth: Payer: Self-pay

## 2020-09-24 NOTE — Chronic Care Management (AMB) (Addendum)
Chronic Care Management Pharmacy Assistant   Name: Amanda Benson  MRN: [id number] DOB: 05-16-1935  Reason for Encounter: Medication Adherence and Delivery Coordination  Recent office visits:  09/12/20- PCP- CKD- Ordered CBC with diff and CMP. Continue current medications   Recent consult visits:  None since last CCM contact  Hospital visits:  09/04/20- ED visit for dehydration. No Admission.   Medications: Outpatient Encounter Medications as of 09/24/2020  Medication Sig   amLODipine (NORVASC) 10 MG tablet TAKE 1 TABLET(10 MG) BY MOUTH DAILY   aspirin 81 MG EC tablet Take 81 mg by mouth daily.   buPROPion (WELLBUTRIN XL) 300 MG 24 hr tablet TAKE 1 TABLET(300 MG) BY MOUTH DAILY   cephALEXin (KEFLEX) 500 MG capsule Take 1 capsule (500 mg total) by mouth 4 (four) times daily.   cholecalciferol (VITAMIN D3) 25 MCG (1000 UNIT) tablet Take 1,000 Units by mouth daily.   fluconazole (DIFLUCAN) 100 MG tablet Take one pill by mouth every other day for 3 doses   FLUoxetine (PROZAC) 40 MG capsule TAKE 1 CAPSULE(40 MG) BY MOUTH DAILY   glipiZIDE (GLUCOTROL XL) 10 MG 24 hr tablet TAKE 1 TABLET(10 MG) BY MOUTH DAILY   Insulin Pen Needle 29G X MISC Use with insulin daily   losartan-hydrochlorothiazide (HYZAAR) 100-25 MG tablet Take 0.5 tablets by mouth daily.   metoprolol succinate (TOPROL-XL) 25 MG 24 hr tablet TAKE 1 TABLET(25 MG) BY MOUTH DAILY   pantoprazole (PROTONIX) 40 MG tablet Take 1 tablet (40 mg total) by mouth daily.   raloxifene (EVISTA) 60 MG tablet TAKE 1 TABLET(60 MG) BY MOUTH DAILY   Semaglutide,0.25 or 0.5MG /DOS, (OZEMPIC, 0.25 OR 0.5 MG/DOSE,) 2 MG/1.5ML SOPN Inject 0.25 mg into the skin once a week.   simvastatin (ZOCOR) 20 MG tablet TAKE 1 TABLET(20 MG) BY MOUTH DAILY   No facility-administered encounter medications on file as of 09/24/2020.    BP Readings from Last 3 Encounters:  09/12/20 138/60  09/04/20 (!) 156/58  06/26/20 (!) 153/53    Lab Results  Component  Value Date   HGBA1C 8.0 (H) 06/21/2020      Recent OV, Consult or Hospital visit:  09/12/20- PCP 09/04/20- ED  No medication changes indicated   Last adherence delivery date: 09/05/20   Patient is due for next adherence delivery on: 10/07/20  Spoke with patient on 09/24/20 and reviewed medications and coordinated delivery.  This delivery to include: Adherence Packaging  30 Days  Packs: Aspirin 81mg -1 tablet daily (1 evening meal) Multivitamin (Women's One A Day) - 2 gummies daily (2 breakfast) Calcium/Vitamin D (800 units Vit D/600mg  Calcium)- 1 tablet daily (1 evening meal) Losartan 50mg  - 1 tablet daily (1 breakfast) Hydrochlorothiazide 12.5 mg- 1 tablet (1 breakfast) Amlodipine 10mg - 1 tablet daily (1 breakfast) Glipizide 10mg  XL - 1 tablet daily (1 breakfast) Fluoxetine 40mg  - 1 capsule daily (1 breakfast) Metoprolol Succinate 25mg - 1 tablet daily (1 evening meal) Bupropion 300mg  24 hr-1 tablet daily (1 breakfast) Simvastatin 20mg -1 tablet daily (1 evening meal) Raloxifene 60mg -1 tablet daily (1 breakfast)  Patient declined the following medications this month: Ozempic Inj 2/1.5 ML inject 0.25 mg into skin once a week - Getting from patient assistance Pantoprazole 40 mg- 1 tablet daily- Daughter states it was only short term prescription.  Ofloxacin eye drops- Short term use. No longer needed Prednisolone acetate eye drops-  Short term use. No longer needed  Any concerns about your medications? No  How often do you forget or accidentally  miss a dose? Never  Do you use a pillbox? No  Is patient in packaging Yes  If yes  What is the date on your next pill pack? 09/24/20  Any concerns or issues with your packaging? No concerns, very happy with packaging   No refill request needed from PCP.  Confirmed delivery date of 10/07/20, advised patient that pharmacy will contact her the morning of delivery.  Recent blood pressure readings are as follows: 100/74 142/68 162/67 -  this was done before taking her medication.    Recent blood glucose readings are as follows: States BG levels range from 98-130 this is throughout the day. Not all fasting.    Phil Dopp, CPP notified  Jomarie Longs, [facility] Clinical Pharmacy Assistant [phone]  I have reviewed the care management and care coordination activities outlined in this encounter and I am certifying that I agree with the content of this note. No further action required.  Phil Dopp, PharmD Clinical Pharmacist [facility] at [facility] [phone]

## 2020-09-28 ENCOUNTER — Encounter: Payer: Self-pay | Admitting: Family Medicine

## 2020-09-28 ENCOUNTER — Telehealth: Payer: Self-pay | Admitting: Family Medicine

## 2020-09-28 DIAGNOSIS — N1832 Chronic kidney disease, stage 3b: Secondary | ICD-10-CM

## 2020-09-28 DIAGNOSIS — E1165 Type 2 diabetes mellitus with hyperglycemia: Secondary | ICD-10-CM

## 2020-09-28 DIAGNOSIS — E038 Other specified hypothyroidism: Secondary | ICD-10-CM

## 2020-09-28 DIAGNOSIS — R531 Weakness: Secondary | ICD-10-CM

## 2020-09-28 DIAGNOSIS — D72829 Elevated white blood cell count, unspecified: Secondary | ICD-10-CM

## 2020-09-28 DIAGNOSIS — I10 Essential (primary) hypertension: Secondary | ICD-10-CM

## 2020-09-28 NOTE — Telephone Encounter (Signed)
-----   Message from Alvina Chou sent at 09/16/2020  2:08 PM EDT ----- Regarding: Lab orders for Monday, 6.27.22 Lab orders for a 2 week f/u

## 2020-09-29 ENCOUNTER — Other Ambulatory Visit: Payer: Medicare HMO

## 2020-09-29 ENCOUNTER — Encounter: Payer: Self-pay | Admitting: Emergency Medicine

## 2020-09-29 ENCOUNTER — Emergency Department: Payer: Medicare HMO

## 2020-09-29 ENCOUNTER — Other Ambulatory Visit: Payer: Self-pay

## 2020-09-29 ENCOUNTER — Emergency Department
Admission: EM | Admit: 2020-09-29 | Discharge: 2020-09-29 | Disposition: A | Discharge status: Home / Self Care | Payer: Medicare HMO | Attending: Emergency Medicine | Admitting: Emergency Medicine

## 2020-09-29 DIAGNOSIS — R531 Weakness: Secondary | ICD-10-CM | POA: Diagnosis not present

## 2020-09-29 DIAGNOSIS — E1122 Type 2 diabetes mellitus with diabetic chronic kidney disease: Secondary | ICD-10-CM | POA: Diagnosis not present

## 2020-09-29 DIAGNOSIS — I129 Hypertensive chronic kidney disease with stage 1 through stage 4 chronic kidney disease, or unspecified chronic kidney disease: Secondary | ICD-10-CM | POA: Insufficient documentation

## 2020-09-29 DIAGNOSIS — Z20822 Contact with and (suspected) exposure to covid-19: Secondary | ICD-10-CM | POA: Diagnosis not present

## 2020-09-29 DIAGNOSIS — N189 Chronic kidney disease, unspecified: Secondary | ICD-10-CM | POA: Diagnosis not present

## 2020-09-29 DIAGNOSIS — E161 Other hypoglycemia: Secondary | ICD-10-CM | POA: Diagnosis not present

## 2020-09-29 DIAGNOSIS — E1169 Type 2 diabetes mellitus with other specified complication: Secondary | ICD-10-CM | POA: Diagnosis not present

## 2020-09-29 DIAGNOSIS — R5383 Other fatigue: Secondary | ICD-10-CM | POA: Insufficient documentation

## 2020-09-29 DIAGNOSIS — R410 Disorientation, unspecified: Secondary | ICD-10-CM | POA: Diagnosis not present

## 2020-09-29 DIAGNOSIS — E785 Hyperlipidemia, unspecified: Secondary | ICD-10-CM | POA: Insufficient documentation

## 2020-09-29 DIAGNOSIS — E039 Hypothyroidism, unspecified: Secondary | ICD-10-CM | POA: Diagnosis not present

## 2020-09-29 DIAGNOSIS — Z7982 Long term (current) use of aspirin: Secondary | ICD-10-CM | POA: Insufficient documentation

## 2020-09-29 DIAGNOSIS — Z79899 Other long term (current) drug therapy: Secondary | ICD-10-CM | POA: Diagnosis not present

## 2020-09-29 DIAGNOSIS — R630 Anorexia: Secondary | ICD-10-CM | POA: Insufficient documentation

## 2020-09-29 DIAGNOSIS — Z7984 Long term (current) use of oral hypoglycemic drugs: Secondary | ICD-10-CM | POA: Insufficient documentation

## 2020-09-29 DIAGNOSIS — I10 Essential (primary) hypertension: Secondary | ICD-10-CM | POA: Diagnosis not present

## 2020-09-29 DIAGNOSIS — E860 Dehydration: Secondary | ICD-10-CM | POA: Insufficient documentation

## 2020-09-29 DIAGNOSIS — E162 Hypoglycemia, unspecified: Secondary | ICD-10-CM | POA: Diagnosis not present

## 2020-09-29 DIAGNOSIS — Z794 Long term (current) use of insulin: Secondary | ICD-10-CM | POA: Insufficient documentation

## 2020-09-29 LAB — RESP PANEL BY RT-PCR (FLU A&B, COVID) ARPGX2
Influenza A by PCR: NEGATIVE
Influenza B by PCR: NEGATIVE
SARS Coronavirus 2 by RT PCR: NEGATIVE

## 2020-09-29 LAB — URINALYSIS, COMPLETE (UACMP) WITH MICROSCOPIC
Bacteria, UA: NONE SEEN
Bilirubin Urine: NEGATIVE
Glucose, UA: NEGATIVE mg/dL
Hgb urine dipstick: NEGATIVE
Ketones, ur: NEGATIVE mg/dL
Leukocytes,Ua: NEGATIVE
Nitrite: NEGATIVE
Protein, ur: NEGATIVE mg/dL
Specific Gravity, Urine: 1.009 (ref 1.005–1.030)
pH: 7 (ref 5.0–8.0)

## 2020-09-29 LAB — COMPREHENSIVE METABOLIC PANEL
ALT: 37 U/L (ref 0–44)
AST: 38 U/L (ref 15–41)
Albumin: 2.9 g/dL — ABNORMAL LOW (ref 3.5–5.0)
Alkaline Phosphatase: 58 U/L (ref 38–126)
Anion gap: 8 (ref 5–15)
BUN: 30 mg/dL — ABNORMAL HIGH (ref 8–23)
CO2: 34 mmol/L — ABNORMAL HIGH (ref 22–32)
Calcium: 13.4 mg/dL (ref 8.9–10.3)
Chloride: 94 mmol/L — ABNORMAL LOW (ref 98–111)
Creatinine, Ser: 1.4 mg/dL — ABNORMAL HIGH (ref 0.44–1.00)
GFR, Estimated: 37 mL/min — ABNORMAL LOW (ref >60)
Glucose, Bld: 72 mg/dL (ref 70–99)
Potassium: 3.2 mmol/L — ABNORMAL LOW (ref 3.5–5.1)
Sodium: 136 mmol/L (ref 135–145)
Total Bilirubin: 0.7 mg/dL (ref 0.3–1.2)
Total Protein: 6.6 g/dL (ref 6.5–8.1)

## 2020-09-29 LAB — CBC WITH DIFFERENTIAL/PLATELET
Abs Immature Granulocytes: 0.04 10*3/uL (ref 0.00–0.07)
Basophils Absolute: 0 10*3/uL (ref 0.0–0.1)
Basophils Relative: 0 %
Eosinophils Absolute: 0.2 10*3/uL (ref 0.0–0.5)
Eosinophils Relative: 1 %
HCT: 39.1 % (ref 36.0–46.0)
Hemoglobin: 13.1 g/dL (ref 12.0–15.0)
Immature Granulocytes: 0 %
Lymphocytes Relative: 17 %
Lymphs Abs: 2.1 10*3/uL (ref 0.7–4.0)
MCH: 28.9 pg (ref 26.0–34.0)
MCHC: 33.5 g/dL (ref 30.0–36.0)
MCV: 86.1 fL (ref 80.0–100.0)
Monocytes Absolute: 1.3 10*3/uL — ABNORMAL HIGH (ref 0.1–1.0)
Monocytes Relative: 11 %
Neutro Abs: 8.6 10*3/uL — ABNORMAL HIGH (ref 1.7–7.7)
Neutrophils Relative %: 71 %
Platelets: 369 10*3/uL (ref 150–400)
RBC: 4.54 MIL/uL (ref 3.87–5.11)
RDW: 13.6 % (ref 11.5–15.5)
WBC: 12.2 10*3/uL — ABNORMAL HIGH (ref 4.0–10.5)
nRBC: 0 % (ref 0.0–0.2)

## 2020-09-29 LAB — VITAMIN D 25 HYDROXY (VIT D DEFICIENCY, FRACTURES): Vit D, 25-Hydroxy: 89.33 ng/mL (ref 30–100)

## 2020-09-29 LAB — LIPASE, BLOOD: Lipase: 21 U/L (ref 11–51)

## 2020-09-29 MED ORDER — ONDANSETRON HCL 4 MG/2ML IJ SOLN
4.0000 mg | Freq: Once | INTRAMUSCULAR | Status: AC
  Administered 2020-09-29: 4 mg via INTRAVENOUS
  Filled 2020-09-29: qty 2

## 2020-09-29 MED ORDER — LACTATED RINGERS IV BOLUS
1000.0000 mL | Freq: Once | INTRAVENOUS | Status: AC
  Administered 2020-09-29: 1000 mL via INTRAVENOUS

## 2020-09-29 NOTE — ED Provider Notes (Signed)
[facility] Emergency Department Provider Note  ____________________________________________  Time seen: Approximately 1:40 PM  I have reviewed the triage vital signs and the nursing notes.   HISTORY  Chief Complaint Weakness and Altered Mental Status    HPI Amanda KaufmannShirley A Benson is a 85 y.o. female with a history of hypertension, osteoporosis, hyperlipidemia, type 2 diabetes who comes the ED complaining of fatigue and decreased appetite for the past 4 days.  No falls, no acute pain or shortness of breath, no fever.  Denies vomiting or diarrhea.  She does note some decreased oral intake.  No dizziness, vision change, paresthesias or motor weakness.  No aggravating or alleviating factors.    Past Medical History:  Diagnosis Date   Depression    Diabetes mellitus    type II   Hyperlipidemia    Hypertension    Obesity    Osteoporosis      Patient Active Problem List   Diagnosis Date Noted   Hypophosphatemia    Hypomagnesemia    Hypokalemia    B12 deficiency    Sepsis (HCC) 06/21/2020   Acute lower UTI 06/21/2020   Acute metabolic encephalopathy 06/21/2020   Hyponatremia 06/21/2020   H/O sepsis 12/09/2019   CKD (chronic kidney disease) 11/23/2019   Leukocytosis 11/23/2019   Mobility impaired 10/13/2019   Incontinence of urine 10/12/2019   Grief reaction 03/27/2019   Elevated serum creatinine 06/13/2018   Dysuria 06/13/2018   Hearing loss 01/02/2018   Subclinical hypothyroidism 10/26/2017   Anxiety and depression 08/24/2016   Falls frequently 08/24/2016   Weakness 08/24/2016   Routine general medical examination at a health care facility 05/16/2015   Breast calcifications on mammogram 03/01/2014   Encounter for Medicare annual wellness exam 05/30/2013   Breast calcification seen on mammogram 12/14/2012   Breast calcification, left 11/28/2012   History of falling 11/27/2012   Poor balance 11/27/2012   Colon cancer screening 05/10/2012    Osteoporosis 06/21/2007   DM (diabetes mellitus), type 2, uncontrolled (HCC) 09/20/2006   Type 2 diabetes mellitus with hyperlipidemia (HCC) 09/20/2006   Essential hypertension 09/20/2006   Osteoarthritis 09/20/2006     Past Surgical History:  Procedure Laterality Date   ANKLE SURGERY     left medial malleblus fracture   CHOLECYSTECTOMY       Prior to Admission medications   Medication Sig Start Date End Date Taking? Authorizing Provider  amLODipine (NORVASC) 10 MG tablet TAKE 1 TABLET(10 MG) BY MOUTH DAILY 07/01/20  Yes Tower, Audrie GallusMarne A, MD  aspirin 81 MG EC tablet Take 81 mg by mouth daily.   Yes [provider]  buPROPion (WELLBUTRIN XL) 300 MG 24 hr tablet TAKE 1 TABLET(300 MG) BY MOUTH DAILY 07/30/20  Yes Tower, Marne A, MD  cephALEXin (KEFLEX) 500 MG capsule Take 1 capsule (500 mg total) by mouth 4 (four) times daily. 09/04/20  Yes Jacalyn LefevreHaviland, Julie, MD  cholecalciferol (VITAMIN D3) 25 MCG (1000 UNIT) tablet Take 1,000 Units by mouth daily.   Yes [provider]  FLUoxetine (PROZAC) 40 MG capsule TAKE 1 CAPSULE(40 MG) BY MOUTH DAILY 07/01/20  Yes Tower, Marne A, MD  glipiZIDE (GLUCOTROL XL) 10 MG 24 hr tablet TAKE 1 TABLET(10 MG) BY MOUTH DAILY 07/01/20  Yes Tower, Audrie GallusMarne A, MD  losartan-hydrochlorothiazide (HYZAAR) 100-25 MG tablet Take 0.5 tablets by mouth daily. 04/11/20  Yes Tower, Audrie GallusMarne A, MD  metoprolol succinate (TOPROL-XL) 25 MG 24 hr tablet TAKE 1 TABLET(25 MG) BY MOUTH DAILY 07/01/20  Yes Tower,  Audrie Gallus, MD  pantoprazole (PROTONIX) 40 MG tablet Take 1 tablet (40 mg total) by mouth daily. 06/26/20 09/29/20 Yes Sreenath, Sudheer B, MD  raloxifene (EVISTA) 60 MG tablet TAKE 1 TABLET(60 MG) BY MOUTH DAILY 07/30/20  Yes Tower, Marne A, MD  simvastatin (ZOCOR) 20 MG tablet TAKE 1 TABLET(20 MG) BY MOUTH DAILY 07/30/20  Yes Tower, Audrie Gallus, MD  fluconazole (DIFLUCAN) 100 MG tablet Take one pill by mouth every other day for 3 doses Patient not taking: No sig reported 07/07/20    Tower, Idamae Schuller A, MD  Insulin Pen Needle 29G X MISC Use with insulin daily 01/22/20   Tower, Audrie Gallus, MD  Semaglutide,0.25 or 0.5MG /DOS, (OZEMPIC, 0.25 OR 0.5 MG/DOSE,) 2 MG/1.5ML SOPN Inject 0.25 mg into the skin once a week. 02/11/20   Tower, Audrie Gallus, MD     Allergies Alendronate sodium   Family History  Problem Relation Age of Onset   Diabetes Mother    Hypertension Mother    Asthma Father    Hypertension Sister    Cancer Sister        lung   Asthma Brother    COPD Brother    Cancer Maternal Grandmother        breast    Asthma Brother    Diabetes Mellitus II Daughter    COPD Daughter     Social History Social History   Tobacco Use   Smoking status: Never   Smokeless tobacco: Never  Vaping Use   Vaping Use: Never used  Substance Use Topics   Alcohol use: No    Alcohol/week: 0.0 standard drinks   Drug use: No    Review of Systems  Constitutional:   No fever or chills.  ENT:   No sore throat. No rhinorrhea. Cardiovascular:   No chest pain or syncope. Respiratory:   No dyspnea or cough. Gastrointestinal:   Negative for abdominal pain, vomiting and diarrhea.  Musculoskeletal:   Negative for focal pain or swelling All other systems reviewed and are negative except as documented above in ROS and HPI.  ____________________________________________   PHYSICAL EXAM:  VITAL SIGNS: ED Triage Vitals [09/29/20 0819]  Enc Vitals Group     BP (!) 189/63     Pulse Rate (!) 52     Resp 14     Temp 97.8 F (36.6 C)     Temp Source Oral     SpO2 98 %     Weight 140 lb (63.5 kg)     Height 5\' 2"  (1.575 m)     Head Circumference      Peak Flow      Pain Score 0     Pain Loc      Pain Edu?      Excl. in GC?     Vital signs reviewed, nursing assessments reviewed.   Constitutional:   Alert and oriented. Non-toxic appearance. Eyes:   Conjunctivae are normal. EOMI. PERRL. ENT      Head:   Normocephalic and atraumatic.      Nose: Normal      Mouth/Throat:    Dry mucous membranes.      Neck:   No meningismus. Full ROM. Hematological/Lymphatic/Immunilogical:   No cervical lymphadenopathy. Cardiovascular:   Bradycardia heart rate 50. Symmetric bilateral radial and DP pulses.  No murmurs. Cap refill less than 2 seconds. Respiratory:   Normal respiratory effort without tachypnea/retractions. Breath sounds are clear and equal bilaterally. No wheezes/rales/rhonchi. Gastrointestinal:   Soft and  nontender. Non distended. There is no CVA tenderness.  No rebound, rigidity, or guarding. Genitourinary:   deferred Musculoskeletal:   Normal range of motion in all extremities. No joint effusions.  No lower extremity tenderness.  No edema. Neurologic:   Normal speech and language.  Motor grossly intact. No acute focal neurologic deficits are appreciated.  Skin:    Skin is warm, dry and intact. No rash noted.  No petechiae, purpura, or bullae.  Poor skin turgor  ____________________________________________    LABS (pertinent positives/negatives) (all labs ordered are listed, but only abnormal results are displayed) Labs Reviewed  COMPREHENSIVE METABOLIC PANEL - Abnormal; Notable for the following components:      Result Value   Potassium 3.2 (*)    Chloride 94 (*)    CO2 34 (*)    BUN 30 (*)    Creatinine, Ser 1.40 (*)    Calcium 13.4 (*)    Albumin 2.9 (*)    GFR, Estimated 37 (*)    All other components within normal limits  CBC WITH DIFFERENTIAL/PLATELET - Abnormal; Notable for the following components:   WBC 12.2 (*)    Neutro Abs 8.6 (*)    Monocytes Absolute 1.3 (*)    All other components within normal limits  URINALYSIS, COMPLETE (UACMP) WITH MICROSCOPIC - Abnormal; Notable for the following components:   Color, Urine YELLOW (*)    APPearance CLEAR (*)    All other components within normal limits  RESP PANEL BY RT-PCR (FLU A&B, COVID) ARPGX2  LIPASE, BLOOD  PARATHYROID HORMONE, INTACT (NO CA)  VITAMIN D 25 HYDROXY (VIT D DEFICIENCY,  FRACTURES)  PTH-RELATED PEPTIDE   ____________________________________________   EKG  Interpreted by me Sinus bradycardia rate of 51, left axis, poor R wave progression.  Normal ST segments and T waves, no ischemic changes.  ____________________________________________    RADIOLOGY  DG Chest Portable 1 View  Result Date: 09/29/2020 CLINICAL DATA:  Malaise, fatigue and weakness. EXAM: PORTABLE CHEST 1 VIEW COMPARISON:  09/04/2020 FINDINGS: The heart size and mediastinal contours are within normal limits. There is no evidence of pulmonary edema, consolidation, pneumothorax, nodule or pleural fluid. The visualized skeletal structures are unremarkable. IMPRESSION: No active disease. Electronically Signed   By: Irish Lack M.D.   On: 09/29/2020 08:45    ____________________________________________   PROCEDURES Procedures  ____________________________________________  DIFFERENTIAL DIAGNOSIS   Dehydration, electrolyte abnormality, UTI, pneumonia, COVID/flu  CLINICAL IMPRESSION / ASSESSMENT AND PLAN / ED COURSE  Medications ordered in the ED: Medications  lactated ringers bolus 1,000 mL (0 mLs Intravenous Stopped 09/29/20 1046)  ondansetron (ZOFRAN) injection 4 mg (4 mg Intravenous Given 09/29/20 0943)    Pertinent labs & imaging results that were available during my care of the patient were reviewed by me and considered in my medical decision making (see chart for details).  Amanda Benson was evaluated in Emergency Department on 09/29/2020 for the symptoms described in the history of present illness. She was evaluated in the context of the global COVID-19 pandemic, which necessitated consideration that the patient might be at risk for infection with the SARS-CoV-2 virus that causes COVID-19. Institutional protocols and algorithms that pertain to the evaluation of patients at risk for COVID-19 are in a state of rapid change based on information released by regulatory bodies  including the CDC and federal and state organizations. These policies and algorithms were followed during the patient's care in the ED.   Patient presents with constitutional symptoms of fatigue, appears clinically dehydrated.  Given IV fluids and does feel better.  Labs show some evidence of dehydration with elevated BUN, slightly low potassium, elevated calcium.  Hypercalcemia does not appear to be symptomatic.  Will add on PTH, PTH RP, vitamin D level for outpatient follow-up. Recommend patient discontinue her vitamin D supplement for now.  Also with heart rate persistently of 50 here, recommend she discontinue metoprolol which may help her feel better as well.  Stable for discharge home, patient agreeable to outpatient follow-up.  Doubt stroke, meningitis, encephalitis, intra-abdominal infection, sepsis, bowel obstruction, mesenteric ischemia, volvulus      ____________________________________________   FINAL CLINICAL IMPRESSION(S) / ED DIAGNOSES    Final diagnoses:  Fatigue, unspecified type  Dehydration     ED Discharge Orders     None       Portions of this note were generated with dragon dictation software. Dictation errors may occur despite best attempts at proofreading.   Sharman Cheek, MD 09/29/20 1346

## 2020-09-29 NOTE — ED Triage Notes (Signed)
Pt via EMS from home. Per the daughter has had a generalized weakness, lethargy, and decreased appetite since Thursday. Pt is A&Ox4 and NAD but lethargic on arrival.

## 2020-09-29 NOTE — Telephone Encounter (Signed)
Please call and let her know I ordered a ua today to add to her labs I also ordered TSH and free T4 I want to get labs back before I order home care for PT to make sure we know what is going on  Thanks

## 2020-09-29 NOTE — Telephone Encounter (Signed)
See new message, pt just went to ER by EMS

## 2020-09-29 NOTE — Discharge Instructions (Addendum)
Your test today are overall okay, but do show some signs of dehydration.  Your calcium level was elevated today, which will also be improved by the IV fluids we gave.  Discontinued your vitamin D3 supplement (which can increase your calcium level) for now until advised to resume by your doctor.  You should also stop taking metoprolol for now because your heart rate is below normal.

## 2020-09-29 NOTE — ED Notes (Signed)
Pt BIBA from home. Per pt's daughter, pt has been more weak than normal, lethargic, and has had a decreased in appetite since Thursday. Pt was recently seen here for UTI and acute dehydration. On arrival, pt is A&Ox4 but lethargic, pt denies any pain. Pt is bradycardiac on arrival but based on the VS the daughter has been keeping up with HR is baseline in the 40s-50s. Denies nausea at this time.

## 2020-09-29 NOTE — ED Notes (Signed)
Explained d/c instructions over the phone with the daughter that has been taking care of her. Daughter verbalized understanding. DNR was sent back with the granddaughter.

## 2020-09-29 NOTE — ED Notes (Signed)
This RN at bedside to d/c pt. Pt is unable to stand on her own, pt unable to bear weight. Spoke pt's daughter on the phone and explained this how she has been the past week and this was normal for her. Explained that pt could go back by EMS if needed, daughter states she rather her come back via car. Pt needed 3-person assistance getting in the wheelchair and getting in the car. States they had a plan to get her back in the house. Explained to daughter and granddaughter that they can call fire department if they need assistance. They verbalized understanding.

## 2020-09-30 LAB — PARATHYROID HORMONE, INTACT (NO CA): PTH: 8 pg/mL — ABNORMAL LOW (ref 15–65)

## 2020-10-03 ENCOUNTER — Other Ambulatory Visit: Payer: Self-pay

## 2020-10-03 ENCOUNTER — Ambulatory Visit (INDEPENDENT_AMBULATORY_CARE_PROVIDER_SITE_OTHER): Payer: Medicare HMO | Admitting: Family Medicine

## 2020-10-03 ENCOUNTER — Encounter: Payer: Self-pay | Admitting: Family Medicine

## 2020-10-03 VITALS — BP 146/68 | HR 84 | Temp 98.0°F | Ht 62.0 in | Wt 140.0 lb

## 2020-10-03 DIAGNOSIS — R5382 Chronic fatigue, unspecified: Secondary | ICD-10-CM | POA: Diagnosis not present

## 2020-10-03 DIAGNOSIS — R531 Weakness: Secondary | ICD-10-CM | POA: Diagnosis not present

## 2020-10-03 DIAGNOSIS — I10 Essential (primary) hypertension: Secondary | ICD-10-CM

## 2020-10-03 DIAGNOSIS — R1314 Dysphagia, pharyngoesophageal phase: Secondary | ICD-10-CM | POA: Diagnosis not present

## 2020-10-03 DIAGNOSIS — E1169 Type 2 diabetes mellitus with other specified complication: Secondary | ICD-10-CM

## 2020-10-03 DIAGNOSIS — E785 Hyperlipidemia, unspecified: Secondary | ICD-10-CM | POA: Diagnosis not present

## 2020-10-03 DIAGNOSIS — R1310 Dysphagia, unspecified: Secondary | ICD-10-CM | POA: Insufficient documentation

## 2020-10-03 NOTE — Patient Instructions (Addendum)
We will check labs in a week for calcium and chemistries and blood count   BP is acceptable  Stay off the metoprolol  Pulse rate is better off of it   Start checking glucose again  Encourage regular meals and fluids If not eating or drinking let me know   Discuss PT for home- call is you want me to put an order in  Watch swallowing closely- if it is not normal -I want to order a swallowing study

## 2020-10-03 NOTE — Progress Notes (Signed)
Subjective:    Patient ID: Amanda Benson, female    DOB: February 24, 1936, 85 y.o.   MRN: [id number]  This visit occurred during the SARS-CoV-2 public health emergency.  Safety protocols were in place, including screening questions prior to the visit, additional usage of staff PPE, and extensive cleaning of exam room while observing appropriate contact time as indicated for disinfecting solutions.   HPI Pt presents for f/u of ER visit on 09/29/20 for weakness and altered MS   She presented for fatigue/ decreased appetite for 4 days  Wt Readings from Last 3 Encounters:  10/03/20 140 lb (63.5 kg)  09/29/20 140 lb (63.5 kg)  09/04/20 111 lb (50.3 kg)   25.61 kg/m Bp was high at 189/63 upon presentation  No neuro deficits noted   Urine clear Wbc 12.2 K 3.2  Cr 1.4  Ca 13.4  Sinus bradycardia on EKG Rate of 51  Recent imaging  CT HEAD WO CONTRAST  Result Date: 09/04/2020 CLINICAL DATA:  Mental status changes. EXAM: CT HEAD WITHOUT CONTRAST TECHNIQUE: Contiguous axial images were obtained from the base of the skull through the vertex without intravenous contrast. COMPARISON:  06/21/2020 FINDINGS: Brain: There is no evidence for acute hemorrhage, hydrocephalus, mass lesion, or abnormal extra-axial fluid collection. No definite CT evidence for acute infarction. Age indeterminate lacunar infarct in the left basal ganglia is new in the interval since prior study. Diffuse loss of parenchymal volume is consistent with atrophy. Patchy low attenuation in the deep hemispheric and periventricular white matter is nonspecific, but likely reflects chronic microvascular ischemic demyelination. Vascular: No hyperdense vessel or unexpected calcification. Skull: No evidence for fracture. No worrisome lytic or sclerotic lesion. Sinuses/Orbits: The visualized paranasal sinuses and mastoid air cells are clear. Visualized portions of the globes and intraorbital fat are unremarkable. Other: None. IMPRESSION: 1. No  acute intracranial abnormality. 2. Atrophy with chronic small vessel white matter ischemic disease. 3. Age indeterminate lacunar infarct in the left basal ganglia, but new in the interval since prior study. Electronically Signed   By: Kennith Center M.D.   On: 09/04/2020 19:28   DG Chest Portable 1 View  Result Date: 09/29/2020 CLINICAL DATA:  Malaise, fatigue and weakness. EXAM: PORTABLE CHEST 1 VIEW COMPARISON:  09/04/2020 FINDINGS: The heart size and mediastinal contours are within normal limits. There is no evidence of pulmonary edema, consolidation, pneumothorax, nodule or pleural fluid. The visualized skeletal structures are unremarkable. IMPRESSION: No active disease. Electronically Signed   By: Irish Lack M.D.   On: 09/29/2020 08:45   DG Chest Port 1 View  Result Date: 09/04/2020 CLINICAL DATA:  Weakness and altered level of consciousness EXAM: PORTABLE CHEST 1 VIEW COMPARISON:  06/21/2020 FINDINGS: Cardiac shadow is mildly enlarged but stable. Aortic calcifications are seen. The lungs are well aerated bilaterally. No focal infiltrate or sizable effusion is seen. No bony abnormality is noted. IMPRESSION: No acute abnormality noted. Aortic Atherosclerosis (ICD10-I70.0). Electronically Signed   By: Alcide Clever M.D.   On: 09/04/2020 18:57    She was tx with IVF and zofran  Diagnosed with dehydration   Added on PTH, PTH RP and vit D level  Recommended vit D eupplement for now  Adv to d/c metoprolol for bradycardia  Wt Readings from Last 3 Encounters:  10/03/20 140 lb (63.5 kg)  09/29/20 140 lb (63.5 kg)  09/04/20 111 lb (50.3 kg)   25.61 kg/m   BP Readings from Last 3 Encounters:  10/03/20 (!) 146/68  09/29/20 (!) 143/36  09/12/20 138/60   Pulse Readings from Last 3 Encounters:  10/03/20 84  09/29/20 (!) 43  09/12/20 (!) 54   Lab Results  Component Value Date   CREATININE 1.40 (H) 09/29/2020   BUN 30 (H) 09/29/2020   NA 136 09/29/2020   K 3.2 (L) 09/29/2020   CL 94  (L) 09/29/2020   CO2 34 (H) 09/29/2020    Lab Results  Component Value Date   ALT 37 09/29/2020   AST 38 09/29/2020   ALKPHOS 58 09/29/2020   BILITOT 0.7 09/29/2020    Lab Results  Component Value Date   WBC 12.2 (H) 09/29/2020   HGB 13.1 09/29/2020   HCT 39.1 09/29/2020   MCV 86.1 09/29/2020   PLT 369 09/29/2020   Albumin 2.9  PTH low at 8  Glucose 72  According to family she is low on motivation  Getting her on the bike to pedal it   Eating better  Drinking fluids -lots of water  Blood sugar was great in the ER   Poor dexterity in the fingers  Cannot check her own sugar A little tremor   Patient Active Problem List   Diagnosis Date Noted   Hypercalcemia 10/03/2020   Swallowing difficulty 10/03/2020   Hypophosphatemia    Hypomagnesemia    Hypokalemia    B12 deficiency    Sepsis (HCC) 06/21/2020   Acute lower UTI 06/21/2020   Acute metabolic encephalopathy 06/21/2020   Hyponatremia 06/21/2020   H/O sepsis 12/09/2019   CKD (chronic kidney disease) 11/23/2019   Leukocytosis 11/23/2019   Mobility impaired 10/13/2019   Incontinence of urine 10/12/2019   Grief reaction 03/27/2019   Elevated serum creatinine 06/13/2018   Dysuria 06/13/2018   Hearing loss 01/02/2018   Subclinical hypothyroidism 10/26/2017   Anxiety and depression 08/24/2016   Falls frequently 08/24/2016   Generalized weakness 08/24/2016   Routine general medical examination at a health care facility 05/16/2015   Breast calcifications on mammogram 03/01/2014   Encounter for Medicare annual wellness exam 05/30/2013   Breast calcification seen on mammogram 12/14/2012   Breast calcification, left 11/28/2012   History of falling 11/27/2012   Poor balance 11/27/2012   Colon cancer screening 05/10/2012   Osteoporosis 06/21/2007   DM (diabetes mellitus), type 2, uncontrolled (HCC) 09/20/2006   Type 2 diabetes mellitus with hyperlipidemia (HCC) 09/20/2006   Essential hypertension 09/20/2006    Osteoarthritis 09/20/2006   Past Medical History:  Diagnosis Date   Depression    Diabetes mellitus    type II   Hyperlipidemia    Hypertension    Obesity    Osteoporosis    Past Surgical History:  Procedure Laterality Date   ANKLE SURGERY     left medial malleblus fracture   CHOLECYSTECTOMY     Social History   Tobacco Use   Smoking status: Never   Smokeless tobacco: Never  Vaping Use   Vaping Use: Never used  Substance Use Topics   Alcohol use: No    Alcohol/week: 0.0 standard drinks   Drug use: No   Family History  Problem Relation Age of Onset   Diabetes Mother    Hypertension Mother    Asthma Father    Hypertension Sister    Cancer Sister        lung   Asthma Brother    COPD Brother    Cancer Maternal Grandmother        breast    Asthma Brother    Diabetes Mellitus II Daughter  COPD Daughter    Allergies  Allergen Reactions   Alendronate Sodium     REACTION: heartburn   Current Outpatient Medications on File Prior to Visit  Medication Sig Dispense Refill   amLODipine (NORVASC) 10 MG tablet TAKE 1 TABLET(10 MG) BY MOUTH DAILY 90 tablet 1   aspirin 81 MG EC tablet Take 81 mg by mouth daily.     buPROPion (WELLBUTRIN XL) 300 MG 24 hr tablet TAKE 1 TABLET(300 MG) BY MOUTH DAILY 90 tablet 1   cephALEXin (KEFLEX) 500 MG capsule Take 1 capsule (500 mg total) by mouth 4 (four) times daily. 28 capsule 0   FLUoxetine (PROZAC) 40 MG capsule TAKE 1 CAPSULE(40 MG) BY MOUTH DAILY 90 capsule 1   glipiZIDE (GLUCOTROL XL) 10 MG 24 hr tablet TAKE 1 TABLET(10 MG) BY MOUTH DAILY 90 tablet 1   Insulin Pen Needle 29G X MISC Use with insulin daily 90 each 0   losartan-hydrochlorothiazide (HYZAAR) 100-25 MG tablet Take 0.5 tablets by mouth daily. 45 tablet 3   raloxifene (EVISTA) 60 MG tablet TAKE 1 TABLET(60 MG) BY MOUTH DAILY 90 tablet 1   Semaglutide,0.25 or 0.5MG /DOS, (OZEMPIC, 0.25 OR 0.5 MG/DOSE,) 2 MG/1.5ML SOPN Inject 0.25 mg into the skin once a week. 1.5 mL  3   simvastatin (ZOCOR) 20 MG tablet TAKE 1 TABLET(20 MG) BY MOUTH DAILY 90 tablet 1   fluconazole (DIFLUCAN) 100 MG tablet Take one pill by mouth every other day for 3 doses (Patient not taking: Reported on 10/03/2020) 3 tablet 0   pantoprazole (PROTONIX) 40 MG tablet Take 1 tablet (40 mg total) by mouth daily. 30 tablet 0   No current facility-administered medications on file prior to visit.    Review of Systems  Constitutional:  Positive for fatigue. Negative for activity change, appetite change, fever and unexpected weight change.  HENT:  Negative for congestion, ear pain, rhinorrhea, sinus pressure and sore throat.   Eyes:  Negative for pain, redness and visual disturbance.  Respiratory:  Negative for cough, shortness of breath and wheezing.   Cardiovascular:  Negative for chest pain and palpitations.  Gastrointestinal:  Negative for abdominal pain, blood in stool, constipation and diarrhea.  Endocrine: Negative for polydipsia and polyuria.  Genitourinary:  Negative for dysuria, frequency and urgency.  Musculoskeletal:  Positive for arthralgias. Negative for back pain and myalgias.  Skin:  Negative for pallor and rash.  Allergic/Immunologic: Negative for environmental allergies.  Neurological:  Negative for dizziness, syncope and headaches.  Hematological:  Negative for adenopathy. Does not bruise/bleed easily.  Psychiatric/Behavioral:  Positive for dysphoric mood. Negative for decreased concentration, self-injury, sleep disturbance and suicidal ideas. The patient is not nervous/anxious.            Physical Exam Constitutional:      General: She is not in acute distress.    Appearance: Normal appearance. She is well-developed and normal weight. She is not ill-appearing or diaphoretic.     Comments: Frail appearing elderly female in wheelchair  HENT:     Head: Normocephalic and atraumatic.     Mouth/Throat:     Mouth: Mucous membranes are moist.  Eyes:     General:         Right eye: No discharge.        Left eye: No discharge.     Conjunctiva/sclera: Conjunctivae normal.     Pupils: Pupils are equal, round, and reactive to light.  Neck:     Thyroid: No thyromegaly.  Vascular: No carotid bruit or JVD.  Cardiovascular:     Rate and Rhythm: Normal rate and regular rhythm.     Heart sounds: Normal heart sounds.    No gallop.  Pulmonary:     Effort: Pulmonary effort is normal. No respiratory distress.     Breath sounds: Normal breath sounds. No wheezing or rales.  Abdominal:     General: Bowel sounds are normal. There is no distension or abdominal bruit.     Palpations: Abdomen is soft. There is no mass.     Tenderness: There is no abdominal tenderness.  Musculoskeletal:     Cervical back: Normal range of motion and neck supple. No tenderness.     Right lower leg: No edema.     Left lower leg: No edema.  Lymphadenopathy:     Cervical: No cervical adenopathy.  Skin:    General: Skin is warm and dry.     Coloration: Skin is not jaundiced or pale.     Findings: No erythema or rash.  Neurological:     Mental Status: She is alert.     Coordination: Coordination normal.     Deep Tendon Reflexes: Reflexes are normal and symmetric. Reflexes normal.  Psychiatric:        Attention and Perception: Attention normal.        Mood and Affect: Mood is depressed.        Speech: Speech normal.        Behavior: Behavior is slowed.          Assessment & Plan:   Problem List Items Addressed This Visit       Cardiovascular and Mediastinum   Essential hypertension    BP is much better than at hospitalization  Will continue holding the metoprolol in light of bradycardia bp in fair control at this time  BP Readings from Last 1 Encounters:  10/03/20 (!) 146/68   No changes needed Most recent labs reviewed  Disc lifstyle change with low sodium diet and exercise   Plan to continue amlipdine 10 mg daily and losartan hct 100-25 mg daily  Labs planned for a  week for bmp         Relevant Orders   Basic metabolic panel   CBC with Differential/Platelet   TSH     Digestive   Swallowing difficulty    Since hospitalization  Reviewed hospital records, lab results and studies in detail   No other focal signs of cva  Family is decidng whether or not to pursue a swallowing study         Endocrine   Type 2 diabetes mellitus with hyperlipidemia (HCC)    Lab Results  Component Value Date   HGBA1C 8.0 (H) 06/21/2020  This was high but glucose readings are trending down with less appetite and fluid intake  Continue glipizide xl 10 mg daily (inst to hold if she stops eating)         Relevant Orders   Hemoglobin A1c     Other   Generalized weakness - Primary    Profound weak episode with hospitalization  Reviewed hospital records, lab results and studies in detail   Dehydration sited as likely cause  Watching elevated ca level and renal labs  Some gradual improvement at home  Off beta blocker and no longer bradycardic  Much improved appetite and taking in fluids  Enc strongly to allow home care/PT to come (pt declines) -family will discuss Also enc to let us  order a swallowing study due to some concern re: swallowing issue No focal neurol deficits but rev her CT and disc poss of stroke and what to watch for  Following low GFR and high ca with nl PTH Planning labs for a week Family is caring for her best she can  Little motivation to move or get better unfortunately  Suspect some depression / pt not interested in tx    No focal        Hypercalcemia    This is new in the hospital  Lab Results  Component Value Date   PTH 8 (L) 09/29/2020   CALCIUM 13.4 (HH) 09/29/2020   PHOS 2.3 (L) 06/24/2020   Was inst to hold vit D  GFR lower at this time  Will re check this in a week  Better diet and water intake now  Low alb at 2.9  H/o OP   Will plan labs in a week to incl D level and PTHrp  1-23 dihydroxy D and 25 hydroxy  D        Relevant Orders   PTH-Related Peptide   Quest[employer]D 25-Hydroxy and 1,25-Dihydroxyvitamin D   PTH, Intact (ICMA) and Ionized Calcium   Basic metabolic panel   Fatigue    Episode of profound fatigue and decreased appetite and motivation  Slowly improved Reviewed hospital records, lab results and studies in detail   Watching labs and enc strongly to allow home PT in        Relevant Orders   Basic metabolic panel   CBC with Differential/Platelet   TSH

## 2020-10-05 DIAGNOSIS — R5383 Other fatigue: Secondary | ICD-10-CM | POA: Insufficient documentation

## 2020-10-05 NOTE — Assessment & Plan Note (Signed)
BP is much better than at hospitalization  Will continue holding the metoprolol in light of bradycardia bp in fair control at this time  BP Readings from Last 1 Encounters:  10/03/20 (!) 146/68   No changes needed Most recent labs reviewed  Disc lifstyle change with low sodium diet and exercise   Plan to continue amlipdine 10 mg daily and losartan hct 100-25 mg daily  Labs planned for a week for bmp

## 2020-10-05 NOTE — Assessment & Plan Note (Addendum)
This is new in the hospital  Lab Results  Component Value Date   PTH 8 (L) 09/29/2020   CALCIUM 13.4 (HH) 09/29/2020   PHOS 2.3 (L) 06/24/2020   Was inst to hold vit D  GFR lower at this time  Will re check this in a week  Better diet and water intake now  Low alb at 2.9  H/o OP   Will plan labs in a week to incl D level and PTHrp  1-23 dihydroxy D and 25 hydroxy D

## 2020-10-05 NOTE — Assessment & Plan Note (Signed)
Profound weak episode with hospitalization  Reviewed hospital records, lab results and studies in detail   Dehydration sited as likely cause  Watching elevated ca level and renal labs  Some gradual improvement at home  Off beta blocker and no longer bradycardic  Much improved appetite and taking in fluids  Enc strongly to allow home care/PT to come (pt declines) -family will discuss Also enc to let us order a swallowing study due to some concern re: swallowing issue No focal neurol deficits but rev her CT and disc poss of stroke and what to watch for  Following low GFR and high ca with nl PTH Planning labs for a week Family is caring for her best she can  Little motivation to move or get better unfortunately  Suspect some depression / pt not interested in tx    No focal

## 2020-10-05 NOTE — Assessment & Plan Note (Signed)
Episode of profound fatigue and decreased appetite and motivation  Slowly improved Reviewed hospital records, lab results and studies in detail   Watching labs and enc strongly to allow home PT in

## 2020-10-05 NOTE — Assessment & Plan Note (Signed)
Lab Results  Component Value Date   HGBA1C 8.0 (H) 06/21/2020   This was high but glucose readings are trending down with less appetite and fluid intake  Continue glipizide xl 10 mg daily (inst to hold if she stops eating)

## 2020-10-05 NOTE — Assessment & Plan Note (Signed)
Since hospitalization  Reviewed hospital records, lab results and studies in detail   No other focal signs of cva  Family is decidng whether or not to pursue a swallowing study

## 2020-10-10 ENCOUNTER — Other Ambulatory Visit: Payer: Medicare HMO

## 2020-10-10 ENCOUNTER — Encounter: Payer: Self-pay | Admitting: Family Medicine

## 2020-10-10 DIAGNOSIS — Z9181 History of falling: Secondary | ICD-10-CM

## 2020-10-10 DIAGNOSIS — R2689 Other abnormalities of gait and mobility: Secondary | ICD-10-CM

## 2020-10-10 DIAGNOSIS — R531 Weakness: Secondary | ICD-10-CM

## 2020-10-10 DIAGNOSIS — R32 Unspecified urinary incontinence: Secondary | ICD-10-CM

## 2020-10-10 DIAGNOSIS — R238 Other skin changes: Secondary | ICD-10-CM

## 2020-10-10 DIAGNOSIS — Z7409 Other reduced mobility: Secondary | ICD-10-CM

## 2020-10-11 DIAGNOSIS — R238 Other skin changes: Secondary | ICD-10-CM | POA: Insufficient documentation

## 2020-10-21 ENCOUNTER — Emergency Department
Admission: EM | Admit: 2020-10-21 | Discharge: 2020-10-22 | Disposition: A | Discharge status: Home / Self Care | Payer: Medicare HMO | Attending: Emergency Medicine | Admitting: Emergency Medicine

## 2020-10-21 ENCOUNTER — Telehealth: Payer: Self-pay | Admitting: *Deleted

## 2020-10-21 ENCOUNTER — Emergency Department: Payer: Medicare HMO

## 2020-10-21 DIAGNOSIS — N200 Calculus of kidney: Secondary | ICD-10-CM | POA: Diagnosis not present

## 2020-10-21 DIAGNOSIS — R4182 Altered mental status, unspecified: Secondary | ICD-10-CM | POA: Diagnosis not present

## 2020-10-21 DIAGNOSIS — Z794 Long term (current) use of insulin: Secondary | ICD-10-CM | POA: Insufficient documentation

## 2020-10-21 DIAGNOSIS — E876 Hypokalemia: Secondary | ICD-10-CM | POA: Diagnosis not present

## 2020-10-21 DIAGNOSIS — Z7984 Long term (current) use of oral hypoglycemic drugs: Secondary | ICD-10-CM | POA: Diagnosis not present

## 2020-10-21 DIAGNOSIS — E039 Hypothyroidism, unspecified: Secondary | ICD-10-CM | POA: Diagnosis not present

## 2020-10-21 DIAGNOSIS — I129 Hypertensive chronic kidney disease with stage 1 through stage 4 chronic kidney disease, or unspecified chronic kidney disease: Secondary | ICD-10-CM | POA: Insufficient documentation

## 2020-10-21 DIAGNOSIS — E1122 Type 2 diabetes mellitus with diabetic chronic kidney disease: Secondary | ICD-10-CM | POA: Diagnosis not present

## 2020-10-21 DIAGNOSIS — Z79899 Other long term (current) drug therapy: Secondary | ICD-10-CM | POA: Diagnosis not present

## 2020-10-21 DIAGNOSIS — M549 Dorsalgia, unspecified: Secondary | ICD-10-CM | POA: Diagnosis not present

## 2020-10-21 DIAGNOSIS — L89159 Pressure ulcer of sacral region, unspecified stage: Secondary | ICD-10-CM | POA: Insufficient documentation

## 2020-10-21 DIAGNOSIS — Z872 Personal history of diseases of the skin and subcutaneous tissue: Secondary | ICD-10-CM | POA: Diagnosis not present

## 2020-10-21 DIAGNOSIS — R531 Weakness: Secondary | ICD-10-CM | POA: Diagnosis not present

## 2020-10-21 DIAGNOSIS — N189 Chronic kidney disease, unspecified: Secondary | ICD-10-CM | POA: Diagnosis not present

## 2020-10-21 DIAGNOSIS — Z7982 Long term (current) use of aspirin: Secondary | ICD-10-CM | POA: Diagnosis not present

## 2020-10-21 DIAGNOSIS — S31000A Unspecified open wound of lower back and pelvis without penetration into retroperitoneum, initial encounter: Secondary | ICD-10-CM

## 2020-10-21 DIAGNOSIS — K5792 Diverticulitis of intestine, part unspecified, without perforation or abscess without bleeding: Secondary | ICD-10-CM | POA: Insufficient documentation

## 2020-10-21 DIAGNOSIS — M545 Low back pain, unspecified: Secondary | ICD-10-CM | POA: Diagnosis present

## 2020-10-21 DIAGNOSIS — I10 Essential (primary) hypertension: Secondary | ICD-10-CM | POA: Diagnosis not present

## 2020-10-21 DIAGNOSIS — K5730 Diverticulosis of large intestine without perforation or abscess without bleeding: Secondary | ICD-10-CM | POA: Diagnosis not present

## 2020-10-21 LAB — CBC
HCT: 38.1 % (ref 36.0–46.0)
Hemoglobin: 13 g/dL (ref 12.0–15.0)
MCH: 29.1 pg (ref 26.0–34.0)
MCHC: 34.1 g/dL (ref 30.0–36.0)
MCV: 85.4 fL (ref 80.0–100.0)
Platelets: 560 10*3/uL — ABNORMAL HIGH (ref 150–400)
RBC: 4.46 MIL/uL (ref 3.87–5.11)
RDW: 13.7 % (ref 11.5–15.5)
WBC: 14.2 10*3/uL — ABNORMAL HIGH (ref 4.0–10.5)
nRBC: 0 % (ref 0.0–0.2)

## 2020-10-21 LAB — BASIC METABOLIC PANEL
Anion gap: 9 (ref 5–15)
BUN: 20 mg/dL (ref 8–23)
CO2: 26 mmol/L (ref 22–32)
Calcium: 8.7 mg/dL — ABNORMAL LOW (ref 8.9–10.3)
Chloride: 98 mmol/L (ref 98–111)
Creatinine, Ser: 0.76 mg/dL (ref 0.44–1.00)
GFR, Estimated: >60 mL/min (ref >60)
Glucose, Bld: 155 mg/dL — ABNORMAL HIGH (ref 70–99)
Potassium: 3.4 mmol/L — ABNORMAL LOW (ref 3.5–5.1)
Sodium: 133 mmol/L — ABNORMAL LOW (ref 135–145)

## 2020-10-21 LAB — URINALYSIS, COMPLETE (UACMP) WITH MICROSCOPIC
Bacteria, UA: NONE SEEN
Bilirubin Urine: NEGATIVE
Glucose, UA: 50 mg/dL — AB
Ketones, ur: 5 mg/dL — AB
Nitrite: NEGATIVE
Protein, ur: NEGATIVE mg/dL
Specific Gravity, Urine: 1.014 (ref 1.005–1.030)
pH: 5 (ref 5.0–8.0)

## 2020-10-21 MED ORDER — LIDOCAINE 5 % EX PTCH
1.0000 | MEDICATED_PATCH | CUTANEOUS | Status: DC
  Administered 2020-10-21: 1 via TRANSDERMAL
  Filled 2020-10-21: qty 1

## 2020-10-21 MED ORDER — POTASSIUM CHLORIDE CRYS ER 20 MEQ PO TBCR: 40.0000 meq | EXTENDED_RELEASE_TABLET | Freq: Once | ORAL | Status: DC

## 2020-10-21 MED ORDER — POTASSIUM CHLORIDE 20 MEQ PO PACK
40.0000 meq | PACK | Freq: Once | ORAL | Status: AC
  Administered 2020-10-21: 40 meq via ORAL
  Filled 2020-10-21: qty 2

## 2020-10-21 NOTE — ED Provider Notes (Signed)
Labs pending, signed out to Dr Dolores Frame.   Arnaldo Natal, MD 10/21/20 [id number]

## 2020-10-21 NOTE — ED Provider Notes (Signed)
[facility] Emergency Department Provider Note  ____________________________________________   Event Date/Time   First MD Initiated Contact with Patient 10/21/20 2011     (approximate)  I have reviewed the triage vital signs and the nursing notes.   HISTORY  Chief Complaint Back Pain and Weakness   HPI Efraim KaufmannShirley A Ulibarri is a 85 y.o. female with a past medical history of depression, DM, HTN, HDL, osteoporosis, recurrent UTIs and CVA without residual deficits diagnosed on CT last month reportedly bedbound for the last several weeks who presents from home via EMS for assessment of some back pain and the sacral wound noticed by family who takes care of her 24/7.  Patient states she is not sure why she is in emergency room and does not currently have any pain.  She is oriented and states she has not had any falls, chest pain, cough, shortness of breath, vomiting, diarrhea, dysuria, rash and her back pain feels fine right now.  She denies any other acute concerns.  I was able to reach her daughter who is one of her caregivers who stated that they noticed the sacral wound today and the felt that she could they could not get their mother comfortable.  Seems she has not had any falls or weakness or numbness but has had fairly severe generalized weakness and been bedbound for the last several weeks.  States she requires fairly significant assistance with eating.  Patient's daughter notes they took the patient to [facility] emergency room in [place] on 6/2 after patient developed sudden onset of weakness and some difficulty speaking and head CT at that time was done that showed indeterminate CVA and she is wondering if her generalized weakness and debilitation could be from that.  Patient otherwise has no acute concerns at this time.  No other acute concerns per daughter.         Past Medical History:  Diagnosis Date   Depression    Diabetes mellitus    type II    Hyperlipidemia    Hypertension    Obesity    Osteoporosis     Patient Active Problem List   Diagnosis Date Noted   Skin breakdown 10/11/2020   Fatigue 10/05/2020   Hypercalcemia 10/03/2020   Swallowing difficulty 10/03/2020   Hypophosphatemia    Hypomagnesemia    Hypokalemia    B12 deficiency    Acute lower UTI 06/21/2020   Acute metabolic encephalopathy 06/21/2020   Hyponatremia 06/21/2020   H/O sepsis 12/09/2019   CKD (chronic kidney disease) 11/23/2019   Leukocytosis 11/23/2019   Mobility impaired 10/13/2019   Incontinence of urine 10/12/2019   Grief reaction 03/27/2019   Elevated serum creatinine 06/13/2018   Dysuria 06/13/2018   Hearing loss 01/02/2018   Subclinical hypothyroidism 10/26/2017   Anxiety and depression 08/24/2016   Falls frequently 08/24/2016   Generalized weakness 08/24/2016   Routine general medical examination at a health care facility 05/16/2015   Breast calcifications on mammogram 03/01/2014   Encounter for Medicare annual wellness exam 05/30/2013   Breast calcification seen on mammogram 12/14/2012   Breast calcification, left 11/28/2012   History of falling 11/27/2012   Poor balance 11/27/2012   Colon cancer screening 05/10/2012   Osteoporosis 06/21/2007   DM (diabetes mellitus), type 2, uncontrolled (HCC) 09/20/2006   Type 2 diabetes mellitus with hyperlipidemia (HCC) 09/20/2006   Essential hypertension 09/20/2006   Osteoarthritis 09/20/2006    Past Surgical History:  Procedure Laterality Date   ANKLE SURGERY  left medial malleblus fracture   CHOLECYSTECTOMY      Prior to Admission medications   Medication Sig Start Date End Date Taking? Authorizing Provider  amLODipine (NORVASC) 10 MG tablet TAKE 1 TABLET(10 MG) BY MOUTH DAILY 07/01/20   Tower, Audrie Gallus, MD  aspirin 81 MG EC tablet Take 81 mg by mouth daily.    [provider]  buPROPion (WELLBUTRIN XL) 300 MG 24 hr tablet TAKE 1 TABLET(300 MG) BY MOUTH DAILY 07/30/20    Tower, Audrie Gallus, MD  cephALEXin (KEFLEX) 500 MG capsule Take 1 capsule (500 mg total) by mouth 4 (four) times daily. 09/04/20   Jacalyn Lefevre, MD  fluconazole (DIFLUCAN) 100 MG tablet Take one pill by mouth every other day for 3 doses Patient not taking: Reported on 10/03/2020 07/07/20   Tower, Audrie Gallus, MD  FLUoxetine (PROZAC) 40 MG capsule TAKE 1 CAPSULE(40 MG) BY MOUTH DAILY 07/01/20   Tower, Idamae Schuller A, MD  glipiZIDE (GLUCOTROL XL) 10 MG 24 hr tablet TAKE 1 TABLET(10 MG) BY MOUTH DAILY 07/01/20   Tower, Audrie Gallus, MD  Insulin Pen Needle 29G X MISC Use with insulin daily 01/22/20   Tower, Audrie Gallus, MD  losartan-hydrochlorothiazide (HYZAAR) 100-25 MG tablet Take 0.5 tablets by mouth daily. 04/11/20   Tower, Audrie Gallus, MD  pantoprazole (PROTONIX) 40 MG tablet Take 1 tablet (40 mg total) by mouth daily. 06/26/20 09/29/20  Tresa Moore, MD  raloxifene (EVISTA) 60 MG tablet TAKE 1 TABLET(60 MG) BY MOUTH DAILY 07/30/20   Tower, Audrie Gallus, MD  Semaglutide,0.25 or 0.5MG /DOS, (OZEMPIC, 0.25 OR 0.5 MG/DOSE,) 2 MG/1.5ML SOPN Inject 0.25 mg into the skin once a week. 02/11/20   Tower, Audrie Gallus, MD  simvastatin (ZOCOR) 20 MG tablet TAKE 1 TABLET(20 MG) BY MOUTH DAILY 07/30/20   Tower, Audrie Gallus, MD    Allergies Alendronate sodium  Family History  Problem Relation Age of Onset   Diabetes Mother    Hypertension Mother    Asthma Father    Hypertension Sister    Cancer Sister        lung   Asthma Brother    COPD Brother    Cancer Maternal Grandmother        breast    Asthma Brother    Diabetes Mellitus II Daughter    COPD Daughter     Social History Social History   Tobacco Use   Smoking status: Never   Smokeless tobacco: Never  Vaping Use   Vaping Use: Never used  Substance Use Topics   Alcohol use: No    Alcohol/week: 0.0 standard drinks   Drug use: No    Review of Systems  Review of Systems  Constitutional:  Negative for chills and fever.  HENT:  Negative for sore throat.   Eyes:  Negative  for pain.  Respiratory:  Negative for cough and stridor.   Cardiovascular:  Negative for chest pain.  Gastrointestinal:  Negative for vomiting.  Musculoskeletal:  Positive for back pain (chronic).  Skin:  Negative for rash.  Neurological:  Positive for weakness. Negative for seizures, loss of consciousness and headaches.  Psychiatric/Behavioral:  Negative for suicidal ideas.   All other systems reviewed and are negative.    ____________________________________________   PHYSICAL EXAM:  VITAL SIGNS: ED Triage Vitals  Enc Vitals Group     BP 10/21/20 1812 (!) 160/45     Pulse Rate 10/21/20 1812 79     Resp 10/21/20 1812 18  Temp 10/21/20 1812 98.8 F (37.1 C)     Temp src --      SpO2 10/21/20 1812 98 %     Weight --      Height --      Head Circumference --      Peak Flow --      Pain Score 10/21/20 1808 8     Pain Loc --      Pain Edu? --      Excl. in GC? --    Vitals:   10/21/20 1812 10/21/20 2019  BP: (!) 160/45 (!) 155/60  Pulse: 79 82  Resp: 18 20  Temp: 98.8 F (37.1 C) 98.9 F (37.2 C)  SpO2: 98% 99%   Physical Exam Vitals and nursing note reviewed.  Constitutional:      General: She is not in acute distress.    Appearance: She is well-developed.  HENT:     Head: Normocephalic and atraumatic.     Right Ear: External ear normal.     Left Ear: External ear normal.     Nose: Nose normal.  Eyes:     Conjunctiva/sclera: Conjunctivae normal.  Cardiovascular:     Rate and Rhythm: Normal rate and regular rhythm.     Heart sounds: No murmur heard. Pulmonary:     Effort: Pulmonary effort is normal. No respiratory distress.     Breath sounds: Normal breath sounds.  Abdominal:     Palpations: Abdomen is soft.     Tenderness: There is no abdominal tenderness.  Musculoskeletal:     Cervical back: Neck supple.  Skin:    General: Skin is warm and dry.     Capillary Refill: Capillary refill takes less than 2 seconds.  Neurological:     Mental Status:  She is alert and oriented to person, place, and time.    Oriented to date and year and recent events.  Symmetric strength in upper and lower extremities.  Cranial nerves II through XII grossly intact.  Stage I sacral wound with some erythema without induration, warmth, edema, fluctuance or drainage or other surrounding skin changes. ____________________________________________   LABS (all labs ordered are listed, but only abnormal results are displayed)  Labs Reviewed  BASIC METABOLIC PANEL - Abnormal; Notable for the following components:      Result Value   Sodium 133 (*)    Potassium 3.4 (*)    Glucose, Bld 155 (*)    Calcium 8.7 (*)    All other components within normal limits  URINALYSIS, COMPLETE (UACMP) WITH MICROSCOPIC  CBC  HEPATIC FUNCTION PANEL  MAGNESIUM  CK  CBG MONITORING, ED  TROPONIN I (HIGH SENSITIVITY)  TROPONIN I (HIGH SENSITIVITY)   ____________________________________________  EKG  Sinus rhythm with a ventricular rate of 83, left axis deviation, QTc interval of 505, some nonspecific changes in the anterior leads and lateral leads without other clear evidence of acute ischemia or significant arrhythmia. ____________________________________________  RADIOLOGY  ED MD interpretation: CT head shows evidence of prior noted CVA without any other subacute or acute changes no evidence of intracranial hemorrhage mass-effect or other clear acute intracranial process.  CT abdomen pelvis shows mild diverticulitis of the sigmoid colon without abscess or perforation.  There is also some mild thickening at the posterior gluteal region.  No evidence of kidney stone, pyelonephritis, appendicitis, or other acute abdominal pelvic process or spinal process.  Chest x-ray without clear focal consolidation, large effusion, significant edema, pneumothorax or other clear acute process.  Nonspecific opacity in left upper lobe can be followed up with imaging in 3 to 4  weeks.  Official radiology report(s): CT ABDOMEN PELVIS WO CONTRAST  Result Date: 10/21/2020 CLINICAL DATA:  Abdominal pain, weakness, gluteal ulcer EXAM: CT ABDOMEN AND PELVIS WITHOUT CONTRAST TECHNIQUE: Multidetector CT imaging of the abdomen and pelvis was performed following the standard protocol without IV contrast. COMPARISON:  11/23/2019 FINDINGS: Lower chest: Lung bases are clear. Hepatobiliary: Unenhanced liver is unremarkable. Status post cholecystectomy. No intrahepatic or extrahepatic ductal dilatation. Pancreas: Within normal limits. Spleen: Within normal limits. Adrenals/Urinary Tract: Mild thickening of the left adrenal gland. Right adrenal gland is within normal limits. Mild scarring/atrophy of the left lower kidney. Right kidney is within normal limits. Punctate nonobstructing interpolar right renal calculus (series 2/image 27). Additional bilateral renal vascular calcifications. No hydronephrosis. Mildly thick-walled bladder. Stomach/Bowel: Stomach is notable for a large hiatal hernia. No evidence of bowel obstruction. Normal appendix (series 2/image 52). Sigmoid diverticulosis with mild pericolonic stranding in the left lower quadrant (series 2/image 57), at least raising the possibility of mild sigmoid diverticulitis. No drainable fluid collection/abscess.  No free air. Vascular/Lymphatic: No evidence of abdominal aortic aneurysm. Atherosclerotic calcifications of the abdominal aorta and branch vessels. No suspicious abdominopelvic lymphadenopathy. Reproductive: Uterus and bilateral ovaries are within normal limits. Other: No abdominopelvic ascites. Musculoskeletal: Visualized osseous structures are within normal limits. Very mild cutaneous thickening along the left posterior gluteal region (series 2/image 86), possibly at the site of the patient's known pressure ulcer. IMPRESSION: Suspected mild sigmoid diverticulitis. No drainable fluid collection/abscess. No free air. Mild cutaneous  thickening in the left posterior gluteal region, possibly corresponding to the patient's known pressure ulcer. Additional ancillary findings as above. Electronically Signed   By: Charline Bills M.D.   On: 10/21/2020 21:11   CT Head Wo Contrast  Result Date: 10/21/2020 CLINICAL DATA:  Altered mental status EXAM: CT HEAD WITHOUT CONTRAST TECHNIQUE: Contiguous axial images were obtained from the base of the skull through the vertex without intravenous contrast. COMPARISON:  09/04/2020 FINDINGS: Brain: No evidence of acute infarction, hemorrhage, hydrocephalus, extra-axial collection or mass lesion/mass effect. Subcortical white matter and periventricular small vessel ischemic changes. Chronic left basal ganglia lacunar infarct. Mild cortical atrophy, likely age appropriate. Vascular: Intracranial atherosclerosis. Skull: Normal. Negative for fracture or focal lesion. Sinuses/Orbits: The visualized paranasal sinuses are essentially clear. The mastoid air cells are unopacified. Other: None. IMPRESSION: No evidence of acute intracranial abnormality. Atrophy with small vessel ischemic changes. Chronic left basal ganglia lacunar infarct. Electronically Signed   By: Charline Bills M.D.   On: 10/21/2020 20:57   DG Chest Portable 1 View  Result Date: 10/21/2020 CLINICAL DATA:  Weakness.  Back pain.  Recovering from COVID. EXAM: PORTABLE CHEST 1 VIEW.  Patient is rotated. COMPARISON:  CT abdomen pelvis 10/21/2020, chest x-ray 09/04/2020 FINDINGS: The heart size and mediastinal contours are within normal limits. Aortic calcifications. Elevated left hemidiaphragm. Hazy airspace opacity along the peripheral left upper lobe likely due to overlying apparatus. Otherwise no focal consolidation. No pulmonary edema. No pleural effusion. No pneumothorax. No acute osseous abnormality. IMPRESSION: Hazy airspace opacity along the peripheral left upper lobe likely due to overlying apparatus. Followup PA and lateral chest X-ray  is recommended in 3-4 weeks to ensure resolution. Electronically Signed   By: Tish Frederickson M.D.   On: 10/21/2020 21:14    ____________________________________________   PROCEDURES  Procedure(s) performed (including Critical Care):  Procedures   ____________________________________________   INITIAL IMPRESSION / ASSESSMENT AND  PLAN / ED COURSE      Patient presents with above-stated history exam for assessment of sacral wound noticed by family who takes care of patient at home and some back discomfort.  On arrival patient is slightly hypertensive with otherwise stable vital signs on room air.  She does have this appears to be very mild stage I sacral wound.  No other midline tenderness or other overlying skin changes over the back.  Her abdomen is soft nontender.  She is oriented and has a nonfocal supine neuro exam although generally seems very weak.  Cranial nerves are unremarkable.  With regard to wound noted by family with symptoms consistent with pressure related stage I sacral wound.  There is some mild erythema but no drainage fluctuance induration warmth or significant tenderness to suggest cellulitis or abscess or deeper space infection at this time.  Patient is a very poor historian and given a history of trauma with no other obvious source of reported severe pain will obtain a CT abdomen pelvis as well to rule out  CT abdomen pelvis shows mild diverticulitis of the sigmoid colon without abscess or perforation.  There is also some mild thickening at the posterior gluteal region.  No evidence of kidney stone, pyelonephritis, appendicitis, or other acute abdominal pelvic process or spinal process.  Patient has a relatively nonfocal supine neuro exam unclear etiology for her fairly significant overall weakness and inability ambulate over the last couple weeks.  Given she had a head CT that weeks ago that showed a age-indeterminate CVA and she is a very poor historian will obtain CT  today to see if there is any interim development of any additional CVA.  CT head shows evidence of prior noted CVA without any other subacute or acute changes no evidence of intracranial hemorrhage mass-effect or other clear acute intracranial process.  I suspect patient may have had a stroke possibly early last month and the month before do not think she has had any acute CVA.  Chest x-ray without clear focal consolidation, large effusion, significant edema, pneumothorax or other clear acute process.  Nonspecific opacity in left upper lobe can be followed up with imaging in 3 to 4 weeks.  BMP remarkable for K of 3.4 without other significant electrolyte or metabolic derangements.  Care patient signed over to Dr. Juliette Alcide at approximately 10 PM.  Plan is to follow-up remaining labs including CBC, hepatic function panel, CK, troponin and UA.  If these are otherwise reassuring I think patient would likely be stable for discharge on antibiotics with close outpatient PCP follow-up.  I did attempt to update daughter on interval findings of diverticulitis but was able to reach daughter x2 attempts.  ____________________________________________   FINAL CLINICAL IMPRESSION(S) / ED DIAGNOSES  Final diagnoses:  Diverticulitis  Wound of sacral region, initial encounter  Hypokalemia    Medications  potassium chloride SA (KLOR-CON) CR tablet 40 mEq (has no administration in time range)  lidocaine (LIDODERM) 5 % 1 patch (has no administration in time range)     ED Discharge Orders     None        Note:  This document was prepared using Dragon voice recognition software and may include unintentional dictation errors.    Gilles Chiquito, MD 10/21/20 2158

## 2020-10-21 NOTE — Discharge Instructions (Addendum)
1.  Take Augmentin twice daily x7 days. 2.  Return to the ER for worsening symptoms, persistent vomiting, difficulty breathing or other concerns.

## 2020-10-21 NOTE — ED Notes (Signed)
Pt in room. Pt on pulse ox monitor and bp monitor. Pt states she does not know why she is here. First nurse states it is due to weakness. Pt denies pain at this time. Pt noted to have pressure ulcer to the buttock. Provider notified and stated he is aware and saw it and says it appears to be a stage 1.  RN called lab to see if they can add on the blood work. Lab stated they will try, but if they cannot they will call me and let me know.

## 2020-10-21 NOTE — Telephone Encounter (Signed)
Patient's daughter [name] had not called the office back. I called [name] again and was advised that her mom has been in a lot of pain this week. [name] stated that her mom's pain is at the point where she has told the caregiver to call 911 and they are on the way to the patient's home to take her to the ER. [name] stated that her mom's bottom is severely breaking down. [name] stated that her mom is complaining of back pain where her kidneys are and she has kidney disease. [name] stated that her mom has been in the hospital bed for several weeks now. [name] was advised that this message will go back to Dr. Milinda Antis.

## 2020-10-21 NOTE — ED Triage Notes (Addendum)
Pt comes with c/o back pain. EMS reports pt has been recovering from COVID and laying in bed. Pt does have bed sore to upper buttocks. Bandage in place no noted drainage or odor.  VSS  Pt states weakness and has been laying in the bed for the last few weeks.

## 2020-10-21 NOTE — Telephone Encounter (Signed)
Message was left on voicemail by Darlen Round (not on DPR) stating that she is the patient's caregiver this week. Burna Mortimer stated that the patient's daughter sent a my chart message yesterday requesting pain medication for her mom. Burna Mortimer stated that she just knows that the patient is in pain.  Called and left a message for patient's daughter to call the office back to get more information.

## 2020-10-21 NOTE — Telephone Encounter (Signed)
She most definitely needs to go to the ER now  Aware, will watch for correspondence

## 2020-10-22 DIAGNOSIS — Z7401 Bed confinement status: Secondary | ICD-10-CM | POA: Diagnosis not present

## 2020-10-22 DIAGNOSIS — M2550 Pain in unspecified joint: Secondary | ICD-10-CM | POA: Diagnosis not present

## 2020-10-22 DIAGNOSIS — I10 Essential (primary) hypertension: Secondary | ICD-10-CM | POA: Diagnosis not present

## 2020-10-22 LAB — HEPATIC FUNCTION PANEL
ALT: 93 U/L — ABNORMAL HIGH (ref 0–44)
AST: 54 U/L — ABNORMAL HIGH (ref 15–41)
Albumin: 2.5 g/dL — ABNORMAL LOW (ref 3.5–5.0)
Alkaline Phosphatase: 80 U/L (ref 38–126)
Bilirubin, Direct: 0.2 mg/dL (ref 0.0–0.2)
Indirect Bilirubin: 0.6 mg/dL (ref 0.3–0.9)
Total Bilirubin: 0.8 mg/dL (ref 0.3–1.2)
Total Protein: 6.4 g/dL — ABNORMAL LOW (ref 6.5–8.1)

## 2020-10-22 LAB — CK: Total CK: 21 U/L — ABNORMAL LOW (ref 38–234)

## 2020-10-22 LAB — TROPONIN I (HIGH SENSITIVITY)
Troponin I (High Sensitivity): 31 ng/L — ABNORMAL HIGH (ref <18)
Troponin I (High Sensitivity): 35 ng/L — ABNORMAL HIGH (ref <18)

## 2020-10-22 LAB — MAGNESIUM: Magnesium: 1.1 mg/dL — ABNORMAL LOW (ref 1.7–2.4)

## 2020-10-22 MED ORDER — FOSFOMYCIN TROMETHAMINE 3 G PO PACK: 3.0000 g | PACK | Freq: Once | ORAL | Status: DC

## 2020-10-22 MED ORDER — MAGNESIUM SULFATE 2 GM/50ML IV SOLN
2.0000 g | Freq: Once | INTRAVENOUS | Status: AC
  Administered 2020-10-22: 2 g via INTRAVENOUS
  Filled 2020-10-22: qty 50

## 2020-10-22 MED ORDER — AMOXICILLIN-POT CLAVULANATE 875-125 MG PO TABS: 1.0000 | ORAL_TABLET | Freq: Once | ORAL | Status: DC

## 2020-10-22 MED ORDER — AMOXICILLIN-POT CLAVULANATE 400-57 MG/5ML PO SUSR
600.0000 mg | Freq: Once | ORAL | Status: AC
  Administered 2020-10-22: 600 mg via ORAL
  Filled 2020-10-22: qty 7.5

## 2020-10-22 MED ORDER — AMOXICILLIN-POT CLAVULANATE 600-42.9 MG/5ML PO SUSR: 600.0000 mg | Freq: Two times a day (BID) | ORAL | 0 refills | Status: AC

## 2020-10-22 NOTE — ED Notes (Signed)
Dr. Dolores Frame at the bedside speaking with pt's daughter

## 2020-10-22 NOTE — ED Notes (Signed)
Patient's DNR form left in the ED. Spoke with other daughter, [name]. Requests that the form be mailed to home address. Correct address verified with daughter.

## 2020-10-22 NOTE — ED Notes (Signed)
Spoke to Dr. Dolores Frame regarding amoxicillin, will change order to liquid due to difficulty swallowing large pills

## 2020-10-22 NOTE — ED Provider Notes (Signed)
-----------------------------------------   12:55 AM on 10/22/2020 -----------------------------------------  Repeat troponin trending down; rest of labs unremarkable.  Daughter at bedside; updated her of all test and imaging results.  Will change oral Augmentin to liquid as patient tolerates this better.  Patient is very eager to go home but agrees to stay for IV magnesium.  Strict return precautions given.  Patient and daughter verbalized understanding agree with plan of care.   Irean Hong, MD 10/22/20 [phone]

## 2020-10-22 NOTE — ED Notes (Signed)
Pts brief soiled with BM. Pt cleaned with new brief in place. Pt transported back to home by EMS transport.

## 2020-10-23 ENCOUNTER — Telehealth: Payer: Self-pay

## 2020-10-23 NOTE — Chronic Care Management (AMB) (Addendum)
Chronic Care Management Pharmacy Assistant   Name: Amanda Benson  MRN: [id number] DOB: 1935/11/05   Reason for Encounter: Medication Adherence and Delivery Coordination   Recent office visits:  10/03/20 - Dr.Tower PCP - Will continue holding the metoprolol in light of bradycardia and hold vitamin D  Recent consult visits:  None since last CCM contact  Hospital visits:  10/21/20 [facility] ED visit Diverticulitis - start Oral Augmentin 600mg  take every 12 hours   Medications: Outpatient Encounter Medications as of 10/23/2020  Medication Sig   amLODipine (NORVASC) 10 MG tablet TAKE 1 TABLET(10 MG) BY MOUTH DAILY   amoxicillin-clavulanate (AUGMENTIN ES-600) 600-42.9 MG/5ML suspension Take 5 mLs (600 mg total) by mouth every 12 (twelve) hours for 7 days.   aspirin 81 MG EC tablet Take 81 mg by mouth daily.   buPROPion (WELLBUTRIN XL) 300 MG 24 hr tablet TAKE 1 TABLET(300 MG) BY MOUTH DAILY   cephALEXin (KEFLEX) 500 MG capsule Take 1 capsule (500 mg total) by mouth 4 (four) times daily.   fluconazole (DIFLUCAN) 100 MG tablet Take one pill by mouth every other day for 3 doses (Patient not taking: Reported on 10/03/2020)   FLUoxetine (PROZAC) 40 MG capsule TAKE 1 CAPSULE(40 MG) BY MOUTH DAILY   glipiZIDE (GLUCOTROL XL) 10 MG 24 hr tablet TAKE 1 TABLET(10 MG) BY MOUTH DAILY   Insulin Pen Needle 29G X 12/04/2020 MISC Use with insulin daily   losartan-hydrochlorothiazide (HYZAAR) 100-25 MG tablet Take 0.5 tablets by mouth daily.   pantoprazole (PROTONIX) 40 MG tablet Take 1 tablet (40 mg total) by mouth daily.   raloxifene (EVISTA) 60 MG tablet TAKE 1 TABLET(60 MG) BY MOUTH DAILY   Semaglutide,0.25 or 0.5MG /DOS, (OZEMPIC, 0.25 OR 0.5 MG/DOSE,) 2 MG/1.5ML SOPN Inject 0.25 mg into the skin once a week.   simvastatin (ZOCOR) 20 MG tablet TAKE 1 TABLET(20 MG) BY MOUTH DAILY   No facility-administered encounter medications on file as of 10/23/2020.   BP Readings from Last 3 Encounters:  10/22/20 (!)  148/59  10/03/20 (!) 146/68  09/29/20 (!) 143/36    Lab Results  Component Value Date   HGBA1C 8.0 (H) 06/21/2020      Last adherence delivery date:10/07/2020      Patient is due for next adherence delivery on: 11/05/2020  Spoke with patient on 10/28/20 reviewed medications and coordinated delivery. Spoke with patient's daughter.  This delivery to include: Adherence Packaging  30 Days  Packs: Aspirin 81mg -1 tablet daily (1 evening meal) Multivitamin (Women's One A Day) - 2 gummies daily (2 breakfast) Losartan 50mg  - 1 tablet daily (1 breakfast) Hydrochlorothiazide 12.5 mg- 1 tablet (1 breakfast) Amlodipine 10mg - 1 tablet daily (1 breakfast) Fluoxetine 40mg  - 1 capsule daily (1 breakfast) Bupropion 300mg  24 hr-1 tablet daily (1 breakfast) Simvastatin 20mg -1 tablet daily (1 evening meal) Raloxifene 60mg -1 tablet daily (1 breakfast)  VIAL medications: Glipizide 10mg  XL - 1 tablet daily (1 breakfast - Hold for low BG)  Patient declined the following medications this month: Ozempic Inj 2/1.5 ML inject 0.25 mg into skin once a week - Getting from patient assistance Calcium/Vitamin D (800 units Vit D/600mg  Calcium)- 1 tablet daily (1 evening meal) - on hold per PCP Metoprolol Succinate 25mg - 1 tablet daily (1 evening meal) - on hold per PCP   No refill request needed from PCP.  Confirmed delivery date of 11/05/20, advised patient that pharmacy will contact them the morning of delivery.  No BP or BG readings available.   Pernell Dupre, CPP notified  Burt Knack, [facility] Clincal Pharmacy Assistant [phone]  I have reviewed the care management and care coordination activities outlined in this encounter and I am certifying that I agree with the content of this note. Recommended putting glipizide in a vial in case of low sugars - this may be discontinued soon.  Phil Dopp, PharmD Clinical Pharmacist [facility] at [facility] [phone]

## 2020-10-29 ENCOUNTER — Telehealth: Payer: Self-pay

## 2020-10-29 DIAGNOSIS — N1832 Chronic kidney disease, stage 3b: Secondary | ICD-10-CM | POA: Diagnosis not present

## 2020-10-29 DIAGNOSIS — L89151 Pressure ulcer of sacral region, stage 1: Secondary | ICD-10-CM | POA: Diagnosis not present

## 2020-10-29 DIAGNOSIS — I69351 Hemiplegia and hemiparesis following cerebral infarction affecting right dominant side: Secondary | ICD-10-CM | POA: Diagnosis not present

## 2020-10-29 DIAGNOSIS — I69328 Other speech and language deficits following cerebral infarction: Secondary | ICD-10-CM | POA: Diagnosis not present

## 2020-10-29 DIAGNOSIS — E1122 Type 2 diabetes mellitus with diabetic chronic kidney disease: Secondary | ICD-10-CM | POA: Diagnosis not present

## 2020-10-29 DIAGNOSIS — I69391 Dysphagia following cerebral infarction: Secondary | ICD-10-CM | POA: Diagnosis not present

## 2020-10-29 DIAGNOSIS — I130 Hypertensive heart and chronic kidney disease with heart failure and stage 1 through stage 4 chronic kidney disease, or unspecified chronic kidney disease: Secondary | ICD-10-CM | POA: Diagnosis not present

## 2020-10-29 DIAGNOSIS — I5033 Acute on chronic diastolic (congestive) heart failure: Secondary | ICD-10-CM | POA: Diagnosis not present

## 2020-10-29 DIAGNOSIS — E1151 Type 2 diabetes mellitus with diabetic peripheral angiopathy without gangrene: Secondary | ICD-10-CM | POA: Diagnosis not present

## 2020-10-29 NOTE — Telephone Encounter (Signed)
Nurse came out to house today to do eval. Appt scheduled with Dr. Selena Batten to address cough, daughter said that her bottom is starting to feel better since they have been using medicated pads on it.

## 2020-10-29 NOTE — Telephone Encounter (Signed)
Addressed through mychart message

## 2020-10-29 NOTE — Telephone Encounter (Addendum)
Left VM requesting pt's daughter to call the office back, spoke with Ashtyn and the home health agency was having issues processing her information but it has been resolved and they are going to call pt's family to start services soon.

## 2020-10-29 NOTE — Telephone Encounter (Signed)
F/u  Returning call back to nurse.  

## 2020-10-30 ENCOUNTER — Telehealth (INDEPENDENT_AMBULATORY_CARE_PROVIDER_SITE_OTHER): Payer: Medicare HMO | Admitting: Family Medicine

## 2020-10-30 ENCOUNTER — Encounter: Payer: Self-pay | Admitting: Family Medicine

## 2020-10-30 VITALS — BP 152/70 | HR 74 | Temp 98.1°F

## 2020-10-30 DIAGNOSIS — R059 Cough, unspecified: Secondary | ICD-10-CM

## 2020-10-30 MED ORDER — BENZONATATE 100 MG PO CAPS: 100.0000 mg | ORAL_CAPSULE | Freq: Three times a day (TID) | ORAL | 0 refills | Status: DC | PRN

## 2020-10-30 NOTE — Progress Notes (Signed)
Virtual Visit via Video Note  I connected with Amanda Benson  on 10/30/20 at 11:20 AM EDT by a video enabled telemedicine application and verified that I am speaking with the correct person using two identifiers.  Location patient: home, [place] Location provider:work or home office Persons participating in the virtual visit: patient, provider, patient's daughter  I discussed the limitations of evaluation and management by telemedicine and the availability of in person appointments. The patient expressed understanding and agreed to proceed.   HPI:  Acute telemedicine visit for a cough: -Onset: had covid 2 weeks ago, she was doing well and had very few symptoms, cough started about 1 week ago -Symptoms include:cough - sometimes productive of clear mucus - seems like is in the throat -Denies:fevers, CP, SOB, vomiting, diarrhea, malaise, inability to eat/drink/chronically bed bound -Has tried:nothing -Pertinent past medical history: see below -Pertinent medication allergies: Allergies  Allergen Reactions   Alendronate Sodium     REACTION: heartburn  -COVID-19 vaccine status: vaccinated x2  ROS: See pertinent positives and negatives per HPI.  Past Medical History:  Diagnosis Date   Depression    Diabetes mellitus    type II   Hyperlipidemia    Hypertension    Obesity    Osteoporosis     Past Surgical History:  Procedure Laterality Date   ANKLE SURGERY     left medial malleblus fracture   CHOLECYSTECTOMY       Current Outpatient Medications:    amLODipine (NORVASC) 10 MG tablet, TAKE 1 TABLET(10 MG) BY MOUTH DAILY, Disp: 90 tablet, Rfl: 1   aspirin 81 MG EC tablet, Take 81 mg by mouth daily., Disp: , Rfl:    benzonatate (TESSALON PERLES) 100 MG capsule, Take 1 capsule (100 mg total) by mouth 3 (three) times daily as needed., Disp: 20 capsule, Rfl: 0   buPROPion (WELLBUTRIN XL) 300 MG 24 hr tablet, TAKE 1 TABLET(300 MG) BY MOUTH DAILY, Disp: 90 tablet, Rfl: 1   FLUoxetine (PROZAC)  40 MG capsule, TAKE 1 CAPSULE(40 MG) BY MOUTH DAILY, Disp: 90 capsule, Rfl: 1   glipiZIDE (GLUCOTROL XL) 10 MG 24 hr tablet, TAKE 1 TABLET(10 MG) BY MOUTH DAILY, Disp: 90 tablet, Rfl: 1   Insulin Pen Needle 29G X MISC, Use with insulin daily, Disp: 90 each, Rfl: 0   losartan-hydrochlorothiazide (HYZAAR) 100-25 MG tablet, Take 0.5 tablets by mouth daily., Disp: 45 tablet, Rfl: 3   raloxifene (EVISTA) 60 MG tablet, TAKE 1 TABLET(60 MG) BY MOUTH DAILY, Disp: 90 tablet, Rfl: 1   Semaglutide,0.25 or 0.5MG /DOS, (OZEMPIC, 0.25 OR 0.5 MG/DOSE,) 2 MG/1.5ML SOPN, Inject 0.25 mg into the skin once a week., Disp: 1.5 mL, Rfl: 3   simvastatin (ZOCOR) 20 MG tablet, TAKE 1 TABLET(20 MG) BY MOUTH DAILY, Disp: 90 tablet, Rfl: 1  EXAM:  VITALS per patient if applicable:  GENERAL: alert, oriented, appears well and in no acute distress  HEENT: atraumatic, conjunttiva clear, no obvious abnormalities on inspection of external nose and ears  NECK: normal movements of the head and neck  LUNGS: on inspection no signs of respiratory distress, breathing rate appears normal, no obvious gross SOB, gasping or wheezing  CV: no obvious cyanosis  MS: moves all visible extremities without noticeable abnormality  PSYCH/NEURO: pleasant and cooperative, no obvious depression or anxiety, speech and thought processing grossly intact  ASSESSMENT AND PLAN:  Discussed the following assessment and plan:  Cough  -we discussed possible serious and likely etiologies, options for evaluation and workup, limitations of telemedicine  visit vs in person visit, treatment, treatment risks and precautions. Pt prefers to treat via telemedicine empirically rather than in person at this moment. Query postviral, new VURI, bronchitis vs other. Opted for close observation for worsening, tessalon and throat lozenges for the cough and they agree to seek prompt follow up with PCP or in person care if worsening, new symptoms arise, or if is  not improving with treatment. Discussed options for inperson care if PCP office not available. Did let this patient know that I only Benson telemedicine on Tuesdays and Thursdays for [place]. Advised to schedule follow up visit with PCP or [facility] if any further questions or concerns to avoid delays in care.   I discussed the assessment and treatment plan with the patient. The patient was provided an opportunity to ask questions and all were answered. The patient agreed with the plan and demonstrated an understanding of the instructions.     Amanda Benson

## 2020-10-30 NOTE — Patient Instructions (Signed)
-  I sent the medication(s) we discussed to your pharmacy: Meds ordered this encounter  Medications   benzonatate (TESSALON PERLES) 100 MG capsule    Sig: Take 1 capsule (100 mg total) by mouth 3 (three) times daily as needed.    Dispense:  20 capsule    Refill:  0     I hope you are feeling better soon!  Seek in person care promptly if your symptoms worsen, new concerns arise or you are not improving with treatment.  It was nice to meet you today. I help [facility] out with telemedicine visits on Tuesdays and Thursdays and am available for visits on those days. If you have any concerns or questions following this visit please schedule a follow up visit with your Primary Care doctor or seek care at a local urgent care clinic to avoid delays in care.   

## 2020-10-31 DIAGNOSIS — I69351 Hemiplegia and hemiparesis following cerebral infarction affecting right dominant side: Secondary | ICD-10-CM | POA: Diagnosis not present

## 2020-10-31 DIAGNOSIS — E1151 Type 2 diabetes mellitus with diabetic peripheral angiopathy without gangrene: Secondary | ICD-10-CM | POA: Diagnosis not present

## 2020-10-31 DIAGNOSIS — N1832 Chronic kidney disease, stage 3b: Secondary | ICD-10-CM | POA: Diagnosis not present

## 2020-10-31 DIAGNOSIS — L89151 Pressure ulcer of sacral region, stage 1: Secondary | ICD-10-CM | POA: Diagnosis not present

## 2020-10-31 DIAGNOSIS — E1122 Type 2 diabetes mellitus with diabetic chronic kidney disease: Secondary | ICD-10-CM | POA: Diagnosis not present

## 2020-10-31 DIAGNOSIS — I69391 Dysphagia following cerebral infarction: Secondary | ICD-10-CM | POA: Diagnosis not present

## 2020-10-31 DIAGNOSIS — I69328 Other speech and language deficits following cerebral infarction: Secondary | ICD-10-CM | POA: Diagnosis not present

## 2020-10-31 DIAGNOSIS — I5033 Acute on chronic diastolic (congestive) heart failure: Secondary | ICD-10-CM | POA: Diagnosis not present

## 2020-10-31 DIAGNOSIS — I130 Hypertensive heart and chronic kidney disease with heart failure and stage 1 through stage 4 chronic kidney disease, or unspecified chronic kidney disease: Secondary | ICD-10-CM | POA: Diagnosis not present

## 2020-11-05 DIAGNOSIS — I69391 Dysphagia following cerebral infarction: Secondary | ICD-10-CM | POA: Diagnosis not present

## 2020-11-05 DIAGNOSIS — E1151 Type 2 diabetes mellitus with diabetic peripheral angiopathy without gangrene: Secondary | ICD-10-CM | POA: Diagnosis not present

## 2020-11-05 DIAGNOSIS — N1832 Chronic kidney disease, stage 3b: Secondary | ICD-10-CM | POA: Diagnosis not present

## 2020-11-05 DIAGNOSIS — L89151 Pressure ulcer of sacral region, stage 1: Secondary | ICD-10-CM | POA: Diagnosis not present

## 2020-11-05 DIAGNOSIS — I130 Hypertensive heart and chronic kidney disease with heart failure and stage 1 through stage 4 chronic kidney disease, or unspecified chronic kidney disease: Secondary | ICD-10-CM | POA: Diagnosis not present

## 2020-11-05 DIAGNOSIS — I69351 Hemiplegia and hemiparesis following cerebral infarction affecting right dominant side: Secondary | ICD-10-CM | POA: Diagnosis not present

## 2020-11-05 DIAGNOSIS — I69328 Other speech and language deficits following cerebral infarction: Secondary | ICD-10-CM | POA: Diagnosis not present

## 2020-11-05 DIAGNOSIS — E1122 Type 2 diabetes mellitus with diabetic chronic kidney disease: Secondary | ICD-10-CM | POA: Diagnosis not present

## 2020-11-05 DIAGNOSIS — I5033 Acute on chronic diastolic (congestive) heart failure: Secondary | ICD-10-CM | POA: Diagnosis not present

## 2020-11-06 ENCOUNTER — Telehealth: Payer: Self-pay | Admitting: Family Medicine

## 2020-11-06 DIAGNOSIS — I69351 Hemiplegia and hemiparesis following cerebral infarction affecting right dominant side: Secondary | ICD-10-CM | POA: Diagnosis not present

## 2020-11-06 DIAGNOSIS — I5033 Acute on chronic diastolic (congestive) heart failure: Secondary | ICD-10-CM | POA: Diagnosis not present

## 2020-11-06 DIAGNOSIS — E1122 Type 2 diabetes mellitus with diabetic chronic kidney disease: Secondary | ICD-10-CM | POA: Diagnosis not present

## 2020-11-06 DIAGNOSIS — I69328 Other speech and language deficits following cerebral infarction: Secondary | ICD-10-CM | POA: Diagnosis not present

## 2020-11-06 DIAGNOSIS — L89151 Pressure ulcer of sacral region, stage 1: Secondary | ICD-10-CM | POA: Diagnosis not present

## 2020-11-06 DIAGNOSIS — N1832 Chronic kidney disease, stage 3b: Secondary | ICD-10-CM | POA: Diagnosis not present

## 2020-11-06 DIAGNOSIS — E1151 Type 2 diabetes mellitus with diabetic peripheral angiopathy without gangrene: Secondary | ICD-10-CM | POA: Diagnosis not present

## 2020-11-06 DIAGNOSIS — I69391 Dysphagia following cerebral infarction: Secondary | ICD-10-CM | POA: Diagnosis not present

## 2020-11-06 DIAGNOSIS — I130 Hypertensive heart and chronic kidney disease with heart failure and stage 1 through stage 4 chronic kidney disease, or unspecified chronic kidney disease: Secondary | ICD-10-CM | POA: Diagnosis not present

## 2020-11-06 NOTE — Telephone Encounter (Signed)
Verbal order given to Amy 

## 2020-11-06 NOTE — Telephone Encounter (Signed)
Please ok that verbal order  

## 2020-11-06 NOTE — Telephone Encounter (Signed)
[facility] called and stated they would like verbal orders to see the patient for hospice care. And when they call ask for amy or kelly

## 2020-11-26 ENCOUNTER — Telehealth: Payer: Self-pay

## 2020-11-26 NOTE — Chronic Care Management (AMB) (Addendum)
Chronic Care Management Pharmacy Assistant   Name: Amanda Benson  MRN: [id number] DOB: 12-08-1935  Reason for Encounter: Medication Adherence and Delivery Coordination   Recent office visits:  10/30/20 - Family Medicine - Telephone visit for cough. Start benzonatate 100 mg take 1 tablet 3 times daily as needed.  Recent consult visits:  None since last CCM contact  Hospital visits:  None in previous 6 months  Medications: Outpatient Encounter Medications as of 11/26/2020  Medication Sig   amLODipine (NORVASC) 10 MG tablet TAKE 1 TABLET(10 MG) BY MOUTH DAILY   aspirin 81 MG EC tablet Take 81 mg by mouth daily.   benzonatate (TESSALON PERLES) 100 MG capsule Take 1 capsule (100 mg total) by mouth 3 (three) times daily as needed.   buPROPion (WELLBUTRIN XL) 300 MG 24 hr tablet TAKE 1 TABLET(300 MG) BY MOUTH DAILY   FLUoxetine (PROZAC) 40 MG capsule TAKE 1 CAPSULE(40 MG) BY MOUTH DAILY   glipiZIDE (GLUCOTROL XL) 10 MG 24 hr tablet TAKE 1 TABLET(10 MG) BY MOUTH DAILY   Insulin Pen Needle 29G X MISC Use with insulin daily   losartan-hydrochlorothiazide (HYZAAR) 100-25 MG tablet Take 0.5 tablets by mouth daily.   raloxifene (EVISTA) 60 MG tablet TAKE 1 TABLET(60 MG) BY MOUTH DAILY   Semaglutide,0.25 or 0.5MG /DOS, (OZEMPIC, 0.25 OR 0.5 MG/DOSE,) 2 MG/1.5ML SOPN Inject 0.25 mg into the skin once a week.   simvastatin (ZOCOR) 20 MG tablet TAKE 1 TABLET(20 MG) BY MOUTH DAILY   No facility-administered encounter medications on file as of 11/26/2020.   BP Readings from Last 3 Encounters:  10/30/20 (!) 152/70  10/22/20 (!) 148/59  10/03/20 (!) 146/68    Lab Results  Component Value Date   HGBA1C 8.0 (H) 06/21/2020    Last adherence delivery date:11/05/2020      Patient is due for next adherence delivery on: 12/04/2020  Spoke with patient on 11/26/20(Spoke with daughter Amanda Benson) reviewed medications and coordinated delivery.  This delivery to include: Adherence Packaging  30 Days   Packs: Multivitamin (Women's One A Day) - 2 gummies daily (2 breakfast) Losartan 50mg  - 1 tablet daily (1 breakfast) Hydrochlorothiazide 12.5 mg- 1 tablet (1 breakfast) Amlodipine 10mg - 1 tablet daily (1 breakfast) Fluoxetine 40mg  - 1 capsule daily (1 breakfast) Bupropion 300mg  24 hr-1 tablet daily (1 breakfast) Raloxifene 60mg -1 tablet daily (1 breakfast) Glipizide 10mg  XL - 1 tablet daily (1 breakfast)  VIAL medications: none   Patient declined the following medications this month: Ozempic Inj 2/1.5 ML inject 0.25 mg into skin once a week - Getting from patient assistance Simvastatin 20mg -1 tablet daily (1 evening meal)(Hospice stopped 8/24) Aspirin 81mg -1 tablet daily (1 evening meal)(Hospice stopped 8/24)   Any concerns about your medications?  The daughter states the patient is now is Hospice care and they have stopped aspirin and simvastatin.  How often do you forget or accidentally miss a dose? Never  Is patient in packaging Yes   No refill request needed from PCP.  Confirmed delivery date of 12/04/20, advised patient that pharmacy will contact them the morning of delivery.  Recent blood glucose readings are as follows: Fasting: 11/24/20 111     11/25/20 78     11/26/20 101  9/24, CPP notified  , [facility] Clincal Pharmacy Assistant [phone]  I have reviewed the care management and care coordination activities outlined in this encounter and I am certifying that I agree with the content of this note. If patient is in  hospice care, will need to un-enroll from CCM. Will contact Amanda Benson to confirm.  Phil Dopp, PharmD Clinical Pharmacist [facility] at [facility] [phone]

## 2020-12-17 ENCOUNTER — Telehealth: Payer: Self-pay

## 2020-12-17 NOTE — Progress Notes (Addendum)
    Chronic Care Management Pharmacy Assistant   Name: JOLYSSA OPLINGER  MRN: [id number] DOB: 31-Aug-1935   Reason for Encounter: Patient Assistance Update - Ozempic   Medications: Outpatient Encounter Medications as of 12/17/2020  Medication Sig   amLODipine (NORVASC) 10 MG tablet TAKE 1 TABLET(10 MG) BY MOUTH DAILY   aspirin 81 MG EC tablet Take 81 mg by mouth daily.   benzonatate (TESSALON PERLES) 100 MG capsule Take 1 capsule (100 mg total) by mouth 3 (three) times daily as needed.   buPROPion (WELLBUTRIN XL) 300 MG 24 hr tablet TAKE 1 TABLET(300 MG) BY MOUTH DAILY   FLUoxetine (PROZAC) 40 MG capsule TAKE 1 CAPSULE(40 MG) BY MOUTH DAILY   glipiZIDE (GLUCOTROL XL) 10 MG 24 hr tablet TAKE 1 TABLET(10 MG) BY MOUTH DAILY   Insulin Pen Needle 29G X MISC Use with insulin daily   losartan-hydrochlorothiazide (HYZAAR) 100-25 MG tablet Take 0.5 tablets by mouth daily.   raloxifene (EVISTA) 60 MG tablet TAKE 1 TABLET(60 MG) BY MOUTH DAILY   Semaglutide,0.25 or 0.5MG /DOS, (OZEMPIC, 0.25 OR 0.5 MG/DOSE,) 2 MG/1.5ML SOPN Inject 0.25 mg into the skin once a week.   simvastatin (ZOCOR) 20 MG tablet TAKE 1 TABLET(20 MG) BY MOUTH DAILY   No facility-administered encounter medications on file as of 12/17/2020.    Patient is enrolled in [employer] assistance program for Ozempic 0.25 mg weekly. A new form is needed to update address for shipping to: [facility] at [facility] [address] [place], [place] [address]  Pages 4-5 of application completed with new address and will need PCP signature. Refill/reorder request form also completed and will need PCP signature. Applications have been sent to CMA for signature and fax to [employer] to avoid delays in patient receiving their medication. Patient will need to contacted upon arrival to [facility] to pick up medication delivery   Phil Dopp, CPP notified  Jomarie Longs, [facility] Clinical Pharmacy  Assistant [phone]

## 2020-12-18 ENCOUNTER — Other Ambulatory Visit: Payer: Self-pay | Admitting: Family Medicine

## 2020-12-24 ENCOUNTER — Telehealth: Payer: Self-pay

## 2020-12-24 NOTE — Telephone Encounter (Signed)
Attempted to reach patient to let them know about pick up of Ozempic through assistance program at [place] office and refrigeration requirements. Left VM for call back.  Phil Dopp, PharmD Clinical Pharmacist [place] at [facility] [phone]

## 2020-12-25 ENCOUNTER — Telehealth: Payer: Self-pay

## 2020-12-25 NOTE — Chronic Care Management (AMB) (Addendum)
    Chronic Care Management Pharmacy Assistant   Name: Amanda Benson  MRN: [id number] DOB: 1935-05-09  Reason for Encounter: Medication Adherence and Delivery Coordination   Recent office visits:  None since last CCM contact  Recent consult visits:  None since last CCM contact  Hospital visits:  None in previous 6 months  Medications: Outpatient Encounter Medications as of 12/25/2020  Medication Sig   amLODipine (NORVASC) 10 MG tablet TAKE ONE TABLET BY MOUTH EVERY MORNING   aspirin 81 MG EC tablet Take 81 mg by mouth daily.   benzonatate (TESSALON PERLES) 100 MG capsule Take 1 capsule (100 mg total) by mouth 3 (three) times daily as needed.   buPROPion (WELLBUTRIN XL) 300 MG 24 hr tablet TAKE 1 TABLET(300 MG) BY MOUTH DAILY   FLUoxetine (PROZAC) 40 MG capsule TAKE ONE CAPSULE BY MOUTH EVERY MORNING   glipiZIDE (GLUCOTROL XL) 10 MG 24 hr tablet TAKE ONE TABLET BY MOUTH every morning   Insulin Pen Needle 29G X MISC Use with insulin daily   losartan-hydrochlorothiazide (HYZAAR) 100-25 MG tablet Take 0.5 tablets by mouth daily.   raloxifene (EVISTA) 60 MG tablet TAKE 1 TABLET(60 MG) BY MOUTH DAILY   Semaglutide,0.25 or 0.5MG /DOS, (OZEMPIC, 0.25 OR 0.5 MG/DOSE,) 2 MG/1.5ML SOPN Inject 0.25 mg into the skin once a week.   simvastatin (ZOCOR) 20 MG tablet TAKE 1 TABLET(20 MG) BY MOUTH DAILY   No facility-administered encounter medications on file as of 12/25/2020.   BP Readings from Last 3 Encounters:  10/30/20 (!) 152/70  10/22/20 (!) 148/59  10/03/20 (!) 146/68    Lab Results  Component Value Date   HGBA1C 8.0 (H) 06/21/2020     No OVs, Consults, or hospital visits since last care coordination call / Pharmacist visit. No medication changes indicated   Last adherence delivery date:12/04/20      Patient is due for next adherence delivery on: 01/05/21  Multiple attempts made to reach patient. Unsuccessful outreach. Will refill based off of last adherence fill.  Multiple  attempts to reach out to daughter ,[name].  This delivery to include: Adherence Packaging  30 Days  Packs: Multivitamin (Women's One A Day) - 2 gummies daily (2 breakfast) Losartan 50mg  - 1 tablet daily (1 breakfast) Hydrochlorothiazide 12.5 mg- 1 tablet (1 breakfast) Amlodipine 10mg - 1 tablet daily (1 breakfast) Fluoxetine 40mg  - 1 capsule daily (1 breakfast) Bupropion 300mg  24 hr-1 tablet daily (1 breakfast) Raloxifene 60mg -1 tablet daily (1 breakfast) Glipizide 10mg  XL - 1 tablet daily (1 breakfast)  VIAL medications: None   Patient declined the following medications this month: Ozempic Inj 2/1.5 ML inject 0.25 mg into skin once a week - Getting from patient assistance Simvastatin 20mg -1 tablet daily (1 evening meal)(Hospice stopped 8/24) Aspirin 81mg -1 tablet daily (1 evening meal)(Hospice stopped 8/24)  Unable to verify any new changes, left message.  , CPP notified  , [facility] Clincal Pharmacy Assistant [phone]  I have reviewed the care management and care coordination activities outlined in this encounter and I am certifying that I agree with the content of this note. No further action required.  , PharmD Clinical Pharmacist [place] [facility] at [facility] [phone]

## 2021-01-22 ENCOUNTER — Telehealth: Payer: Self-pay

## 2021-01-22 NOTE — Chronic Care Management (AMB) (Addendum)
    Chronic Care Management Pharmacy Assistant   Name: Amanda Benson  MRN: [id number] DOB: March 29, 1936  Reason for Encounter: Medication Adherence and Delivery Coordination    Recent office visits:  None since last CCM contact  Recent consult visits:  None since last CCM contact  Hospital visits:  None in previous 6 months  Medications: Outpatient Encounter Medications as of 01/22/2021  Medication Sig   amLODipine (NORVASC) 10 MG tablet TAKE ONE TABLET BY MOUTH EVERY MORNING   aspirin 81 MG EC tablet Take 81 mg by mouth daily.   benzonatate (TESSALON PERLES) 100 MG capsule Take 1 capsule (100 mg total) by mouth 3 (three) times daily as needed.   buPROPion (WELLBUTRIN XL) 300 MG 24 hr tablet TAKE 1 TABLET(300 MG) BY MOUTH DAILY   FLUoxetine (PROZAC) 40 MG capsule TAKE ONE CAPSULE BY MOUTH EVERY MORNING   glipiZIDE (GLUCOTROL XL) 10 MG 24 hr tablet TAKE ONE TABLET BY MOUTH every morning   Insulin Pen Needle 29G X MISC Use with insulin daily   losartan-hydrochlorothiazide (HYZAAR) 100-25 MG tablet Take 0.5 tablets by mouth daily.   raloxifene (EVISTA) 60 MG tablet TAKE 1 TABLET(60 MG) BY MOUTH DAILY   Semaglutide,0.25 or 0.5MG /DOS, (OZEMPIC, 0.25 OR 0.5 MG/DOSE,) 2 MG/1.5ML SOPN Inject 0.25 mg into the skin once a week.   simvastatin (ZOCOR) 20 MG tablet TAKE 1 TABLET(20 MG) BY MOUTH DAILY   No facility-administered encounter medications on file as of 01/22/2021.   BP Readings from Last 3 Encounters:  10/30/20 (!) 152/70  10/22/20 (!) 148/59  10/03/20 (!) 146/68    Lab Results  Component Value Date   HGBA1C 8.0 (H) 06/21/2020      No OVs, Consults, or hospital visits since last care coordination call / Pharmacist visit. No medication changes indicated   Last adherence delivery date:01/05/21      Patient is due for next adherence delivery on: 02/03/21  Multiple attempts made to reach patient. Unsuccessful outreach. Will refill based off of last adherence fill.    This delivery to include: Adherence Packaging  30 Days  Packs: Losartan 50mg  - 1 tablet daily (1 breakfast) Hydrochlorothiazide 12.5 mg- 1 tablet (1 breakfast) Amlodipine 10mg - 1 tablet daily (1 breakfast) Fluoxetine 40mg  - 1 capsule daily (1 breakfast) Bupropion 300mg  24 hr-1 tablet daily (1 breakfast) Raloxifene 60mg -1 tablet daily (1 breakfast) Glipizide 10mg  XL - 1 tablet daily (1 breakfast)  VIAL medications: Multivitamin (Women's One A Day) - 2 gummies daily (2 breakfast)   Patient declined the following medications this month: Ozempic Inj 2/1.5 ML inject 0.25 mg into skin once a week - Getting from patient assistance (unknown if patient has picked up from the office)  Annual wellness visit in last year? No Most Recent BP reading: 146/68  84-P  If Diabetic: Most recent A1C reading: 8.0  06/21/20 Last eye exam / retinopathy screening: 09/01/20 Last diabetic foot exam:  05/12/20 , CPP notified  , [facility] Clincal Pharmacy Assistant [phone]  I have reviewed the care management and care coordination activities outlined in this encounter and I am certifying that I agree with the content of this note. No further action required.  09/03/20, PharmD Clinical Pharmacist [facility] [facility] at [facility] [phone]

## 2021-02-23 ENCOUNTER — Telehealth: Payer: Self-pay

## 2021-02-23 NOTE — Chronic Care Management (AMB) (Addendum)
    Chronic Care Management Pharmacy Assistant   Name: Amanda Benson  MRN: [id number] DOB: [birth year]   Reason for Encounter: Medication Adherence and Delivery Coordination    Recent office visits:  None since last CCM contact  Recent consult visits:  None since last CCM contact  Hospital visits:  None in previous 6 months  Medications: Outpatient Encounter Medications as of 02/23/2021  Medication Sig   amLODipine (NORVASC) 10 MG tablet TAKE ONE TABLET BY MOUTH EVERY MORNING   aspirin 81 MG EC tablet Take 81 mg by mouth daily.   benzonatate (TESSALON PERLES) 100 MG capsule Take 1 capsule (100 mg total) by mouth 3 (three) times daily as needed.   buPROPion (WELLBUTRIN XL) 300 MG 24 hr tablet TAKE 1 TABLET(300 MG) BY MOUTH DAILY   FLUoxetine (PROZAC) 40 MG capsule TAKE ONE CAPSULE BY MOUTH EVERY MORNING   glipiZIDE (GLUCOTROL XL) 10 MG 24 hr tablet TAKE ONE TABLET BY MOUTH every morning   Insulin Pen Needle 29G X MISC Use with insulin daily   losartan-hydrochlorothiazide (HYZAAR) 100-25 MG tablet Take 0.5 tablets by mouth daily.   raloxifene (EVISTA) 60 MG tablet TAKE 1 TABLET(60 MG) BY MOUTH DAILY   Semaglutide,0.25 or 0.5MG /DOS, (OZEMPIC, 0.25 OR 0.5 MG/DOSE,) 2 MG/1.5ML SOPN Inject 0.25 mg into the skin once a week.   simvastatin (ZOCOR) 20 MG tablet TAKE 1 TABLET(20 MG) BY MOUTH DAILY   No facility-administered encounter medications on file as of 02/23/2021.    BP Readings from Last 3 Encounters:  10/30/20 (!) 152/70  10/22/20 (!) 148/59  10/03/20 (!) 146/68    Lab Results  Component Value Date   HGBA1C 8.0 (H) 06/21/2020      No OVs, Consults, or hospital visits since last care coordination call / Pharmacist visit. No medication changes indicated   Last adherence delivery date:02/03/21      Patient is due for next adherence delivery on: 03/05/21  Multiple attempts made to reach patient. Unsuccessful outreach. Will refill based off of last adherence fill.    This delivery to include: Adherence Packaging  30 Days  Packs: Losartan 50mg  - 1 tablet daily (1 breakfast) Hydrochlorothiazide 12.5 mg- 1 tablet (1 breakfast) Amlodipine 10mg - 1 tablet daily (1 breakfast) Fluoxetine 40mg  - 1 capsule daily (1 breakfast) Bupropion 300mg  24 hr-1 tablet daily (1 breakfast) Raloxifene 60mg -1 tablet daily (1 breakfast) Glipizide 10mg  XL - 1 tablet daily (1 breakfast)  VIAL medications: Multivitamin (Women's One A Day) - 2 gummies daily (2 breakfast)   Patient declined the following medications this month: Ozempic Inj 2/1.5 ML inject 0.25 mg into skin once a week - Getting from patient assistance (unknown if patient has picked up from the office)     Refills requested from providers include: Bupropion HCL XL, Raloxefene  Delivery scheduled for 03/05/21. Unable to speak with patient to confirm date.    Annual wellness visit in last year? No Most Recent BP reading:146/68  84-P  If Diabetic: Most recent A1C reading: 8.0 Last eye exam / retinopathy screening:08/2020 Last diabetic foot exam: 05/2020  , CPP notified  , [facility] Clin[facility] Pharmacy Assistant [phone]  I have reviewed the care management and care coordination activities outlined in this encounter and I am certifying that I agree with the content of this note. No further action required.  14/1/22, PharmD Clinical Pharmacist [facility] at [facility] [phone]

## 2021-03-03 ENCOUNTER — Telehealth: Payer: Self-pay

## 2021-03-03 NOTE — Telephone Encounter (Signed)
Attempted to reach [place] since we have attempted 3 calls for CCM/medication adherence without answer. Left voicemail requesting a return call to confirm how patient is doing.  Phil Dopp, PharmD Clinical Pharmacist [facility] at [facility] [phone]

## 2021-03-20 ENCOUNTER — Telehealth: Payer: Self-pay

## 2021-03-20 DIAGNOSIS — I10 Essential (primary) hypertension: Secondary | ICD-10-CM

## 2021-03-20 NOTE — Chronic Care Management (AMB) (Addendum)
Chronic Care Management Pharmacy Assistant   Name: Amanda Benson  MRN: [id number] DOB: April 16, 1935  Reason for Encounter: Medication Adherence and Delivery Coordination  Recent office visits:  None since last CCM contact  Recent consult visits:  None snce last CCM contact  Hospital visits:  None in previous 6 months  Medications: Outpatient Encounter Medications as of 03/20/2021  Medication Sig   amLODipine (NORVASC) 10 MG tablet TAKE ONE TABLET BY MOUTH EVERY MORNING   aspirin 81 MG EC tablet Take 81 mg by mouth daily.   benzonatate (TESSALON PERLES) 100 MG capsule Take 1 capsule (100 mg total) by mouth 3 (three) times daily as needed.   buPROPion (WELLBUTRIN XL) 300 MG 24 hr tablet TAKE 1 TABLET(300 MG) BY MOUTH DAILY   FLUoxetine (PROZAC) 40 MG capsule TAKE ONE CAPSULE BY MOUTH EVERY MORNING   glipiZIDE (GLUCOTROL XL) 10 MG 24 hr tablet TAKE ONE TABLET BY MOUTH every morning   Insulin Pen Needle 29G X MISC Use with insulin daily   losartan-hydrochlorothiazide (HYZAAR) 100-25 MG tablet Take 0.5 tablets by mouth daily.   raloxifene (EVISTA) 60 MG tablet TAKE 1 TABLET(60 MG) BY MOUTH DAILY   Semaglutide,0.25 or 0.5MG /DOS, (OZEMPIC, 0.25 OR 0.5 MG/DOSE,) 2 MG/1.5ML SOPN Inject 0.25 mg into the skin once a week.   simvastatin (ZOCOR) 20 MG tablet TAKE 1 TABLET(20 MG) BY MOUTH DAILY   No facility-administered encounter medications on file as of 03/20/2021.   BP Readings from Last 3 Encounters:  10/30/20 (!) 152/70  10/22/20 (!) 148/59  10/03/20 (!) 146/68    Lab Results  Component Value Date   HGBA1C 8.0 (H) 06/21/2020      No OVs, Consults, or hospital visits since last care coordination call / Pharmacist visit. No medication changes indicated   Last adherence delivery date:03/05/21      Patient is due for next adherence delivery on: 04/03/21  Spoke with patient on 03/23/21 reviewed medications and coordinated delivery.  Spoke with daughter Amanda Benson.  This  delivery to include: Adherence Packaging  30 Days  Packs: Losartan 50mg  - 1 tablet daily (1 breakfast) Hydrochlorothiazide 12.5 mg- 1 tablet (1 breakfast) Amlodipine 10mg - 1 tablet daily (1 breakfast) Fluoxetine 40mg  - 1 capsule daily (1 breakfast) Bupropion 300mg  24 hr-1 tablet daily (1 breakfast) Raloxifene 60mg -1 tablet daily (1 breakfast) Glipizide 10mg  XL - 1 tablet daily (1 breakfast) Trazodone 50mg   take 1 tablet bedtime (patient reports taking 2 at bedtime per increase by Dr. )  VIAL medications: none  Patient declined the following medications this month: Multivitamin (Womens One A Day) - 2 gummies daily (2 breakfast) - She has plenty on hand  Ozempic Inj 2/1.5 ML inject 0.25 mg into skin once a week - Getting from patient assistance (per daughter , never picked up anything from MD office) The patient does not have it to take.   Any concerns about your medications? No  How often do you forget or accidentally miss a dose? Never  Do you use a pillbox? Yes  Is patient in packaging Yes  What is the date on your next pill pack? Daughter not near by the box to verify date.   Any concerns or issues with your packaging? Very happy with the packaging process   Refills requested from providers include: Raloxefine, bupropion hcl XL, trazodone (increase to 2 tablets)  Confirmed delivery date of 04/03/21, advised patient that pharmacy will contact them the morning of delivery.  Recent blood pressure readings are  as follows: none available. The daughter states per RN that comes to home her BP has been within normal limits  Recent blood glucose readings are as follows: Fasting:  ranges 102,110,126  daughter states BG's have been normal limits    Annual wellness visit in last year? No Most Recent BP reading: 160/45  79-P  (ED)  If Diabetic: Most recent A1C reading:8.0 Last eye exam / retinopathy screening: 08/2020 Last diabetic foot exam: 05/2020  Phil Dopp,  CPP notified  Burt Knack, [facility] Clincal Pharmacy Assistant [phone]  I have reviewed the care management and care coordination activities outlined in this encounter and I am certifying that I agree with the content of this note. No further action required.  Phil Dopp, PharmD Clinical Pharmacist [facility] at [facility] [phone]

## 2021-03-23 MED ORDER — HYDROCHLOROTHIAZIDE 12.5 MG PO TABS: 12.5000 mg | ORAL_TABLET | Freq: Every morning | ORAL | 0 refills | Status: DC

## 2021-03-23 MED ORDER — LOSARTAN POTASSIUM 50 MG PO TABS: 50.0000 mg | ORAL_TABLET | Freq: Every morning | ORAL | 0 refills | Status: DC

## 2021-03-23 NOTE — Addendum Note (Signed)
Addended by: Phil Dopp on: 03/23/2021 01:56 PM   Modules accepted: Orders

## 2021-03-23 NOTE — Telephone Encounter (Signed)
MED REC completed. Discontinued Ozempic because patient stopped taking several months ago. Never picked up medication from office (was receiving from PAP). No issues with the medication were reported. Pt taking HCTZ 12.5 mg and losartan 50 mg separately due to cost instead of combination tablet. Updated med list. Per patient daughter, simvastatin was stopped by home health several months ago. She has not taken it since August 2022.

## 2021-03-26 NOTE — Progress Notes (Deleted)
Subjective:   Amanda Benson is a 85 y.o. female who presents for Medicare Annual (Subsequent) preventive examination.  I connected with Amanda Benson today by telephone and verified that I am speaking with the correct person using two identifiers. Location patient: home Location provider: work Persons participating in the virtual visit: patient, Engineer, civil (consulting).    I discussed the limitations, risks, security and privacy concerns of performing an evaluation and management service by telephone and the availability of in person appointments. I also discussed with the patient that there may be a patient responsible charge related to this service. The patient expressed understanding and verbally consented to this telephonic visit.    Interactive audio and video telecommunications were attempted between this provider and patient, however failed, due to patient having technical difficulties OR patient did not have access to video capability.  We continued and completed visit with audio only.  Some vital signs may be absent or patient reported.   Time Spent with patient on telephone encounter: *** minutes  Review of Systems           Objective:    There were no vitals filed for this visit. There is no height or weight on file to calculate BMI.  Advanced Directives 10/21/2020 09/29/2020 09/04/2020 06/21/2020 11/25/2019 08/29/2018 08/22/2017  Does Patient Have a Medical Advance Directive? No No No Unable to assess, patient is non-responsive or altered mental status Yes Yes No  Type of Advance Directive - - - - Living will Healthcare Power of [place];Living will -  Does patient want to make changes to medical advance directive? - - - - No - Patient declined No - Patient declined Yes (MAU/Ambulatory/Procedural Areas - Information given)  Copy of Healthcare Power of Attorney in Chart? - - - - - Yes - validated most recent copy scanned in chart (See row information) -  Would patient like information on  creating a medical advance directive? - - No - Patient declined - No - Patient declined No - Patient declined -    Current Medications (verified) Outpatient Encounter Medications as of 03/31/2021  Medication Sig   amLODipine (NORVASC) 10 MG tablet TAKE ONE TABLET BY MOUTH EVERY MORNING   buPROPion (WELLBUTRIN XL) 300 MG 24 hr tablet TAKE 1 TABLET(300 MG) BY MOUTH DAILY   FLUoxetine (PROZAC) 40 MG capsule TAKE ONE CAPSULE BY MOUTH EVERY MORNING   glipiZIDE (GLUCOTROL XL) 10 MG 24 hr tablet TAKE ONE TABLET BY MOUTH every morning   hydrochlorothiazide (HYDRODIURIL) 12.5 MG tablet Take 1 tablet (12.5 mg total) by mouth every morning.   Insulin Pen Needle 29G X MISC Use with insulin daily   losartan (COZAAR) 50 MG tablet Take 1 tablet (50 mg total) by mouth in the morning.   raloxifene (EVISTA) 60 MG tablet TAKE 1 TABLET(60 MG) BY MOUTH DAILY   No facility-administered encounter medications on file as of 03/31/2021.    Allergies (verified) Alendronate sodium   History: Past Medical History:  Diagnosis Date   Depression    Diabetes mellitus    type II   Hyperlipidemia    Hypertension    Obesity    Osteoporosis    Past Surgical History:  Procedure Laterality Date   ANKLE SURGERY     left medial malleblus fracture   CHOLECYSTECTOMY     Family History  Problem Relation Age of Onset   Diabetes Mother    Hypertension Mother    Asthma Father    Hypertension Sister  Cancer Sister        lung   Asthma Brother    COPD Brother    Cancer Maternal Grandmother        breast    Asthma Brother    Diabetes Mellitus II Daughter    COPD Daughter    Social History   Socioeconomic History   Marital status: Married    Spouse name: Not on file   Number of children: 7   Years of education: Not on file   Highest education level: Not on file  Occupational History    Employer: RETIRED  Tobacco Use   Smoking status: Never   Smokeless tobacco: Never  Vaping Use   Vaping Use:  Never used  Substance and Sexual Activity   Alcohol use: No    Alcohol/week: 0.0 standard drinks   Drug use: No   Sexual activity: Not Currently  Other Topics Concern   Not on file  Social History Narrative   Not on file   Social Determinants of Health   Financial Resource Strain: Not on file  Food Insecurity: Not on file  Transportation Needs: Not on file  Physical Activity: Not on file  Stress: Not on file  Social Connections: Not on file    Tobacco Counseling Counseling given: Not Answered   Clinical Intake:                 Diabetes:  Is the patient diabetic?  Yes  If diabetic, was a CBG obtained today?  No , visit completed over the phone. Did the patient bring in their glucometer from home?  No  How often do you monitor your CBG's? ***.   Financial Strains and Diabetes Management:  Are you having any financial strains with the device, your supplies or your medication? {YES/NO:21197}.  Does the patient want to be seen by Chronic Care Management for management of their diabetes?  {YES/NO:21197} Would the patient like to be referred to a Nutritionist or for Diabetic Management?  {YES/NO:21197}  Diabetic Exams:  Diabetic Eye Exam: Completed 09/01/20.    Diabetic Foot Exam: Completed 05/12/20.          Activities of Daily Living No flowsheet data found.  Patient Care Team: Tower, Audrie Gallus, MD as PCP - General Phil Dopp, [facility] as Pharmacist (Pharmacist)  Indicate any recent Medical Services you may have received from other than [facility] providers in the past year (date may be approximate).     Assessment:   This is a routine wellness examination for [place].  Hearing/Vision screen No results found.  Dietary issues and exercise activities discussed:     Goals Addressed   None    Depression Screen PHQ 2/9 Scores 08/29/2018 08/22/2017 08/17/2016 05/16/2015 05/30/2013 05/11/2012  PHQ - 2 Score 3 2 1  0 0 0  PHQ- 9 Score 4 11 - - - -    Fall  Risk Fall Risk  02/05/2020 08/29/2018 08/22/2017 08/17/2016 05/16/2015  Falls in the past year? 1 0 Yes Yes No  Comment - - lost balance and fell; denies injury 4 falls per daughter; no medical treatment -  Number falls in past yr: 1 - 1 2 or more -  Injury with Fall? 0 - No No -  Risk Factor Category  - - - - -  Risk for fall due to : - - - Impaired balance/gait;Impaired mobility -    FALL RISK PREVENTION PERTAINING TO THE HOME:  Any stairs in or around the home? {YES/NO:21197}  If so, are there any without handrails? {YES/NO:21197} Home free of loose throw rugs in walkways, pet beds, electrical cords, etc? {YES/NO:21197} Adequate lighting in your home to reduce risk of falls? {YES/NO:21197}  ASSISTIVE DEVICES UTILIZED TO PREVENT FALLS:  Life alert? {YES/NO:21197} Use of a cane, walker or w/c? {YES/NO:21197} Grab bars in the bathroom? {YES/NO:21197} Shower chair or bench in shower? {YES/NO:21197} Elevated toilet seat or a handicapped toilet? {YES/NO:21197}  TIMED UP AND GO:  Was the test performed? No , visit completed over the phone.   Cognitive Function: MMSE - Mini Mental State Exam 08/29/2018 08/22/2017 08/17/2016  Orientation to time Orientation to Place Registration Attention/ Calculation 0 0 0  Recall Language- name 2 objects 0 0 0  Language- repeat Language- follow 3 step command 0 3 3  Language- read & follow direction 0 0 0  Write a sentence 0 0 0  Copy design 0 0 0  Total score Immunizations Immunization History  Administered Date(s) Administered   Fluad Quad(high Dose 65+) 03/27/2019, 02/05/2020   Influenza Split 02/18/2011   Influenza Whole 01/12/2002, 04/08/2009, 01/05/2010   Influenza,inj,Quad PF,6+ Mos 05/10/2014, 02/12/2015, 02/24/2016, 06/08/2017, 03/14/2018   Moderna Sars-Covid-2 Vaccination 03/11/2020, 04/18/2020   Pneumococcal Conjugate-13 02/12/2015   Pneumococcal Polysaccharide-23 11/26/2011    Td 10/30/2002, 05/10/2012    TDAP status: Up to date  {Flu Vaccine status:2101806}  Pneumococcal vaccine status: Up to date  {Covid-19 vaccine status:2101808}  Qualifies for Shingles Vaccine? Yes   Zostavax completed No   {Shingrix Completed?:2101804}  Screening Tests Health Maintenance  Topic Date Due   Zoster Vaccines- Shingrix (1 of 2) Never done   MAMMOGRAM  03/22/2015   COVID-19 Vaccine (3 - Booster for Moderna series) 06/13/2020   INFLUENZA VACCINE  11/03/2020   HEMOGLOBIN A1C  12/22/2020   FOOT EXAM  05/12/2021   OPHTHALMOLOGY EXAM  09/01/2021   TETANUS/TDAP  05/10/2022   Pneumonia Vaccine 34+ Years old  Completed   DEXA SCAN  Completed   HPV VACCINES  Aged Out    Health Maintenance  Health Maintenance Due  Topic Date Due   Zoster Vaccines- Shingrix (1 of 2) Never done   MAMMOGRAM  03/22/2015   COVID-19 Vaccine (3 - Booster for Moderna series) 06/13/2020   INFLUENZA VACCINE  11/03/2020   HEMOGLOBIN A1C  12/22/2020    Colorectal cancer screening: No longer required.   {Mammogram status:21018020}  {Bone Density status:21018021}  Lung Cancer Screening: (Low Dose CT Chest recommended if Age 66-80 years, 30 pack-year currently smoking OR have quit w/in 15years.) does not qualify.     Additional Screening:  Hepatitis C Screening: does not qualify  Vision Screening: Recommended annual ophthalmology exams for early detection of glaucoma and other disorders of the eye. Is the patient up to date with their annual eye exam?  {YES/NO:21197} Who is the provider or what is the name of the office in which the patient attends annual eye exams? *** If pt is not established with a provider, would they like to be referred to a provider to establish care? {YES/NO:21197}.   Dental Screening: Recommended annual dental exams for proper oral hygiene  Community Resource Referral / Chronic Care Management: CRR required this visit?  {YES/NO:21197}  CCM required this  visit?  {YES/NO:21197}     Plan:     I have personally  reviewed and noted the following in the patients chart:   Medical and social history Use of alcohol, tobacco or illicit drugs  Current medications and supplements including opioid prescriptions.  Functional ability and status Nutritional status Physical activity Advanced directives List of other physicians Hospitalizations, surgeries, and ER visits in previous 12 months Vitals Screenings to include cognitive, depression, and falls Referrals and appointments  In addition, I have reviewed and discussed with patient certain preventive protocols, quality metrics, and best practice recommendations. A written personalized care plan for preventive services as well as general preventive health recommendations were provided to patient.   Due to this being a telephonic visit, the after visit summary with patients personalized plan was offered to patient via mail or my-chart. ***Patient declined at this time./ Patient would like to access on my-chart/ per request, patient was mailed a copy of AVS./ Patient preferred to pick up at office at next visit.   Janne Lab, LPN   [phone]/04/[id number]   Nurse Health Advisor  Nurse Notes: none

## 2021-03-31 ENCOUNTER — Telehealth: Payer: Self-pay

## 2021-03-31 ENCOUNTER — Ambulatory Visit: Payer: Medicare HMO

## 2021-03-31 NOTE — Telephone Encounter (Signed)
Made several attempts to contact patient in regards to annual wellness telephone visit today at 12:00pm. Left vm message advising patient of the appointment and to contact office to reschedule appointment when available. TM

## 2021-04-22 ENCOUNTER — Telehealth: Payer: Self-pay

## 2021-04-22 NOTE — Chronic Care Management (AMB) (Addendum)
° ° °  Chronic Care Management Pharmacy Assistant   Name: Amanda Benson  MRN: [id number] DOB: 06-29-1935  Reason for Encounter: Medication Adherence and Delivery Coordination   Recent office visits:  None since last CCM contact  Recent consult visits:  None since last CCM contact  Hospital visits:  None in previous 6 months  Medications: Outpatient Encounter Medications as of 04/22/2021  Medication Sig   amLODipine (NORVASC) 10 MG tablet TAKE ONE TABLET BY MOUTH EVERY MORNING   buPROPion (WELLBUTRIN XL) 300 MG 24 hr tablet TAKE 1 TABLET(300 MG) BY MOUTH DAILY   FLUoxetine (PROZAC) 40 MG capsule TAKE ONE CAPSULE BY MOUTH EVERY MORNING   glipiZIDE (GLUCOTROL XL) 10 MG 24 hr tablet TAKE ONE TABLET BY MOUTH every morning   hydrochlorothiazide (HYDRODIURIL) 12.5 MG tablet Take 1 tablet (12.5 mg total) by mouth every morning.   Insulin Pen Needle 29G X MISC Use with insulin daily   losartan (COZAAR) 50 MG tablet Take 1 tablet (50 mg total) by mouth in the morning.   raloxifene (EVISTA) 60 MG tablet TAKE 1 TABLET(60 MG) BY MOUTH DAILY   No facility-administered encounter medications on file as of 04/22/2021.   BP Readings from Last 3 Encounters:  10/30/20 (!) 152/70  10/22/20 (!) 148/59  10/03/20 (!) 146/68    Lab Results  Component Value Date   HGBA1C 8.0 (H) 06/21/2020      No OVs, Consults, or hospital visits since last care coordination call / Pharmacist visit.   Last adherence delivery date:04/03/21      Patient is due for next adherence delivery on: 05/05/21  Multiple attempts made to reach patient. Unsuccessful outreach. Will refill based off of last adherence fill.   This delivery to include: Adherence Packaging  30 Days  Packs: Losartan 50mg  - 1 tablet daily (1 breakfast) Hydrochlorothiazide 12.5 mg- 1 tablet (1 breakfast) Amlodipine 10mg - 1 tablet daily (1 breakfast) Fluoxetine 40mg  - 1 capsule daily (1 breakfast) Bupropion 300mg  24 hr-1 tablet daily (1  breakfast) Raloxifene 60mg -1 tablet daily (1 breakfast) Glipizide 10mg  XL - 1 tablet daily (1 breakfast) Trazodone 50mg   take 2  tablet bedtime (2 bedtime)   VIAL medications: Womens multivitamin- chew 2 tablets every morning   Patient declined the following medications this month: Tramadol 50mg  -take 2 tablets every 8 hours as needed (course completed)  Refills requested from providers include: Trazodone, raloxifene, bupropion hcl XL  Delivery scheduled for 05/05/21. Unable to speak with patient to confirm date.    Annual wellness visit in last year? No Most Recent BP reading: 160/45  79-P  (ED visit)    If Diabetic: Most recent A1C reading: 8.0 % Last eye exam / retinopathy screening: 08/2020 Last diabetic foot exam: 05/2020  , CPP notified  , [facility] Clin[facility] Pharmacy Assistant [phone]  I have reviewed the care management and care coordination activities outlined in this encounter and I am certifying that I agree with the content of this note. No further action required.  , PharmD Clinical Pharmacist [facility] at [facility] [phone]

## 2021-05-26 ENCOUNTER — Telehealth: Payer: Self-pay

## 2021-05-26 NOTE — Chronic Care Management (AMB) (Signed)
° ° °  Chronic Care Management Pharmacy Assistant   Name: Amanda Benson  MRN: [id number] DOB: December 06, 1935   Reason for Encounter: Medication Adherence and Delivery Coordination   Recent office visits:  None since last CCM contact  Recent consult visits:  None since last CCM contact  Hospital visits:  None in previous 6 months  Medications: Outpatient Encounter Medications as of 05/26/2021  Medication Sig   amLODipine (NORVASC) 10 MG tablet TAKE ONE TABLET BY MOUTH EVERY MORNING   buPROPion (WELLBUTRIN XL) 300 MG 24 hr tablet TAKE 1 TABLET(300 MG) BY MOUTH DAILY   FLUoxetine (PROZAC) 40 MG capsule TAKE ONE CAPSULE BY MOUTH EVERY MORNING   glipiZIDE (GLUCOTROL XL) 10 MG 24 hr tablet TAKE ONE TABLET BY MOUTH every morning   hydrochlorothiazide (HYDRODIURIL) 12.5 MG tablet Take 1 tablet (12.5 mg total) by mouth every morning.   Insulin Pen Needle 29G X MISC Use with insulin daily   losartan (COZAAR) 50 MG tablet Take 1 tablet (50 mg total) by mouth in the morning.   raloxifene (EVISTA) 60 MG tablet TAKE 1 TABLET(60 MG) BY MOUTH DAILY   No facility-administered encounter medications on file as of 05/26/2021.   BP Readings from Last 3 Encounters:  10/30/20 (!) 152/70  10/22/20 (!) 148/59  10/03/20 (!) 146/68    Lab Results  Component Value Date   HGBA1C 8.0 (H) 06/21/2020      No OVs, Consults, or hospital visits since last care coordination call / Pharmacist visit. No medication changes indicated   Last adherence delivery date:05/05/21      Patient is due for next adherence delivery on: 06/03/21  Spoke with patient on 05/26/21 reviewed medications and coordinated delivery. Spoke with daughter Amanda Benson  This delivery to include: Adherence Packaging  30 Days  Packs: Hydrochlorothiazide 12.5 mg- 1 tablet (1 breakfast) Amlodipine 10mg - 1 tablet daily (1 breakfast) Fluoxetine 40mg  - 1 capsule daily (1 breakfast) Bupropion 300mg  24 hr-1 tablet daily (1 breakfast) Raloxifene  60mg -1 tablet daily (1 breakfast) Glipizide 10mg  XL - 1 tablet daily (1 breakfast) Trazodone 50mg   take 2  tablet bedtime (2 bedtime)  VIAL medications: Womens multivitamin- chew 2 tablets every morning  Tramadol 50mg - take 2 tablets every  8 hours as needed for pain   Patient declined the following medications this month: Losartan 50mg  - 1 tablet daily (1 breakfast) Hospice discontinued 2 weeks ago    Any concerns about your medications? No  How often do you forget or accidentally miss a dose? Never  Is patient in packaging Yes  What is the date on your next pill pack?05/26/21  Any concerns or issues with your packaging? No concerns family very happy with packaging   Refills requested from providers include:raloxifene,bupropion hcl XL,trazodone,tramadol  Confirmed delivery date of 06/03/21, advised patient that pharmacy will contact them the morning of delivery.  Recent blood pressure readings are as follows: home readings average 128/71 , 125/74 , 142/80   Recent blood glucose readings are as follows: no readings available   Annual wellness visit in last year? No Most Recent BP reading: 128/71   home reading 05/25/21  If Diabetic: Most recent A1C reading:8.0  06/21/20 Last eye exam / retinopathy screening:UTD Last diabetic foot exam: 05/12/20  CPP notified  , [facility] Clincal Pharmacy Assistant [phone]  Total time spent for month CPA: 40 min

## 2021-05-26 NOTE — Addendum Note (Signed)
Addended by: Kathyrn Sheriff on: 05/26/2021 04:43 PM   Modules accepted: Orders

## 2021-06-03 ENCOUNTER — Encounter: Payer: Self-pay | Admitting: Family Medicine

## 2021-06-03 NOTE — Telephone Encounter (Signed)
See mychart, family is asking if hospital f/u appt can be changed to virtual. Please advise  ?

## 2021-06-04 ENCOUNTER — Other Ambulatory Visit: Payer: Self-pay

## 2021-06-04 ENCOUNTER — Telehealth (INDEPENDENT_AMBULATORY_CARE_PROVIDER_SITE_OTHER): Payer: Medicare HMO | Admitting: Family Medicine

## 2021-06-04 ENCOUNTER — Encounter: Payer: Self-pay | Admitting: Family Medicine

## 2021-06-04 VITALS — BP 135/82 | HR 95 | Temp 98.2°F

## 2021-06-04 DIAGNOSIS — I10 Essential (primary) hypertension: Secondary | ICD-10-CM | POA: Diagnosis not present

## 2021-06-04 DIAGNOSIS — N1832 Chronic kidney disease, stage 3b: Secondary | ICD-10-CM | POA: Diagnosis not present

## 2021-06-04 DIAGNOSIS — Z515 Encounter for palliative care: Secondary | ICD-10-CM | POA: Diagnosis not present

## 2021-06-04 DIAGNOSIS — E1169 Type 2 diabetes mellitus with other specified complication: Secondary | ICD-10-CM | POA: Diagnosis not present

## 2021-06-04 DIAGNOSIS — E1140 Type 2 diabetes mellitus with diabetic neuropathy, unspecified: Secondary | ICD-10-CM

## 2021-06-04 DIAGNOSIS — Z8673 Personal history of transient ischemic attack (TIA), and cerebral infarction without residual deficits: Secondary | ICD-10-CM | POA: Diagnosis not present

## 2021-06-04 DIAGNOSIS — R531 Weakness: Secondary | ICD-10-CM | POA: Diagnosis not present

## 2021-06-04 DIAGNOSIS — E785 Hyperlipidemia, unspecified: Secondary | ICD-10-CM

## 2021-06-04 DIAGNOSIS — E538 Deficiency of other specified B group vitamins: Secondary | ICD-10-CM

## 2021-06-04 DIAGNOSIS — F419 Anxiety disorder, unspecified: Secondary | ICD-10-CM | POA: Diagnosis not present

## 2021-06-04 DIAGNOSIS — F32A Depression, unspecified: Secondary | ICD-10-CM

## 2021-06-04 MED ORDER — COOL BLOOD GLUCOSE TEST STRIPS VI STRP: ORAL_STRIP | 3 refills | Status: DC

## 2021-06-04 NOTE — Assessment & Plan Note (Signed)
Disc goals for lipids and reasons to control them ?Rev last labs with pt ?Rev low sat fat diet in detail ?Is palliative status/comfort care -not as concerned about control  ?

## 2021-06-04 NOTE — Assessment & Plan Note (Signed)
With R sided hemiparesis  ? ?Now homebound with palliative status  ?Did graduate from hospice  ?Will place palliative care referral if needed  ?Does not desire aggressive life saving treatment  ?Family is able to do transfers with a lift  ? ?

## 2021-06-04 NOTE — Assessment & Plan Note (Signed)
Home bound ?Palliative status  ?

## 2021-06-04 NOTE — Assessment & Plan Note (Signed)
Lab Results  ?Component Value Date  ? HGBA1C 8.0 (H) 06/21/2020  ? ?Need to re check a1c when able to get labs  ?Palliative/comfort care status -not overly concerned about tight control ?Appetite is back, does not really watch diet ?Could not afford test strips-will [facility] to see if insurance covers them ?Taking glipizide xl 10 mg daily  ?No longer on ozempic  ? ? ?

## 2021-06-04 NOTE — Patient Instructions (Signed)
I sent a px for test strips to [facility]  ? ?No changes in medication  ?Please keep [place] posted  ? ?For goal of comfort,  I placed a referral to palliative care so you will get a call  ? ?We will eventually need to get labs and this can be done drive through  ? ?

## 2021-06-04 NOTE — Assessment & Plan Note (Signed)
Continues wellbutrin and prozac ?No longer leaves home ?Palliative status and her quality of life is better  ? ?Notes has good and bad days  ?

## 2021-06-04 NOTE — Progress Notes (Signed)
Virtual Visit via Video Note  I connected with Amanda Benson on 06/04/21 at 11:30 AM EST by a video enabled telemedicine application and verified that I am speaking with the correct person using two identifiers.  Location: Patient: home Provider: office    I discussed the limitations of evaluation and management by telemedicine and the availability of in person appointments. The patient expressed understanding and agreed to proceed.  Parties involved in encounter  Patient: Amanda Benson   Provider:  Roxy Manns MD   History of Present Illness: Pt presents for f/u of CVA and HTN and DM   Feeling good right now  Staying home  Mobility is very poor   Family has to use a lift getting back in bed  Able to get her out  Has enough help   Had hospice - graduated a week ago  Home care -pending   Appetite is very good    HTN bp is stable today  No cp or palpitations or headaches or edema  No side effects to medicines  BP Readings from Last 3 Encounters:  06/04/21 135/82  10/30/20 (!) 152/70  10/22/20 (!) 148/59    Those are taken before she takes bp   Recently 135 - 130s systolic after her medication  70-82 diastolic   Hctz 12.5 mg daily  Amlodipine 10 mg daily  Prev losartan was d/c by hospice   Speech is ok  Occ confused   No bedsores recently  Her prior ones all cleared up  Using barrier cream    DM2 Lab Results  Component Value Date   HGBA1C 8.0 (H) 06/21/2020   Glipizide xl 10 mg daily  No longer on semaglutide Has not checked blood sugars in a while  No symptoms of lows so not too concerned  Unsure if insurance covers   No more thirst or urination than normal  Not much exercise /does move in bed   No new symptoms of stroke  R side is the weaker side  Can use right hand some / uses stress balls and weights   Goal of quality of life   Lab Results  Component Value Date   CHOL 132 02/05/2020   HDL 40.80 02/05/2020    LDLCALC 54 02/05/2020   LDLDIRECT 59.0 06/09/2018   TRIG 182.0 (H) 02/05/2020   CHOLHDL 3 02/05/2020     Hypothyroidism  Pt has no clinical changes No change in energy level/ hair or skin/ edema and no tremor Lab Results  Component Value Date   TSH 1.553 06/22/2020       CKD Lab Results  Component Value Date   CREATININE 0.76 10/21/2020   BUN 20 10/21/2020   NA 133 (L) 10/21/2020   K 3.4 (L) 10/21/2020   CL 98 10/21/2020   CO2 26 10/21/2020   Mood/depression - good and bad days  Taking wellbutrin and fluoxetine  Does not want to change anything    Patient Active Problem List   Diagnosis Date Noted   History of CVA (cerebrovascular accident) 06/04/2021   Skin breakdown 10/11/2020   Fatigue 10/05/2020   Hypercalcemia 10/03/2020   Swallowing difficulty 10/03/2020   Hypophosphatemia    Hypomagnesemia    Hypokalemia    B12 deficiency    Acute lower UTI 06/21/2020   Acute metabolic encephalopathy 06/21/2020   Hyponatremia 06/21/2020   H/O sepsis 12/09/2019   CKD (chronic kidney disease) 11/23/2019   Leukocytosis 11/23/2019   Mobility impaired 10/13/2019   Incontinence  of urine 10/12/2019   Grief reaction 03/27/2019   Elevated serum creatinine 06/13/2018   Dysuria 06/13/2018   Hearing loss 01/02/2018   Subclinical hypothyroidism 10/26/2017   Anxiety and depression 08/24/2016   Falls frequently 08/24/2016   Generalized weakness 08/24/2016   Routine general medical examination at a health care facility 05/16/2015   Breast calcifications on mammogram 03/01/2014   Encounter for Medicare annual wellness exam 05/30/2013   Breast calcification seen on mammogram 12/14/2012   Breast calcification, left 11/28/2012   History of falling 11/27/2012   Poor balance 11/27/2012   Colon cancer screening 05/10/2012   Osteoporosis 06/21/2007   DM (diabetes mellitus), type 2, uncontrolled 09/20/2006   Type 2 diabetes mellitus with hyperlipidemia (HCC) 09/20/2006   Essential  hypertension 09/20/2006   Osteoarthritis 09/20/2006   Past Medical History:  Diagnosis Date   Depression    Diabetes mellitus    type II   Hyperlipidemia    Hypertension    Obesity    Osteoporosis    Past Surgical History:  Procedure Laterality Date   ANKLE SURGERY     left medial malleblus fracture   CHOLECYSTECTOMY     Social History   Tobacco Use   Smoking status: Never   Smokeless tobacco: Never  Vaping Use   Vaping Use: Never used  Substance Use Topics   Alcohol use: No    Alcohol/week: 0.0 standard drinks   Drug use: No   Family History  Problem Relation Age of Onset   Diabetes Mother    Hypertension Mother    Asthma Father    Hypertension Sister    Cancer Sister        lung   Asthma Brother    COPD Brother    Cancer Maternal Grandmother        breast    Asthma Brother    Diabetes Mellitus II Daughter    COPD Daughter    Allergies  Allergen Reactions   Alendronate Sodium     REACTION: heartburn   Current Outpatient Medications on File Prior to Visit  Medication Sig Dispense Refill   amLODipine (NORVASC) 10 MG tablet TAKE ONE TABLET BY MOUTH EVERY MORNING 90 tablet 1   buPROPion (WELLBUTRIN XL) 300 MG 24 hr tablet TAKE 1 TABLET(300 MG) BY MOUTH DAILY 90 tablet 1   FLUoxetine (PROZAC) 40 MG capsule TAKE ONE CAPSULE BY MOUTH EVERY MORNING 90 capsule 1   glipiZIDE (GLUCOTROL XL) 10 MG 24 hr tablet TAKE ONE TABLET BY MOUTH every morning 90 tablet 1   hydrochlorothiazide (HYDRODIURIL) 12.5 MG tablet Take 1 tablet (12.5 mg total) by mouth every morning. 90 tablet 0   Insulin Pen Needle 29G X MISC Use with insulin daily 90 each 0   raloxifene (EVISTA) 60 MG tablet TAKE 1 TABLET(60 MG) BY MOUTH DAILY 90 tablet 1   No current facility-administered medications on file prior to visit.    Observations/Objective:  Patient appears well, in no distress Weight looks to be down No facial swelling or asymmetry Normal voice-not hoarse and no slurred  speech No obvious tremor or mobility impairment Moving neck and UEs normally Poor hearing, pt's family helps with communication  No cough or shortness of breath during interview  Not confused, can answer some yes and no questions  No skin changes on face or neck , no rash or pallor Affect is normal (smiling)  Assessment and Plan: Problem List Items Addressed This Visit       Cardiovascular and  Mediastinum   Essential hypertension    bp in fair control at this time  BP: 135/82   No changes needed Most recent labs reviewed  Disc lifstyle change with low sodium diet and exercise  Taking hctz 12.5 mg daily and amlodipine 10 mg daily  Acceptable with parameter of palliative/comfort care status No longer taking losartan Will need to schedule drive through labs        Endocrine   Hyperlipidemia associated with type 2 diabetes mellitus (HCC)    Disc goals for lipids and reasons to control them Rev last labs with pt Rev low sat fat diet in detail Is palliative status/comfort care -not as concerned about control       Type 2 diabetes mellitus with diabetic neuropathy, unspecified (HCC)    Lab Results  Component Value Date   HGBA1C 8.0 (H) 06/21/2020  Need to re check a1c when able to get labs  Palliative/comfort care status -not overly concerned about tight control Appetite is back, does not really watch diet Could not afford test strips-will [facility] to see if insurance covers them Taking glipizide xl 10 mg daily  No longer on ozempic           Genitourinary   CKD (chronic kidney disease)    Encouraged to keep fluids up  No longer on losartan Palliative status        Other   Anxiety and depression    Continues wellbutrin and prozac No longer leaves home Palliative status and her quality of life is better   Notes has good and bad days       Generalized weakness    Home bound Palliative status       Relevant Orders   Amb Referral to  Palliative Care   History of CVA (cerebrovascular accident) - Primary    With R sided hemiparesis   Now homebound with palliative status  Did graduate from hospice  Will place palliative care referral if needed  Does not desire aggressive life saving treatment  Family is able to do transfers with a lift        Relevant Orders   Amb Referral to Palliative Care   Palliative care status    Pt wishes to stay home with comfort care as opposed to aggressive management of complex medical problems She was d/c from hospice Referral for palliative care done  Family cares for her at home      Relevant Orders   Amb Referral to Palliative Care     Follow Up Instructions: See AVS   I discussed the assessment and treatment plan with the patient. The patient was provided an opportunity to ask questions and all were answered. The patient agreed with the plan and demonstrated an understanding of the instructions.   The patient was advised to call back or seek an in-person evaluation if the symptoms worsen or if the condition fails to improve as anticipated.     Roxy Manns, MD

## 2021-06-04 NOTE — Assessment & Plan Note (Signed)
Encouraged to keep fluids up  ?No longer on losartan ?Palliative status ?

## 2021-06-04 NOTE — Assessment & Plan Note (Signed)
Pt wishes to stay home with comfort care as opposed to aggressive management of complex medical problems ?She was d/c from hospice ?Referral for palliative care done  ?Family cares for her at home ?

## 2021-06-04 NOTE — Assessment & Plan Note (Addendum)
bp in fair control at this time  ?BP: 135/82  ? ?No changes needed ?Most recent labs reviewed  ?Disc lifstyle change with low sodium diet and exercise  ?Taking hctz 12.5 mg daily and amlodipine 10 mg daily  ?Acceptable with parameter of palliative/comfort care status ?No longer taking losartan ?Will need to schedule drive through labs ?

## 2021-06-09 ENCOUNTER — Telehealth: Payer: Self-pay

## 2021-06-09 NOTE — Chronic Care Management (AMB) (Addendum)
Spoke with patient for description of BG monitor. Patient will get delivery of glucose testing strips from prescription  sent in to [facility]. Her current BG monitor was purchased from [facility] and is a Scientist, physiological blood glucose monitoring system.  [facility] does not carry supplies for the [employer] system. Message relayed to the patient  ? ?Al Corpus, [facility]  CPP notified ? ?Johnathon Olden, CCMA ?Clincal Pharmacy Assistant ?[phone] ? ?Total time spent for month ?CPA: 10 min ?

## 2021-06-17 ENCOUNTER — Telehealth: Payer: Self-pay | Admitting: Nurse Practitioner

## 2021-06-17 ENCOUNTER — Other Ambulatory Visit: Payer: Self-pay | Admitting: Family Medicine

## 2021-06-18 NOTE — Telephone Encounter (Signed)
Spoke with patient's daughter [name], regarding the Palliative referral/services and all questions were answered and she and patient were in agreement with beginning services.  I have scheduled a [facility] Palliative Consult for 06/25/21 @ 2:30 PM ?

## 2021-06-22 ENCOUNTER — Telehealth: Payer: Self-pay

## 2021-06-22 NOTE — Progress Notes (Signed)
? ? ?  Chronic Care Management ?Pharmacy Assistant  ? ?Name: Amanda Benson  MRN: [id number] DOB: [birth year] ? ?Reason for Encounter: CCM (Medication Adherence and Delivery Coordination) ?  ?Recent office visits:  ?06/04/2021 - Roxy Manns, MD - CVA - Discharged from hospice. No med changes.  ? ?Recent consult visits:  ?None since last CCM contact ? ?Hospital visits:  ?None in previous 6 months ? ?Medications: ?Outpatient Encounter Medications as of 06/22/2021  ?Medication Sig  ? amLODipine (NORVASC) 10 MG tablet TAKE ONE TABLET BY MOUTH EVERY MORNING  ? buPROPion (WELLBUTRIN XL) 300 MG 24 hr tablet TAKE 1 TABLET(300 MG) BY MOUTH DAILY  ? FLUoxetine (PROZAC) 40 MG capsule TAKE ONE CAPSULE BY MOUTH EVERY MORNING  ? glipiZIDE (GLUCOTROL XL) 10 MG 24 hr tablet TAKE ONE TABLET BY MOUTH EVERY MORNING  ? glucose blood (COOL BLOOD GLUCOSE TEST STRIPS) test strip To check blood glucose qd and prn for DM2  ? hydrochlorothiazide (HYDRODIURIL) 12.5 MG tablet Take 1 tablet (12.5 mg total) by mouth every morning.  ? Insulin Pen Needle 29G X MISC Use with insulin daily  ? raloxifene (EVISTA) 60 MG tablet TAKE 1 TABLET(60 MG) BY MOUTH DAILY  ? ?No facility-administered encounter medications on file as of 06/22/2021.  ? ?BP Readings from Last 3 Encounters:  ?06/04/21 135/82  ?10/30/20 (!) 152/70  ?10/22/20 (!) 148/59  ?  ?Lab Results  ?Component Value Date  ? HGBA1C 8.0 (H) 06/21/2020  ?  ?Recent OV, Consult or Hospital visit:  ?No medication changes indicated ? ?Last adherence delivery date: 06/03/2021     ? ?Patient is due for next adherence delivery on: 07/02/2021 ? ?Multiple attempts made to reach patient. Unsuccessful outreach. Will refill based off of last adherence fill.  ? ?This delivery to include: Adherence Packaging  30 Days  ?Packs: ?Raloxifene 60mg  -1 tablet daily (1 breakfast) ?Bupropion 300mg  24 hr-1 tablet daily (1 breakfast) ?Amlodipine 10mg - 1 tablet daily (1 breakfast) ?Glipizide 10mg  XL - 1 tablet daily (1  breakfast) ?Fluoxetine 40mg  - 1 capsule daily (1 breakfast) ?Trazodone 50mg   take 2  tablet bedtime (2 bedtime) ?Hydrochlorothiazide 12.5 mg- 1 tablet (1 breakfast) ?  ?VIAL medications: ?Womens multivitamin- chew 2 tablets every morning  ?Tramadol 50mg - take 2 tablets every  8 hours as needed for pain  ?   ?Refills requested from providers include: ?Raloxifene 60mg  -1 tablet daily (1 breakfast) ?Bupropion 300mg  24 hr-1 tablet daily (1 breakfast) ?Trazodone 50mg   take 2  tablet bedtime (2 bedtime) ?Tramadol 50mg - take 2 tablets every  8 hours as needed for pain  ? ?Delivery scheduled for 07/02/2021. Unable to speak with patient to confirm date.   ? ?Annual wellness visit in last year? No ?Most Recent BP reading: 135/82 on 06/04/2021 ? ?If Diabetic: ?Most recent A1C reading:  8.0 on 06/21/2020 ?Last eye exam / retinopathy screening: Up to date ?Last diabetic foot exam: 05/12/2020 ? ? , CPP notified ? ? , RMA ?Clinical Pharmacy Assistant ?[phone] ? ? ? ?

## 2021-06-25 ENCOUNTER — Telehealth: Payer: Medicare HMO | Admitting: Nurse Practitioner

## 2021-06-25 ENCOUNTER — Encounter: Payer: Self-pay | Admitting: Nurse Practitioner

## 2021-06-25 ENCOUNTER — Other Ambulatory Visit: Payer: Self-pay

## 2021-06-25 DIAGNOSIS — T7840XA Allergy, unspecified, initial encounter: Secondary | ICD-10-CM

## 2021-06-25 DIAGNOSIS — Z8673 Personal history of transient ischemic attack (TIA), and cerebral infarction without residual deficits: Secondary | ICD-10-CM

## 2021-06-25 DIAGNOSIS — R5381 Other malaise: Secondary | ICD-10-CM

## 2021-06-25 NOTE — Progress Notes (Signed)
? ? ?Civil engineer, contracting ?Community Palliative Care Consult Note ?Telephone: [phone]  ?Fax: [phone]  ? ?Date of encounter: 06/25/21 ?3:19 PM ?PATIENT NAME: Amanda Benson ?[address] ?[place] [address]   ?[phone] (home)  ?DOB: 12/05/1935 ?MRN: [id number] ?PRIMARY CARE PROVIDER:    ?Tower, Audrie Gallus, MD,  ?[address] [place] [address] ?[phone] ? ?REFERRING PROVIDER:   ?Tower, Audrie Gallus, MD ?[address] [place] ?[place],  [place] [address] ?[phone] ? ?RESPONSIBLE PARTY:    ?Contact Information   ? ? Name Relation Home Work Mobile  ? turner,jennifer Daughter   [phone]  ? ?  ? ?Due to the COVID-19 crisis, this visit was done via telemedicine from my office and it was initiated and consent by this patient and or family. ? ?I connected with  Amanda Benson OR PROXY on 06/25/21 by a video enabled telemedicine application and verified that I am speaking with the correct person using two identifiers. ?  ?I discussed the limitations of evaluation and management by telemedicine. The patient expressed understanding and agreed to proceed. Palliative Care was asked to follow this patient by consultation request of  Tower, Audrie Gallus, MD to address advance care planning and complex medical decision making. This is the initial visit.                              ?ASSESSMENT AND PLAN / RECOMMENDATIONS:  ?Advance Care Planning/Goals of Care: Goals include to maximize quality of life and symptom management. Patient/health care surrogate gave his/her permission to discuss.Our advance care planning conversation included a discussion about:    ?The value and importance of advance care planning  ?Experiences with loved ones who have been seriously ill or have died  ?Exploration of personal, cultural or spiritual beliefs that might influence medical decisions  ?Exploration of goals of care in the event of a sudden injury or illness  ?Identification  of a healthcare agent  ?Review and  updating or creation of an  advance directive document . ?Decision not to resuscitate or to de-escalate disease focused treatments due to poor prognosis. ?CODE STATUS: DNR ? ?Symptom Management/Plan: ?1. Advance Care Planning;  DNR ? ?2. Debility secondary to late onset CVA; encourage mobility, fall precaution, restorative exercises ? ?3. Allergies, discussed with Amanda Benson and daughter will try claritan, send rx ?Called in to [facility] (not available on iscribe) ? ?Clartin 10mg  po qd; #30; 1RF ? ?4. Goals of Care: Goals include to maximize quality of life and symptom management. Our advance care planning conversation included a discussion about:    ?The value and importance of advance care planning  ?Exploration of personal, cultural or spiritual beliefs that might influence medical decisions  ?Exploration of goals of care in the event of a sudden injury or illness  ?Identification and preparation of a healthcare agent  ?Review and updating or creation of an advance directive document. ? ?5. Palliative care encounter; Palliative care encounter; Palliative medicine team will continue to support patient, patient's family, and medical team. Visit consisted of counseling and education dealing with the complex and emotionally intense issues of symptom management and palliative care in the setting of serious and potentially life-threatening illness ? ?Follow up Palliative Care Visit: Palliative care will continue to follow for complex medical decision making, advance care planning, and clarification of goals. Return 2 weeks or prn. ? ?I spent 41 minutes providing this  consultation. More than 50% of the time in this consultation was spent in counseling and care coordination. ? ?PPS: 30% ?Chief Complaint: Initial palliative consult for complex medical decision making ? ?HISTORY OF PRESENT ILLNESS:  Amanda Benson is a 86 y.o. year old female  with multiple medical problems including late onset CVA, HTN,  DM. Recently d/c from hospice services due to stability. I connected with Amanda Benson and daughter Amanda Benson via video for initial PC consult. Amanda Benson was sitting up in bed with her daughter, Amanda Benson next to her. We talked about pc, what is provided. We talked about past medical history, ros, symptoms including PND. Amanda Benson endorses all her family members have allergies now. We talked about option of trying Claritin. Amanda Benson is in agreement, will call in rx. We talked about hospice d/c. We talked about pc does not provide supplies such as briefs, diapers, wipes. We talked about medical goals. We talked about f/u pc visit in 2 weeks to see if improvement of allergy symptoms. We talked about option of PC SW to connect with Amanda Benson to see what resources are available to Amanda Benson including any free supplies. Discussed the difficulty with mobility, family having to lift her to put her in the chair, car. We talked about using an in home primary provider for primary care. Amanda Benson and Amanda Benson in agreement, will send referral to Marletta LorJulie Barr NP [facility]. Therapeutic listening, emotional support provided. Questions answered.  ? ?History obtained from review of EMR, discussion with  ?Amanda Benson.  ?I reviewed available labs, medications, imaging, studies and related documents from the EMR.  Records reviewed and summarized above.  ? ?ROS ?10 point system reviewed with Ms. Clute and daughter, all negative except HPI ? ?Physical Exam: ?Deferred  ?CURRENT PROBLEM LIST:  ?Patient Active Problem List  ? Diagnosis Date Noted  ? History of CVA (cerebrovascular accident) 06/04/2021  ? Hyperlipidemia associated with type 2 diabetes mellitus (HCC) 06/04/2021  ? Fatigue 10/05/2020  ? Hypercalcemia 10/03/2020  ? Swallowing difficulty 10/03/2020  ? Hypophosphatemia   ? Hypomagnesemia   ? Hypokalemia   ? B12 deficiency   ? Hyponatremia 06/21/2020  ? H/O sepsis 12/09/2019  ? CKD (chronic kidney disease) 11/23/2019  ? Leukocytosis  11/23/2019  ? Mobility impaired 10/13/2019  ? Incontinence of urine 10/12/2019  ? Grief reaction 03/27/2019  ? Elevated serum creatinine 06/13/2018  ? Dysuria 06/13/2018  ? Hearing loss 01/02/2018  ? Subclinical hypothyroidism 10/26/2017  ? Anxiety and depression 08/24/2016  ? Falls frequently 08/24/2016  ? Generalized weakness 08/24/2016  ? Palliative care status 05/16/2015  ? Breast calcifications on mammogram 03/01/2014  ? Encounter for Medicare annual wellness exam 05/30/2013  ? Breast calcification seen on mammogram 12/14/2012  ? Breast calcification, left 11/28/2012  ? History of falling 11/27/2012  ? Poor balance 11/27/2012  ? Colon cancer screening 05/10/2012  ? Osteoporosis 06/21/2007  ? Type 2 diabetes mellitus with diabetic neuropathy, unspecified (HCC) 09/20/2006  ? Essential hypertension 09/20/2006  ? Osteoarthritis 09/20/2006  ? ?PAST MEDICAL HISTORY:  ?Active Ambulatory Problems  ?  Diagnosis Date Noted  ? Type 2 diabetes mellitus with diabetic neuropathy, unspecified (HCC) 09/20/2006  ? Essential hypertension 09/20/2006  ? Osteoarthritis 09/20/2006  ? Osteoporosis 06/21/2007  ? Colon cancer screening 05/10/2012  ? History of falling 11/27/2012  ? Poor balance 11/27/2012  ? Breast calcification, left 11/28/2012  ? Breast calcification seen on mammogram 12/14/2012  ? Encounter for Medicare annual wellness exam 05/30/2013  ?  Breast calcifications on mammogram 03/01/2014  ? Palliative care status 05/16/2015  ? Anxiety and depression 08/24/2016  ? Falls frequently 08/24/2016  ? Generalized weakness 08/24/2016  ? Subclinical hypothyroidism 10/26/2017  ? Hearing loss 01/02/2018  ? Elevated serum creatinine 06/13/2018  ? Dysuria 06/13/2018  ? Grief reaction 03/27/2019  ? Incontinence of urine 10/12/2019  ? Mobility impaired 10/13/2019  ? CKD (chronic kidney disease) 11/23/2019  ? Leukocytosis 11/23/2019  ? H/O sepsis 12/09/2019  ? Hyponatremia 06/21/2020  ? Hypomagnesemia   ? Hypokalemia   ? B12 deficiency    ? Hypophosphatemia   ? Hypercalcemia 10/03/2020  ? Swallowing difficulty 10/03/2020  ? Fatigue 10/05/2020  ? History of CVA (cerebrovascular accident) 06/04/2021  ? Hyperlipidemia associated with ty

## 2021-07-06 DIAGNOSIS — I10 Essential (primary) hypertension: Secondary | ICD-10-CM | POA: Diagnosis not present

## 2021-07-22 ENCOUNTER — Telehealth: Payer: Self-pay

## 2021-07-22 DIAGNOSIS — I10 Essential (primary) hypertension: Secondary | ICD-10-CM

## 2021-07-22 NOTE — Chronic Care Management (AMB) (Signed)
? ? ?  Chronic Care Management ?Pharmacy Assistant  ? ?Name: Amanda Benson  MRN: [id number] DOB: 11/26/1935 ? ? ?Reason for Encounter: Medication Adherence and Delivery Coordination ? ?Recent office visits:  ?None since last CCM contact ? ?Recent consult visits:  ?06/25/21-[facility]-Telemedicine call-No medication changes  ? ?Hospital visits:  ?None in previous 6 months ? ?Medications: ?Outpatient Encounter Medications as of 07/22/2021  ?Medication Sig  ? amLODipine (NORVASC) 10 MG tablet TAKE ONE TABLET BY MOUTH EVERY MORNING  ? buPROPion (WELLBUTRIN XL) 300 MG 24 hr tablet TAKE 1 TABLET(300 MG) BY MOUTH DAILY  ? FLUoxetine (PROZAC) 40 MG capsule TAKE ONE CAPSULE BY MOUTH EVERY MORNING  ? glipiZIDE (GLUCOTROL XL) 10 MG 24 hr tablet TAKE ONE TABLET BY MOUTH EVERY MORNING  ? glucose blood (COOL BLOOD GLUCOSE TEST STRIPS) test strip To check blood glucose qd and prn for DM2  ? hydrochlorothiazide (HYDRODIURIL) 12.5 MG tablet Take 1 tablet (12.5 mg total) by mouth every morning.  ? Insulin Pen Needle 29G X MISC Use with insulin daily  ? raloxifene (EVISTA) 60 MG tablet TAKE 1 TABLET(60 MG) BY MOUTH DAILY  ? ?No facility-administered encounter medications on file as of 07/22/2021.  ? ? ?BP Readings from Last 3 Encounters:  ?06/04/21 135/82  ?10/30/20 (!) 152/70  ?10/22/20 (!) 148/59  ?  ?Lab Results  ?Component Value Date  ? HGBA1C 8.0 (H) 06/21/2020  ?  ? ? ?Recent OV, Consult or Hospital visit:  Palliative Care follow up call  ?No medication changes indicated ? ? ?Last adherence delivery date:07/02/21     ? ?Patient is due for next adherence delivery on: 08/03/21 ? ?Spoke with patient on 07/23/21 reviewed medications and coordinated delivery. Spoke with daughter Amanda Benson ? ?This delivery to include: Adherence Packaging  30 Days  ?Packs: ?Raloxifene 60mg  -1 tablet daily (1 breakfast) ?Bupropion 300mg  24 hr-1 tablet daily (1 breakfast) ?Amlodipine 10mg - 1 tablet daily (1 breakfast) ?Glipizide 10mg  XL - 1  tablet daily (1 breakfast) ?Fluoxetine 40mg  - 1 capsule daily (1 breakfast) ?Trazodone 50mg   take 2  tablet bedtime (2 bedtime) ?Hydrochlorothiazide 12.5 mg- 1 tablet (1 breakfast ? ?VIAL medications: ?Womens multivitamin- chew 2 tablets every morning  ?Claritan 10mg - take 1 tablet daily as needed ? ?Patient declined the following medications this month: ?Tramadol 50mg - take 2 tablets every  8 hours as needed for pain- patient has supply on hand  ? ? ?Any concerns about your medications? No ? ?How often do you forget or accidentally miss a dose? Never ? ?Do you use a pillbox? No ? ?Is patient in packaging Yes ? What is the date on your next pill pack? Not home to look at package date ? Any concerns or issues with your packaging? No concerns at this time  ? ? ?Refills requested from providers include: Trazodone,Bupropion,Raloxifene,HCTZ, ? ?Confirmed delivery date of 08/03/21, advised patient that pharmacy will contact them the morning of delivery. ? ?Recent blood pressure readings are as follows:none available  ? ? ?Recent blood glucose readings are as follows:none available, patient is out of strips, has to come from [facility], will get some today  ? ? ? ?Annual wellness visit in last year? No ?Most Recent BP reading:135/82 ? ?If Diabetic: ?Most recent A1C reading:  8.0 on 06/21/2020 ?Last eye exam / retinopathy screening: Up to date ?Last diabetic foot exam: 05/12/2020 ? ? ? , CPP notified ? ?Amanda Benson, CCMA ?Health concierge  ?[phone]  ?

## 2021-07-30 MED ORDER — BUPROPION HCL ER (XL) 300 MG PO TB24: ORAL_TABLET | ORAL | 1 refills | Status: AC

## 2021-07-30 MED ORDER — HYDROCHLOROTHIAZIDE 12.5 MG PO TABS: 12.5000 mg | ORAL_TABLET | Freq: Every morning | ORAL | 1 refills | Status: AC

## 2021-07-30 MED ORDER — TRAZODONE HCL 50 MG PO TABS: 100.0000 mg | ORAL_TABLET | Freq: Every evening | ORAL | 3 refills | Status: AC | PRN

## 2021-07-30 MED ORDER — RALOXIFENE HCL 60 MG PO TABS: ORAL_TABLET | ORAL | 1 refills | Status: AC

## 2021-07-30 NOTE — Telephone Encounter (Signed)
The trazodone was not on her med list, can you clarify? ?Thanks  ?

## 2021-07-30 NOTE — Telephone Encounter (Signed)
Trazodone not on med list  ?

## 2021-07-30 NOTE — Telephone Encounter (Addendum)
It looks like she has been taking trazodone 50 mg - 2 tablets daily at bedtime for several months now. ? ?I did some digging - it was prescribed by Dr Delfino Lovett with [facility] back in Dec 2022. She is not seeing them anymore so would need PCP to take over prescribing. ?

## 2021-07-30 NOTE — Addendum Note (Signed)
Addended by: Roxy Manns A on: 07/30/2021 03:29 PM ? ? Modules accepted: Orders ? ?

## 2021-07-30 NOTE — Addendum Note (Signed)
Addended by: Shon Millet on: 07/30/2021 03:05 PM ? ? Modules accepted: Orders ? ?

## 2021-07-30 NOTE — Addendum Note (Signed)
Addended by: Roxy Manns A on: 07/30/2021 04:23 PM ? ? Modules accepted: Orders ? ?

## 2021-07-30 NOTE — Telephone Encounter (Signed)
Got it!     Thanks =).

## 2021-07-30 NOTE — Telephone Encounter (Signed)
Refills needed for: ? ?Hydrochlorothiazide 12.5 mg ?Raloxifene 60 mg ?Bupropion XL 300 mg ?Trazodone 50 mg ? ? ?[facility] - [place], [place] - [address] ?[address] ?[place] [address] ?Phone: [phone] Fax: [phone] ? ? ?Routing to PCP refill pool. ?

## 2021-08-11 DIAGNOSIS — I10 Essential (primary) hypertension: Secondary | ICD-10-CM | POA: Diagnosis not present

## 2021-08-11 DIAGNOSIS — E1165 Type 2 diabetes mellitus with hyperglycemia: Secondary | ICD-10-CM | POA: Diagnosis not present

## 2021-08-20 ENCOUNTER — Telehealth: Payer: Self-pay

## 2021-08-20 DIAGNOSIS — E1140 Type 2 diabetes mellitus with diabetic neuropathy, unspecified: Secondary | ICD-10-CM

## 2021-08-20 MED ORDER — BLOOD GLUCOSE MONITOR KIT: PACK | 0 refills | Status: DC

## 2021-08-20 MED ORDER — COOL BLOOD GLUCOSE TEST STRIPS VI STRP
ORAL_STRIP | 3 refills | Status: AC
Start: 2021-08-20 — End: ?

## 2021-08-20 NOTE — Progress Notes (Signed)
Chronic Care Management Pharmacy Assistant   Name: Amanda Benson  MRN: [id number] DOB: 08-24-1935  Reason for Encounter: CCM (Medication Adherence and Delivery Coordination)   Recent office visits:  NONE SINCE LAST CCM CONTACT  Recent consult visits:  NONE SINCE LAST CCM CONTACT  Hospital visits:  None in previous 6 months  Medications: Outpatient Encounter Medications as of 08/20/2021  Medication Sig   amLODipine (NORVASC) 10 MG tablet TAKE ONE TABLET BY MOUTH EVERY MORNING   buPROPion (WELLBUTRIN XL) 300 MG 24 hr tablet TAKE 1 TABLET(300 MG) BY MOUTH DAILY   FLUoxetine (PROZAC) 40 MG capsule TAKE ONE CAPSULE BY MOUTH EVERY MORNING   glipiZIDE (GLUCOTROL XL) 10 MG 24 hr tablet TAKE ONE TABLET BY MOUTH EVERY MORNING   glucose blood (COOL BLOOD GLUCOSE TEST STRIPS) test strip To check blood glucose qd and prn for DM2   hydrochlorothiazide (HYDRODIURIL) 12.5 MG tablet Take 1 tablet (12.5 mg total) by mouth every morning.   Insulin Pen Needle 29G X MISC Use with insulin daily   raloxifene (EVISTA) 60 MG tablet TAKE 1 TABLET(60 MG) BY MOUTH DAILY   traZODone (DESYREL) 50 MG tablet Take 2 tablets (100 mg total) by mouth at bedtime as needed for sleep.   No facility-administered encounter medications on file as of 08/20/2021.   BP Readings from Last 3 Encounters:  06/04/21 135/82  10/30/20 (!) 152/70  10/22/20 (!) 148/59    Lab Results  Component Value Date   HGBA1C 8.0 (H) 06/21/2020    No OVs, Consults, or hospital visits since last care coordination call / Pharmacist visit. No medication changes indicated  Last adherence delivery date: 08/03/2021  Patient is due for next adherence delivery on: 09/01/2021  Spoke with patient on 08/20/2021 reviewed medications and coordinated delivery.  This delivery to include: Adherence Packaging  30 Days  Packs: Amlodipine 10mg - 1 tablet daily (1 breakfast) Glipizide 10mg  XL - 1 tablet daily (1 breakfast) Fluoxetine 40mg  - 1  capsule daily (1 breakfast) Hydrochlorothiazide 12.5 mg- 1 tablet (1 breakfast Raloxifene 60mg  -1 tablet daily (1 breakfast) Bupropion 300mg  24 hr-1 tablet daily (1 breakfast) Trazodone 50mg   take 2  tablet bedtime (2 bedtime)   VIAL medications: Womens multivitamin- chew 2 tablets every morning  Claritin 10mg - take 1 tablet daily as needed   Patient declined the following medications this month: Tramadol 50mg - take 2 tablets every  8 hours as needed for pain- patient has supply on hand - 3 bottles    Any concerns about your medications? No   How often do you forget or accidentally miss a dose? Never   Do you use a pillbox? No   Is patient in packaging Yes  If yes  What is the date on your next pill pack? 08/20/2021 bedtime  Any concerns or issues with your packaging? No   Refills requested from providers include: Claritan 10mg - take 1 tablet daily as needed  Confirmed delivery date of 09/01/2021, advised patient that pharmacy will contact them the morning of delivery.   Recent blood pressure readings are as follows: Spoke with patient's daughter. She did not have readings, but stated her mom's blood pressure has been ranging from 149/74 - 129/70.  Recent blood glucose readings are as follows: Patient has not been checking her blood glucose at home due to not being able to afford the strips.   Annual wellness visit in last year? No Most Recent BP reading: 135/82 on 06/04/2021  If Diabetic: Most recent  A1C reading: 8.0 on 06/21/2020 Last eye exam / retinopathy screening: Up to date Last diabetic foot exam: 05/12/2020  Al Corpus, CPP notified  Claudina Lick, [place] Clinical Pharmacy Assistant [phone]

## 2021-09-07 ENCOUNTER — Ambulatory Visit (INDEPENDENT_AMBULATORY_CARE_PROVIDER_SITE_OTHER): Payer: Medicare HMO

## 2021-09-07 VITALS — Wt 140.0 lb

## 2021-09-07 DIAGNOSIS — Z0000 Encounter for general adult medical examination without abnormal findings: Secondary | ICD-10-CM | POA: Diagnosis not present

## 2021-09-07 NOTE — Progress Notes (Signed)
Virtual Visit via Telephone Note  I connected with  Amanda Benson on 09/07/21 at  1:30 PM EDT by telephone and verified that I am speaking with the correct person using two identifiers.Spoke w/ daughter Amanda Benson  Location: Patient: home Provider: [place] Persons participating in the virtual visit: [place]   I discussed the limitations, risks, security and privacy concerns of performing an evaluation and management service by telephone and the availability of in person appointments. The patient expressed understanding and agreed to proceed.  Interactive audio and video telecommunications were attempted between this nurse and patient, however failed, due to patient having technical difficulties OR patient did not have access to video capability.  We continued and completed visit with audio only.  Some vital signs may be absent or patient reported.   Amanda David, LPN  Subjective:   Amanda Benson is a 86 y.o. female who presents for Medicare Annual (Subsequent) preventive examination.  Review of Systems           Objective:    There were no vitals filed for this visit. There is no height or weight on file to calculate BMI.     10/21/2020    6:09 PM 09/29/2020    8:20 AM 09/04/2020    6:53 PM 06/21/2020    8:12 PM 11/25/2019    3:00 PM 08/29/2018   10:44 AM 08/22/2017   10:51 AM  Advanced Directives  Does Patient Have a Medical Advance Directive? No No No Unable to assess, patient is non-responsive or altered mental status Yes Yes No  Type of Advance Directive     Living will [place];Living will   Does patient want to make changes to medical advance directive?     No - Patient declined No - Patient declined Yes (MAU/Ambulatory/Procedural Areas - Information given)  Copy of [place] in Chart?      Yes - validated most recent copy scanned in chart (See row information)   Would patient like information on  creating a medical advance directive?   No - Patient declined  No - Patient declined No - Patient declined     Current Medications (verified) Outpatient Encounter Medications as of 09/07/2021  Medication Sig   Accu-Chek Softclix Lancets lancets SMARTSIG:Topical 1-4 Times Daily   amLODipine (NORVASC) 10 MG tablet TAKE ONE TABLET BY MOUTH EVERY MORNING   blood glucose meter kit and supplies KIT Dispense based on patient and insurance preference. Use up to four times daily as directed.   buPROPion (WELLBUTRIN XL) 300 MG 24 hr tablet TAKE 1 TABLET(300 MG) BY MOUTH DAILY   FLUoxetine (PROZAC) 40 MG capsule TAKE ONE CAPSULE BY MOUTH EVERY MORNING   glipiZIDE (GLUCOTROL XL) 10 MG 24 hr tablet TAKE ONE TABLET BY MOUTH EVERY MORNING   glucose blood (COOL BLOOD GLUCOSE TEST STRIPS) test strip To check blood glucose qd and prn for DM2   hydrochlorothiazide (HYDRODIURIL) 12.5 MG tablet Take 1 tablet (12.5 mg total) by mouth every morning.   Insulin Pen Needle 29G X 12MM MISC Use with insulin daily   loratadine (CLARITIN) 10 MG tablet Take 10 mg by mouth daily.   raloxifene (EVISTA) 60 MG tablet TAKE 1 TABLET(60 MG) BY MOUTH DAILY   traMADol (ULTRAM) 50 MG tablet Take 100 mg by mouth every 8 (eight) hours as needed.   traZODone (DESYREL) 50 MG tablet Take 2 tablets (100 mg total) by mouth at bedtime as needed for  sleep.   No facility-administered encounter medications on file as of 09/07/2021.    Allergies (verified) Alendronate sodium   History: Past Medical History:  Diagnosis Date   Depression    Diabetes mellitus    type II   Hyperlipidemia    Hypertension    Obesity    Osteoporosis    Past Surgical History:  Procedure Laterality Date   ANKLE SURGERY     left medial malleblus fracture   CHOLECYSTECTOMY     Family History  Problem Relation Age of Onset   Diabetes Mother    Hypertension Mother    Asthma Father    Hypertension Sister    Cancer Sister        lung   Asthma Brother     COPD Brother    Cancer Maternal Grandmother        breast    Asthma Brother    Diabetes Mellitus II Daughter    COPD Daughter    Social History   Socioeconomic History   Marital status: Married    Spouse name: Not on file   Number of children: 7   Years of education: Not on file   Highest education level: Not on file  Occupational History    Employer: RETIRED  Tobacco Use   Smoking status: Never   Smokeless tobacco: Never  Vaping Use   Vaping Use: Never used  Substance and Sexual Activity   Alcohol use: No    Alcohol/week: 0.0 standard drinks   Drug use: No   Sexual activity: Not Currently  Other Topics Concern   Not on file  Social History Narrative   Not on file   Social Determinants of Health   Financial Resource Strain: Not on file  Food Insecurity: Not on file  Transportation Needs: Not on file  Physical Activity: Unknown   Days of Exercise per Week: Not on file   Minutes of Exercise per Session: Patient refused  Stress: No Stress Concern Present   Feeling of Stress : Not at all  Social Connections: Not on file    Tobacco Counseling Counseling given: Not Answered   Clinical Intake:  Pre-visit preparation completed: Yes        Diabetes: Yes CBG done?: No Did pt. bring in CBG monitor from home?: No  How often do you need to have someone help you when you read instructions, pamphlets, or other written materials from your doctor or pharmacy?: 1 - Never  Diabetic?yes Nutrition Risk Assessment:  Has the patient had any N/V/D within the last 2 months?  Yes  Does the patient have any non-healing wounds?  No  Has the patient had any unintentional weight loss or weight gain?  No   Diabetes:  Is the patient diabetic?  Yes  If diabetic, was a CBG obtained today?  No  Did the patient bring in their glucometer from home?  No  How often do you monitor your CBG's? Once per day   Financial Strains and Diabetes Management:  Are you having any  financial strains with the device, your supplies or your medication? No .  Does the patient want to be seen by Chronic Care Management for management of their diabetes?  No  Would the patient like to be referred to a Nutritionist or for Diabetic Management?  No   Diabetic Exams:  Diabetic Eye Exam: Completed 09/01/20. Overdue for diabetic eye exam. Pt has been advised about the importance in completing this exam. PT IS BEDBOUND  Diabetic Foot Exam: Completed 05/12/20. Pt has been advised about the importance in completing this exam.   Interpreter Needed?: No  Information entered by :: Kirke Shaggy, LPN   Activities of Daily Living     View : No data to display.          Patient Care Team: [place], [name], MD as PCP - General Debbora Dus, [facility] as Pharmacist (Pharmacist)  Indicate any recent Medical Services you may have received from other than [facility] providers in the past year (date may be approximate).     Assessment:   This is a routine wellness examination for [place].  Hearing/Vision screen No results found.  Dietary issues and exercise activities discussed:     Goals Addressed             This Visit's Progress    DIET - EAT MORE FRUITS AND VEGETABLES         Depression Screen    09/07/2021    1:36 PM 08/29/2018    4:51 PM 08/22/2017   10:48 AM 08/17/2016   10:58 AM 05/16/2015    4:05 PM 05/30/2013    2:31 PM 05/11/2012   10:31 AM  PHQ 2/9 Scores  PHQ - 2 Score $Remov'2 3 2 1 'fNnxvV$ 0 0 0  PHQ- 9 Score $Remov'2 4 11        'rOSvZF$ Fall Risk    02/05/2020    3:02 PM 08/29/2018    4:51 PM 08/22/2017   10:48 AM 08/17/2016   10:58 AM 05/16/2015    4:05 PM  Fall Risk   Falls in the past year? 1 0 Yes Yes No  Comment   lost balance and fell; denies injury 4 falls per daughter; no medical treatment   Number falls in past yr: $Remove'1  1 2 'loUygQG$ or more   Injury with Fall? 0  No No   Risk for fall due to :    Impaired balance/gait;Impaired mobility     FALL RISK PREVENTION PERTAINING TO THE  HOME:  Any stairs in or around the home? No  If so, are there any without handrails? No  Home free of loose throw rugs in walkways, pet beds, electrical cords, etc? Yes  Adequate lighting in your home to reduce risk of falls? Yes   ASSISTIVE DEVICES UTILIZED TO PREVENT FALLS:  Life alert? No  Use of a cane, walker or w/c? No  Grab bars in the bathroom? No  Shower chair or bench in shower? No  Elevated toilet seat or a handicapped toilet? No    Cognitive Function:PT UNABLE TO TEST      08/29/2018    4:51 PM 08/22/2017   10:50 AM 08/17/2016   10:50 AM  MMSE - Mini Mental State Exam  Orientation to time $Remov'5 5 5  'ABJQjs$ Orientation to Place $Remove'5 5 5  'UafsvmM$ Registration $Remov'3 3 3  'sZmLMJ$ Attention/ Calculation 0 0 0  Recall $Remov'3 3 3  'nuRZtP$ Language- name 2 objects 0 0 0  Language- repeat $RemoveBeforeDE'1 1 1  'GBjCNdtmYifgcrU$ Language- follow 3 step command 0 3 3  Language- read & follow direction 0 0 0  Write a sentence 0 0 0  Copy design 0 0 0  Total score $RemoveBef'17 20 20        'bNJZprzFMv$ Immunizations Immunization History  Administered Date(s) Administered   Fluad Quad(high Dose 65+) 03/27/2019, 02/05/2020   Influenza Split 02/18/2011   Influenza Whole 01/12/2002, 04/08/2009, 01/05/2010   Influenza,inj,Quad PF,6+ Mos 05/10/2014, 02/12/2015, 02/24/2016, 06/08/2017, 03/14/2018  Moderna Sars-Covid-2 Vaccination 03/11/2020, 04/18/2020   Pneumococcal Conjugate-13 02/12/2015   Pneumococcal Polysaccharide-23 11/26/2011   Td 10/30/2002, 05/10/2012    TDAP status: Up to date  Flu Vaccine status: Due, Education has been provided regarding the importance of this vaccine. Advised may receive this vaccine at local pharmacy or Health Dept. Aware to provide a copy of the vaccination record if obtained from local pharmacy or Health Dept. Verbalized acceptance and understanding.  Pneumococcal vaccine status: Up to date  Covid-19 vaccine status: Completed vaccines  Qualifies for Shingles Vaccine? Yes   Zostavax completed No   Shingrix Completed?: No.     Education has been provided regarding the importance of this vaccine. Patient has been advised to call insurance company to determine out of pocket expense if they have not yet received this vaccine. Advised may also receive vaccine at local pharmacy or Health Dept. Verbalized acceptance and understanding.  Screening Tests Health Maintenance  Topic Date Due   Zoster Vaccines- Shingrix (1 of 2) Never done   MAMMOGRAM  03/22/2015   COVID-19 Vaccine (3 - Booster for Moderna series) 06/13/2020   HEMOGLOBIN A1C  12/22/2020   FOOT EXAM  05/12/2021   OPHTHALMOLOGY EXAM  09/01/2021   INFLUENZA VACCINE  11/03/2021   TETANUS/TDAP  05/10/2022   Pneumonia Vaccine 15+ Years old  Completed   DEXA SCAN  Completed   HPV VACCINES  Aged Out    Health Maintenance  Health Maintenance Due  Topic Date Due   Zoster Vaccines- Shingrix (1 of 2) Never done   MAMMOGRAM  03/22/2015   COVID-19 Vaccine (3 - Booster for Moderna series) 06/13/2020   HEMOGLOBIN A1C  12/22/2020   FOOT EXAM  05/12/2021   OPHTHALMOLOGY EXAM  09/01/2021    Colorectal cancer screening: No longer required.   Mammogram status: No longer required due to AGE.   Lung Cancer Screening: (Low Dose CT Chest recommended if Age 38-80 years, 30 pack-year currently smoking OR have quit w/in 15years.) does not qualify.     Additional Screening:  Hepatitis C Screening: does not qualify; Completed NO  Vision Screening: Recommended annual ophthalmology exams for early detection of glaucoma and other disorders of the eye. Is the patient up to date with their annual eye exam?  Yes  Who is the provider or what is the name of the office in which the patient attends annual eye exams? [place] If pt is not established with a provider, would they like to be referred to a provider to establish care? No .   Dental Screening: Recommended annual dental exams for proper oral hygiene  Community Resource Referral / Chronic Care  Management: CRR required this visit?  No   CCM required this visit?  No      Plan:     I have personally reviewed and noted the following in the patient's chart:   Medical and social history Use of alcohol, tobacco or illicit drugs  Current medications and supplements including opioid prescriptions.  Functional ability and status Nutritional status Physical activity Advanced directives List of other physicians Hospitalizations, surgeries, and ER visits in previous 12 months Vitals Screenings to include cognitive, depression, and falls Referrals and appointments  In addition, I have reviewed and discussed with patient certain preventive protocols, quality metrics, and best practice recommendations. A written personalized care plan for preventive services as well as general preventive health recommendations were provided to patient.     Amanda David, LPN   11/06/4194   Nurse  Notes:

## 2021-09-07 NOTE — Patient Instructions (Signed)
[name] , Thank you for taking time to come for your Medicare Wellness Visit. I appreciate your ongoing commitment to your health goals. Please review the following plan we discussed and let me know if I can assist you in the future.   Screening recommendations/referrals: Colonoscopy: aged out Mammogram: aged out Bone Density: declined Recommended yearly ophthalmology/optometry visit for glaucoma screening and checkup Recommended yearly dental visit for hygiene and checkup  Vaccinations: Influenza vaccine: n/d Pneumococcal vaccine: 02/12/15 Tdap vaccine: 05/10/12 Shingles vaccine: n/d   Covid-19:03/11/20, 04/18/20  Advanced directives: no  Conditions/risks identified: none  Next appointment: Follow up in one year for your annual wellness visit 09/09/22 @ 11:30 am by phone   Preventive Care 65 Years and Older, Female Preventive care refers to lifestyle choices and visits with your health care provider that can promote health and wellness. What does preventive care include? A yearly physical exam. This is also called an annual well check. Dental exams once or twice a year. Routine eye exams. Ask your health care provider how often you should have your eyes checked. Personal lifestyle choices, including: Daily care of your teeth and gums. Regular physical activity. Eating a healthy diet. Avoiding tobacco and drug use. Limiting alcohol use. Practicing safe sex. Taking low-dose aspirin every day. Taking vitamin and mineral supplements as recommended by your health care provider. What happens during an annual well check? The services and screenings done by your health care provider during your annual well check will depend on your age, overall health, lifestyle risk factors, and family history of disease. Counseling  Your health care provider may ask you questions about your: Alcohol use. Tobacco use. Drug use. Emotional well-being. Home and relationship well-being. Sexual  activity. Eating habits. History of falls. Memory and ability to understand (cognition). Work and work Astronomer. Reproductive health. Screening  You may have the following tests or measurements: Height, weight, and BMI. Blood pressure. Lipid and cholesterol levels. These may be checked every 5 years, or more frequently if you are over 13 years old. Skin check. Lung cancer screening. You may have this screening every year starting at age 57 if you have a 30-pack-year history of smoking and currently smoke or have quit within the past 15 years. Fecal occult blood test (FOBT) of the stool. You may have this test every year starting at age 18. Flexible sigmoidoscopy or colonoscopy. You may have a sigmoidoscopy every 5 years or a colonoscopy every 10 years starting at age 62. Hepatitis C blood test. Hepatitis B blood test. Sexually transmitted disease (STD) testing. Diabetes screening. This is done by checking your blood sugar (glucose) after you have not eaten for a while (fasting). You may have this done every 1-3 years. Bone density scan. This is done to screen for osteoporosis. You may have this done starting at age 58. Mammogram. This may be done every 1-2 years. Talk to your health care provider about how often you should have regular mammograms. Talk with your health care provider about your test results, treatment options, and if necessary, the need for more tests. Vaccines  Your health care provider may recommend certain vaccines, such as: Influenza vaccine. This is recommended every year. Tetanus, diphtheria, and acellular pertussis (Tdap, Td) vaccine. You may need a Td booster every 10 years. Zoster vaccine. You may need this after age 34. Pneumococcal 13-valent conjugate (PCV13) vaccine. One dose is recommended after age 4. Pneumococcal polysaccharide (PPSV23) vaccine. One dose is recommended after age 25. Talk to your health  care provider about which screenings and vaccines  you need and how often you need them. This information is not intended to replace advice given to you by your health care provider. Make sure you discuss any questions you have with your health care provider. Document Released: 04/18/2015 Document Revised: 12/10/2015 Document Reviewed: 01/21/2015 Elsevier Interactive Patient Education  2017 [place] Prevention in the Home Falls can cause injuries. They can happen to people of all ages. There are many things you can do to make your home safe and to help prevent falls. What can I do on the outside of my home? Regularly fix the edges of walkways and driveways and fix any cracks. Remove anything that might make you trip as you walk through a door, such as a raised step or threshold. Trim any bushes or trees on the path to your home. Use bright outdoor lighting. Clear any walking paths of anything that might make someone trip, such as rocks or tools. Regularly check to see if handrails are loose or broken. Make sure that both [place] of any steps have handrails. Any raised decks and porches should have guardrails on the edges. Have any leaves, snow, or ice cleared regularly. Use sand or salt on walking paths during winter. Clean up any spills in your garage right away. This includes oil or grease spills. What can I do in the bathroom? Use night lights. Install grab bars by the toilet and in the tub and shower. Do not use towel bars as grab bars. Use non-skid mats or decals in the tub or shower. If you need to sit down in the shower, use a plastic, non-slip stool. Keep the floor dry. Clean up any water that spills on the floor as soon as it happens. Remove soap buildup in the tub or shower regularly. Attach bath mats securely with double-sided non-slip rug tape. Do not have throw rugs and other things on the floor that can make you trip. What can I do in the bedroom? Use night lights. Make sure that you have a light by your bed that  is easy to reach. Do not use any sheets or blankets that are too big for your bed. They should not hang down onto the floor. Have a firm chair that has side arms. You can use this for support while you get dressed. Do not have throw rugs and other things on the floor that can make you trip. What can I do in the kitchen? Clean up any spills right away. Avoid walking on wet floors. Keep items that you use a lot in easy-to-reach places. If you need to reach something above you, use a strong step stool that has a grab bar. Keep electrical cords out of the way. Do not use floor polish or wax that makes floors slippery. If you must use wax, use non-skid floor wax. Do not have throw rugs and other things on the floor that can make you trip. What can I do with my stairs? Do not leave any items on the stairs. Make sure that there are handrails on both [place] of the stairs and use them. Fix handrails that are broken or loose. Make sure that handrails are as long as the stairways. Check any carpeting to make sure that it is firmly attached to the stairs. Fix any carpet that is loose or worn. Avoid having throw rugs at the top or bottom of the stairs. If you do have throw rugs, attach them to  the floor with carpet tape. Make sure that you have a light switch at the top of the stairs and the bottom of the stairs. If you do not have them, ask someone to add them for you. What else can I do to help prevent falls? Wear shoes that: Do not have high heels. Have rubber bottoms. Are comfortable and fit you well. Are closed at the toe. Do not wear sandals. If you use a stepladder: Make sure that it is fully opened. Do not climb a closed stepladder. Make sure that both [place] of the stepladder are locked into place. Ask someone to hold it for you, if possible. Clearly mark and make sure that you can see: Any grab bars or handrails. First and last steps. Where the edge of each step is. Use tools that help you  move around (mobility aids) if they are needed. These include: Canes. Walkers. Scooters. Crutches. Turn on the lights when you go into a dark area. Replace any light bulbs as soon as they burn out. Set up your furniture so you have a clear path. Avoid moving your furniture around. If any of your floors are uneven, fix them. If there are any pets around you, be aware of where they are. Review your medicines with your doctor. Some medicines can make you feel dizzy. This can increase your chance of falling. Ask your doctor what other things that you can do to help prevent falls. This information is not intended to replace advice given to you by your health care provider. Make sure you discuss any questions you have with your health care provider. Document Released: 01/16/2009 Document Revised: 08/28/2015 Document Reviewed: 04/26/2014 Elsevier Interactive Patient Education  2017 [employer].

## 2021-09-15 ENCOUNTER — Telehealth: Payer: Self-pay

## 2021-09-15 NOTE — Telephone Encounter (Signed)
PC SW outreached patients daughter, [name], to schedule f/u PC visit for Pearletha Furl, NP.  Call unsuccessful. SW LVM with NP contact information.

## 2021-09-18 ENCOUNTER — Telehealth: Payer: Self-pay

## 2021-09-18 NOTE — Chronic Care Management (AMB) (Unsigned)
Chronic Care Management Pharmacy Assistant   Name: Amanda Benson  MRN: [id number] DOB: 1935-06-02  Reason for Encounter: Medication Adherence and Delivery Coordination CPP  Recent office visits:  09/07/21-Lorrie Barnes,LPN(fam med)-Telemedicine- AWV,discuss screenings, vaccines, no medication changes  Recent consult visits:  None since last CCM contact  Hospital visits:  None in previous 6 months  Medications: Outpatient Encounter Medications as of 09/18/2021  Medication Sig   Accu-Chek Softclix Lancets lancets SMARTSIG:Topical 1-4 Times Daily   amLODipine (NORVASC) 10 MG tablet TAKE ONE TABLET BY MOUTH EVERY MORNING   blood glucose meter kit and supplies KIT Dispense based on patient and insurance preference. Use up to four times daily as directed.   buPROPion (WELLBUTRIN XL) 300 MG 24 hr tablet TAKE 1 TABLET(300 MG) BY MOUTH DAILY   FLUoxetine (PROZAC) 40 MG capsule TAKE ONE CAPSULE BY MOUTH EVERY MORNING   glipiZIDE (GLUCOTROL XL) 10 MG 24 hr tablet TAKE ONE TABLET BY MOUTH EVERY MORNING   glucose blood (COOL BLOOD GLUCOSE TEST STRIPS) test strip To check blood glucose qd and prn for DM2   hydrochlorothiazide (HYDRODIURIL) 12.5 MG tablet Take 1 tablet (12.5 mg total) by mouth every morning.   Insulin Pen Needle 29G X 12MM MISC Use with insulin daily   loratadine (CLARITIN) 10 MG tablet Take 10 mg by mouth daily.   raloxifene (EVISTA) 60 MG tablet TAKE 1 TABLET(60 MG) BY MOUTH DAILY   traMADol (ULTRAM) 50 MG tablet Take 100 mg by mouth every 8 (eight) hours as needed.   traZODone (DESYREL) 50 MG tablet Take 2 tablets (100 mg total) by mouth at bedtime as needed for sleep.   No facility-administered encounter medications on file as of 09/18/2021.    BP Readings from Last 3 Encounters:  06/04/21 135/82  10/30/20 (!) 152/70  10/22/20 (!) 148/59    Lab Results  Component Value Date   HGBA1C 8.0 (H) 06/21/2020      No OVs, Consults, or hospital visits since last care  coordination call / Pharmacist visit. No medication changes indicated   Last adherence delivery date:09/01/21      Patient is due for next adherence delivery on: 09/30/21  Multiple attempts made to reach patient. Unsuccessful outreach. Will refill based off of last adherence fill.   This delivery to include: Adherence Packaging  30 Days  Packs: Amlodipine 10mg - 1 tablet daily (1 breakfast) Glipizide 10mg  XL - 1 tablet daily (1 breakfast) Fluoxetine 40mg  - 1 capsule daily (1 breakfast) Hydrochlorothiazide 12.5 mg- 1 tablet (1 breakfast Raloxifene 60mg  -1 tablet daily (1 breakfast) Bupropion 300mg  24 hr-1 tablet daily (1 breakfast) Trazodone 50mg   take 2  tablet bedtime (2 bedtime)  VIAL medications: Womens multivitamin- chew 2 tablets every morning  Accuchek test strips Softclix lancets Lorazepam 0.5mg - take 1 tablet every 6 hours as needed Claritin 10mg - take 1 tablet daily as needed  Patient declined the following medications this month: Unavailable for information  There is a question from the pharmacy about [employer] and a charge.   Do you use a pillbox? No  Is patient in packaging Yes   Refills requested from providers include:  White scheduled for 09/30/21. Unable to speak with patient to confirm date.   Recent blood pressure readings are as follows:none available    Recent blood glucose readings are as follows:none available    Annual wellness visit in last year? Yes Most Recent BP reading:135/82  If Diabetic: Most recent A1C reading: 8.0 on 06/21/2020  Last eye exam / retinopathy screening: Up to date Last diabetic foot exam: 05/12/2020  Charlene Brooke, CPP notified  Avel Sensor, [place]  [phone]

## 2021-09-22 ENCOUNTER — Telehealth: Payer: Self-pay

## 2021-09-22 NOTE — Chronic Care Management (AMB) (Signed)
    Chronic Care Management Pharmacy Assistant   Name: Amanda Benson  MRN: [id number] DOB: [birth year]  Reason for Encounter: Reminder Call   Conditions to be addressed/monitored: HTN, HLD, and DMII  Office Visits:  09/07/21-Amanda Barnes,LPN (PCP)- Telemedicine -AWV,dicussed vaccines,screenings-no medication changes  06/04/21-Amanda Tower,MD(PCP)-Telemedicine-hospital f/u,schedule drive thru labs, -Amb Referral to Palliative Care  Consults: 06/25/21-Amanda Gusler,NP(palliative)-Telemedicine-Try Claritin for allergies.  Medications: Outpatient Encounter Medications as of 09/22/2021  Medication Sig   Accu-Chek Softclix Lancets lancets SMARTSIG:Topical 1-4 Times Daily   amLODipine (NORVASC) 10 MG tablet TAKE ONE TABLET BY MOUTH EVERY MORNING   blood glucose meter kit and supplies KIT Dispense based on patient and insurance preference. Use up to four times daily as directed.   buPROPion (WELLBUTRIN XL) 300 MG 24 hr tablet TAKE 1 TABLET(300 MG) BY MOUTH DAILY   FLUoxetine (PROZAC) 40 MG capsule TAKE ONE CAPSULE BY MOUTH EVERY MORNING   glipiZIDE (GLUCOTROL XL) 10 MG 24 hr tablet TAKE ONE TABLET BY MOUTH EVERY MORNING   glucose blood (COOL BLOOD GLUCOSE TEST STRIPS) test strip To check blood glucose qd and prn for DM2   hydrochlorothiazide (HYDRODIURIL) 12.5 MG tablet Take 1 tablet (12.5 mg total) by mouth every morning.   Insulin Pen Needle 29G X 12MM MISC Use with insulin daily   loratadine (CLARITIN) 10 MG tablet Take 10 mg by mouth daily.   raloxifene (EVISTA) 60 MG tablet TAKE 1 TABLET(60 MG) BY MOUTH DAILY   traMADol (ULTRAM) 50 MG tablet Take 100 mg by mouth every 8 (eight) hours as needed.   traZODone (DESYREL) 50 MG tablet Take 2 tablets (100 mg total) by mouth at bedtime as needed for sleep.   No facility-administered encounter medications on file as of 09/22/2021.    Amanda Benson was contacted to remind of upcoming telephone visit with Amanda Benson  on 09/25/21 at 9:30am.  Patient was reminded to have any blood glucose and blood pressure readings available for review at appointment.   Message was left reminding patient of appointment.   CCM referral has been placed prior to visit?  No    Star Rating Drugs: Medication:  Last Fill: Day Supply Glipizide $RemoveBeforeDE'10mg'[id number]$  08/26/21 [place], CPP notified  Amanda Benson, [place]  [phone]

## 2021-09-22 NOTE — Telephone Encounter (Signed)
Patient unable to be reached re: Amanda Benson, which was ordered per NP Marletta Lor. CGM is not covered by her insurance since she is not on insulin. Patient would have to pay out of pocket if she wants to try this, about $80 per sensor.

## 2021-09-25 ENCOUNTER — Other Ambulatory Visit: Payer: Medicare HMO | Admitting: Nurse Practitioner

## 2021-09-25 ENCOUNTER — Ambulatory Visit: Payer: Medicare HMO | Admitting: Pharmacist

## 2021-09-25 DIAGNOSIS — R532 Functional quadriplegia: Secondary | ICD-10-CM

## 2021-09-25 DIAGNOSIS — F32A Depression, unspecified: Secondary | ICD-10-CM

## 2021-09-25 DIAGNOSIS — E1140 Type 2 diabetes mellitus with diabetic neuropathy, unspecified: Secondary | ICD-10-CM

## 2021-09-25 DIAGNOSIS — Z8673 Personal history of transient ischemic attack (TIA), and cerebral infarction without residual deficits: Secondary | ICD-10-CM

## 2021-09-25 DIAGNOSIS — M810 Age-related osteoporosis without current pathological fracture: Secondary | ICD-10-CM

## 2021-09-25 DIAGNOSIS — E1169 Type 2 diabetes mellitus with other specified complication: Secondary | ICD-10-CM

## 2021-09-25 DIAGNOSIS — F419 Anxiety disorder, unspecified: Secondary | ICD-10-CM

## 2021-09-25 DIAGNOSIS — R5381 Other malaise: Secondary | ICD-10-CM

## 2021-09-25 DIAGNOSIS — I10 Essential (primary) hypertension: Secondary | ICD-10-CM

## 2021-09-26 DIAGNOSIS — I10 Essential (primary) hypertension: Secondary | ICD-10-CM | POA: Diagnosis not present

## 2021-09-26 DIAGNOSIS — E118 Type 2 diabetes mellitus with unspecified complications: Secondary | ICD-10-CM | POA: Diagnosis not present

## 2021-09-26 DIAGNOSIS — K529 Noninfective gastroenteritis and colitis, unspecified: Secondary | ICD-10-CM | POA: Diagnosis not present

## 2021-10-01 ENCOUNTER — Telehealth: Payer: Self-pay

## 2021-10-01 NOTE — Telephone Encounter (Signed)
1010 am.  Follow up call made to daughter Amanda Benson regarding DME.  Advised insurance does not pay for fully electric bed and cost can range from $120-150 per month.  Daughter requested order be canceled.  I provided [employer] DME number for daughter to follow up on fully electric bed.  Visit scheduled for July 24 @ 1 pm.

## 2021-10-20 ENCOUNTER — Telehealth: Payer: Self-pay

## 2021-10-20 NOTE — Chronic Care Management (AMB) (Signed)
Chronic Care Management Pharmacy Assistant   Name: Amanda Benson  MRN: [id number] DOB: 1935/10/28   Reason for Encounter: Medication Adherence and Delivery Coordination   Recent office visits:  None since last CCM contact  Recent consult visits:  09/25/21-Amanda Gusler,NP- Palliative care home visit- no medication changes,f/u 4 weeks   Hospital visits:  None in previous 6 months  Medications: Outpatient Encounter Medications as of 10/20/2021  Medication Sig   Accu-Chek Softclix Lancets lancets SMARTSIG:Topical 1-4 Times Daily   amLODipine (NORVASC) 10 MG tablet TAKE ONE TABLET BY MOUTH EVERY MORNING   blood glucose meter kit and supplies KIT Dispense based on patient and insurance preference. Use up to four times daily as directed.   buPROPion (WELLBUTRIN XL) 300 MG 24 hr tablet TAKE 1 TABLET(300 MG) BY MOUTH DAILY   FLUoxetine (PROZAC) 40 MG capsule TAKE ONE CAPSULE BY MOUTH EVERY MORNING   glipiZIDE (GLUCOTROL XL) 10 MG 24 hr tablet TAKE ONE TABLET BY MOUTH EVERY MORNING   glucose blood (COOL BLOOD GLUCOSE TEST STRIPS) test strip To check blood glucose qd and prn for DM2   hydrochlorothiazide (HYDRODIURIL) 12.5 MG tablet Take 1 tablet (12.5 mg total) by mouth every morning.   Insulin Pen Needle 29G X 12MM MISC Use with insulin daily   loratadine (CLARITIN) 10 MG tablet Take 10 mg by mouth daily.   LORazepam (ATIVAN) 0.5 MG tablet Take 0.5 mg by mouth daily as needed (agitation).   raloxifene (EVISTA) 60 MG tablet TAKE 1 TABLET(60 MG) BY MOUTH DAILY   traMADol (ULTRAM) 50 MG tablet Take 100 mg by mouth every 8 (eight) hours as needed.   traZODone (DESYREL) 50 MG tablet Take 2 tablets (100 mg total) by mouth at bedtime as needed for sleep.   No facility-administered encounter medications on file as of 10/20/2021.   BP Readings from Last 3 Encounters:  06/04/21 135/82  10/30/20 (!) 152/70  10/22/20 (!) 148/59    Lab Results  Component Value Date   HGBA1C 8.0 (H)  06/21/2020      Recent OV, Consult or Hospital visit:  Palliative Home care visit No medication changes indicated   Last adherence delivery date:09/30/21      Patient is due for next adherence delivery on: 10/30/21  Spoke with patient on 10/21/21 reviewed medications and coordinated delivery.Spoke with Daughter Amanda Benson   This delivery to include: Adherence Packaging  30 Days  Packs: Amlodipine 10mg - 1 tablet daily (1 breakfast) Glipizide 10mg  XL - 1 tablet daily (1 breakfast) Fluoxetine 40mg  - 1 capsule daily (1 breakfast) Hydrochlorothiazide 12.5 mg- 1 tablet (1 breakfast Raloxifene 60mg  -1 tablet daily (1 breakfast) Bupropion 300mg  24 hr-1 tablet daily (1 breakfast) Trazodone 50mg   take 2  tablet bedtime (2 bedtime)  VIAL medications: Womens multivitamin- chew 2 tablets every morning  Accuchek test strips Softclix lancets Lorazepam 0.5mg - take 1 tablet every 6 hours as needed Claritin 10mg - take 1 tablet daily as needed   Patient declined the following medications this month: Tramadol 50mg - supply on hand    Any concerns about your medications? No  How often do you forget or accidentally miss a dose? Never  Do you use a pillbox? No  Is patient in packaging Yes  What is the date on your next pill pack?Daughter not near package to read date  Any concerns or issues with your packaging? Family happy with the process   No refill request needed.  Confirmed delivery date of 10/30/21, advised patient that pharmacy  will contact them the morning of delivery.  Recent blood pressure readings are as follows: none available    Recent blood glucose readings are as follows: none available    Annual wellness visit in last year? Yes Most Recent BP reading:135/82   If Diabetic: Most recent A1C reading: 8.0 on 06/21/2020 Last eye exam / retinopathy screening: Up to date Last diabetic foot exam: 02/07/  Cycle dispensing form sent to Amanda Benson, PTM for  review.  Amanda Benson, CPP notified  Amanda Benson, [place]  [phone]

## 2021-10-26 ENCOUNTER — Other Ambulatory Visit: Payer: Medicare HMO

## 2021-10-26 VITALS — BP 128/76 | HR 90 | Temp 100.3°F | Resp 20

## 2021-10-26 DIAGNOSIS — Z515 Encounter for palliative care: Secondary | ICD-10-CM

## 2021-10-26 NOTE — Progress Notes (Signed)
PATIENT NAME: Amanda Benson DOB: 01/18/1936 MRN: [id number]  PRIMARY CARE PROVIDER: Judy Pimple, MD  RESPONSIBLE PARTY:  Acct ID - Guarantor Home Phone Work Phone Relationship Acct Type  [id number] DENISA, ENTERLINE* [id number]  Self P/F     [address] [place], [place], [place] [address]    Follow up visit made at the request of [name], NP.  Appetite:  Patient/daughter endorse poor appetite over the last day.  Patient had nausea/vomiting yesterday.  Took a zofran which was effective.  No further issues reported by daughter. Encouraged increase fluid intake and this has also been poor.   DM:   Blood sugar ranges from 125-210 fasting.  No insulin used.   Infection:  Per [name], there was concern for aspiration pneumonia about 1 week ago.  Antibiotics were ordered by PCP for 10 days.   Today, patient has a low grade fever.  Daughter has been taking her temp daily and no recent temps are documented.  See respiratory assessment below.  Patient reported coughing spell a couple of days ago but this has improved.  Updated Marletta Lor, NP of noted fever and daughter will notify her of if fever persists or any other changes.   Incontinence:  Patient is incontinent of bowel and bladder.  No complaints of frequency or urgency.  No issues with constipation.   HISTORY OF PRESENT ILLNESS:  Amanda Benson is a 86 y.o. year old female  with multiple medical problems including late onset CVA, HTN, DM.  Patient is followed by Palliative Care every 4-8 weeks and PRN>   CODE STATUS: DNR-Uploaded to [employer] ADVANCED DIRECTIVES: Yes MOST FORM: No PPS: 30%   PHYSICAL EXAM:   VITALS: Today's Vitals   10/26/21 1312  BP: 128/76  Pulse: 90  Resp: 20  Temp: 100.3 F (37.9 C)  SpO2: 95%  PainSc: 0-No pain    LUNGS: clear to auscultation  CARDIAC: Cor RRR}  EXTREMITIES: - for edema SKIN: Skin color, texture, turgor normal. No rashes or lesions or mobility and turgor normal  NEURO: positive for  gait problems       Truitt Merle, RN

## 2021-10-28 ENCOUNTER — Inpatient Hospital Stay
Admission: EM | Admit: 2021-10-28 | Discharge: 2021-10-31 | DRG: 872 | Disposition: A | Discharge status: Home Health | Payer: Medicare HMO | Attending: Internal Medicine | Admitting: Internal Medicine

## 2021-10-28 ENCOUNTER — Other Ambulatory Visit: Payer: Self-pay

## 2021-10-28 DIAGNOSIS — F32A Depression, unspecified: Secondary | ICD-10-CM | POA: Diagnosis present

## 2021-10-28 DIAGNOSIS — M810 Age-related osteoporosis without current pathological fracture: Secondary | ICD-10-CM | POA: Diagnosis present

## 2021-10-28 DIAGNOSIS — R112 Nausea with vomiting, unspecified: Secondary | ICD-10-CM | POA: Diagnosis not present

## 2021-10-28 DIAGNOSIS — Z794 Long term (current) use of insulin: Secondary | ICD-10-CM | POA: Diagnosis not present

## 2021-10-28 DIAGNOSIS — K5900 Constipation, unspecified: Secondary | ICD-10-CM | POA: Diagnosis present

## 2021-10-28 DIAGNOSIS — I10 Essential (primary) hypertension: Secondary | ICD-10-CM | POA: Diagnosis not present

## 2021-10-28 DIAGNOSIS — A4150 Gram-negative sepsis, unspecified: Principal | ICD-10-CM | POA: Diagnosis present

## 2021-10-28 DIAGNOSIS — F419 Anxiety disorder, unspecified: Secondary | ICD-10-CM | POA: Diagnosis present

## 2021-10-28 DIAGNOSIS — Z79899 Other long term (current) drug therapy: Secondary | ICD-10-CM | POA: Diagnosis not present

## 2021-10-28 DIAGNOSIS — N390 Urinary tract infection, site not specified: Secondary | ICD-10-CM | POA: Diagnosis not present

## 2021-10-28 DIAGNOSIS — K209 Esophagitis, unspecified without bleeding: Secondary | ICD-10-CM | POA: Diagnosis present

## 2021-10-28 DIAGNOSIS — I440 Atrioventricular block, first degree: Secondary | ICD-10-CM | POA: Diagnosis present

## 2021-10-28 DIAGNOSIS — E861 Hypovolemia: Secondary | ICD-10-CM | POA: Diagnosis present

## 2021-10-28 DIAGNOSIS — Z8249 Family history of ischemic heart disease and other diseases of the circulatory system: Secondary | ICD-10-CM

## 2021-10-28 DIAGNOSIS — N3289 Other specified disorders of bladder: Secondary | ICD-10-CM | POA: Diagnosis not present

## 2021-10-28 DIAGNOSIS — E871 Hypo-osmolality and hyponatremia: Secondary | ICD-10-CM | POA: Diagnosis present

## 2021-10-28 DIAGNOSIS — E119 Type 2 diabetes mellitus without complications: Secondary | ICD-10-CM | POA: Diagnosis not present

## 2021-10-28 DIAGNOSIS — Z66 Do not resuscitate: Secondary | ICD-10-CM | POA: Diagnosis present

## 2021-10-28 DIAGNOSIS — R000 Tachycardia, unspecified: Secondary | ICD-10-CM | POA: Diagnosis not present

## 2021-10-28 DIAGNOSIS — Z888 Allergy status to other drugs, medicaments and biological substances status: Secondary | ICD-10-CM | POA: Diagnosis not present

## 2021-10-28 DIAGNOSIS — E860 Dehydration: Secondary | ICD-10-CM | POA: Diagnosis present

## 2021-10-28 DIAGNOSIS — Z833 Family history of diabetes mellitus: Secondary | ICD-10-CM

## 2021-10-28 DIAGNOSIS — E785 Hyperlipidemia, unspecified: Secondary | ICD-10-CM | POA: Diagnosis present

## 2021-10-28 DIAGNOSIS — R6520 Severe sepsis without septic shock: Secondary | ICD-10-CM | POA: Diagnosis present

## 2021-10-28 DIAGNOSIS — R54 Age-related physical debility: Secondary | ICD-10-CM | POA: Diagnosis not present

## 2021-10-28 DIAGNOSIS — N179 Acute kidney failure, unspecified: Secondary | ICD-10-CM | POA: Diagnosis not present

## 2021-10-28 DIAGNOSIS — A419 Sepsis, unspecified organism: Secondary | ICD-10-CM | POA: Diagnosis not present

## 2021-10-28 DIAGNOSIS — R7989 Other specified abnormal findings of blood chemistry: Secondary | ICD-10-CM

## 2021-10-28 DIAGNOSIS — K5730 Diverticulosis of large intestine without perforation or abscess without bleeding: Secondary | ICD-10-CM | POA: Diagnosis not present

## 2021-10-28 DIAGNOSIS — Z7984 Long term (current) use of oral hypoglycemic drugs: Secondary | ICD-10-CM

## 2021-10-28 DIAGNOSIS — Z9049 Acquired absence of other specified parts of digestive tract: Secondary | ICD-10-CM

## 2021-10-28 DIAGNOSIS — R29898 Other symptoms and signs involving the musculoskeletal system: Secondary | ICD-10-CM | POA: Diagnosis not present

## 2021-10-28 LAB — COMPREHENSIVE METABOLIC PANEL
ALT: 23 U/L (ref 0–44)
AST: 32 U/L (ref 15–41)
Albumin: 2.1 g/dL — ABNORMAL LOW (ref 3.5–5.0)
Alkaline Phosphatase: 114 U/L (ref 38–126)
Anion gap: 10 (ref 5–15)
BUN: 48 mg/dL — ABNORMAL HIGH (ref 8–23)
CO2: 27 mmol/L (ref 22–32)
Calcium: 8.9 mg/dL (ref 8.9–10.3)
Chloride: 92 mmol/L — ABNORMAL LOW (ref 98–111)
Creatinine, Ser: 1.23 mg/dL — ABNORMAL HIGH (ref 0.44–1.00)
GFR, Estimated: 43 mL/min — ABNORMAL LOW (ref >60)
Glucose, Bld: 266 mg/dL — ABNORMAL HIGH (ref 70–99)
Potassium: 3.7 mmol/L (ref 3.5–5.1)
Sodium: 129 mmol/L — ABNORMAL LOW (ref 135–145)
Total Bilirubin: 1 mg/dL (ref 0.3–1.2)
Total Protein: 6 g/dL — ABNORMAL LOW (ref 6.5–8.1)

## 2021-10-28 LAB — CBC
HCT: 40.5 % (ref 36.0–46.0)
Hemoglobin: 13.2 g/dL (ref 12.0–15.0)
MCH: 26.5 pg (ref 26.0–34.0)
MCHC: 32.6 g/dL (ref 30.0–36.0)
MCV: 81.3 fL (ref 80.0–100.0)
Platelets: 631 10*3/uL — ABNORMAL HIGH (ref 150–400)
RBC: 4.98 MIL/uL (ref 3.87–5.11)
RDW: 13.6 % (ref 11.5–15.5)
WBC: 21.7 10*3/uL — ABNORMAL HIGH (ref 4.0–10.5)
nRBC: 0 % (ref 0.0–0.2)

## 2021-10-28 LAB — LIPASE, BLOOD: Lipase: 32 U/L (ref 11–51)

## 2021-10-28 NOTE — ED Notes (Signed)
First nurse note: Pt via [facility] from home c/o not eating or drinking x1 week, n/v and burning with urination.  Hx DBM and mobility issues.  EMS vitals 160/90 BP, 96 HR, 18 RR, 97% RA, 223 CBG.

## 2021-10-28 NOTE — ED Triage Notes (Signed)
Pt presents to ER via ems from home c/o emesis and nausea x5 days.  Pt states she has been unable to keep any food/drink since then.  Pt denies abd pain or diarrhea.  States last BM was a "a couple days ago."  Pt is A&O x4 at this time in NAD in triage.

## 2021-10-29 ENCOUNTER — Emergency Department: Payer: Medicare HMO

## 2021-10-29 DIAGNOSIS — E871 Hypo-osmolality and hyponatremia: Secondary | ICD-10-CM

## 2021-10-29 DIAGNOSIS — Z9049 Acquired absence of other specified parts of digestive tract: Secondary | ICD-10-CM | POA: Diagnosis not present

## 2021-10-29 DIAGNOSIS — Z888 Allergy status to other drugs, medicaments and biological substances status: Secondary | ICD-10-CM | POA: Diagnosis not present

## 2021-10-29 DIAGNOSIS — I10 Essential (primary) hypertension: Secondary | ICD-10-CM | POA: Diagnosis present

## 2021-10-29 DIAGNOSIS — N390 Urinary tract infection, site not specified: Secondary | ICD-10-CM

## 2021-10-29 DIAGNOSIS — R112 Nausea with vomiting, unspecified: Secondary | ICD-10-CM | POA: Diagnosis present

## 2021-10-29 DIAGNOSIS — F32A Depression, unspecified: Secondary | ICD-10-CM | POA: Diagnosis present

## 2021-10-29 DIAGNOSIS — F419 Anxiety disorder, unspecified: Secondary | ICD-10-CM | POA: Diagnosis present

## 2021-10-29 DIAGNOSIS — A419 Sepsis, unspecified organism: Secondary | ICD-10-CM | POA: Diagnosis not present

## 2021-10-29 DIAGNOSIS — Z833 Family history of diabetes mellitus: Secondary | ICD-10-CM | POA: Diagnosis not present

## 2021-10-29 DIAGNOSIS — Z794 Long term (current) use of insulin: Secondary | ICD-10-CM | POA: Diagnosis not present

## 2021-10-29 DIAGNOSIS — A4150 Gram-negative sepsis, unspecified: Secondary | ICD-10-CM | POA: Diagnosis present

## 2021-10-29 DIAGNOSIS — Z66 Do not resuscitate: Secondary | ICD-10-CM | POA: Diagnosis present

## 2021-10-29 DIAGNOSIS — E861 Hypovolemia: Secondary | ICD-10-CM | POA: Diagnosis present

## 2021-10-29 DIAGNOSIS — K5900 Constipation, unspecified: Secondary | ICD-10-CM | POA: Diagnosis present

## 2021-10-29 DIAGNOSIS — E119 Type 2 diabetes mellitus without complications: Secondary | ICD-10-CM | POA: Diagnosis present

## 2021-10-29 DIAGNOSIS — I440 Atrioventricular block, first degree: Secondary | ICD-10-CM | POA: Diagnosis present

## 2021-10-29 DIAGNOSIS — M810 Age-related osteoporosis without current pathological fracture: Secondary | ICD-10-CM | POA: Diagnosis present

## 2021-10-29 DIAGNOSIS — R6520 Severe sepsis without septic shock: Secondary | ICD-10-CM

## 2021-10-29 DIAGNOSIS — E860 Dehydration: Secondary | ICD-10-CM | POA: Diagnosis present

## 2021-10-29 DIAGNOSIS — N179 Acute kidney failure, unspecified: Secondary | ICD-10-CM | POA: Diagnosis present

## 2021-10-29 DIAGNOSIS — K209 Esophagitis, unspecified without bleeding: Secondary | ICD-10-CM

## 2021-10-29 DIAGNOSIS — Z79899 Other long term (current) drug therapy: Secondary | ICD-10-CM | POA: Diagnosis not present

## 2021-10-29 DIAGNOSIS — E785 Hyperlipidemia, unspecified: Secondary | ICD-10-CM | POA: Diagnosis present

## 2021-10-29 DIAGNOSIS — Z7984 Long term (current) use of oral hypoglycemic drugs: Secondary | ICD-10-CM | POA: Diagnosis not present

## 2021-10-29 DIAGNOSIS — Z8249 Family history of ischemic heart disease and other diseases of the circulatory system: Secondary | ICD-10-CM | POA: Diagnosis not present

## 2021-10-29 LAB — LACTIC ACID, PLASMA
Lactic Acid, Venous: 1.9 mmol/L (ref 0.5–1.9)
Lactic Acid, Venous: 2.1 mmol/L (ref 0.5–1.9)

## 2021-10-29 LAB — URINALYSIS, ROUTINE W REFLEX MICROSCOPIC
Bacteria, UA: NONE SEEN
Bilirubin Urine: NEGATIVE
Glucose, UA: >=500 mg/dL — AB
Ketones, ur: NEGATIVE mg/dL
Nitrite: NEGATIVE
Protein, ur: NEGATIVE mg/dL
Specific Gravity, Urine: 1.024 (ref 1.005–1.030)
WBC, UA: >50 WBC/hpf — ABNORMAL HIGH (ref 0–5)
pH: 6 (ref 5.0–8.0)

## 2021-10-29 LAB — CBC
HCT: 38 % (ref 36.0–46.0)
Hemoglobin: 12.4 g/dL (ref 12.0–15.0)
MCH: 26.7 pg (ref 26.0–34.0)
MCHC: 32.6 g/dL (ref 30.0–36.0)
MCV: 81.7 fL (ref 80.0–100.0)
Platelets: 561 10*3/uL — ABNORMAL HIGH (ref 150–400)
RBC: 4.65 MIL/uL (ref 3.87–5.11)
RDW: 13.6 % (ref 11.5–15.5)
WBC: 31.1 10*3/uL — ABNORMAL HIGH (ref 4.0–10.5)
nRBC: 0 % (ref 0.0–0.2)

## 2021-10-29 LAB — PROTIME-INR
INR: 1.2 (ref 0.8–1.2)
Prothrombin Time: 15.1 seconds (ref 11.4–15.2)

## 2021-10-29 LAB — GLUCOSE, CAPILLARY
Glucose-Capillary: 190 mg/dL — ABNORMAL HIGH (ref 70–99)
Glucose-Capillary: 114 mg/dL — ABNORMAL HIGH (ref 70–99)
Glucose-Capillary: 180 mg/dL — ABNORMAL HIGH (ref 70–99)
Glucose-Capillary: 94 mg/dL (ref 70–99)

## 2021-10-29 LAB — BASIC METABOLIC PANEL
Anion gap: 8 (ref 5–15)
BUN: 52 mg/dL — ABNORMAL HIGH (ref 8–23)
CO2: 29 mmol/L (ref 22–32)
Calcium: 8.7 mg/dL — ABNORMAL LOW (ref 8.9–10.3)
Chloride: 92 mmol/L — ABNORMAL LOW (ref 98–111)
Creatinine, Ser: 1.28 mg/dL — ABNORMAL HIGH (ref 0.44–1.00)
GFR, Estimated: 41 mL/min — ABNORMAL LOW (ref >60)
Glucose, Bld: 283 mg/dL — ABNORMAL HIGH (ref 70–99)
Potassium: 3.4 mmol/L — ABNORMAL LOW (ref 3.5–5.1)
Sodium: 129 mmol/L — ABNORMAL LOW (ref 135–145)

## 2021-10-29 LAB — HEMOGLOBIN A1C
Hgb A1c MFr Bld: 8 % — ABNORMAL HIGH (ref 4.8–5.6)
Mean Plasma Glucose: 182.9 mg/dL

## 2021-10-29 LAB — PROCALCITONIN: Procalcitonin: 0.14 ng/mL

## 2021-10-29 MED ORDER — LACTATED RINGERS IV BOLUS
1000.0000 mL | Freq: Once | INTRAVENOUS | Status: AC
  Administered 2021-10-29: 1000 mL via INTRAVENOUS

## 2021-10-29 MED ORDER — GLIPIZIDE ER 10 MG PO TB24
10.0000 mg | ORAL_TABLET | Freq: Every morning | ORAL | Status: DC
  Filled 2021-10-29: qty 1

## 2021-10-29 MED ORDER — LORATADINE 10 MG PO TABS
10.0000 mg | ORAL_TABLET | Freq: Every day | ORAL | Status: DC
  Administered 2021-10-29 – 2021-10-31 (×3): 10 mg via ORAL
  Filled 2021-10-29 (×3): qty 1

## 2021-10-29 MED ORDER — ACETAMINOPHEN 325 MG PO TABS
650.0000 mg | ORAL_TABLET | Freq: Four times a day (QID) | ORAL | Status: DC | PRN
  Administered 2021-10-29: 650 mg via ORAL
  Filled 2021-10-29: qty 2

## 2021-10-29 MED ORDER — SODIUM CHLORIDE 0.9 % IV SOLN
2.0000 g | Freq: Every day | INTRAVENOUS | Status: DC
  Administered 2021-10-29 – 2021-10-30 (×2): 2 g via INTRAVENOUS
  Filled 2021-10-29 (×3): qty 20

## 2021-10-29 MED ORDER — TRAMADOL HCL 50 MG PO TABS: 100.0000 mg | ORAL_TABLET | Freq: Three times a day (TID) | ORAL | Status: DC | PRN

## 2021-10-29 MED ORDER — TRAZODONE HCL 100 MG PO TABS: 100.0000 mg | ORAL_TABLET | Freq: Every evening | ORAL | Status: DC | PRN

## 2021-10-29 MED ORDER — INSULIN ASPART 100 UNIT/ML IJ SOLN
0.0000 [IU] | Freq: Three times a day (TID) | INTRAMUSCULAR | Status: DC
  Administered 2021-10-29 (×2): 2 [IU] via SUBCUTANEOUS
  Administered 2021-10-30: 3 [IU] via SUBCUTANEOUS
  Administered 2021-10-31: 2 [IU] via SUBCUTANEOUS
  Administered 2021-10-31: 1 [IU] via SUBCUTANEOUS
  Filled 2021-10-29 (×5): qty 1

## 2021-10-29 MED ORDER — MAGNESIUM HYDROXIDE 400 MG/5ML PO SUSP: 30.0000 mL | Freq: Every day | ORAL | Status: DC | PRN

## 2021-10-29 MED ORDER — LORAZEPAM 0.5 MG PO TABS: 0.5000 mg | ORAL_TABLET | Freq: Every day | ORAL | Status: DC | PRN

## 2021-10-29 MED ORDER — INSULIN ASPART 100 UNIT/ML IJ SOLN
0.0000 [IU] | Freq: Every day | INTRAMUSCULAR | Status: DC
  Administered 2021-10-30: 2 [IU] via SUBCUTANEOUS
  Filled 2021-10-29: qty 1

## 2021-10-29 MED ORDER — SODIUM CHLORIDE 0.9 % IV SOLN: 2.0000 g | INTRAVENOUS | Status: DC

## 2021-10-29 MED ORDER — ONDANSETRON HCL 4 MG/2ML IJ SOLN: 4.0000 mg | Freq: Four times a day (QID) | INTRAMUSCULAR | Status: DC | PRN

## 2021-10-29 MED ORDER — ONDANSETRON HCL 4 MG/2ML IJ SOLN
4.0000 mg | INTRAMUSCULAR | Status: AC
  Administered 2021-10-29: 4 mg via INTRAVENOUS
  Filled 2021-10-29: qty 2

## 2021-10-29 MED ORDER — ONDANSETRON HCL 4 MG PO TABS: 4.0000 mg | ORAL_TABLET | Freq: Four times a day (QID) | ORAL | Status: DC | PRN

## 2021-10-29 MED ORDER — ALUM & MAG HYDROXIDE-SIMETH 200-200-20 MG/5ML PO SUSP: 30.0000 mL | ORAL | Status: DC | PRN

## 2021-10-29 MED ORDER — POTASSIUM CHLORIDE 20 MEQ PO PACK
40.0000 meq | PACK | Freq: Once | ORAL | Status: AC
  Administered 2021-10-29: 40 meq via ORAL
  Filled 2021-10-29: qty 2

## 2021-10-29 MED ORDER — TRAZODONE HCL 50 MG PO TABS: 25.0000 mg | ORAL_TABLET | Freq: Every evening | ORAL | Status: DC | PRN

## 2021-10-29 MED ORDER — IOHEXOL 300 MG/ML  SOLN
75.0000 mL | Freq: Once | INTRAMUSCULAR | Status: AC | PRN
  Administered 2021-10-29: 75 mL via INTRAVENOUS

## 2021-10-29 MED ORDER — BUPROPION HCL ER (XL) 150 MG PO TB24: 300.0000 mg | ORAL_TABLET | Freq: Every day | ORAL | Status: DC

## 2021-10-29 MED ORDER — ACETAMINOPHEN 650 MG RE SUPP: 650.0000 mg | Freq: Four times a day (QID) | RECTAL | Status: DC | PRN

## 2021-10-29 MED ORDER — PANTOPRAZOLE SODIUM 40 MG IV SOLR
40.0000 mg | Freq: Two times a day (BID) | INTRAVENOUS | Status: DC
  Administered 2021-10-29 – 2021-10-30 (×4): 40 mg via INTRAVENOUS
  Filled 2021-10-29 (×5): qty 10

## 2021-10-29 MED ORDER — AMLODIPINE BESYLATE 10 MG PO TABS
10.0000 mg | ORAL_TABLET | Freq: Every morning | ORAL | Status: DC
  Administered 2021-10-29 – 2021-10-31 (×3): 10 mg via ORAL
  Filled 2021-10-29 (×3): qty 1

## 2021-10-29 MED ORDER — BUPROPION HCL ER (XL) 150 MG PO TB24
300.0000 mg | ORAL_TABLET | Freq: Every day | ORAL | Status: DC
  Administered 2021-10-29 – 2021-10-31 (×3): 300 mg via ORAL
  Filled 2021-10-29 (×3): qty 2

## 2021-10-29 MED ORDER — RALOXIFENE HCL 60 MG PO TABS
60.0000 mg | ORAL_TABLET | Freq: Every day | ORAL | Status: DC
  Administered 2021-10-29 – 2021-10-31 (×3): 60 mg via ORAL
  Filled 2021-10-29 (×3): qty 1

## 2021-10-29 MED ORDER — ENOXAPARIN SODIUM 30 MG/0.3ML IJ SOSY
30.0000 mg | PREFILLED_SYRINGE | INTRAMUSCULAR | Status: DC
  Administered 2021-10-29 – 2021-10-30 (×2): 30 mg via SUBCUTANEOUS
  Filled 2021-10-29 (×2): qty 0.3

## 2021-10-29 MED ORDER — POTASSIUM CHLORIDE IN NACL 20-0.9 MEQ/L-% IV SOLN
INTRAVENOUS | Status: DC
Start: 2021-10-29 — End: 2021-10-30
  Filled 2021-10-29 (×6): qty 1000

## 2021-10-29 MED ORDER — LACTATED RINGERS IV BOLUS (SEPSIS)
1000.0000 mL | Freq: Once | INTRAVENOUS | Status: AC
  Administered 2021-10-29: 1000 mL via INTRAVENOUS

## 2021-10-29 MED ORDER — FLUOXETINE HCL 20 MG PO CAPS
40.0000 mg | ORAL_CAPSULE | Freq: Every morning | ORAL | Status: DC
  Administered 2021-10-29 – 2021-10-31 (×3): 40 mg via ORAL
  Filled 2021-10-29 (×3): qty 2

## 2021-10-29 NOTE — ED Provider Notes (Signed)
[facility] Provider Note    Event Date/Time   First MD Initiated Contact with Patient 10/29/21 0004     (approximate)   History   Emesis   HPI Level 5 caveat: History is limited by acute illness and possibly by patient's memory deficit.  ENEZ MONAHAN is a 86 y.o. female who arrives by EMS from home for evaluation of vomiting and nausea for 5 days.  Reportedly she has not been able to keep anything down during that time.  She also says that her stomach has been hurting in the upper abdomen but she denies any lower abdominal pain.  She does not think she has had a bowel movement for a while.  She does not believe she has had any urinary pain or increased frequency that she has not started that either.  She denies having any chest pain or shortness of breath.  She is a vague historian and it is unclear if this is her baseline or if she is exhibiting some symptoms of delirium.     Physical Exam   Triage Vital Signs: ED Triage Vitals  Enc Vitals Group     BP 10/28/21 2030 130/71     Pulse Rate 10/28/21 2030 98     Resp 10/28/21 2030 16     Temp 10/28/21 2030 98.2 F (36.8 C)     Temp Source 10/28/21 2030 Oral     SpO2 10/28/21 2030 95 %     Weight 10/28/21 2030 64 kg (141 lb 1.5 oz)     Height 10/28/21 2030 1.575 m (5\' 2" )     Head Circumference --      Peak Flow --      Pain Score 10/28/21 2029 6     Pain Loc --      Pain Edu? --      Excl. in GC? --     Most recent vital signs: Vitals:   10/29/21 0146 10/29/21 0248  BP: (!) 143/67 (!) 161/68  Pulse: 92 84  Resp: 20 19  Temp: 97.6 F (36.4 C) 97.8 F (36.6 C)  SpO2: 100% 98%     General: Awake, nontoxic appearance.  Slightly agitated but not in distress. CV:  Good peripheral perfusion.  Normal heart sounds.  Regular rate and rhythm. Resp:  Normal effort.  Lungs are clear to auscultation bilaterally.  No accessory muscle usage or intercostal retractions. Abd:  No distention.  Mild  tenderness to palpation throughout the abdomen, slightly more in the epigastrium, no rebound and no guarding. Other:  Confused with GCS of 14, otherwise normal mood and affect.  No focal neurological deficits.   ED Results / Procedures / Treatments   Labs (all labs ordered are listed, but only abnormal results are displayed) Labs Reviewed  COMPREHENSIVE METABOLIC PANEL - Abnormal; Notable for the following components:      Result Value   Sodium 129 (*)    Chloride 92 (*)    Glucose, Bld 266 (*)    BUN 48 (*)    Creatinine, Ser 1.23 (*)    Total Protein 6.0 (*)    Albumin 2.1 (*)    GFR, Estimated 43 (*)    All other components within normal limits  CBC - Abnormal; Notable for the following components:   WBC 21.7 (*)    Platelets 631 (*)    All other components within normal limits  URINALYSIS, ROUTINE W REFLEX MICROSCOPIC - Abnormal; Notable for the following components:  Color, Urine YELLOW (*)    APPearance CLOUDY (*)    Glucose, UA >=500 (*)    Hgb urine dipstick MODERATE (*)    Leukocytes,Ua LARGE (*)    WBC, UA >50 (*)    All other components within normal limits  LACTIC ACID, PLASMA - Abnormal; Notable for the following components:   Lactic Acid, Venous 2.1 (*)    All other components within normal limits  URINE CULTURE  CULTURE, BLOOD (ROUTINE X 2)  CULTURE, BLOOD (ROUTINE X 2)  LIPASE, BLOOD  LACTIC ACID, PLASMA  PROTIME-INR  CORTISOL-AM, BLOOD  PROCALCITONIN  BASIC METABOLIC PANEL  CBC     EKG  ED ECG REPORT I, Loleta Rose, the attending physician, personally viewed and interpreted this ECG.  Date: 10/28/2021 EKG Time: 20: 32 Rate: 102 Rhythm: Mild sinus tachycardia QRS Axis: normal Intervals: Left axis deviation, QTc ST/T Wave abnormalities: Non-specific ST segment / T-wave changes, but no clear evidence of acute ischemia. Narrative Interpretation: no definitive evidence of acute ischemia; does not meet STEMI criteria.    RADIOLOGY I  viewed and interpreted the patient's CT of the abdomen and pelvis.  There is some inflammation around the stomach and esophagus.  The radiologist said that there is some esophagitis and that they recommend GI consultation, question malignancy.  No evidence of obstruction or ileus.    PROCEDURES:  Critical Care performed: Yes, see critical care procedure note(s)  .1-3 Lead EKG Interpretation  Performed by: Loleta Rose, MD Authorized by: Loleta Rose, MD     Interpretation: abnormal     ECG rate:  102   ECG rate assessment: tachycardic     Rhythm: sinus tachycardia     Ectopy: none     Conduction: normal   .1-3 Lead EKG Interpretation  Performed by: Loleta Rose, MD Authorized by: Loleta Rose, MD     Interpretation: normal     ECG rate:  85   ECG rate assessment: normal     Rhythm: sinus rhythm     Ectopy: none     Conduction: normal   .Critical Care  Performed by: Loleta Rose, MD Authorized by: Loleta Rose, MD   Critical care provider statement:    Critical care time (minutes):  30   Critical care time was exclusive of:  Separately billable procedures and treating other patients   Critical care was necessary to treat or prevent imminent or life-threatening deterioration of the following conditions:  Sepsis   Critical care was time spent personally by me on the following activities:  Development of treatment plan with patient or surrogate, evaluation of patient's response to treatment, examination of patient, obtaining history from patient or surrogate, ordering and performing treatments and interventions, ordering and review of laboratory studies, ordering and review of radiographic studies, pulse oximetry, re-evaluation of patient's condition and review of old charts    MEDICATIONS ORDERED IN ED: Medications  lactated ringers bolus 1,000 mL (has no administration in time range)  amLODipine (NORVASC) tablet 10 mg (has no administration in time range)  traMADol  (ULTRAM) tablet 100 mg (has no administration in time range)  FLUoxetine (PROZAC) capsule 40 mg (has no administration in time range)  LORazepam (ATIVAN) tablet 0.5 mg (has no administration in time range)  traZODone (DESYREL) tablet 100 mg (has no administration in time range)  glipiZIDE (GLUCOTROL XL) 24 hr tablet 10 mg (has no administration in time range)  raloxifene (EVISTA) tablet 60 mg (has no administration in time range)  loratadine (  CLARITIN) tablet 10 mg (has no administration in time range)  enoxaparin (LOVENOX) injection 30 mg (has no administration in time range)  0.9 % NaCl with KCl 20 mEq/ L  infusion (has no administration in time range)  cefTRIAXone (ROCEPHIN) 2 g in sodium chloride 0.9 % 100 mL IVPB (has no administration in time range)  acetaminophen (TYLENOL) tablet 650 mg (has no administration in time range)    Or  acetaminophen (TYLENOL) suppository 650 mg (has no administration in time range)  magnesium hydroxide (MILK OF MAGNESIA) suspension 30 mL (has no administration in time range)  ondansetron (ZOFRAN) tablet 4 mg (has no administration in time range)    Or  ondansetron (ZOFRAN) injection 4 mg (has no administration in time range)  buPROPion (WELLBUTRIN XL) 24 hr tablet 300 mg (has no administration in time range)  ondansetron (ZOFRAN) injection 4 mg (4 mg Intravenous Given 10/29/21 0020)  lactated ringers bolus 1,000 mL (0 mLs Intravenous Stopped 10/29/21 0146)  iohexol (OMNIPAQUE) 300 MG/ML solution 75 mL (75 mLs Intravenous Contrast Given 10/29/21 0025)     IMPRESSION / MDM / ASSESSMENT AND PLAN / ED COURSE  I reviewed the triage vital signs and the nursing notes.                              Differential diagnosis includes, but is not limited to, sepsis, UTI, intra-abdominal infection such as diverticulitis or appendicitis, SBO/ileus, electrolyte or metabolic abnormality, renal dysfunction.  Patient's presentation is most consistent with acute  presentation with potential threat to life or bodily function.  Vital signs are notable for some mild tachycardia initially which corrected with fluids.  Patient is hypertensive but not dangerously so.  Labs/studies ordered include CBC, lipase, urinalysis, comprehensive metabolic panel, lactic acid, blood cultures x2.  Also ordered CT of the abdomen pelvis with IV contrast.    Medications ordered include Zofran 4 mg IV, LR 2 L IV bolus for 30 mL/kg fluids.  Results are notable for leukocytosis of 21.7.  Lactic acid is 2.1.  Comprehensive metabolic panel is notable for slight AKI with creatinine 1.23 and mild hyponatremia of 129.  The overall constellation of symptoms is concerning for sepsis and I initiated code sepsis with target fluid goal of 30 mL/kg.  CT scan does not show sign of infection but she does have esophagitis and there was a question about neoplasm.  However her urinalysis is positive with large leukocytes and greater than 50 WBCs.  Urine culture is pending.  I am treating empirically with ceftriaxone 2 g IV.  I will consult the hospitalist service for admission.  The patient is on the cardiac monitor to evaluate for evidence of arrhythmia and/or significant heart rate changes.  Clinical Course as of 10/29/21 0304  Thu Oct 29, 2021  0229 Discussed case in person with Dr. Arville Care with the hospitalist service.  He will admit the patient. [CF]    Clinical Course User Index [CF] Loleta Rose, MD     FINAL CLINICAL IMPRESSION(S) / ED DIAGNOSES   Final diagnoses:  Sepsis, due to unspecified organism, unspecified whether acute organ dysfunction present [facility])  Urinary tract infection without hematuria, site unspecified  Nausea and vomiting, unspecified vomiting type  Esophagitis  Elevated lactic acid level     Rx / DC Orders   ED Discharge Orders     None        Note:  This document was prepared using  [employer] and may include unintentional  dictation errors.   Loleta Rose, MD 10/29/21 [phone]

## 2021-10-29 NOTE — Assessment & Plan Note (Addendum)
-   We will continue her Norvasc and hold off HCTZ and Cozaar given AKI.

## 2021-10-29 NOTE — Progress Notes (Signed)
PHARMACIST - PHYSICIAN COMMUNICATION  CONCERNING:  Enoxaparin (Lovenox) for DVT Prophylaxis    RECOMMENDATION: Patient was prescribed enoxaprin 40mg  q24 hours for VTE prophylaxis.   Filed Weights   10/28/21 2030  Weight: 64 kg (141 lb 1.5 oz)    Body mass index is 25.81 kg/m.  Estimated Creatinine Clearance: 29.9 mL/min (A) (by C-G formula based on SCr of 1.23 mg/dL (H)).  Patient is candidate for enoxaparin 30mg  every 24 hours based on CrCl <52ml/min or Weight <45kg  DESCRIPTION: Pharmacy has adjusted enoxaparin dose per [facility] policy.  Patient is now receiving enoxaparin 30 mg every 24 hours   31m, PharmD, [facility] 10/29/2021 2:42 AM

## 2021-10-29 NOTE — ED Notes (Signed)
Pt cleaned of wet brief at this time and repeat lactic acid level drawn via IV.  This writer unable to obtain 2nd set of BC via straight stick.

## 2021-10-29 NOTE — Assessment & Plan Note (Signed)
-   This likely hypovolemic due to lung depletion and dehydration. - We will hydrate with IV normal saline and follow sodium level

## 2021-10-29 NOTE — Progress Notes (Addendum)
Brief hospitalist update note.  This is a nonbillable note.  Please see same-day H&P for full billable details.  Briefly, this is a pleasant 86 year old female who was admitted with working diagnosis of sepsis secondary to gram-negative UTI.  Patient is on IV Rocephin and intravenous fluids.  Reports interval improvement.  Kidney function improving.  Regarding her esophagitis/esophageal wall thickening noted on imaging survey will treat sepsis for the next 24 hours and if clinically improved and able to tolerate endoscopy will engage GI, likely 7/28.  Lolita Patella MD  No charge  Called and updated patient's daughter [name] [phone]

## 2021-10-29 NOTE — Progress Notes (Signed)
[facility] [employer] [facility]) Hospital Liaison note:  This patient is currently enrolled in [facility] outpatient-based Palliative Care. Will continue to follow for disposition.  Please call with any outpatient palliative questions or concerns.  Thank you, Abran Cantor, LPN [facility] Liaison [phone]

## 2021-10-29 NOTE — Assessment & Plan Note (Signed)
-   This could be contributing to her recurrent nausea and vomiting. - We will place order on IV PPI therapy. - GI consultation may be obtained with improvement of her sepsis for consideration of EGD

## 2021-10-29 NOTE — Progress Notes (Signed)
CODE SEPSIS - PHARMACY COMMUNICATION  **Broad Spectrum Antibiotics should be administered within 1 hour of Sepsis diagnosis**  Time Code Sepsis Called/Page Received: 0226  Antibiotics Ordered: Ceftriaxone  Time of 1st antibiotic administration: 0505  If necessary, Name of Provider/Nurse Contacted: Lowella Dell., RN:  was trying to draw 2nd set of cultures prior to hanging dose, when transport arrived and moved pt to 1A.  Otelia Sergeant, PharmD, [facility] 10/29/2021 2:23 AM

## 2021-10-29 NOTE — Assessment & Plan Note (Signed)
-   We will continue with Taxol and Prozac.

## 2021-10-29 NOTE — Sepsis Progress Note (Signed)
Elink following Code Sepsis. 

## 2021-10-29 NOTE — Assessment & Plan Note (Signed)
-   We will continue her Glucotrol XL and place her on supplement coverage with NovoLog.

## 2021-10-29 NOTE — H&P (Signed)
Peter   PATIENT NAME: Amanda Benson    MR#:  [id number]  DATE OF BIRTH:  1935/12/22  DATE OF ADMISSION:  10/28/2021  PRIMARY CARE PHYSICIAN: Tower, [name], MD   Patient is coming from: Home  REQUESTING/REFERRING PHYSICIAN: Hinda Kehr, MD  CHIEF COMPLAINT:   Chief Complaint  Patient presents with   Emesis    HISTORY OF PRESENT ILLNESS:  Amanda Benson is a 86 y.o. Caucasian female with medical history significant for type 2 diabetes mellitus, hypertension, dyslipidemia, osteoporosis and depression, who presented to the emergency room with acute onset of nausea and vomiting over the last 5 days.  The patient reportedly was not able to keep anything down during that time.  She initially stated that she was having abdominal pain mainly in the epigastric area.  She has been constipated.  She admits to urinary frequency and urgency and denies any dysuria or oliguria or flank pain.  When I asked her about nausea and vomiting she denied though she admitted that to the ER physician earlier.  She denies any chest pain or dyspnea or cough or wheezing.  No headache or dizziness or blurred vision or paresthesias or focal muscle weakness..  ED Course: When she came to the ER, t vital signs were within normal and later BP was 162/69.  Labs revealed hyponatremia of 129 and  chloride 92 and glucose 266, BUN of 48 and creatinine 1.23 .  GFR was 43.  Procalcitonin 0.14 and lactic acid was 2.1 and later 1.9.  CBC shows significant Oxydose of 21.7.  UA was positive for UTI  EKG as reviewed by me : EKG showed sinus tachycardia with rate of 102 with first-degree AV block, left axis deviation and LVH with QRS widening and intraventricular conduction delay. Imaging: Abdominal pelvic CT scan revealed the following: 1. Thickening of the distal esophagus, as described above, which may represent sequelae associated with esophagitis. Further evaluation with endoscopy is recommended to exclude the  presence of an underlying neoplastic process. 2. Colonic diverticulosis. 3. Marked severity cortical thinning along the anterior aspect of the mid to lower left kidney. 4. Aortic atherosclerosis.   The patient was given 2 g of IV Rocephin, 1 L bolus of IV lactated Ringer and 4 mg of IV Zofran.  She will be admitted to a medical telemetry bed for further evaluation and management.   PAST MEDICAL HISTORY:   Past Medical History:  Diagnosis Date   Depression    Diabetes mellitus    type II   Hyperlipidemia    Hypertension    Obesity    Osteoporosis     PAST SURGICAL HISTORY:   Past Surgical History:  Procedure Laterality Date   ANKLE SURGERY     left medial malleblus fracture   CHOLECYSTECTOMY      SOCIAL HISTORY:   Social History   Tobacco Use   Smoking status: Never   Smokeless tobacco: Never  Substance Use Topics   Alcohol use: No    Alcohol/week: 0.0 standard drinks of alcohol    FAMILY HISTORY:   Family History  Problem Relation Age of Onset   Diabetes Mother    Hypertension Mother    Asthma Father    Hypertension Sister    Cancer Sister        lung   Asthma Brother    COPD Brother    Cancer Maternal Grandmother        breast    Asthma  Brother    Diabetes Mellitus II Daughter    COPD Daughter     DRUG ALLERGIES:   Allergies  Allergen Reactions   Alendronate Sodium     REACTION: heartburn    REVIEW OF SYSTEMS:   ROS As per history of present illness. All pertinent systems were reviewed above. Constitutional, HEENT, cardiovascular, respiratory, GI, GU, musculoskeletal, neuro, psychiatric, endocrine, integumentary and hematologic systems were reviewed and are otherwise negative/unremarkable except for positive findings mentioned above in the HPI.   MEDICATIONS AT HOME:   Prior to Admission medications   Medication Sig Start Date End Date Taking? Authorizing Provider  Accu-Chek Softclix Lancets lancets SMARTSIG:Topical 1-4 Times Daily  08/21/21   [provider]  amLODipine (NORVASC) 10 MG tablet TAKE ONE TABLET BY MOUTH EVERY MORNING 06/17/21   Tower, [name], MD  blood glucose meter kit and supplies KIT Dispense based on patient and insurance preference. Use up to four times daily as directed. 08/20/21   Tower, [name], MD  buPROPion (WELLBUTRIN XL) 300 MG 24 hr tablet TAKE 1 TABLET(300 MG) BY MOUTH DAILY 07/30/21   Tower, [name], MD  FLUoxetine (PROZAC) 40 MG capsule TAKE ONE CAPSULE BY MOUTH EVERY MORNING 06/17/21   Tower, Roque Lias A, MD  glipiZIDE (GLUCOTROL XL) 10 MG 24 hr tablet TAKE ONE TABLET BY MOUTH EVERY MORNING 06/17/21   Tower, Roque Lias A, MD  glucose blood (COOL BLOOD GLUCOSE TEST STRIPS) test strip To check blood glucose qd and prn for DM2 08/20/21   Tower, [name], MD  hydrochlorothiazide (HYDRODIURIL) 12.5 MG tablet Take 1 tablet (12.5 mg total) by mouth every morning. 07/30/21   Tower, [name], MD  Insulin Pen Needle 29G X 12MM MISC Use with insulin daily 01/22/20   Tower, [name], MD  loratadine (CLARITIN) 10 MG tablet Take 10 mg by mouth daily. 07/28/21   [provider]  LORazepam (ATIVAN) 0.5 MG tablet Take 0.5 mg by mouth daily as needed (agitation).    [provider]  raloxifene (EVISTA) 60 MG tablet TAKE 1 TABLET(60 MG) BY MOUTH DAILY 07/30/21   Tower, [name], MD  traMADol (ULTRAM) 50 MG tablet Take 100 mg by mouth every 8 (eight) hours as needed. 06/26/21   [provider]  traZODone (DESYREL) 50 MG tablet Take 2 tablets (100 mg total) by mouth at bedtime as needed for sleep. 07/30/21   Tower, [name], MD      VITAL SIGNS:  Blood pressure (!) 148/67, pulse 88, temperature 98.3 F (36.8 C), resp. rate 20, height _0  (1.575 m), weight 64 kg, SpO2 100 %.  PHYSICAL EXAMINATION:  Physical Exam  GENERAL:  86 y.o.-year-old Caucasian female patient lying in the bed with no acute distress.  EYES: Pupils equal, round, reactive to light and accommodation. No scleral icterus.  Extraocular muscles intact.  HEENT: Head atraumatic, normocephalic. Oropharynx and nasopharynx clear.  NECK:  Supple, no jugular venous distention. No thyroid enlargement, no tenderness.  LUNGS: Normal breath sounds bilaterally, no wheezing, rales,rhonchi or crepitation. No use of accessory muscles of respiration.  CARDIOVASCULAR: Regular rate and rhythm, S1, S2 normal. No murmurs, rubs, or gallops.  ABDOMEN: Soft, nondistended, nontender. Bowel sounds present. No organomegaly or mass.  EXTREMITIES: No pedal edema, cyanosis, or clubbing.  NEUROLOGIC: Cranial nerves II through XII are intact. Muscle strength 5/5 in all extremities. Sensation intact. Gait not checked.  PSYCHIATRIC: The patient is alert and oriented x 3.  Normal affect and good eye contact. SKIN:  No obvious rash, lesion, or ulcer.   LABORATORY PANEL:   CBC Recent Labs  Lab 10/29/21 0337  WBC 31.1*  HGB 12.4  HCT 38.0  PLT 561*   ------------------------------------------------------------------------------------------------------------------  Chemistries  Recent Labs  Lab 10/28/21 2136 10/29/21 0337  NA 129* 129*  K 3.7 3.4*  CL 92* 92*  CO2 27 29  GLUCOSE 266* 283*  BUN 48* 52*  CREATININE 1.23* 1.28*  CALCIUM 8.9 8.7*  AST 32  --   ALT 23  --   ALKPHOS 114  --   BILITOT 1.0  --    ------------------------------------------------------------------------------------------------------------------  Cardiac Enzymes No results for input(s): "TROPONINI" in the last 168 hours. ------------------------------------------------------------------------------------------------------------------  RADIOLOGY:  CT ABDOMEN PELVIS W CONTRAST  Result Date: 10/29/2021 CLINICAL DATA:  Nausea and vomiting x5 days. EXAM: CT ABDOMEN AND PELVIS WITH CONTRAST TECHNIQUE: Multidetector CT imaging of the abdomen and pelvis was performed using the standard protocol following bolus administration of intravenous contrast. RADIATION  DOSE REDUCTION: This exam was performed according to the departmental dose-optimization program which includes automated exposure control, adjustment of the mA and/or kV according to patient size and/or use of iterative reconstruction technique. CONTRAST:  27m OMNIPAQUE IOHEXOL 300 MG/ML  SOLN COMPARISON:  None Available. FINDINGS: Lower chest: Mild linear atelectasis is seen within the left lung base. Hepatobiliary: No focal liver abnormality is seen. Status post cholecystectomy. The common bile duct measures 1.1 cm in diameter. Pancreas: Unremarkable. No pancreatic ductal dilatation or surrounding inflammatory changes. Spleen: Normal in size without focal abnormality. Adrenals/Urinary Tract: Adrenal glands are unremarkable. Kidneys are normal in size, without renal calculi or hydronephrosis. Marked severity cortical thinning is seen along the anterior aspect of the mid to lower left kidney. The urinary bladder is poorly distended and subsequently limited in evaluation. Anterior urinary bladder wall thickening is seen. Stomach/Bowel: There is marked severity thickening of the visualized portion of the distal esophagus which leads into a moderate sized hiatal hernia. A small amount of adjacent free fluid is noted. Appendix appears normal. No evidence of bowel wall thickening, distention, or inflammatory changes. Noninflamed diverticula are seen throughout the descending and sigmoid colon. Vascular/Lymphatic: Aortic atherosclerosis. No enlarged abdominal or pelvic lymph nodes. Reproductive: Uterus and bilateral adnexa are unremarkable. Other: No abdominal wall hernia or abnormality. No abdominopelvic ascites. Musculoskeletal: Degenerative changes are seen throughout the lumbar spine. IMPRESSION: 1. Thickening of the distal esophagus, as described above, which may represent sequelae associated with esophagitis. Further evaluation with endoscopy is recommended to exclude the presence of an underlying neoplastic  process. 2. Colonic diverticulosis. 3. Marked severity cortical thinning along the anterior aspect of the mid to lower left kidney. 4. Aortic atherosclerosis. Aortic Atherosclerosis (ICD10-I70.0). Electronically Signed   By: TVirgina NorfolkM.D.   On: 10/29/2021 00:46      IMPRESSION AND PLAN:  Assessment and Plan: * Sepsis due to gram-negative UTI ([facility] - The patient be admitted to a medical telemetry bed. - Sepsis is manifested by significant leukocytosis and heart rate of 107. - The patient meets severe sepsis criteria given elevated lactic acid of 2.1 and mild AKI. - We will continue antibiotic therapy with IV Rocephin. - We will continue hydration with IV normal saline. - We will follow blood and urine cultures.  AKI (acute kidney injury) ([facility] - This likely prerenal due to hypovolemia and volume depletion from recurrent nausea and vomiting with subsequent dehydration. - She will be hydrated with IV normal saline and will follow BMPs. - We will avoid  nephrotoxins.  Acute esophagitis - This could be contributing to her recurrent nausea and vomiting. - We will place order on IV PPI therapy. - GI consultation may be obtained with improvement of her sepsis for consideration of EGD  Hyponatremia - This likely hypovolemic due to lung depletion and dehydration. - We will hydrate with IV normal saline and follow sodium level  Type 2 diabetes mellitus without complications ([place]) - We will continue her Glucotrol XL and place her on supplement coverage with NovoLog.  Anxiety and depression - We will continue with Taxol and Prozac.  Essential hypertension - We will continue her Norvasc and hold off HCTZ and Cozaar given AKI.    DVT prophylaxis: Lovenox.  Advanced Care Planning:  Code Status: The patient is DNR/DNI.  This was discussed with her. Family Communication:  The plan of care was discussed in details with the patient (and family). I answered all questions. The patient  agreed to proceed with the above mentioned plan. Further management will depend upon hospital course. Disposition Plan: Back to previous home environment Consults called: none.  She may need gastroenterology consult at some point to assess for esophagitis. All the records are reviewed and case discussed with ED provider.  Status is: Inpatient    At the time of the admission, it appears that the appropriate admission status for this patient is inpatient.  This is judged to be reasonable and necessary in order to provide the required intensity of service to ensure the patient's safety given the presenting symptoms, physical exam findings and initial radiographic and laboratory data in the context of comorbid conditions.  The patient requires inpatient status due to high intensity of service, high risk of further deterioration and high frequency of surveillance required.  I certify that at the time of admission, it is my clinical judgment that the patient will require inpatient hospital care extending more than 2 midnights.                            Dispo: The patient is from: Home              Anticipated d/c is to: Home              Patient currently is not medically stable to d/c.              Difficult to place patient: No  Christel Mormon M.D on 10/29/2021 at 5:32 AM  [facility]   From 7 PM-7 AM, contact night-coverage [email or web address]  CC: Primary care physician; Tower, [name], MD

## 2021-10-29 NOTE — Assessment & Plan Note (Signed)
-   The patient be admitted to a medical telemetry bed. - Sepsis is manifested by significant leukocytosis and heart rate of 107. - The patient meets severe sepsis criteria given elevated lactic acid of 2.1 and mild AKI. - We will continue antibiotic therapy with IV Rocephin. - We will continue hydration with IV normal saline. - We will follow blood and urine cultures.

## 2021-10-29 NOTE — Progress Notes (Signed)
SLP Cancellation Note  Patient Details Name: Amanda Benson MRN: [id number] DOB: 1935-09-03   Cancelled treatment:       Reason Eval/Treat Not Completed: Medical issues which prohibited therapy  Will defer swallow evaluation until resolution of esophagitis and GI consult.   [name] B. Dreama Saa, M.S., CCC-SLP, CBIS Speech-Language Pathologist Certified Brain Injury Specialist [place] [phone] Ascom [phone] Fax [phone]  [name] 10/29/2021, 9:52 AM

## 2021-10-29 NOTE — Assessment & Plan Note (Signed)
-   This likely prerenal due to hypovolemia and volume depletion from recurrent nausea and vomiting with subsequent dehydration. - She will be hydrated with IV normal saline and will follow BMPs. - We will avoid nephrotoxins.

## 2021-10-30 DIAGNOSIS — A4150 Gram-negative sepsis, unspecified: Secondary | ICD-10-CM | POA: Diagnosis not present

## 2021-10-30 DIAGNOSIS — N390 Urinary tract infection, site not specified: Secondary | ICD-10-CM | POA: Diagnosis not present

## 2021-10-30 LAB — CBC WITH DIFFERENTIAL/PLATELET
Abs Immature Granulocytes: 0.15 10*3/uL — ABNORMAL HIGH (ref 0.00–0.07)
Basophils Absolute: 0.1 10*3/uL (ref 0.0–0.1)
Basophils Relative: 0 %
Eosinophils Absolute: 0 10*3/uL (ref 0.0–0.5)
Eosinophils Relative: 0 %
HCT: 35.7 % — ABNORMAL LOW (ref 36.0–46.0)
Hemoglobin: 11.6 g/dL — ABNORMAL LOW (ref 12.0–15.0)
Immature Granulocytes: 1 %
Lymphocytes Relative: 7 %
Lymphs Abs: 1.6 10*3/uL (ref 0.7–4.0)
MCH: 27.6 pg (ref 26.0–34.0)
MCHC: 32.5 g/dL (ref 30.0–36.0)
MCV: 85 fL (ref 80.0–100.0)
Monocytes Absolute: 1.2 10*3/uL — ABNORMAL HIGH (ref 0.1–1.0)
Monocytes Relative: 6 %
Neutro Abs: 18.8 10*3/uL — ABNORMAL HIGH (ref 1.7–7.7)
Neutrophils Relative %: 86 %
Platelets: 506 10*3/uL — ABNORMAL HIGH (ref 150–400)
RBC: 4.2 MIL/uL (ref 3.87–5.11)
RDW: 14.2 % (ref 11.5–15.5)
WBC: 21.8 10*3/uL — ABNORMAL HIGH (ref 4.0–10.5)
nRBC: 0 % (ref 0.0–0.2)

## 2021-10-30 LAB — BASIC METABOLIC PANEL
Anion gap: 6 (ref 5–15)
BUN: 40 mg/dL — ABNORMAL HIGH (ref 8–23)
CO2: 26 mmol/L (ref 22–32)
Calcium: 8.4 mg/dL — ABNORMAL LOW (ref 8.9–10.3)
Chloride: 104 mmol/L (ref 98–111)
Creatinine, Ser: 1.08 mg/dL — ABNORMAL HIGH (ref 0.44–1.00)
GFR, Estimated: 51 mL/min — ABNORMAL LOW (ref >60)
Glucose, Bld: 89 mg/dL (ref 70–99)
Potassium: 3.8 mmol/L (ref 3.5–5.1)
Sodium: 136 mmol/L (ref 135–145)

## 2021-10-30 LAB — GLUCOSE, CAPILLARY
Glucose-Capillary: 83 mg/dL (ref 70–99)
Glucose-Capillary: 203 mg/dL — ABNORMAL HIGH (ref 70–99)
Glucose-Capillary: 111 mg/dL — ABNORMAL HIGH (ref 70–99)
Glucose-Capillary: 229 mg/dL — ABNORMAL HIGH (ref 70–99)

## 2021-10-30 MED ORDER — SODIUM CHLORIDE 0.9 % IV SOLN: INTRAVENOUS | Status: DC

## 2021-10-30 MED ORDER — ENOXAPARIN SODIUM 40 MG/0.4ML IJ SOSY
40.0000 mg | PREFILLED_SYRINGE | INTRAMUSCULAR | Status: DC
  Administered 2021-10-31: 40 mg via SUBCUTANEOUS
  Filled 2021-10-30: qty 0.4

## 2021-10-30 NOTE — Plan of Care (Signed)
  Problem: Clinical Measurements: Goal: Diagnostic test results will improve Outcome: Progressing Goal: Signs and symptoms of infection will decrease Outcome: Progressing   Problem: Education: Goal: Knowledge of General Education information will improve Description: Including pain rating scale, medication(s)/side effects and non-pharmacologic comfort measures Outcome: Progressing   Problem: Health Behavior/Discharge Planning: Goal: Ability to manage health-related needs will improve Outcome: Progressing   Problem: Activity: Goal: Risk for activity intolerance will decrease Outcome: Progressing   Problem: Nutrition: Goal: Adequate nutrition will be maintained Outcome: Progressing

## 2021-10-30 NOTE — Plan of Care (Signed)
  Problem: Fluid Volume: Goal: Hemodynamic stability will improve Outcome: Progressing   Problem: Clinical Measurements: Goal: Diagnostic test results will improve Outcome: Progressing Goal: Signs and symptoms of infection will decrease Outcome: Progressing   Problem: Education: Goal: Knowledge of General Education information will improve Description: Including pain rating scale, medication(s)/side effects and non-pharmacologic comfort measures Outcome: Progressing   Problem: Health Behavior/Discharge Planning: Goal: Ability to manage health-related needs will improve Outcome: Progressing   Problem: Clinical Measurements: Goal: Ability to maintain clinical measurements within normal limits will improve Outcome: Progressing Goal: Will remain free from infection Outcome: Progressing Goal: Diagnostic test results will improve Outcome: Progressing Goal: Respiratory complications will improve Outcome: Progressing Goal: Cardiovascular complication will be avoided Outcome: Progressing   Problem: Activity: Goal: Risk for activity intolerance will decrease Outcome: Progressing   Problem: Nutrition: Goal: Adequate nutrition will be maintained Outcome: Progressing   Problem: Coping: Goal: Level of anxiety will decrease Outcome: Progressing   Problem: Elimination: Goal: Will not experience complications related to bowel motility Outcome: Progressing Goal: Will not experience complications related to urinary retention Outcome: Progressing   Problem: Pain Managment: Goal: General experience of comfort will improve Outcome: Progressing   Problem: Safety: Goal: Ability to remain free from injury will improve Outcome: Progressing   Problem: Skin Integrity: Goal: Risk for impaired skin integrity will decrease Outcome: Progressing   

## 2021-10-30 NOTE — Progress Notes (Signed)
PHARMACIST - PHYSICIAN COMMUNICATION  CONCERNING:  Enoxaparin (Lovenox) for DVT Prophylaxis    RECOMMENDATION: Patient was prescribed enoxaprin 30mg  q24 hours for VTE prophylaxis.   Filed Weights   10/28/21 2030  Weight: 64 kg (141 lb 1.5 oz)    Body mass index is 25.81 kg/m.  Estimated Creatinine Clearance: 34.1 mL/min (A) (by C-G formula based on SCr of 1.08 mg/dL (H)).  Renal function improved. CrCl > 30  DESCRIPTION: Pharmacy has adjusted enoxaparin dose per [facility] policy.  Patient is now receiving enoxaparin 40 mg every 24 hours    [facility], PharmD Clinical Pharmacist  10/30/2021 2:30 PM

## 2021-10-30 NOTE — Progress Notes (Signed)
PROGRESS NOTE    Amanda Benson  QPY:[id number] DOB: [birth year] DOA: 10/28/2021 PCP: Judy Pimple, MD    Brief Narrative:  86 year old female who was admitted with working diagnosis of sepsis secondary to gram-negative UTI.  Patient is on IV Rocephin and intravenous fluids.  Reports interval improvement.  Kidney function improving.    Patient reluctant for endoscopy at this time.  Is tolerating p.o. intake without nausea or vomiting.  No swallowing issues.  Defer GI evaluation for now and complete treatment for sepsis/urinary tract infection.   Assessment & Plan:   Principal Problem:   Sepsis due to gram-negative UTI (HCC) Active Problems:   AKI (acute kidney injury) (HCC)   Hyponatremia   Acute esophagitis   Essential hypertension   Anxiety and depression   Type 2 diabetes mellitus without complications (HCC)  Gram-negative UTI Severe sepsis secondary to above Patient clinically improving over interval.  Urine culture with GNR's, speciation pending.  Sepsis physiology improving.  Leukocytosis resolved, heart rate improved. Plan: Continue IV Rocephin IV fluid hydration Follow-up blood and urine cultures  Acute kidney injury, improved Likely prerenal due to hypovolemia from recurrent nausea and vomiting.  Unable to exclude ATN Improving on IV fluids Continue IVF Avoid nephrotoxins  Esophageal thickening/acute esophagitis Appears improved.  Patient tolerating p.o. intake without any nausea or vomiting.  Continue PPI.  Offered GI consultation however patient reluctant at this time.  As patient is tolerating p.o. intake will defer for now.  Hyponatremia Likely hypovolemic Improved on IV fluids  Type 2 diabetes mellitus without complication DC Glucotrol for now SSI, Accu-Cheks before meals and at bedtime Carb modified diet  Anxiety depression Continue lorazepam and Prozac per home dose  Essential hypertension Continue Norvasc Continue holding hydrochlorothiazide  and Cozaar     DVT prophylaxis: SQ Lovenox Code Status: DNR Family Communication: Daughter via phone 7/27 Disposition Plan: Status is: Inpatient Remains inpatient appropriate because: \Severe sepsis secondary to UTI.  Clinically improving over interval.  Possible disposition in 24 to 48 hours.   Level of care: Telemetry Medical  Consultants:  None  Procedures:  None  Antimicrobials: Ceftriaxone   Subjective: Seen and examined.  Resting comfortably in bed.  Tolerating p.o. intake.  No nausea or vomiting  Objective: Vitals:   10/29/21 2003 10/30/21 0036 10/30/21 0425 10/30/21 0800  BP: (!) 114/59 136/67 133/63 132/67  Pulse: 91 88 87 95  Resp: 15 15 17 16   Temp: 98.9 F (37.2 C) 97.8 F (36.6 C) 98.1 F (36.7 C) 97.6 F (36.4 C)  TempSrc:      SpO2: 98% 100% 100% 100%  Weight:      Height:        Intake/Output Summary (Last 24 hours) at 10/30/2021 1136 Last data filed at 10/30/2021 0547 Gross per 24 hour  Intake 2063.52 ml  Output 300 ml  Net 1763.52 ml   Filed Weights   10/28/21 2030  Weight: 64 kg    Examination:  General exam: Appears calm and comfortable  Respiratory system: Clear to auscultation. Respiratory effort normal. Cardiovascular system: S1-S2, RRR, no murmurs, no pedal edema Gastrointestinal system: Soft, NT/ND, normal bowel sounds Central nervous system: Alert and oriented. No focal neurological deficits. Extremities: Symmetric 5 x 5 power. Skin: No rashes, lesions or ulcers Psychiatry: Judgement and insight appear normal. Mood & affect appropriate.     Data Reviewed: I have personally reviewed following labs and imaging studies  CBC: Recent Labs  Lab 10/28/21 2136 10/29/21 10/31/21 10/30/21 11/01/21  WBC 21.7* 31.1* 21.8*  NEUTROABS  --   --  18.8*  HGB 13.2 12.4 11.6*  HCT 40.5 38.0 35.7*  MCV 81.3 81.7 85.0  PLT 631* 561* 506*   Basic Metabolic Panel: Recent Labs  Lab 10/28/21 2136 10/29/21 0337 10/30/21 0742  NA 129*  129* 136  K 3.7 3.4* 3.8  CL 92* 92* 104  CO2 27 29 26   GLUCOSE 266* 283* 89  BUN 48* 52* 40*  CREATININE 1.23* 1.28* 1.08*  CALCIUM 8.9 8.7* 8.4*   GFR: Estimated Creatinine Clearance: 34.1 mL/min (A) (by C-G formula based on SCr of 1.08 mg/dL (H)). Liver Function Tests: Recent Labs  Lab 10/28/21 2136  AST 32  ALT 23  ALKPHOS 114  BILITOT 1.0  PROT 6.0*  ALBUMIN 2.1*   Recent Labs  Lab 10/28/21 2136  LIPASE 32   No results for input(s): "AMMONIA" in the last 168 hours. Coagulation Profile: Recent Labs  Lab 10/29/21 0337  INR 1.2   Cardiac Enzymes: No results for input(s): "CKTOTAL", "CKMB", "CKMBINDEX", "TROPONINI" in the last 168 hours. BNP (last 3 results) No results for input(s): "PROBNP" in the last 8760 hours. HbA1C: Recent Labs    10/29/21 0629  HGBA1C 8.0*   CBG: Recent Labs  Lab 10/29/21 0900 10/29/21 1220 10/29/21 1646 10/29/21 2024 10/30/21 0742  GLUCAP 190* 114* 180* 94 83   Lipid Profile: No results for input(s): "CHOL", "HDL", "LDLCALC", "TRIG", "CHOLHDL", "LDLDIRECT" in the last 72 hours. Thyroid Function Tests: No results for input(s): "TSH", "T4TOTAL", "FREET4", "T3FREE", "THYROIDAB" in the last 72 hours. Anemia Panel: No results for input(s): "VITAMINB12", "FOLATE", "FERRITIN", "TIBC", "IRON", "RETICCTPCT" in the last 72 hours. Sepsis Labs: Recent Labs  Lab 10/29/21 0021 10/29/21 0305 10/29/21 0337  PROCALCITON  --   --  0.14  LATICACIDVEN 2.1* 1.9  --     Recent Results (from the past 240 hour(s))  Blood culture (routine x 2)     Status: None (Preliminary result)   Collection Time: 10/29/21 12:21 AM   Specimen: BLOOD  Result Value Ref Range Status   Specimen Description BLOOD LEFT [facility]  Final   Special Requests   Final    BOTTLES DRAWN AEROBIC AND ANAEROBIC Blood Culture adequate volume   Culture   Final    NO GROWTH 1 DAY Performed at [facility], [address] [place], [place]    Report Status  PENDING  Incomplete  Urine Culture     Status: Abnormal (Preliminary result)   Collection Time: 10/29/21 12:22 AM   Specimen: In/Out Cath Urine  Result Value Ref Range Status   Specimen Description   Final    IN/OUT CATH URINE Performed at [facility], [address], [place], [place]    Special Requests   Final    NONE Performed at [facility], [address], [place], [place]    Culture 100 COLONIES/mL GRAM NEGATIVE RODS (A)  Final   Report Status PENDING  Incomplete  Blood culture (routine x 2)     Status: None (Preliminary result)   Collection Time: 10/29/21  3:37 AM   Specimen: BLOOD  Result Value Ref Range Status   Specimen Description BLOOD LEFT FA  Final   Special Requests   Final    BOTTLES DRAWN AEROBIC ONLY Blood Culture adequate volume   Culture   Final    NO GROWTH 1 DAY Performed at [facility], [address] [place], [place]  [address]    Report Status PENDING  Incomplete         Radiology Studies: CT ABDOMEN PELVIS W CONTRAST  Result Date: 10/29/2021 CLINICAL DATA:  Nausea and vomiting x5 days. EXAM: CT ABDOMEN AND PELVIS WITH CONTRAST TECHNIQUE: Multidetector CT imaging of the abdomen and pelvis was performed using the standard protocol following bolus administration of intravenous contrast. RADIATION DOSE REDUCTION: This exam was performed according to the departmental dose-optimization program which includes automated exposure control, adjustment of the mA and/or kV according to patient size and/or use of iterative reconstruction technique. CONTRAST:  64mL OMNIPAQUE IOHEXOL 300 MG/ML  SOLN COMPARISON:  None Available. FINDINGS: Lower chest: Mild linear atelectasis is seen within the left lung base. Hepatobiliary: No focal liver abnormality is seen. Status post cholecystectomy. The common bile duct measures 1.1 cm in diameter. Pancreas: Unremarkable. No pancreatic ductal dilatation or surrounding inflammatory  changes. Spleen: Normal in size without focal abnormality. Adrenals/Urinary Tract: Adrenal glands are unremarkable. Kidneys are normal in size, without renal calculi or hydronephrosis. Marked severity cortical thinning is seen along the anterior aspect of the mid to lower left kidney. The urinary bladder is poorly distended and subsequently limited in evaluation. Anterior urinary bladder wall thickening is seen. Stomach/Bowel: There is marked severity thickening of the visualized portion of the distal esophagus which leads into a moderate sized hiatal hernia. A small amount of adjacent free fluid is noted. Appendix appears normal. No evidence of bowel wall thickening, distention, or inflammatory changes. Noninflamed diverticula are seen throughout the descending and sigmoid colon. Vascular/Lymphatic: Aortic atherosclerosis. No enlarged abdominal or pelvic lymph nodes. Reproductive: Uterus and bilateral adnexa are unremarkable. Other: No abdominal wall hernia or abnormality. No abdominopelvic ascites. Musculoskeletal: Degenerative changes are seen throughout the lumbar spine. IMPRESSION: 1. Thickening of the distal esophagus, as described above, which may represent sequelae associated with esophagitis. Further evaluation with endoscopy is recommended to exclude the presence of an underlying neoplastic process. 2. Colonic diverticulosis. 3. Marked severity cortical thinning along the anterior aspect of the mid to lower left kidney. 4. Aortic atherosclerosis. Aortic Atherosclerosis (ICD10-I70.0). Electronically Signed   By: Aram Candela M.D.   On: 10/29/2021 00:46        Scheduled Meds:  amLODipine  10 mg Oral q morning   buPROPion  300 mg Oral Daily   enoxaparin (LOVENOX) injection  30 mg Subcutaneous Q24H   FLUoxetine  40 mg Oral q morning   insulin aspart  0-5 Units Subcutaneous QHS   insulin aspart  0-9 Units Subcutaneous TID WC   loratadine  10 mg Oral Daily   pantoprazole (PROTONIX) IV  40 mg  Intravenous Q12H   raloxifene  60 mg Oral Daily   Continuous Infusions:  sodium chloride     cefTRIAXone (ROCEPHIN)  IV 2 g (10/30/21 0526)     LOS: 1 day     Tresa Moore, MD [facility]   If 7PM-7AM, please contact night-coverage  10/30/2021, 11:36 AM

## 2021-10-31 DIAGNOSIS — N390 Urinary tract infection, site not specified: Secondary | ICD-10-CM | POA: Diagnosis not present

## 2021-10-31 DIAGNOSIS — A4150 Gram-negative sepsis, unspecified: Secondary | ICD-10-CM | POA: Diagnosis not present

## 2021-10-31 LAB — CBC WITH DIFFERENTIAL/PLATELET
Abs Immature Granulocytes: 0.08 10*3/uL — ABNORMAL HIGH (ref 0.00–0.07)
Basophils Absolute: 0 10*3/uL (ref 0.0–0.1)
Basophils Relative: 0 %
Eosinophils Absolute: 0 10*3/uL (ref 0.0–0.5)
Eosinophils Relative: 0 %
HCT: 38.2 % (ref 36.0–46.0)
Hemoglobin: 12.2 g/dL (ref 12.0–15.0)
Immature Granulocytes: 1 %
Lymphocytes Relative: 11 %
Lymphs Abs: 1.5 10*3/uL (ref 0.7–4.0)
MCH: 27.1 pg (ref 26.0–34.0)
MCHC: 31.9 g/dL (ref 30.0–36.0)
MCV: 84.7 fL (ref 80.0–100.0)
Monocytes Absolute: 0.5 10*3/uL (ref 0.1–1.0)
Monocytes Relative: 4 %
Neutro Abs: 11.3 10*3/uL — ABNORMAL HIGH (ref 1.7–7.7)
Neutrophils Relative %: 84 %
Platelets: 524 10*3/uL — ABNORMAL HIGH (ref 150–400)
RBC: 4.51 MIL/uL (ref 3.87–5.11)
RDW: 14.3 % (ref 11.5–15.5)
WBC: 13.5 10*3/uL — ABNORMAL HIGH (ref 4.0–10.5)
nRBC: 0 % (ref 0.0–0.2)

## 2021-10-31 LAB — BASIC METABOLIC PANEL
Anion gap: 9 (ref 5–15)
BUN: 32 mg/dL — ABNORMAL HIGH (ref 8–23)
CO2: 20 mmol/L — ABNORMAL LOW (ref 22–32)
Calcium: 8.3 mg/dL — ABNORMAL LOW (ref 8.9–10.3)
Chloride: 105 mmol/L (ref 98–111)
Creatinine, Ser: 1.02 mg/dL — ABNORMAL HIGH (ref 0.44–1.00)
GFR, Estimated: 54 mL/min — ABNORMAL LOW (ref >60)
Glucose, Bld: 103 mg/dL — ABNORMAL HIGH (ref 70–99)
Potassium: 3.2 mmol/L — ABNORMAL LOW (ref 3.5–5.1)
Sodium: 134 mmol/L — ABNORMAL LOW (ref 135–145)

## 2021-10-31 LAB — GLUCOSE, CAPILLARY
Glucose-Capillary: 79 mg/dL (ref 70–99)
Glucose-Capillary: 164 mg/dL — ABNORMAL HIGH (ref 70–99)
Glucose-Capillary: 149 mg/dL — ABNORMAL HIGH (ref 70–99)

## 2021-10-31 LAB — URINE CULTURE: Culture: 100 — AB

## 2021-10-31 MED ORDER — PANTOPRAZOLE SODIUM 40 MG PO TBEC
40.0000 mg | DELAYED_RELEASE_TABLET | Freq: Two times a day (BID) | ORAL | Status: DC
  Administered 2021-10-31: 40 mg via ORAL
  Filled 2021-10-31: qty 1

## 2021-10-31 MED ORDER — PANTOPRAZOLE SODIUM 40 MG PO TBEC: 40.0000 mg | DELAYED_RELEASE_TABLET | Freq: Every day | ORAL | 1 refills | Status: DC

## 2021-10-31 MED ORDER — ONDANSETRON HCL 4 MG PO TABS: 4.0000 mg | ORAL_TABLET | Freq: Four times a day (QID) | ORAL | 0 refills | Status: AC | PRN

## 2021-10-31 NOTE — Evaluation (Signed)
Occupational Therapy Evaluation Patient Details Name: Amanda Benson MRN: [id number] DOB: 18-Jul-1935 Today's Date: 10/31/2021   History of Present Illness pt is an 86 year old female presenting to the ED with acute onset of nausea and vomiting over 5 days. Admitted with sepsis due to UTI. PMH significant for 2 diabetes mellitus, hypertension, dyslipidemia, osteoporosis and depression   Clinical Impression   Chart reviewed, RN cleared pt for participation in OT evaluation. Per pt and daughter on phone, pt requires assist for all ADL/IADL at baseline, requires MAX A for tranfers to mwc out of bed. Pt is no longer using sit to stand lift per daughter. Many ADLs completed at bed level. Pt requires MOD A for supine>sit, MAX A for squat pivot transfer to bedside chair. Feeding tasks completed with SET UP. Grooming tasks completed with set up. Pt presents with deficits in strength, endurance, activity tolerance all affecting optimal ADL completion and is weaker than PLOF, therefore STR is recommended.Pt daughter confirms she would like pt to return home and she is able to provide necessary assist for all ADL/mobility needs. Recommend HHOT if pt returns home. Pt daughter requests information on an electric hospital bed. Team notified. Pt is left in bedside chair, NAD, all needs met. OT will follow acutely.      Recommendations for follow up therapy are one component of a multi-disciplinary discharge planning process, led by the attending physician.  Recommendations may be updated based on patient status, additional functional criteria and insurance authorization.   Follow Up Recommendations  Skilled nursing-short term rehab (<3 hours/day) (pt daugther reports she would like her to return home to resume [place] with 24/7 care)    Assistance Recommended at Discharge Frequent or constant Supervision/Assistance  Patient can return home with the following A lot of help with walking and/or transfers;A lot of help  with bathing/dressing/bathroom    Functional Status Assessment  Patient has had a recent decline in their functional status and demonstrates the ability to make significant improvements in function in a reasonable and predictable amount of time.  Equipment Recommendations  Hospital bed;Other (comment) (pt daugther requesting information on electric hospital bed)    Recommendations for Other Services       Precautions / Restrictions Precautions Precautions: Fall Restrictions Weight Bearing Restrictions: No      Mobility Bed Mobility Overal bed mobility: Needs Assistance Bed Mobility: Supine to Sit     Supine to sit: Mod assist, HOB elevated          Transfers Overall transfer level: Needs assistance   Transfers: Sit to/from Stand, Bed to chair/wheelchair/BSC Sit to Stand: Max assist (unable to achieve full upright standing with RW)          Lateral/Scoot Transfers: Max assist        Balance Overall balance assessment: Needs assistance Sitting-balance support: Feet supported Sitting balance-Leahy Scale: Fair Sitting balance - Comments: poor dyamic sitting balance     Standing balance-Leahy Scale: Zero                             ADL either performed or assessed with clinical judgement   ADL Overall ADL's : Needs assistance/impaired Eating/Feeding: Set up;Sitting   Grooming: Wash/dry face;Moderate assistance                   Toilet Transfer: Maximal assistance Toilet Transfer Details (indicate cue type and reason): simulated, lateral scoot transfer to bedside chair Toileting-  Clothing Manipulation and Hygiene: Maximal assistance               Vision Patient Visual Report: No change from baseline       Perception     Praxis      Pertinent Vitals/Pain Pain Assessment Pain Assessment: No/denies pain     Hand Dominance     Extremity/Trunk Assessment Upper Extremity Assessment Upper Extremity Assessment: Generalized  weakness   Lower Extremity Assessment Lower Extremity Assessment: Generalized weakness       Communication     Cognition Arousal/Alertness: Awake/alert Behavior During Therapy: WFL for tasks assessed/performed Overall Cognitive Status: No family/caregiver present to determine baseline cognitive functioning Area of Impairment: Orientation, Attention, Following commands, Awareness, Problem solving                 Orientation Level: Disoriented to, Time, Situation Current Attention Level: Sustained   Following Commands: Follows one step commands with increased time   Awareness: Emergent Problem Solving: Requires verbal cues, Requires tactile cues       General Comments       Exercises     Shoulder Instructions      Home Living Family/patient expects to be discharged to:: Private residence Living Arrangements: Children Available Help at Discharge: Family;Available 24 hours/day Type of Home: House Home Access: Ramped entrance     Home Layout: One level     Bathroom Shower/Tub: Walk-in shower;Sponge bathes at baseline   [employer]: [place]: Shower seat;Grab bars - tub/shower;Wheelchair - manual;BSC/3in1;Rollator (4 wheels);Other (comment)   Additional Comments: manual hospital bed, sit to stand lift      Prior Functioning/Environment Prior Level of Function : Needs assist       Physical Assist : Mobility (physical);ADLs (physical) Mobility (physical): Bed mobility;Transfers;Gait;Stairs ADLs (physical): Grooming;Bathing;Dressing;Toileting;IADLs Mobility Comments: pt reports she stays in the bed most of the time, daugther reports MAX A for tranfser from bed>wheelchair when she can tolerate, no longer using sit to stand lift ADLs Comments: per pt and daugther on phone pt requires assist for all ADL/IADL        OT Problem List: Decreased strength;Decreased activity tolerance      OT Treatment/Interventions: Self-care/ADL  training;Patient/family education;Therapeutic exercise;Energy conservation;DME and/or AE instruction;Therapeutic activities    OT Goals(Current goals can be found in the care plan section) Acute Rehab OT Goals Patient Stated Goal: go home OT Goal Formulation: With patient Time For Goal Achievement: 11/14/21 Potential to Achieve Goals: Good  OT Frequency: Min 2X/week    Co-evaluation              AM-PAC OT "6 Clicks" Daily Activity     Outcome Measure Help from another person eating meals?: A Little Help from another person taking care of personal grooming?: A Little Help from another person toileting, which includes using toliet, bedpan, or urinal?: A Lot Help from another person bathing (including washing, rinsing, drying)?: A Lot Help from another person to put on and taking off regular upper body clothing?: A Lot Help from another person to put on and taking off regular lower body clothing?: A Lot 6 Click Score: 14   End of Session Equipment Utilized During Treatment: Gait belt Nurse Communication: Mobility status  Activity Tolerance: Patient tolerated treatment well Patient left: in chair;with call bell/phone within reach;with chair alarm set  OT Visit Diagnosis: Unsteadiness on feet (R26.81);Muscle weakness (generalized) (M62.81)  Time: 3979-5369 OT Time Calculation (min): 23 min Charges:  OT General Charges $OT Visit: 1 Visit OT Evaluation $OT Eval Low Complexity: 1 Low  Shanon Payor, OTD OTR/L  10/31/21, 9:45 AM

## 2021-10-31 NOTE — Discharge Summary (Signed)
\ Physician Discharge Summary  Amanda Benson MIW:[id number] DOB: 02/17/1936 DOA: 10/28/2021  PCP: Abner Greenspan, MD  Admit date: 10/28/2021 Discharge date: 10/31/2021  Admitted From: Home Disposition: Home with home health  Recommendations for Outpatient Follow-up:  Follow up with PCP in 1-2 weeks   Home Health: Yes PT OT RN aide Equipment/Devices: Yes electric hospital bed  Discharge Condition: Stable CODE STATUS: DNR Diet recommendation: Regular  Brief/Interim Summary: 86 year old female who was admitted with working diagnosis of sepsis secondary to gram-negative UTI.  Patient is on IV Rocephin and intravenous fluids.  Reports interval improvement.  Kidney function improving.     Patient reluctant for endoscopy at this time.  Is tolerating p.o. intake without nausea or vomiting.  No swallowing issues.  Defer GI evaluation for now and complete treatment for sepsis/urinary tract infection.   Patient completed course of treatment for urinary tract infection.  Clinical status improved at time of discharge.  Another discussion regarding the findings of esophageal thickening noted on CT scan.  Patient electing to defer endoscopic evaluation at this time.  Considering the patient is tolerating p.o. intake without nausea or vomiting or abdominal pain okay to defer GI evaluation.  Will place ambulatory referral to gastroenterology at time of discharge.  Plan of care discussed with daughter at via phone.  Stable for discharge at this time.  Will recommend Zofran as needed and daily Protonix at time of DC.    Discharge Diagnoses:  Principal Problem:   Sepsis due to gram-negative UTI ([place]) Active Problems:   AKI (acute kidney injury) ([place])   Hyponatremia   Acute esophagitis   Essential hypertension   Anxiety and depression   Type 2 diabetes mellitus without complications (HCC)  Gram-negative UTI Severe sepsis secondary to above Patient clinically improving over interval.  Urine  culture with GNR's, Proteus mirabilis isolated.  Pansensitive.  Sepsis physiology resolved.  Leukocytosis resolved.  Tolerating p.o. intake.  Stable for discharge home.  Received 3 days of IV Rocephin in house.  No further antibiotics indicated for uncomplicated cystitis.   Acute kidney injury, improved Likely prerenal due to hypovolemia from recurrent nausea and vomiting.  Unable to exclude ATN Improving on IV fluids Creatinine at or near baseline at time of discharge.  Okay to resume home medication regimen   Esophageal thickening/acute esophagitis Appears improved.  Patient tolerating p.o. intake without any nausea or vomiting.  Continue PPI.  Offered GI consultation however patient reluctant at this time.  As patient is tolerating p.o. intake will defer for now.  Ambulatory referral to gastroenterology placed at time of discharge.  Daily PPI prescribed.  As needed Zofran prescribed.  Discharge Instructions  Discharge Instructions     Ambulatory referral to Gastroenterology   Complete by: As directed    What is the reason for referral?: Other Comment - Endoscopy evaluation for esophagitis/suspected Barretts esophagus   Diet - low sodium heart healthy   Complete by: As directed    Increase activity slowly   Complete by: As directed       Allergies as of 10/31/2021       Reactions   Alendronate Sodium    REACTION: heartburn        Medication List     STOP taking these medications    doxycycline 100 MG tablet Commonly known as: VIBRA-TABS       TAKE these medications    Accu-Chek Softclix Lancets lancets SMARTSIG:Topical 1-4 Times Daily   amLODipine 10 MG tablet Commonly known  as: NORVASC TAKE ONE TABLET BY MOUTH EVERY MORNING What changed:  when to take this additional instructions   blood glucose meter kit and supplies Kit Dispense based on patient and insurance preference. Use up to four times daily as directed.   buPROPion 300 MG 24 hr tablet Commonly  known as: WELLBUTRIN XL TAKE 1 TABLET(300 MG) BY MOUTH DAILY   Cool Blood Glucose Test Strips test strip Generic drug: glucose blood To check blood glucose qd and prn for DM2   FLUoxetine 40 MG capsule Commonly known as: PROZAC TAKE ONE CAPSULE BY MOUTH EVERY MORNING What changed: when to take this   glipiZIDE 10 MG 24 hr tablet Commonly known as: GLUCOTROL XL TAKE ONE TABLET BY MOUTH EVERY MORNING What changed: when to take this   hydrochlorothiazide 12.5 MG tablet Commonly known as: HYDRODIURIL Take 1 tablet (12.5 mg total) by mouth every morning.   Insulin Pen Needle 29G X 12MM Misc Use with insulin daily   loratadine 10 MG tablet Commonly known as: CLARITIN Take 10 mg by mouth daily.   LORazepam 0.5 MG tablet Commonly known as: ATIVAN Take 0.5 mg by mouth daily as needed (agitation).   ondansetron 4 MG tablet Commonly known as: ZOFRAN Take 1 tablet (4 mg total) by mouth every 6 (six) hours as needed for nausea.   pantoprazole 40 MG tablet Commonly known as: PROTONIX Take 1 tablet (40 mg total) by mouth daily.   raloxifene 60 MG tablet Commonly known as: EVISTA TAKE 1 TABLET(60 MG) BY MOUTH DAILY   traMADol 50 MG tablet Commonly known as: ULTRAM Take 100 mg by mouth every 8 (eight) hours as needed.   traZODone 50 MG tablet Commonly known as: DESYREL Take 2 tablets (100 mg total) by mouth at bedtime as needed for sleep.               Durable Medical Equipment  (From admission, onward)           Start     Ordered   10/31/21 1024  For home use only DME Hospital bed  Once       Question Answer Comment  Length of Need Lifetime   The above medical condition requires: Patient requires the ability to reposition frequently   Head must be elevated greater than: 45 degrees   Bed type Semi-electric      10/31/21 1023            Allergies  Allergen Reactions   Alendronate Sodium     REACTION: heartburn     Consultations: None   Procedures/Studies: CT ABDOMEN PELVIS W CONTRAST  Result Date: 10/29/2021 CLINICAL DATA:  Nausea and vomiting x5 days. EXAM: CT ABDOMEN AND PELVIS WITH CONTRAST TECHNIQUE: Multidetector CT imaging of the abdomen and pelvis was performed using the standard protocol following bolus administration of intravenous contrast. RADIATION DOSE REDUCTION: This exam was performed according to the departmental dose-optimization program which includes automated exposure control, adjustment of the mA and/or kV according to patient size and/or use of iterative reconstruction technique. CONTRAST:  20mL OMNIPAQUE IOHEXOL 300 MG/ML  SOLN COMPARISON:  None Available. FINDINGS: Lower chest: Mild linear atelectasis is seen within the left lung base. Hepatobiliary: No focal liver abnormality is seen. Status post cholecystectomy. The common bile duct measures 1.1 cm in diameter. Pancreas: Unremarkable. No pancreatic ductal dilatation or surrounding inflammatory changes. Spleen: Normal in size without focal abnormality. Adrenals/Urinary Tract: Adrenal glands are unremarkable. Kidneys are normal in size, without renal calculi or  hydronephrosis. Marked severity cortical thinning is seen along the anterior aspect of the mid to lower left kidney. The urinary bladder is poorly distended and subsequently limited in evaluation. Anterior urinary bladder wall thickening is seen. Stomach/Bowel: There is marked severity thickening of the visualized portion of the distal esophagus which leads into a moderate sized hiatal hernia. A small amount of adjacent free fluid is noted. Appendix appears normal. No evidence of bowel wall thickening, distention, or inflammatory changes. Noninflamed diverticula are seen throughout the descending and sigmoid colon. Vascular/Lymphatic: Aortic atherosclerosis. No enlarged abdominal or pelvic lymph nodes. Reproductive: Uterus and bilateral adnexa are unremarkable. Other: No abdominal  wall hernia or abnormality. No abdominopelvic ascites. Musculoskeletal: Degenerative changes are seen throughout the lumbar spine. IMPRESSION: 1. Thickening of the distal esophagus, as described above, which may represent sequelae associated with esophagitis. Further evaluation with endoscopy is recommended to exclude the presence of an underlying neoplastic process. 2. Colonic diverticulosis. 3. Marked severity cortical thinning along the anterior aspect of the mid to lower left kidney. 4. Aortic atherosclerosis. Aortic Atherosclerosis (ICD10-I70.0). Electronically Signed   By: Virgina Norfolk M.D.   On: 10/29/2021 00:46      Subjective: Seen and examined the day of discharge.  Stable no distress.  Tolerating p.o. intake.  Stable for discharge home.  Discharge Exam: Vitals:   10/31/21 0429 10/31/21 0736  BP: (!) 143/58 (!) 154/66  Pulse: 87 88  Resp: 16 16  Temp: 98.2 F (36.8 C) 97.6 F (36.4 C)  SpO2: 98% 99%   Vitals:   10/30/21 0800 10/30/21 1651 10/31/21 0429 10/31/21 0736  BP: 132/67 (!) 141/55 (!) 143/58 (!) 154/66  Pulse: 95 97 87 88  Resp: $Remo'16 16 16 16  'YiiYt$ Temp: 97.6 F (36.4 C) 97.7 F (36.5 C) 98.2 F (36.8 C) 97.6 F (36.4 C)  TempSrc:      SpO2: 100% 100% 98% 99%  Weight:      Height:        General: Pt is alert, awake, not in acute distress Cardiovascular: RRR, S1/S2 +, no rubs, no gallops Respiratory: CTA bilaterally, no wheezing, no rhonchi Abdominal: Soft, NT, ND, bowel sounds + Extremities: no edema, no cyanosis    The results of significant diagnostics from this hospitalization (including imaging, microbiology, ancillary and laboratory) are listed below for reference.     Microbiology: Recent Results (from the past 240 hour(s))  Blood culture (routine x 2)     Status: None (Preliminary result)   Collection Time: 10/29/21 12:21 AM   Specimen: BLOOD  Result Value Ref Range Status   Specimen Description BLOOD LEFT [facility]  Final   Special Requests   Final     BOTTLES DRAWN AEROBIC AND ANAEROBIC Blood Culture adequate volume   Culture   Final    NO GROWTH 2 DAYS Performed at [facility], [address], [place], [place] [address]    Report Status PENDING  Incomplete  Urine Culture     Status: Abnormal   Collection Time: 10/29/21 12:22 AM   Specimen: In/Out Cath Urine  Result Value Ref Range Status   Specimen Description   Final    IN/OUT CATH URINE Performed at [facility], [address], [place], [place] [address]    Special Requests   Final    NONE Performed at [facility], [address], [place], [place] [address]    Culture 100 [place] (A)  Final   Report Status 10/31/2021 FINAL  Final  Organism ID, Bacteria PROTEUS MIRABILIS (A)  Final      Susceptibility   Proteus mirabilis - MIC*    AMPICILLIN <=2 SENSITIVE Sensitive     CEFAZOLIN <=4 SENSITIVE Sensitive     CEFEPIME <=0.12 SENSITIVE Sensitive     CEFTRIAXONE <=0.25 SENSITIVE Sensitive     CIPROFLOXACIN <=0.25 SENSITIVE Sensitive     GENTAMICIN <=1 SENSITIVE Sensitive     IMIPENEM 2 SENSITIVE Sensitive     NITROFURANTOIN 128 RESISTANT Resistant     TRIMETH/SULFA <=20 SENSITIVE Sensitive     AMPICILLIN/SULBACTAM <=2 SENSITIVE Sensitive     PIP/TAZO <=4 SENSITIVE Sensitive     * 100 COLONIES/mL PROTEUS MIRABILIS  Blood culture (routine x 2)     Status: None (Preliminary result)   Collection Time: 10/29/21  3:37 AM   Specimen: BLOOD  Result Value Ref Range Status   Specimen Description BLOOD LEFT FA  Final   Special Requests   Final    BOTTLES DRAWN AEROBIC ONLY Blood Culture adequate volume   Culture   Final    NO GROWTH 2 DAYS Performed at [facility], [address], [place], [place] [address]    Report Status PENDING  Incomplete     Labs: BNP (last 3 results) No results for input(s): "BNP" in the last 8760 hours. Basic Metabolic Panel: Recent Labs  Lab 10/28/21 2136 10/29/21 0337  10/30/21 0742 10/31/21 0851  NA 129* 129* 136 134*  K 3.7 3.4* 3.8 3.2*  CL 92* 92* 104 105  CO2 $Re'27 29 26 'gyt$ 20*  GLUCOSE 266* 283* 89 103*  BUN 48* 52* 40* 32*  CREATININE 1.23* 1.28* 1.08* 1.02*  CALCIUM 8.9 8.7* 8.4* 8.3*   Liver Function Tests: Recent Labs  Lab 10/28/21 2136  AST 32  ALT 23  ALKPHOS 114  BILITOT 1.0  PROT 6.0*  ALBUMIN 2.1*   Recent Labs  Lab 10/28/21 2136  LIPASE 32   No results for input(s): "AMMONIA" in the last 168 hours. CBC: Recent Labs  Lab 10/28/21 2136 10/29/21 0337 10/30/21 0742 10/31/21 0851  WBC 21.7* 31.1* 21.8* 13.5*  NEUTROABS  --   --  18.8* 11.3*  HGB 13.2 12.4 11.6* 12.2  HCT 40.5 38.0 35.7* 38.2  MCV 81.3 81.7 85.0 84.7  PLT 631* 561* 506* 524*   Cardiac Enzymes: No results for input(s): "CKTOTAL", "CKMB", "CKMBINDEX", "TROPONINI" in the last 168 hours. BNP: Invalid input(s): "POCBNP" CBG: Recent Labs  Lab 10/30/21 1151 10/30/21 1625 10/30/21 2048 10/31/21 0744 10/31/21 1150  GLUCAP 203* 111* 229* 79 164*   D-Dimer No results for input(s): "DDIMER" in the last 72 hours. Hgb A1c Recent Labs    10/29/21 0629  HGBA1C 8.0*   Lipid Profile No results for input(s): "CHOL", "HDL", "LDLCALC", "TRIG", "CHOLHDL", "LDLDIRECT" in the last 72 hours. Thyroid function studies No results for input(s): "TSH", "T4TOTAL", "T3FREE", "THYROIDAB" in the last 72 hours.  Invalid input(s): "FREET3" Anemia work up No results for input(s): "VITAMINB12", "FOLATE", "FERRITIN", "TIBC", "IRON", "RETICCTPCT" in the last 72 hours. Urinalysis    Component Value Date/Time   COLORURINE YELLOW (A) 10/29/2021 0022   APPEARANCEUR CLOUDY (A) 10/29/2021 0022   LABSPEC 1.024 10/29/2021 0022   PHURINE 6.0 10/29/2021 0022   GLUCOSEU >=500 (A) 10/29/2021 0022   HGBUR MODERATE (A) 10/29/2021 0022   HGBUR trace-intact 05/01/2008 1218   BILIRUBINUR NEGATIVE 10/29/2021 0022   BILIRUBINUR Negative 05/28/2020 1707   KETONESUR NEGATIVE 10/29/2021  0022   PROTEINUR NEGATIVE 10/29/2021 0022  UROBILINOGEN 0.2 05/28/2020 1707   UROBILINOGEN 0.2 05/01/2008 1218   NITRITE NEGATIVE 10/29/2021 0022   LEUKOCYTESUR LARGE (A) 10/29/2021 0022   Sepsis Labs Recent Labs  Lab 10/28/21 2136 10/29/21 0337 10/30/21 0742 10/31/21 0851  WBC 21.7* 31.1* 21.8* 13.5*   Microbiology Recent Results (from the past 240 hour(s))  Blood culture (routine x 2)     Status: None (Preliminary result)   Collection Time: 10/29/21 12:21 AM   Specimen: BLOOD  Result Value Ref Range Status   Specimen Description BLOOD LEFT AC  Final   Special Requests   Final    BOTTLES DRAWN AEROBIC AND ANAEROBIC Blood Culture adequate volume   Culture   Final    NO GROWTH 2 DAYS Performed at [facility], [address] [place], [place] [address]    Report Status PENDING  Incomplete  Urine Culture     Status: Abnormal   Collection Time: 10/29/21 12:22 AM   Specimen: In/Out Cath Urine  Result Value Ref Range Status   Specimen Description   Final    IN/OUT CATH URINE Performed at [facility], [address] [place], [place] [address]    Special Requests   Final    NONE Performed at [facility], [place], [place] [address]    Culture 100 COLONIES/mL PROTEUS MIRABILIS (A)  Final   Report Status 10/31/2021 FINAL  Final   Organism ID, Bacteria PROTEUS MIRABILIS (A)  Final      Susceptibility   Proteus mirabilis - MIC*    AMPICILLIN <=2 SENSITIVE Sensitive     CEFAZOLIN <=4 SENSITIVE Sensitive     CEFEPIME <=0.12 SENSITIVE Sensitive     CEFTRIAXONE <=0.25 SENSITIVE Sensitive     CIPROFLOXACIN <=0.25 SENSITIVE Sensitive     GENTAMICIN <=1 SENSITIVE Sensitive     IMIPENEM 2 SENSITIVE Sensitive     NITROFURANTOIN 128 RESISTANT Resistant     TRIMETH/SULFA <=20 SENSITIVE Sensitive     AMPICILLIN/SULBACTAM <=2 SENSITIVE Sensitive     PIP/TAZO <=4 SENSITIVE Sensitive     * 100 COLONIES/mL PROTEUS MIRABILIS  Blood  culture (routine x 2)     Status: None (Preliminary result)   Collection Time: 10/29/21  3:37 AM   Specimen: BLOOD  Result Value Ref Range Status   Specimen Description BLOOD LEFT FA  Final   Special Requests   Final    BOTTLES DRAWN AEROBIC ONLY Blood Culture adequate volume   Culture   Final    NO GROWTH 2 DAYS Performed at [facility], [address] [place], [place] [address]    Report Status PENDING  Incomplete     Time coordinating discharge: Over 30 minutes  SIGNED:   Sidney Ace, MD  [facility] 10/31/2021, 12:49 PM Pager   If 7PM-7AM, please contact night-coverage

## 2021-10-31 NOTE — TOC Initial Note (Addendum)
Transition of Care [facility]) - Initial/Assessment Note    Patient Details  Name: Amanda Benson MRN: [id number] Date of Birth: 28-Apr-1935  Transition of Care [facility]) CM/SW Contact:    Luvenia Redden, RN Phone [phone] 10/31/2021, 10:18 AM  Clinical Narrative:                 Spoke with [name] (Daughter) concerning the request for a electric hospital bed for pt. Daughter indicates pt lives in a home with another daughter Leonides Sake and has several DME items: cane, 3 RW, bedside-commode and a sit to stand device. Verified family to able to accommodate pt's medications and possible get pt to all her medical appointments however states pt will need EMS services for a transport home. States there is family at the pt's home but unable to transport pt to her home. Pt has existing [facility] in the home ([facility]) who will resume services.   [facility]) notified for the electric hospital bed. [facility]ick[facility]) aware of pt's discharge disposition and to resume [facility]. TOC will continue to update team on pt's discharge needs.  Expected Discharge Plan: Home w Home Health Services Barriers to Discharge: Continued Medical Work up   Patient Goals and CMS Choice        Expected Discharge Plan and Services Expected Discharge Plan: Home w Home Health Services   Discharge Planning Services: CM Consult   Living arrangements for the past 2 months: Single Family Home                 DME Arranged: Hospital bed DME Agency: [facility] Date DME Agency Contacted: 10/31/21 Time DME Agency Contacted: 1012 Representative spoke with at DME Agency: Leavy Cella            Prior Living Arrangements/Services Living arrangements for the past 2 months: Single Family Home Lives with:: Adult Children   Do you feel safe going back to the place where you live?: Yes      Need for Family Participation in Patient Care: Yes (Comment) Care giver support system in place?: Yes (comment)    Criminal Activity/Legal Involvement Pertinent to Current Situation/Hospitalization: No - Comment as needed  Activities of Daily Living Home Assistive Devices/Equipment: Wheelchair ADL Screening (condition at time of admission) Patient's cognitive ability adequate to safely complete daily activities?: Yes Is the patient deaf or have difficulty hearing?: Yes Does the patient have difficulty seeing, even when wearing glasses/contacts?: No Does the patient have difficulty concentrating, remembering, or making decisions?: No Patient able to express need for assistance with ADLs?: Yes Does the patient have difficulty dressing or bathing?: Yes Independently performs ADLs?: No Communication: Needs assistance Dressing (OT): Independent Grooming: Needs assistance Feeding: Independent Bathing: Needs assistance Toileting: Needs assistance In/Out Bed: Needs assistance Walks in Home: Needs assistance Does the patient have difficulty walking or climbing stairs?: Yes Weakness of Legs: None Weakness of Arms/Hands: None  Permission Sought/Granted   Permission granted to share information with : Yes, Verbal Permission Granted              Emotional Assessment Appearance:: Appears stated age Attitude/Demeanor/Rapport: Engaged Affect (typically observed): Accepting Orientation: : Oriented to Self Alcohol / Substance Use: Not Applicable Psych Involvement: No (comment)  Admission diagnosis:  Elevated lactic acid level [R79.89] Esophagitis [K20.90] Urinary tract infection without hematuria, site unspecified [N39.0] Sepsis due to gram-negative UTI (HCC) [A41.50, N39.0] Sepsis, due to unspecified organism, unspecified whether acute organ dysfunction present (HCC) [A41.9] Nausea and vomiting, unspecified vomiting type [R11.2] Patient Active  Problem List   Diagnosis Date Noted   Sepsis due to gram-negative UTI (HCC) 10/29/2021   AKI (acute kidney injury) (HCC) 10/29/2021   Type 2 diabetes  mellitus without complications (HCC) 10/29/2021   Acute esophagitis 10/29/2021   History of CVA (cerebrovascular accident) 06/04/2021   Hyperlipidemia associated with type 2 diabetes mellitus (HCC) 06/04/2021   Fatigue 10/05/2020   Hypercalcemia 10/03/2020   Swallowing difficulty 10/03/2020   Type II or unspecified type diabetes mellitus without mention of complication, not stated as uncontrolled 06/24/2020   Other and unspecified hyperlipidemia 06/24/2020   Hypothyroidism 06/24/2020   Hypophosphatemia    Hypomagnesemia    Hypokalemia    B12 deficiency    Hyponatremia 06/21/2020   H/O sepsis 12/09/2019   CKD (chronic kidney disease) 11/23/2019   Leukocytosis 11/23/2019   Mobility impaired 10/13/2019   Incontinence of urine 10/12/2019   Grief reaction 03/27/2019   Elevated serum creatinine 06/13/2018   Dysuria 06/13/2018   Hearing loss 01/02/2018   Subclinical hypothyroidism 10/26/2017   Anxiety and depression 08/24/2016   Falls frequently 08/24/2016   Generalized weakness 08/24/2016   Palliative care status 05/16/2015   Breast calcifications on mammogram 03/01/2014   Encounter for Medicare annual wellness exam 05/30/2013   Breast calcification seen on mammogram 12/14/2012   Breast calcification, left 11/28/2012   History of falling 11/27/2012   Poor balance 11/27/2012   Colon cancer screening 05/10/2012   Osteoporosis 06/21/2007   Type 2 diabetes mellitus with diabetic neuropathy, unspecified (HCC) 09/20/2006   Essential hypertension 09/20/2006   Osteoarthritis 09/20/2006   PCP:  Judy Pimple, MD Pharmacy:   [facility] - [place], [place] - [address] [place] [address] Phone: [phone] Fax: [phone]     Social Determinants of Health (SDOH) Interventions    Readmission Risk Interventions     No data to display

## 2021-10-31 NOTE — Progress Notes (Signed)
Patient VS taken, discharge papers given to EMS staff Dan Humphreys.

## 2021-10-31 NOTE — Evaluation (Signed)
Physical Therapy Evaluation Patient Details Name: Amanda Benson MRN: [id number] DOB: [birth year] Today's Date: 10/31/2021  History of Present Illness  Pt is an 86 y.o. female presenting to hospital 7/26 for evaluation of vomiting and nausea for 5 days.  Pt admitted with sepsis d/t gram-negative UTI, AKI, acute esophagitis, and hyponatremia.  PMH includes DM, htn, depression, osteoporosis, and L ankle surgery.  Clinical Impression  Prior to hospital admission, pt was max assist for transfers (to manual w/c); no longer using sit to stand lift; lives with her daughter who provides needed assist.  Pt sitting in recliner upon PT arrival and reporting her back was sore from sitting in recliner and requesting back to bed.  Performed lateral scoot recliner to bed with max assist x1.  After transferring to bed, pt noted to have been incontinent of stool in recliner (pt reports she can't tell when she goes).  Pt assisted back to bed and NT came to assist with bed linen change and pt hygiene/clean-up.  Pt would benefit from skilled PT to address noted impairments and functional limitations (see below for any additional details).  Upon hospital discharge, pt would benefit from HHPT and 24/7 assist.    Recommendations for follow up therapy are one component of a multi-disciplinary discharge planning process, led by the attending physician.  Recommendations may be updated based on patient status, additional functional criteria and insurance authorization.  Follow Up Recommendations Home health PT      Assistance Recommended at Discharge Frequent or constant Supervision/Assistance  Patient can return home with the following  A lot of help with walking and/or transfers;A lot of help with bathing/dressing/bathroom;Assistance with cooking/housework;Direct supervision/assist for medications management;Assist for transportation;Help with stairs or ramp for entrance    Equipment Recommendations Hospital bed   Recommendations for Other Services       Functional Status Assessment Patient has had a recent decline in their functional status and demonstrates the ability to make significant improvements in function in a reasonable and predictable amount of time.     Precautions / Restrictions Precautions Precautions: Fall Restrictions Weight Bearing Restrictions: No      Mobility  Bed Mobility Overal bed mobility: Needs Assistance Bed Mobility: Sit to Supine, Rolling Rolling: Min assist, Mod assist     Sit to supine: Mod assist, HOB elevated   General bed mobility comments: assist for trunk and B LE's sit to supine in bed; min to mod assist for logrolling in bed    Transfers Overall transfer level: Needs assistance Equipment used: None Transfers: Bed to chair/wheelchair/BSC            Lateral/Scoot Transfers: Max assist General transfer comment: vc's for technique    Ambulation/Gait               General Gait Details: Deferred (pt non-ambulatory)  Stairs            Wheelchair Mobility    Modified Rankin (Stroke Patients Only)       Balance Overall balance assessment: Needs assistance Sitting-balance support: Bilateral upper extremity supported, Feet supported Sitting balance-Leahy Scale: Fair Sitting balance - Comments: steady static sitting                                     Pertinent Vitals/Pain Pain Assessment Pain Assessment: No/denies pain    Home Living Family/patient expects to be discharged to:: Private residence Living Arrangements: Children Available Help  at Discharge: Family;Available 24 hours/day Type of Home: House Home Access: Ramped entrance       Home Layout: One level Home Equipment: Shower seat;Grab bars - tub/shower;Wheelchair - manual;BSC/3in1;Rollator (4 wheels);Other (comment) Additional Comments: manual hospital bed, sit to stand lift    Prior Function Prior Level of Function : Needs assist        Physical Assist : Mobility (physical);ADLs (physical) Mobility (physical): Bed mobility;Transfers;Gait;Stairs ADLs (physical): Grooming;Bathing;Dressing;Toileting;IADLs Mobility Comments: Pt reports staying in bed most of the time.  Per OT, pt's daughter reports pt max assist for transfer from bed to/from w/c when she can tolerate (no longer using sit to stand lift) ADLs Comments: Per OT eval "per pt and daugther on phone pt requires assist for all ADL/IADL"     Hand Dominance        Extremity/Trunk Assessment   Upper Extremity Assessment Upper Extremity Assessment: Generalized weakness    Lower Extremity Assessment Lower Extremity Assessment: Generalized weakness    Cervical / Trunk Assessment Cervical / Trunk Assessment: Other exceptions Cervical / Trunk Exceptions: forward head/shoulders  Communication   Communication: HOH  Cognition Arousal/Alertness: Awake/alert Behavior During Therapy: WFL for tasks assessed/performed Overall Cognitive Status: No family/caregiver present to determine baseline cognitive functioning Area of Impairment: Orientation, Attention, Following commands, Awareness, Problem solving                 Orientation Level: Disoriented to, Time, Situation Current Attention Level: Sustained   Following Commands: Follows one step commands with increased time   Awareness: Emergent Problem Solving: Requires verbal cues, Requires tactile cues          General Comments  Nursing cleared pt for participation in physical therapy.  Pt agreeable to PT session.    Exercises     Assessment/Plan    PT Assessment Patient needs continued PT services  PT Problem List Decreased strength;Decreased activity tolerance;Decreased balance;Decreased mobility       PT Treatment Interventions DME instruction;Functional mobility training;Therapeutic activities;Therapeutic exercise;Balance training;Patient/family education    PT Goals (Current goals can be  found in the Care Plan section)  Acute Rehab PT Goals Patient Stated Goal: to go home PT Goal Formulation: With patient Time For Goal Achievement: 11/14/21 Potential to Achieve Goals: Good    Frequency Min 2X/week     Co-evaluation               AM-PAC PT "6 Clicks" Mobility  Outcome Measure Help needed turning from your back to your side while in a flat bed without using bedrails?: A Lot Help needed moving from lying on your back to sitting on the side of a flat bed without using bedrails?: A Lot Help needed moving to and from a bed to a chair (including a wheelchair)?: A Lot Help needed standing up from a chair using your arms (e.g., wheelchair or bedside chair)?: A Lot Help needed to walk in hospital room?: Total Help needed climbing 3-5 steps with a railing? : Total 6 Click Score: 10    End of Session Equipment Utilized During Treatment: Gait belt Activity Tolerance: Patient limited by fatigue Patient left: in bed;with call bell/phone within reach;with bed alarm set;with nursing/sitter in room (NT present finishing with pt care needs) Nurse Communication: Mobility status;Precautions;Other (comment) (pt incontinent of stool) PT Visit Diagnosis: Other abnormalities of gait and mobility (R26.89);Muscle weakness (generalized) (M62.81)    Time: 4081-4481 PT Time Calculation (min) (ACUTE ONLY): 36 min   Charges:   PT Evaluation $PT Eval Low Complexity:  1 Low         Hendricks Limes, PT 10/31/21, 5:37 PM

## 2021-10-31 NOTE — Plan of Care (Signed)
  Problem: Nutrition: Goal: Adequate nutrition will be maintained Outcome: Progressing   Problem: Coping: Goal: Level of anxiety will decrease Outcome: Progressing   Problem: Pain Managment: Goal: General experience of comfort will improve Outcome: Progressing   Problem: Clinical Measurements: Goal: Respiratory complications will improve Outcome: Progressing

## 2021-10-31 NOTE — TOC Progression Note (Signed)
    Durable Medical Equipment  (From admission, onward)           Start     Ordered   10/31/21 1024  For home use only DME Hospital bed  Once       Question Answer Comment  Length of Need Lifetime   The above medical condition requires: Patient requires the ability to reposition frequently   Head must be elevated greater than: 45 degrees   Bed type Semi-electric      10/31/21 1023

## 2021-10-31 NOTE — Progress Notes (Signed)
Another consult was placed  to the IV Therapist to restart an IV that was just placed by ultrasound at 7am;  a secure chat was sent to Dr Georgeann Oppenheim, who replied back to leave the IV out;  Karleen Hampshire RN made aware.

## 2021-11-02 ENCOUNTER — Telehealth: Payer: Self-pay

## 2021-11-02 NOTE — Telephone Encounter (Signed)
Transition Care Management Follow-up Telephone Call Date of discharge and from where: [facility], sepsis, 11/01/21 How have you been since you were released from the hospital? Contacted pt's daughter, [name], who reports pt is doing much better. Any questions or concerns? No  Items Reviewed: Did the pt receive and understand the discharge instructions provided? Yes  Medications obtained and verified? Yes , new prescription for protonix Other? No  Any new allergies since your discharge? No  Dietary orders reviewed? No Do you have support at home? Yes , family  Home Care and Equipment/Supplies: Were home health services ordered? No, pt already has [facility] visiting If so, what is the name of the agency? [facility]  Has the agency set up a time to come to the patient's home? not applicable Were any new equipment or medical supplies ordered?  Yes: , hospital bed What is the name of the medical supply agency? unsure Were you able to get the supplies/equipment? Unsure if they can afford it with the copay Do you have any questions related to the use of the equipment or supplies? No  Functional Questionnaire: (I = Independent and D = Dependent) ADLs: D  Bathing/Dressing- D  Meal Prep- D  Eating- I  Maintaining continence- D  Transferring/Ambulation- D  Managing Meds- D  Follow up appointments reviewed:  PCP Hospital f/u appt confirmed? Yes  Scheduled to see Dr. Milinda Antis on 8/2 @ 2:30. Specialist Hospital f/u appt confirmed? No  . Are transportation arrangements needed? No  If their condition worsens, is the pt aware to call PCP or go to the Emergency Dept.? Yes Was the patient provided with contact information for the PCP's office or ED? Yes Was to pt encouraged to call back with questions or concerns? Yes Orlene Erm was with pt's daughter, [name]. She requested a mychart VV due to difficulty transporting pt. Daughter's number is [phone]

## 2021-11-03 LAB — CULTURE, BLOOD (ROUTINE X 2)
Culture: NO GROWTH
Culture: NO GROWTH
Special Requests: ADEQUATE
Special Requests: ADEQUATE

## 2021-11-04 ENCOUNTER — Encounter: Payer: Self-pay | Admitting: Family Medicine

## 2021-11-04 ENCOUNTER — Telehealth (INDEPENDENT_AMBULATORY_CARE_PROVIDER_SITE_OTHER): Payer: Medicare HMO | Admitting: Family Medicine

## 2021-11-04 DIAGNOSIS — A4150 Gram-negative sepsis, unspecified: Secondary | ICD-10-CM | POA: Diagnosis not present

## 2021-11-04 DIAGNOSIS — L98421 Non-pressure chronic ulcer of back limited to breakdown of skin: Secondary | ICD-10-CM

## 2021-11-04 DIAGNOSIS — E1140 Type 2 diabetes mellitus with diabetic neuropathy, unspecified: Secondary | ICD-10-CM | POA: Diagnosis not present

## 2021-11-04 DIAGNOSIS — B370 Candidal stomatitis: Secondary | ICD-10-CM

## 2021-11-04 DIAGNOSIS — L98429 Non-pressure chronic ulcer of back with unspecified severity: Secondary | ICD-10-CM | POA: Insufficient documentation

## 2021-11-04 DIAGNOSIS — N390 Urinary tract infection, site not specified: Secondary | ICD-10-CM

## 2021-11-04 DIAGNOSIS — N179 Acute kidney failure, unspecified: Secondary | ICD-10-CM | POA: Diagnosis not present

## 2021-11-04 DIAGNOSIS — K209 Esophagitis, unspecified without bleeding: Secondary | ICD-10-CM

## 2021-11-04 DIAGNOSIS — R1314 Dysphagia, pharyngoesophageal phase: Secondary | ICD-10-CM | POA: Diagnosis not present

## 2021-11-04 MED ORDER — NYSTATIN 100000 UNIT/ML MT SUSP: 5.0000 mL | Freq: Three times a day (TID) | OROMUCOSAL | 0 refills | Status: AC

## 2021-11-04 MED ORDER — PANTOPRAZOLE SODIUM 40 MG PO TBEC: 40.0000 mg | DELAYED_RELEASE_TABLET | Freq: Every day | ORAL | 0 refills | Status: AC

## 2021-11-04 NOTE — Assessment & Plan Note (Signed)
Suspect this is cause of her mouth and throat discomfort  Recent hosp with abx Px nystatin solution  Update if not starting to improve in a week or if worsening

## 2021-11-04 NOTE — Assessment & Plan Note (Signed)
Noted on CT Having trouble eating and stomach pain  Has not rec ppi (protonix) from [facility] yet  Sent short supply to local pharmacy to start asap  Pt adamantly declines any GI referral or outpt f/u  She does not want EGD She is in palliative care currently

## 2021-11-04 NOTE — Patient Instructions (Addendum)
I sent in nystatin for possible thrush  Also protonix (start this asap for esophagus)  Continue to reposition  Put desitin on the skin breakdown-if worse let me know Keep checking vital signs and glucose  I will reach out to our pharmacist about the [facility] px   Push fluids to prevent kidney failure   Start the generic protonix as asap for esophagitis   Call and schedule follow up appt in 2-3 weeks

## 2021-11-04 NOTE — Assessment & Plan Note (Signed)
When in hosp with uro sepsis Reviewed hospital records, lab results and studies in detail  Improved at d/c Difficult to get her to drink fluids  Enc this  F/u 2-3 wk Stann Mainland do labs here

## 2021-11-04 NOTE — Assessment & Plan Note (Signed)
Per pt's caregiver on L buttock This often happens after hosp  They re position often Has home care  Will continue desitin  F/u here as planned

## 2021-11-04 NOTE — Assessment & Plan Note (Signed)
Some low sugar in am  inst to hold glipizide if glucose is low  daughter monitors Plan f/u in 2-3 wk if able In palliative care

## 2021-11-04 NOTE — Assessment & Plan Note (Signed)
Suspect linked to finding of esophagitis on CT Needs to start her ppi asap

## 2021-11-04 NOTE — Assessment & Plan Note (Signed)
Reviewed hospital records, lab results and studies in detail  Proteus infection  Resolved with 3 d of rocephin IV in hospital  Clinically improved at home  Planning f/u in office

## 2021-11-04 NOTE — Progress Notes (Signed)
Virtual Visit via Video Note  I connected with Amanda Benson on 11/04/21 at  2:30 PM EDT by a video enabled telemedicine application and verified that I am speaking with the correct person using two identifiers.  Location: Patient: home  Provider: office    I discussed the limitations of evaluation and management by telemedicine and the availability of in person appointments. The patient expressed understanding and agreed to proceed.  Parties involved in encounter  Patient: Amanda Benson   Provider:  Loura Pardon MD   History of Present Illness: Pt presents for fu of hosp for urosepsis 7/26 to 7/29   Stomach is still bothering her -has not gotten the protonix yet   Also some breakdown on her bottom since coming home   When she eats -food tastes bad to her /sour  Tongue feels like it is burning with drinks  ? If possible thrush since abx   A/p:from discharge summary  Discharge Diagnoses:  Principal Problem:   Sepsis due to gram-negative UTI ([place]) Active Problems:   AKI (acute kidney injury) ([place])   Hyponatremia   Acute esophagitis   Essential hypertension   Anxiety and depression   Type 2 diabetes mellitus without complications (HCC)   Gram-negative UTI Severe sepsis secondary to above Patient clinically improving over interval.  Urine culture with GNR's, Proteus mirabilis isolated.  Pansensitive.  Sepsis physiology resolved.  Leukocytosis resolved.  Tolerating p.o. intake.  Stable for discharge home.  Received 3 days of IV Rocephin in house.  No further antibiotics indicated for uncomplicated cystitis.   Acute kidney injury, improved Likely prerenal due to hypovolemia from recurrent nausea and vomiting.  Unable to exclude ATN Improving on IV fluids Creatinine at or near baseline at time of discharge.  Okay to resume home medication regimen  Lab Results  Component Value Date   CREATININE 1.02 (H) 10/31/2021   BUN 32 (H) 10/31/2021   NA 134 (L) 10/31/2021   K 3.2  (L) 10/31/2021   CL 105 10/31/2021   CO2 20 (L) 10/31/2021   Not a lot of fluid Family is trying to force fluids    Lab Results  Component Value Date   WBC 13.5 (H) 10/31/2021   HGB 12.2 10/31/2021   HCT 38.2 10/31/2021   MCV 84.7 10/31/2021   PLT 524 (H) 10/31/2021      Esophageal thickening/acute esophagitis Appears improved.  Patient tolerating p.o. intake without any nausea or vomiting.  Continue PPI.  Offered GI consultation however patient reluctant at this time.  As patient is tolerating p.o. intake will defer for now.  Ambulatory referral to gastroenterology placed at time of discharge.  Daily PPI prescribed.  As needed Zofran prescribed.  Px protonix  Declines further evaluation     CT ABDOMEN PELVIS W CONTRAST  Result Date: 10/29/2021 CLINICAL DATA:  Nausea and vomiting x5 days. EXAM: CT ABDOMEN AND PELVIS WITH CONTRAST TECHNIQUE: Multidetector CT imaging of the abdomen and pelvis was performed using the standard protocol following bolus administration of intravenous contrast. RADIATION DOSE REDUCTION: This exam was performed according to the departmental dose-optimization program which includes automated exposure control, adjustment of the mA and/or kV according to patient size and/or use of iterative reconstruction technique. CONTRAST:  44m OMNIPAQUE IOHEXOL 300 MG/ML  SOLN COMPARISON:  None Available. FINDINGS: Lower chest: Mild linear atelectasis is seen within the left lung base. Hepatobiliary: No focal liver abnormality is seen. Status post cholecystectomy. The common bile duct measures 1.1 cm in diameter. Pancreas: Unremarkable.  No pancreatic ductal dilatation or surrounding inflammatory changes. Spleen: Normal in size without focal abnormality. Adrenals/Urinary Tract: Adrenal glands are unremarkable. Kidneys are normal in size, without renal calculi or hydronephrosis. Marked severity cortical thinning is seen along the anterior aspect of the mid to lower left kidney.  The urinary bladder is poorly distended and subsequently limited in evaluation. Anterior urinary bladder wall thickening is seen. Stomach/Bowel: There is marked severity thickening of the visualized portion of the distal esophagus which leads into a moderate sized hiatal hernia. A small amount of adjacent free fluid is noted. Appendix appears normal. No evidence of bowel wall thickening, distention, or inflammatory changes. Noninflamed diverticula are seen throughout the descending and sigmoid colon. Vascular/Lymphatic: Aortic atherosclerosis. No enlarged abdominal or pelvic lymph nodes. Reproductive: Uterus and bilateral adnexa are unremarkable. Other: No abdominal wall hernia or abnormality. No abdominopelvic ascites. Musculoskeletal: Degenerative changes are seen throughout the lumbar spine. IMPRESSION: 1. Thickening of the distal esophagus, as described above, which may represent sequelae associated with esophagitis. Further evaluation with endoscopy is recommended to exclude the presence of an underlying neoplastic process. 2. Colonic diverticulosis. 3. Marked severity cortical thinning along the anterior aspect of the mid to lower left kidney. 4. Aortic atherosclerosis. Aortic Atherosclerosis (ICD10-I70.0). Electronically Signed   By: Virgina Norfolk M.D.   On: 10/29/2021 00:46     DM2 Lab Results  Component Value Date   HGBA1C 8.0 (H) 10/29/2021   Glucotrol xl  Ssi in hospital   Sugar is low in am 70s During the day 200s-300s   Vital signs are ok   Uses desitin for skin  Does reposition frequently  Patient Active Problem List   Diagnosis Date Noted   Sacral ulcer ([place]) 11/04/2021   Sepsis due to gram-negative UTI ([place]) 10/29/2021   AKI (acute kidney injury) ([place]) 10/29/2021   Type 2 diabetes mellitus without complications ([place]) 77/41/2878   Acute esophagitis 10/29/2021   History of CVA (cerebrovascular accident) 06/04/2021   Hyperlipidemia associated with type 2 diabetes mellitus  ([place]) 06/04/2021   Fatigue 10/05/2020   Hypercalcemia 10/03/2020   Swallowing difficulty 10/03/2020   Type II or unspecified type diabetes mellitus without mention of complication, not stated as uncontrolled 06/24/2020   Other and unspecified hyperlipidemia 06/24/2020   Hypothyroidism 06/24/2020   Hypophosphatemia    Hypomagnesemia    Hypokalemia    B12 deficiency    Hyponatremia 06/21/2020   H/O sepsis 12/09/2019   CKD (chronic kidney disease) 11/23/2019   Leukocytosis 11/23/2019   Mobility impaired 10/13/2019   Incontinence of urine 10/12/2019   Grief reaction 03/27/2019   Elevated serum creatinine 06/13/2018   Dysuria 06/13/2018   Hearing loss 01/02/2018   Subclinical hypothyroidism 10/26/2017   Anxiety and depression 08/24/2016   Falls frequently 08/24/2016   Generalized weakness 08/24/2016   Palliative care status 05/16/2015   Breast calcifications on mammogram 03/01/2014   Encounter for Medicare annual wellness exam 05/30/2013   Breast calcification seen on mammogram 12/14/2012   Breast calcification, left 11/28/2012   History of falling 11/27/2012   Poor balance 11/27/2012   Colon cancer screening 05/10/2012   Osteoporosis 06/21/2007   Type 2 diabetes mellitus with diabetic neuropathy, unspecified ([place]) 09/20/2006   Essential hypertension 09/20/2006   Osteoarthritis 09/20/2006   Past Medical History:  Diagnosis Date   Depression    Diabetes mellitus    type II   Hyperlipidemia    Hypertension    Obesity    Osteoporosis    Past Surgical History:  Procedure Laterality Date   ANKLE SURGERY     left medial malleblus fracture   CHOLECYSTECTOMY     Social History   Tobacco Use   Smoking status: Never   Smokeless tobacco: Never  Vaping Use   Vaping Use: Never used  Substance Use Topics   Alcohol use: No    Alcohol/week: 0.0 standard drinks of alcohol   Drug use: No   Family History  Problem Relation Age of Onset   Diabetes Mother    Hypertension  Mother    Asthma Father    Hypertension Sister    Cancer Sister        lung   Asthma Brother    COPD Brother    Cancer Maternal Grandmother        breast    Asthma Brother    Diabetes Mellitus II Daughter    COPD Daughter    Allergies  Allergen Reactions   Alendronate Sodium     REACTION: heartburn   Current Outpatient Medications on File Prior to Visit  Medication Sig Dispense Refill   Accu-Chek Softclix Lancets lancets SMARTSIG:Topical 1-4 Times Daily     amLODipine (NORVASC) 10 MG tablet TAKE ONE TABLET BY MOUTH EVERY MORNING (Patient taking differently: Take 10 mg by mouth daily. TAKE ONE TABLET BY MOUTH EVERY MORNING) 90 tablet 1   blood glucose meter kit and supplies KIT Dispense based on patient and insurance preference. Use up to four times daily as directed. 1 each 0   buPROPion (WELLBUTRIN XL) 300 MG 24 hr tablet TAKE 1 TABLET(300 MG) BY MOUTH DAILY 90 tablet 1   FLUoxetine (PROZAC) 40 MG capsule TAKE ONE CAPSULE BY MOUTH EVERY MORNING (Patient taking differently: Take 40 mg by mouth daily.) 90 capsule 1   glipiZIDE (GLUCOTROL XL) 10 MG 24 hr tablet TAKE ONE TABLET BY MOUTH EVERY MORNING (Patient taking differently: Take 10 mg by mouth daily with breakfast.) 90 tablet 1   glucose blood (COOL BLOOD GLUCOSE TEST STRIPS) test strip To check blood glucose qd and prn for DM2 100 each 3   hydrochlorothiazide (HYDRODIURIL) 12.5 MG tablet Take 1 tablet (12.5 mg total) by mouth every morning. 90 tablet 1   Insulin Pen Needle 29G X 12MM MISC Use with insulin daily 90 each 0   loratadine (CLARITIN) 10 MG tablet Take 10 mg by mouth daily.     LORazepam (ATIVAN) 0.5 MG tablet Take 0.5 mg by mouth daily as needed (agitation).     ondansetron (ZOFRAN) 4 MG tablet Take 1 tablet (4 mg total) by mouth every 6 (six) hours as needed for nausea. 20 tablet 0   raloxifene (EVISTA) 60 MG tablet TAKE 1 TABLET(60 MG) BY MOUTH DAILY 90 tablet 1   traMADol (ULTRAM) 50 MG tablet Take 100 mg by mouth  every 8 (eight) hours as needed.     traZODone (DESYREL) 50 MG tablet Take 2 tablets (100 mg total) by mouth at bedtime as needed for sleep. 180 tablet 3   No current facility-administered medications on file prior to visit.   Review of Systems  Constitutional:  Positive for malaise/fatigue and weight loss. Negative for chills and fever.  HENT:  Negative for congestion, ear pain, sinus pain and sore throat.   Eyes:  Negative for blurred vision, discharge and redness.  Respiratory:  Negative for cough, shortness of breath and stridor.   Cardiovascular:  Negative for chest pain, palpitations and leg swelling.  Gastrointestinal:  Positive for nausea. Negative for abdominal  pain, blood in stool, constipation, diarrhea, melena and vomiting.  Genitourinary:  Negative for dysuria, frequency and hematuria.  Musculoskeletal:  Positive for back pain and joint pain. Negative for myalgias.  Skin:  Negative for rash.  Neurological:  Negative for dizziness and headaches.  Psychiatric/Behavioral:  Positive for depression.     Observations/Objective: Patient appears well, in no distress Weight is baseline  No facial swelling or asymmetry Voice is hoarse/mouth sounds dry  No obvious tremor or mobility impairment Moving neck and UEs normally Hard of hearing-daughter helps with history No cough or shortness of breath during interview  Fatigued and cognitively slow today  No skin changes on face or neck , no rash or pallor Affect is depressed (this is her baseline)   Assessment and Plan:  Problem List Items Addressed This Visit       Digestive   Acute esophagitis    Noted on CT Having trouble eating and stomach pain  Has not rec ppi (protonix) from [facility] yet  Sent short supply to local pharmacy to start asap  Pt adamantly declines any GI referral or outpt f/u  She does not want EGD She is in palliative care currently      Swallowing difficulty    Suspect linked to finding of  esophagitis on CT Needs to start her ppi asap        Endocrine   Type 2 diabetes mellitus with diabetic neuropathy, unspecified ([place])    Some low sugar in am  inst to hold glipizide if glucose is low  daughter monitors Plan f/u in 2-3 wk if able In palliative care         Musculoskeletal and Integument   Sacral ulcer ([place])    Per pt's caregiver on L buttock This often happens after hosp  They re position often Has home care  Will continue desitin  F/u here as planned         Genitourinary   AKI (acute kidney injury) ([place])    When in hosp with uro sepsis Reviewed hospital records, lab results and studies in detail  Improved at d/c Difficult to get her to drink fluids  Enc this  F/u 2-3 wk Aletha Halim do labs here         Other   Sepsis due to gram-negative UTI [facility]) - Primary    Reviewed hospital records, lab results and studies in detail  Proteus infection  Resolved with 3 d of rocephin IV in hospital  Clinically improved at home  Planning f/u in office        Follow Up Instructions: See AVS   I discussed the assessment and treatment plan with the patient. The patient was provided an opportunity to ask questions and all were answered. The patient agreed with the plan and demonstrated an understanding of the instructions.   The patient was advised to call back or seek an in-person evaluation if the symptoms worsen or if the condition fails to improve as anticipated.     Loura Pardon, MD

## 2021-11-17 ENCOUNTER — Encounter: Payer: Self-pay | Admitting: Family Medicine

## 2021-11-17 ENCOUNTER — Encounter: Payer: Self-pay | Admitting: Nurse Practitioner

## 2021-11-19 ENCOUNTER — Telehealth: Payer: Self-pay

## 2021-11-19 NOTE — Chronic Care Management (AMB) (Unsigned)
Chronic Care Management Pharmacy Assistant   Name: Amanda Benson  MRN: [id number] DOB: 1935/09/01   Reason for Encounter: Medication Adherence and Delivery Coordination     Recent office visits:  11/04/21-Marne Tower,MD(PCP)-Telemedicine- hospital f/u -start nystatin for thrush, protonix  asap for esophagus f/u 2-3 weeks  Recent consult visits:  None since last CCM contact   Hospital visits:  Medication Reconciliation was completed by comparing discharge summary, patient's EMR and Pharmacy list, and upon discussion with patient.  Admitted to the ED on 10/28/21 due to Sepsis. Discharge date was 10/31/21. [facility].    New?Medications Started at [facility] Discharge:?? -started Zofran  Protonix  Medications Discontinued at Hospital Discharge: -Stopped doxycycline   Medications that remain the same after Hospital Discharge:??  -All other medications will remain the same.    Medications: Outpatient Encounter Medications as of 11/19/2021  Medication Sig   Accu-Chek Softclix Lancets lancets SMARTSIG:Topical 1-4 Times Daily   amLODipine (NORVASC) 10 MG tablet TAKE ONE TABLET BY MOUTH EVERY MORNING (Patient taking differently: Take 10 mg by mouth daily. TAKE ONE TABLET BY MOUTH EVERY MORNING)   blood glucose meter kit and supplies KIT Dispense based on patient and insurance preference. Use up to four times daily as directed.   buPROPion (WELLBUTRIN XL) 300 MG 24 hr tablet TAKE 1 TABLET(300 MG) BY MOUTH DAILY   FLUoxetine (PROZAC) 40 MG capsule TAKE ONE CAPSULE BY MOUTH EVERY MORNING (Patient taking differently: Take 40 mg by mouth daily.)   glipiZIDE (GLUCOTROL XL) 10 MG 24 hr tablet TAKE ONE TABLET BY MOUTH EVERY MORNING (Patient taking differently: Take 10 mg by mouth daily with breakfast.)   glucose blood (COOL BLOOD GLUCOSE TEST STRIPS) test strip To check blood glucose qd and prn for DM2   hydrochlorothiazide (HYDRODIURIL) 12.5 MG tablet Take 1 tablet (12.5 mg  total) by mouth every morning.   Insulin Pen Needle 29G X 12MM MISC Use with insulin daily   loratadine (CLARITIN) 10 MG tablet Take 10 mg by mouth daily.   LORazepam (ATIVAN) 0.5 MG tablet Take 0.5 mg by mouth daily as needed (agitation).   nystatin (MYCOSTATIN) 100000 UNIT/ML suspension Take 5 mLs (500,000 Units total) by mouth in the morning, at noon, and at bedtime.   ondansetron (ZOFRAN) 4 MG tablet Take 1 tablet (4 mg total) by mouth every 6 (six) hours as needed for nausea.   pantoprazole (PROTONIX) 40 MG tablet Take 1 tablet (40 mg total) by mouth daily.   raloxifene (EVISTA) 60 MG tablet TAKE 1 TABLET(60 MG) BY MOUTH DAILY   traMADol (ULTRAM) 50 MG tablet Take 100 mg by mouth every 8 (eight) hours as needed.   traZODone (DESYREL) 50 MG tablet Take 2 tablets (100 mg total) by mouth at bedtime as needed for sleep.   No facility-administered encounter medications on file as of 11/19/2021.   BP Readings from Last 3 Encounters:  10/31/21 (!) 159/68  10/26/21 128/76  06/04/21 135/82    Lab Results  Component Value Date   HGBA1C 8.0 (H) 10/29/2021      {Upstream med sync 1:25020} {Upstream med sync 2:25021}   Last adherence delivery date:10/30/21      Patient is due for next adherence delivery on: 12/01/21  {Med Review:25223}  This delivery to include: Adherence Packaging  30 Days  Packs: Amlodipine 10mg - 1 tablet daily (1 breakfast) Glipizide 10mg  XL - 1 tablet daily (1 breakfast) Fluoxetine 40mg  - 1 capsule daily (1 breakfast) Hydrochlorothiazide 12.5 mg-  1 tablet (1 breakfast Raloxifene 60mg  -1 tablet daily (1 breakfast) Bupropion 300mg  24 hr-1 tablet daily (1 breakfast) Trazodone 50mg   take 2  tablet bedtime (2 bedtime) Pantoprazole 40mg   take 1 tablet daily (breakfast )  VIAL medications: Womens multivitamin- chew 2 tablets every morning  Accuchek test strips Softclix lancets Lorazepam 0.5mg - take 1 tablet every 6 hours as needed Claritin 10mg - take 1 tablet  daily as needed   Patient declined the following medications this month:    Any concerns about your medications? {yes/no:20286}  How often do you forget or accidentally miss a dose? {Missed doses:25554}  Do you use a pillbox? {yes/no:20286}  Is patient in packaging {yes/no:20286}  If yes  What is the date on your next pill pack?  Any concerns or issues with your packaging?   {refills needed:25320}  {Delivery date:25786}  Recent blood pressure readings are as follows:***   Recent blood glucose readings are as follows:*** Fasting:  Before Meals:  After Meals:  Bedtime:   Annual wellness visit in last year? {yes/no:20286} Most Recent BP reading:  If Diabetic: Most recent A1C reading: Last eye exam / retinopathy screening: Last diabetic foot exam:  Cycle dispensing form sent to {Medreview:27653} for review.   Charlene Brooke, CPP notified  Amanda Benson, Amanda Benson  [phone]

## 2021-11-29 ENCOUNTER — Emergency Department: Payer: Medicare HMO

## 2021-11-29 ENCOUNTER — Other Ambulatory Visit: Payer: Self-pay

## 2021-11-29 DIAGNOSIS — I4891 Unspecified atrial fibrillation: Secondary | ICD-10-CM | POA: Diagnosis present

## 2021-11-29 DIAGNOSIS — Z66 Do not resuscitate: Secondary | ICD-10-CM | POA: Diagnosis present

## 2021-11-29 DIAGNOSIS — R1310 Dysphagia, unspecified: Secondary | ICD-10-CM | POA: Diagnosis present

## 2021-11-29 DIAGNOSIS — D75839 Thrombocytosis, unspecified: Secondary | ICD-10-CM | POA: Diagnosis present

## 2021-11-29 DIAGNOSIS — I10 Essential (primary) hypertension: Secondary | ICD-10-CM | POA: Diagnosis not present

## 2021-11-29 DIAGNOSIS — K449 Diaphragmatic hernia without obstruction or gangrene: Secondary | ICD-10-CM | POA: Diagnosis present

## 2021-11-29 DIAGNOSIS — F32A Depression, unspecified: Secondary | ICD-10-CM | POA: Diagnosis present

## 2021-11-29 DIAGNOSIS — M810 Age-related osteoporosis without current pathological fracture: Secondary | ICD-10-CM | POA: Diagnosis present

## 2021-11-29 DIAGNOSIS — N1831 Chronic kidney disease, stage 3a: Secondary | ICD-10-CM | POA: Diagnosis present

## 2021-11-29 DIAGNOSIS — Z6824 Body mass index (BMI) 24.0-24.9, adult: Secondary | ICD-10-CM | POA: Diagnosis not present

## 2021-11-29 DIAGNOSIS — R0902 Hypoxemia: Secondary | ICD-10-CM | POA: Diagnosis present

## 2021-11-29 DIAGNOSIS — J9811 Atelectasis: Secondary | ICD-10-CM | POA: Diagnosis not present

## 2021-11-29 DIAGNOSIS — E8809 Other disorders of plasma-protein metabolism, not elsewhere classified: Secondary | ICD-10-CM | POA: Diagnosis present

## 2021-11-29 DIAGNOSIS — I517 Cardiomegaly: Secondary | ICD-10-CM | POA: Diagnosis not present

## 2021-11-29 DIAGNOSIS — E1140 Type 2 diabetes mellitus with diabetic neuropathy, unspecified: Secondary | ICD-10-CM | POA: Diagnosis present

## 2021-11-29 DIAGNOSIS — I493 Ventricular premature depolarization: Secondary | ICD-10-CM | POA: Diagnosis present

## 2021-11-29 DIAGNOSIS — L8990 Pressure ulcer of unspecified site, unspecified stage: Secondary | ICD-10-CM | POA: Diagnosis present

## 2021-11-29 DIAGNOSIS — R54 Age-related physical debility: Secondary | ICD-10-CM | POA: Diagnosis present

## 2021-11-29 DIAGNOSIS — K219 Gastro-esophageal reflux disease without esophagitis: Secondary | ICD-10-CM | POA: Diagnosis present

## 2021-11-29 DIAGNOSIS — J90 Pleural effusion, not elsewhere classified: Secondary | ICD-10-CM | POA: Diagnosis present

## 2021-11-29 DIAGNOSIS — Z515 Encounter for palliative care: Secondary | ICD-10-CM

## 2021-11-29 DIAGNOSIS — I129 Hypertensive chronic kidney disease with stage 1 through stage 4 chronic kidney disease, or unspecified chronic kidney disease: Secondary | ICD-10-CM | POA: Diagnosis present

## 2021-11-29 DIAGNOSIS — Z888 Allergy status to other drugs, medicaments and biological substances status: Secondary | ICD-10-CM

## 2021-11-29 DIAGNOSIS — E876 Hypokalemia: Secondary | ICD-10-CM | POA: Diagnosis present

## 2021-11-29 DIAGNOSIS — J690 Pneumonitis due to inhalation of food and vomit: Secondary | ICD-10-CM | POA: Diagnosis present

## 2021-11-29 DIAGNOSIS — Z20822 Contact with and (suspected) exposure to covid-19: Secondary | ICD-10-CM | POA: Diagnosis present

## 2021-11-29 DIAGNOSIS — E785 Hyperlipidemia, unspecified: Secondary | ICD-10-CM | POA: Diagnosis present

## 2021-11-29 DIAGNOSIS — Z7401 Bed confinement status: Secondary | ICD-10-CM

## 2021-11-29 DIAGNOSIS — R531 Weakness: Secondary | ICD-10-CM | POA: Diagnosis not present

## 2021-11-29 DIAGNOSIS — Z8249 Family history of ischemic heart disease and other diseases of the circulatory system: Secondary | ICD-10-CM

## 2021-11-29 DIAGNOSIS — E860 Dehydration: Secondary | ICD-10-CM | POA: Diagnosis not present

## 2021-11-29 DIAGNOSIS — J9601 Acute respiratory failure with hypoxia: Secondary | ICD-10-CM | POA: Diagnosis present

## 2021-11-29 DIAGNOSIS — R627 Adult failure to thrive: Secondary | ICD-10-CM | POA: Diagnosis present

## 2021-11-29 DIAGNOSIS — R0609 Other forms of dyspnea: Secondary | ICD-10-CM | POA: Diagnosis not present

## 2021-11-29 DIAGNOSIS — E43 Unspecified severe protein-calorie malnutrition: Secondary | ICD-10-CM | POA: Diagnosis present

## 2021-11-29 DIAGNOSIS — J189 Pneumonia, unspecified organism: Secondary | ICD-10-CM | POA: Diagnosis not present

## 2021-11-29 DIAGNOSIS — Z79899 Other long term (current) drug therapy: Secondary | ICD-10-CM

## 2021-11-29 DIAGNOSIS — Z833 Family history of diabetes mellitus: Secondary | ICD-10-CM

## 2021-11-29 DIAGNOSIS — Z7984 Long term (current) use of oral hypoglycemic drugs: Secondary | ICD-10-CM

## 2021-11-29 DIAGNOSIS — I700 Atherosclerosis of aorta: Secondary | ICD-10-CM | POA: Diagnosis not present

## 2021-11-29 LAB — COMPREHENSIVE METABOLIC PANEL
ALT: 11 U/L (ref 0–44)
AST: 22 U/L (ref 15–41)
Albumin: 2.3 g/dL — ABNORMAL LOW (ref 3.5–5.0)
Alkaline Phosphatase: 67 U/L (ref 38–126)
Anion gap: 9 (ref 5–15)
BUN: 34 mg/dL — ABNORMAL HIGH (ref 8–23)
CO2: 25 mmol/L (ref 22–32)
Calcium: 9 mg/dL (ref 8.9–10.3)
Chloride: 106 mmol/L (ref 98–111)
Creatinine, Ser: 1.15 mg/dL — ABNORMAL HIGH (ref 0.44–1.00)
GFR, Estimated: 47 mL/min — ABNORMAL LOW (ref >60)
Glucose, Bld: 181 mg/dL — ABNORMAL HIGH (ref 70–99)
Potassium: 3.7 mmol/L (ref 3.5–5.1)
Sodium: 140 mmol/L (ref 135–145)
Total Bilirubin: 0.7 mg/dL (ref 0.3–1.2)
Total Protein: 6.5 g/dL (ref 6.5–8.1)

## 2021-11-29 LAB — CBC
HCT: 35.8 % — ABNORMAL LOW (ref 36.0–46.0)
Hemoglobin: 10.8 g/dL — ABNORMAL LOW (ref 12.0–15.0)
MCH: 26 pg (ref 26.0–34.0)
MCHC: 30.2 g/dL (ref 30.0–36.0)
MCV: 86.1 fL (ref 80.0–100.0)
Platelets: 688 10*3/uL — ABNORMAL HIGH (ref 150–400)
RBC: 4.16 MIL/uL (ref 3.87–5.11)
RDW: 15.7 % — ABNORMAL HIGH (ref 11.5–15.5)
WBC: 11.2 10*3/uL — ABNORMAL HIGH (ref 4.0–10.5)
nRBC: 0 % (ref 0.0–0.2)

## 2021-11-29 LAB — TROPONIN I (HIGH SENSITIVITY): Troponin I (High Sensitivity): 40 ng/L — ABNORMAL HIGH (ref <18)

## 2021-11-29 NOTE — ED Triage Notes (Signed)
Pt states has felt weak, but does not know when it started. Pt states she thinks she needs oxygen but she is not sure. Pt denies known diarrhea, vomiting, ches tpain. Pt with ra pox of 83% in triage, placed on oxygen at 3lpm with rebound pox to 94%.

## 2021-11-30 ENCOUNTER — Inpatient Hospital Stay: Payer: Medicare HMO

## 2021-11-30 ENCOUNTER — Inpatient Hospital Stay
Admission: EM | Admit: 2021-11-30 | Discharge: 2021-12-04 | DRG: 177 | Disposition: E | Payer: Medicare HMO | Attending: Internal Medicine | Admitting: Internal Medicine

## 2021-11-30 ENCOUNTER — Encounter: Payer: Self-pay | Admitting: Family Medicine

## 2021-11-30 DIAGNOSIS — R0902 Hypoxemia: Secondary | ICD-10-CM

## 2021-11-30 DIAGNOSIS — J9811 Atelectasis: Secondary | ICD-10-CM | POA: Diagnosis not present

## 2021-11-30 DIAGNOSIS — E1140 Type 2 diabetes mellitus with diabetic neuropathy, unspecified: Secondary | ICD-10-CM | POA: Diagnosis present

## 2021-11-30 DIAGNOSIS — Z7401 Bed confinement status: Secondary | ICD-10-CM | POA: Diagnosis not present

## 2021-11-30 DIAGNOSIS — I10 Essential (primary) hypertension: Secondary | ICD-10-CM

## 2021-11-30 DIAGNOSIS — E43 Unspecified severe protein-calorie malnutrition: Secondary | ICD-10-CM | POA: Diagnosis present

## 2021-11-30 DIAGNOSIS — J189 Pneumonia, unspecified organism: Secondary | ICD-10-CM | POA: Diagnosis not present

## 2021-11-30 DIAGNOSIS — E876 Hypokalemia: Secondary | ICD-10-CM

## 2021-11-30 DIAGNOSIS — J690 Pneumonitis due to inhalation of food and vomit: Secondary | ICD-10-CM

## 2021-11-30 DIAGNOSIS — Z20822 Contact with and (suspected) exposure to covid-19: Secondary | ICD-10-CM | POA: Diagnosis present

## 2021-11-30 DIAGNOSIS — J9601 Acute respiratory failure with hypoxia: Secondary | ICD-10-CM | POA: Diagnosis present

## 2021-11-30 DIAGNOSIS — K219 Gastro-esophageal reflux disease without esophagitis: Secondary | ICD-10-CM | POA: Diagnosis present

## 2021-11-30 DIAGNOSIS — I4891 Unspecified atrial fibrillation: Secondary | ICD-10-CM | POA: Diagnosis present

## 2021-11-30 DIAGNOSIS — Z515 Encounter for palliative care: Secondary | ICD-10-CM | POA: Diagnosis not present

## 2021-11-30 DIAGNOSIS — E785 Hyperlipidemia, unspecified: Secondary | ICD-10-CM | POA: Diagnosis present

## 2021-11-30 DIAGNOSIS — F32A Depression, unspecified: Secondary | ICD-10-CM

## 2021-11-30 DIAGNOSIS — Z66 Do not resuscitate: Secondary | ICD-10-CM | POA: Diagnosis present

## 2021-11-30 DIAGNOSIS — I129 Hypertensive chronic kidney disease with stage 1 through stage 4 chronic kidney disease, or unspecified chronic kidney disease: Secondary | ICD-10-CM | POA: Diagnosis present

## 2021-11-30 DIAGNOSIS — N1831 Chronic kidney disease, stage 3a: Secondary | ICD-10-CM | POA: Diagnosis present

## 2021-11-30 DIAGNOSIS — R627 Adult failure to thrive: Secondary | ICD-10-CM | POA: Diagnosis present

## 2021-11-30 DIAGNOSIS — I493 Ventricular premature depolarization: Secondary | ICD-10-CM | POA: Diagnosis present

## 2021-11-30 DIAGNOSIS — D75839 Thrombocytosis, unspecified: Secondary | ICD-10-CM | POA: Diagnosis present

## 2021-11-30 DIAGNOSIS — Z6824 Body mass index (BMI) 24.0-24.9, adult: Secondary | ICD-10-CM | POA: Diagnosis not present

## 2021-11-30 DIAGNOSIS — J90 Pleural effusion, not elsewhere classified: Secondary | ICD-10-CM | POA: Diagnosis present

## 2021-11-30 DIAGNOSIS — M810 Age-related osteoporosis without current pathological fracture: Secondary | ICD-10-CM | POA: Diagnosis present

## 2021-11-30 DIAGNOSIS — R531 Weakness: Secondary | ICD-10-CM

## 2021-11-30 DIAGNOSIS — I700 Atherosclerosis of aorta: Secondary | ICD-10-CM | POA: Diagnosis not present

## 2021-11-30 DIAGNOSIS — I517 Cardiomegaly: Secondary | ICD-10-CM | POA: Diagnosis not present

## 2021-11-30 DIAGNOSIS — E8809 Other disorders of plasma-protein metabolism, not elsewhere classified: Secondary | ICD-10-CM | POA: Diagnosis present

## 2021-11-30 DIAGNOSIS — R1310 Dysphagia, unspecified: Secondary | ICD-10-CM | POA: Diagnosis present

## 2021-11-30 DIAGNOSIS — R0609 Other forms of dyspnea: Secondary | ICD-10-CM | POA: Diagnosis not present

## 2021-11-30 DIAGNOSIS — L8990 Pressure ulcer of unspecified site, unspecified stage: Secondary | ICD-10-CM | POA: Diagnosis present

## 2021-11-30 DIAGNOSIS — R54 Age-related physical debility: Secondary | ICD-10-CM | POA: Diagnosis present

## 2021-11-30 DIAGNOSIS — Z9889 Other specified postprocedural states: Secondary | ICD-10-CM

## 2021-11-30 LAB — URINALYSIS, ROUTINE W REFLEX MICROSCOPIC
Bacteria, UA: NONE SEEN
Bilirubin Urine: NEGATIVE
Glucose, UA: NEGATIVE mg/dL
Hgb urine dipstick: NEGATIVE
Ketones, ur: NEGATIVE mg/dL
Nitrite: NEGATIVE
Protein, ur: 100 mg/dL — AB
Specific Gravity, Urine: 1.034 — ABNORMAL HIGH (ref 1.005–1.030)
WBC, UA: >50 WBC/hpf — ABNORMAL HIGH (ref 0–5)
pH: 5 (ref 5.0–8.0)

## 2021-11-30 LAB — BASIC METABOLIC PANEL
Anion gap: 5 (ref 5–15)
BUN: 30 mg/dL — ABNORMAL HIGH (ref 8–23)
CO2: 24 mmol/L (ref 22–32)
Calcium: 7.3 mg/dL — ABNORMAL LOW (ref 8.9–10.3)
Chloride: 112 mmol/L — ABNORMAL HIGH (ref 98–111)
Creatinine, Ser: 1.02 mg/dL — ABNORMAL HIGH (ref 0.44–1.00)
GFR, Estimated: 54 mL/min — ABNORMAL LOW (ref >60)
Glucose, Bld: 144 mg/dL — ABNORMAL HIGH (ref 70–99)
Potassium: 3 mmol/L — ABNORMAL LOW (ref 3.5–5.1)
Sodium: 141 mmol/L (ref 135–145)

## 2021-11-30 LAB — BODY FLUID CELL COUNT WITH DIFFERENTIAL
Eos, Fluid: 0 %
Lymphs, Fluid: 89 %
Monocyte-Macrophage-Serous Fluid: 10 %
Neutrophil Count, Fluid: 1 %
Total Nucleated Cell Count, Fluid: 287 cu mm

## 2021-11-30 LAB — CBC
HCT: 29.4 % — ABNORMAL LOW (ref 36.0–46.0)
Hemoglobin: 8.7 g/dL — ABNORMAL LOW (ref 12.0–15.0)
MCH: 26.4 pg (ref 26.0–34.0)
MCHC: 29.6 g/dL — ABNORMAL LOW (ref 30.0–36.0)
MCV: 89.4 fL (ref 80.0–100.0)
Platelets: 502 10*3/uL — ABNORMAL HIGH (ref 150–400)
RBC: 3.29 MIL/uL — ABNORMAL LOW (ref 3.87–5.11)
RDW: 15.8 % — ABNORMAL HIGH (ref 11.5–15.5)
WBC: 9.6 10*3/uL (ref 4.0–10.5)
nRBC: 0.2 % (ref 0.0–0.2)

## 2021-11-30 LAB — SARS CORONAVIRUS 2 BY RT PCR: SARS Coronavirus 2 by RT PCR: NEGATIVE

## 2021-11-30 LAB — TROPONIN I (HIGH SENSITIVITY): Troponin I (High Sensitivity): 45 ng/L — ABNORMAL HIGH (ref <18)

## 2021-11-30 MED ORDER — GLIPIZIDE ER 10 MG PO TB24
10.0000 mg | ORAL_TABLET | Freq: Every day | ORAL | Status: DC
  Administered 2021-11-30 – 2021-12-01 (×2): 10 mg via ORAL
  Filled 2021-11-30 (×3): qty 1

## 2021-11-30 MED ORDER — SODIUM CHLORIDE 0.9 % IV SOLN
2.0000 g | Freq: Every day | INTRAVENOUS | Status: DC
  Administered 2021-11-30: 2 g via INTRAVENOUS
  Filled 2021-11-30: qty 20

## 2021-11-30 MED ORDER — LORAZEPAM 0.5 MG PO TABS
0.5000 mg | ORAL_TABLET | Freq: Every day | ORAL | Status: DC | PRN
  Administered 2021-12-01: 0.5 mg via ORAL
  Filled 2021-11-30: qty 1

## 2021-11-30 MED ORDER — ACETAMINOPHEN 650 MG RE SUPP: 650.0000 mg | Freq: Four times a day (QID) | RECTAL | Status: DC | PRN

## 2021-11-30 MED ORDER — TRAMADOL HCL 50 MG PO TABS
100.0000 mg | ORAL_TABLET | Freq: Three times a day (TID) | ORAL | Status: DC | PRN
  Administered 2021-12-01 (×2): 100 mg via ORAL
  Filled 2021-11-30 (×3): qty 2

## 2021-11-30 MED ORDER — ONDANSETRON HCL 4 MG PO TABS: 4.0000 mg | ORAL_TABLET | Freq: Four times a day (QID) | ORAL | Status: DC | PRN

## 2021-11-30 MED ORDER — PANTOPRAZOLE SODIUM 40 MG PO TBEC
40.0000 mg | DELAYED_RELEASE_TABLET | Freq: Every day | ORAL | Status: DC
  Administered 2021-11-30: 40 mg via ORAL
  Filled 2021-11-30 (×2): qty 1

## 2021-11-30 MED ORDER — ENOXAPARIN SODIUM 40 MG/0.4ML IJ SOSY
40.0000 mg | PREFILLED_SYRINGE | INTRAMUSCULAR | Status: DC
  Administered 2021-11-30 – 2021-12-01 (×2): 40 mg via SUBCUTANEOUS
  Filled 2021-11-30 (×2): qty 0.4

## 2021-11-30 MED ORDER — SODIUM CHLORIDE 0.9 % IV SOLN
3.0000 g | Freq: Four times a day (QID) | INTRAVENOUS | Status: DC
  Administered 2021-11-30 – 2021-12-02 (×8): 3 g via INTRAVENOUS
  Filled 2021-11-30 (×11): qty 8

## 2021-11-30 MED ORDER — ACETAMINOPHEN 325 MG PO TABS
650.0000 mg | ORAL_TABLET | Freq: Four times a day (QID) | ORAL | Status: DC | PRN
  Administered 2021-12-01: 650 mg via ORAL
  Filled 2021-11-30: qty 2

## 2021-11-30 MED ORDER — AMLODIPINE BESYLATE 10 MG PO TABS
10.0000 mg | ORAL_TABLET | Freq: Every day | ORAL | Status: DC
  Administered 2021-11-30 – 2021-12-01 (×2): 10 mg via ORAL
  Filled 2021-11-30: qty 2
  Filled 2021-11-30: qty 1

## 2021-11-30 MED ORDER — ONDANSETRON HCL 4 MG/2ML IJ SOLN: 4.0000 mg | Freq: Four times a day (QID) | INTRAMUSCULAR | Status: DC | PRN

## 2021-11-30 MED ORDER — BUPROPION HCL ER (XL) 150 MG PO TB24
300.0000 mg | ORAL_TABLET | Freq: Every day | ORAL | Status: DC
  Administered 2021-11-30 – 2021-12-01 (×2): 300 mg via ORAL
  Filled 2021-11-30 (×2): qty 2

## 2021-11-30 MED ORDER — POTASSIUM CHLORIDE CRYS ER 20 MEQ PO TBCR
40.0000 meq | EXTENDED_RELEASE_TABLET | Freq: Two times a day (BID) | ORAL | Status: AC
  Administered 2021-11-30 – 2021-12-01 (×4): 40 meq via ORAL
  Filled 2021-11-30 (×4): qty 2

## 2021-11-30 MED ORDER — POTASSIUM CHLORIDE IN NACL 20-0.9 MEQ/L-% IV SOLN
INTRAVENOUS | Status: DC
  Filled 2021-11-30 (×5): qty 1000

## 2021-11-30 MED ORDER — MAGNESIUM HYDROXIDE 400 MG/5ML PO SUSP: 30.0000 mL | Freq: Every day | ORAL | Status: DC | PRN

## 2021-11-30 MED ORDER — FLUOXETINE HCL 20 MG PO CAPS
40.0000 mg | ORAL_CAPSULE | Freq: Every day | ORAL | Status: DC
  Administered 2021-11-30 – 2021-12-01 (×2): 40 mg via ORAL
  Filled 2021-11-30 (×3): qty 2

## 2021-11-30 MED ORDER — TRAZODONE HCL 100 MG PO TABS: 100.0000 mg | ORAL_TABLET | Freq: Every evening | ORAL | Status: DC | PRN

## 2021-11-30 MED ORDER — LORATADINE 10 MG PO TABS
10.0000 mg | ORAL_TABLET | Freq: Every day | ORAL | Status: DC
  Administered 2021-11-30 – 2021-12-01 (×2): 10 mg via ORAL
  Filled 2021-11-30 (×2): qty 1

## 2021-11-30 MED ORDER — SODIUM CHLORIDE 0.9 % IV SOLN: 3.0000 g | Freq: Once | INTRAVENOUS | Status: DC

## 2021-11-30 MED ORDER — RALOXIFENE HCL 60 MG PO TABS
60.0000 mg | ORAL_TABLET | Freq: Every day | ORAL | Status: DC
  Administered 2021-11-30 – 2021-12-01 (×2): 60 mg via ORAL
  Filled 2021-11-30 (×4): qty 1

## 2021-11-30 MED ORDER — HYDROCHLOROTHIAZIDE 12.5 MG PO TABS
12.5000 mg | ORAL_TABLET | Freq: Every morning | ORAL | Status: DC
  Administered 2021-11-30 – 2021-12-01 (×2): 12.5 mg via ORAL
  Filled 2021-11-30 (×2): qty 1

## 2021-11-30 MED ORDER — SODIUM CHLORIDE 0.9 % IV SOLN
500.0000 mg | Freq: Every day | INTRAVENOUS | Status: DC
  Administered 2021-11-30: 500 mg via INTRAVENOUS
  Filled 2021-11-30: qty 5

## 2021-11-30 NOTE — Assessment & Plan Note (Signed)
-   The patient will be placed on supplement coverage with NovoLog. - We will continue her Glucotrol XL.

## 2021-11-30 NOTE — Assessment & Plan Note (Signed)
We will continue Wellbutrin XL and Prozac. 

## 2021-11-30 NOTE — Assessment & Plan Note (Addendum)
-   Potassium will be replaced and magnesium level will be checked. ?

## 2021-11-30 NOTE — H&P (Signed)
[place]   PATIENT NAME: Amanda Benson    MR#:  [id number]  DATE OF BIRTH:  06-11-1935  DATE OF ADMISSION:  11/18/2021  PRIMARY CARE PHYSICIAN: Tower, [name], MD   Patient is coming from: Home  REQUESTING/REFERRING PHYSICIAN: Vladimir Crofts, MD  CHIEF COMPLAINT:   Chief Complaint  Patient presents with   Weakness    HISTORY OF PRESENT ILLNESS:  Amanda Benson is a 86 y.o. Caucasian female with medical history significant for type 2 diabetes mellitus, hypertension, dyslipidemia, osteoporosis and depression, who presented to the ER with acute onset of generalized weakness and admitted to recent episodes of choking to solids more than fluids as well as cough and dyspnea without wheezing.  She denies any fever or chills.  She was noted to be hypoxic with pulse oximetry of 83% on room air.  No dysuria, oliguria or hematuria or flank pain.  No headache or dizziness or blurred vision.  No chest pain or palpitations.  Per her daughter she has been having dysphagia recently.  ED Course: When she the ER vital signs were within normal except for pulse extremity as mentioned above and that came up to 94% on 3 L of O2 by nasal cannula.  Labs revealed  BUN of 34 with creatinine 1.15 and blood glucose of 181 with albumin of 2.3.  High sensitive troponin was 40 and later 47 CBC showed mild cytosis of 11.2 and anemia with thrombocytosis  EKG as reviewed by me : EKG showed atrial fibrillation with controlled ventricular sponsor of 99 with PVCs and minimal voltage criteria for LVH with Q waves in anteroseptal leads and widened QRS Imaging: Portable chest ray showed mild right suprahilar infiltrates and opacification of the retrocardiac region and left lung base consistent with atelectasis and/or infiltrates with partially layering right pleural effusion.  The patient was given IV Unasyn.  She will be admitted to a medical telemetry bed for further evaluation and management. PAST MEDICAL HISTORY:    Past Medical History:  Diagnosis Date   Depression    Diabetes mellitus    type II   Hyperlipidemia    Hypertension    Obesity    Osteoporosis     PAST SURGICAL HISTORY:   Past Surgical History:  Procedure Laterality Date   ANKLE SURGERY     left medial malleblus fracture   CHOLECYSTECTOMY      SOCIAL HISTORY:   Social History   Tobacco Use   Smoking status: Never   Smokeless tobacco: Never  Substance Use Topics   Alcohol use: No    Alcohol/week: 0.0 standard drinks of alcohol    FAMILY HISTORY:   Family History  Problem Relation Age of Onset   Diabetes Mother    Hypertension Mother    Asthma Father    Hypertension Sister    Cancer Sister        lung   Asthma Brother    COPD Brother    Cancer Maternal Grandmother        breast    Asthma Brother    Diabetes Mellitus II Daughter    COPD Daughter     DRUG ALLERGIES:   Allergies  Allergen Reactions   Alendronate Sodium     REACTION: heartburn    REVIEW OF SYSTEMS:   ROS As per history of present illness. All pertinent systems were reviewed above. Constitutional, HEENT, cardiovascular, respiratory, GI, GU, musculoskeletal, neuro, psychiatric, endocrine, integumentary and hematologic systems were reviewed and  are otherwise negative/unremarkable except for positive findings mentioned above in the HPI.   MEDICATIONS AT HOME:   Prior to Admission medications   Medication Sig Start Date End Date Taking? Authorizing Provider  Accu-Chek Softclix Lancets lancets SMARTSIG:Topical 1-4 Times Daily 08/21/21   [provider]  amLODipine (NORVASC) 10 MG tablet TAKE ONE TABLET BY MOUTH EVERY MORNING Patient taking differently: Take 10 mg by mouth daily. TAKE ONE TABLET BY MOUTH EVERY MORNING 06/17/21   Tower, [name], MD  blood glucose meter kit and supplies KIT Dispense based on patient and insurance preference. Use up to four times daily as directed. 08/20/21   Tower, [name], MD  buPROPion  (WELLBUTRIN XL) 300 MG 24 hr tablet TAKE 1 TABLET(300 MG) BY MOUTH DAILY 07/30/21   Tower, [name], MD  FLUoxetine (PROZAC) 40 MG capsule TAKE ONE CAPSULE BY MOUTH EVERY MORNING Patient taking differently: Take 40 mg by mouth daily. 06/17/21   Tower, [name], MD  glipiZIDE (GLUCOTROL XL) 10 MG 24 hr tablet TAKE ONE TABLET BY MOUTH EVERY MORNING Patient taking differently: Take 10 mg by mouth daily with breakfast. 06/17/21   Tower, [name], MD  glucose blood (COOL BLOOD GLUCOSE TEST STRIPS) test strip To check blood glucose qd and prn for DM2 08/20/21   Tower, [name], MD  hydrochlorothiazide (HYDRODIURIL) 12.5 MG tablet Take 1 tablet (12.5 mg total) by mouth every morning. 07/30/21   Tower, [name], MD  Insulin Pen Needle 29G X 12MM MISC Use with insulin daily 01/22/20   Tower, [name], MD  loratadine (CLARITIN) 10 MG tablet Take 10 mg by mouth daily. 07/28/21   [provider]  LORazepam (ATIVAN) 0.5 MG tablet Take 0.5 mg by mouth daily as needed (agitation).    [provider]  nystatin (MYCOSTATIN) 100000 UNIT/ML suspension Take 5 mLs (500,000 Units total) by mouth in the morning, at noon, and at bedtime. 11/04/21   Tower, [name], MD  ondansetron (ZOFRAN) 4 MG tablet Take 1 tablet (4 mg total) by mouth every 6 (six) hours as needed for nausea. 10/31/21   Sidney Ace, MD  pantoprazole (PROTONIX) 40 MG tablet Take 1 tablet (40 mg total) by mouth daily. 11/04/21 01/03/22  Tower, [name], MD  raloxifene (EVISTA) 60 MG tablet TAKE 1 TABLET(60 MG) BY MOUTH DAILY 07/30/21   Tower, [name], MD  traMADol (ULTRAM) 50 MG tablet Take 100 mg by mouth every 8 (eight) hours as needed. 06/26/21   [provider]  traZODone (DESYREL) 50 MG tablet Take 2 tablets (100 mg total) by mouth at bedtime as needed for sleep. 07/30/21   Tower, [name], MD      VITAL SIGNS:  Blood pressure 130/77, pulse 95, temperature 98 F (36.7 C), temperature source Oral, resp. rate (!) 21, height _0  (1.6 m),  weight 63.5 kg, SpO2 98 %.  PHYSICAL EXAMINATION:  Physical Exam  GENERAL:  86 y.o.-year-old Caucasian female patient lying in the bed with no acute distress.  EYES: Pupils equal, round, reactive to light and accommodation. No scleral icterus. Extraocular muscles intact.  HEENT: Head atraumatic, normocephalic. Oropharynx and nasopharynx clear.  NECK:  Supple, no jugular venous distention. No thyroid enlargement, no tenderness.  LUNGS: Right upper lung zone and left basal crackles with breath sounds.. No use of accessory muscles of respiration.  CARDIOVASCULAR: Regular rate and rhythm, S1, S2 normal. No murmurs, rubs, or gallops.  ABDOMEN: Soft, nondistended, nontender. Bowel sounds present. No organomegaly  or mass.  EXTREMITIES: No pedal edema, cyanosis, or clubbing.  NEUROLOGIC: Cranial nerves II through XII are intact. Muscle strength 5/5 in all extremities. Sensation intact. Gait not checked.  PSYCHIATRIC: The patient is alert and oriented x 3.  Normal affect and good eye contact. SKIN: No obvious rash, lesion, or ulcer.   LABORATORY PANEL:   CBC Recent Labs  Lab 11/06/2021 0341  WBC 9.6  HGB 8.7*  HCT 29.4*  PLT 502*   ------------------------------------------------------------------------------------------------------------------  Chemistries  Recent Labs  Lab 11/29/21 1948 11/16/2021 0341  NA 140 141  K 3.7 3.0*  CL 106 112*  CO2 25 24  GLUCOSE 181* 144*  BUN 34* 30*  CREATININE 1.15* 1.02*  CALCIUM 9.0 7.3*  AST 22  --   ALT 11  --   ALKPHOS 67  --   BILITOT 0.7  --    ------------------------------------------------------------------------------------------------------------------  Cardiac Enzymes No results for input(s): "TROPONINI" in the last 168 hours. ------------------------------------------------------------------------------------------------------------------  RADIOLOGY:  DG Chest 1 View  Result Date: 11/29/2021 CLINICAL DATA:  Weakness. EXAM:  CHEST  1 VIEW COMPARISON:  October 21, 2020 FINDINGS: The cardiac silhouette is mildly enlarged. Marked severity calcification of the aortic arch is noted. Mild infiltrate is seen along the suprahilar region on the right. Opacification of the retrocardiac region of the left lung base is seen. A partially layering right pleural effusion is noted. No pneumothorax is seen. Multilevel degenerative changes seen throughout the thoracic spine. IMPRESSION: 1. Mild right suprahilar infiltrate. 2. Opacification of the retrocardiac region of the left lung base consistent with atelectasis and/or infiltrate. 3. Partially layering right pleural effusion. Electronically Signed   By: Virgina Norfolk M.D.   On: 11/29/2021 22:13      IMPRESSION AND PLAN:  Assessment and Plan: * Aspiration pneumonia ([place]) - The patient will be admitted to a medical telemetry bed. - We will continue therapy with IV Unasyn and Zithromax. - Mucolytic therapy will be provided. - Bronchodilator therapy will be provided. - Speech therapy consult will be obtained. - Blood cultures will be followed.  Acute respiratory failure with hypoxia ([facility]) --This is clearly secondary to #1. - O2 protocol will be followed.  Hypokalemia Potassium will be replaced and magnesium level will be checked  GERD without esophagitis We will continue PPI therapy.  Depression - We will continue Wellbutrin XL and Prozac.  Essential hypertension - We will continue her antihypertensives.  Type 2 diabetes mellitus with diabetic neuropathy, unspecified ([place]) - The patient will be placed on supplement coverage with NovoLog. - We will continue her Glucotrol XL.     DVT prophylaxis: Lovenox.  Advanced Care Planning:  Code Status: full code.  Family Communication:  The plan of care was discussed in details with the patient (and family). I answered all questions. The patient agreed to proceed with the above mentioned plan. Further management will depend  upon hospital course. Disposition Plan: Back to previous home environment Consults called: none.  All the records are reviewed and case discussed with ED provider.  Status is: Inpatient    At the time of the admission, it appears that the appropriate admission status for this patient is inpatient.  This is judged to be reasonable and necessary in order to provide the required intensity of service to ensure the patient's safety given the presenting symptoms, physical exam findings and initial radiographic and laboratory data in the context of comorbid conditions.  The patient requires inpatient status due to high intensity of service, high risk  of further deterioration and high frequency of surveillance required.  I certify that at the time of admission, it is my clinical judgment that the patient will require inpatient hospital care extending more than 2 midnights.                            Dispo: The patient is from: Home              Anticipated d/c is to: Home              Patient currently is not medically stable to d/c.              Difficult to place patient: No  Christel Mormon M.D on 11/24/2021 at 6:00 AM  [facility]   From 7 PM-7 AM, contact night-coverage [email or web address]  CC: Primary care physician; Tower, [name], MD

## 2021-11-30 NOTE — ED Notes (Addendum)
No purple man showing at  this time. This RN spoke with Amanda Benson and she states they are ready for the pt to come up. Not sure why purple man is not showing. Christina RN transporting pt at this time.

## 2021-11-30 NOTE — Progress Notes (Signed)
PT Cancellation Note  Patient Details Name: Amanda Benson MRN: [id number] DOB: [birth year]   Cancelled Treatment:    Reason Eval/Treat Not Completed: Patient at procedure or test/unavailable (Pt OTF for proceudre. WIll attempt evaluation again at later date/time.)  4:17 PM, 12-22-21 Rosamaria Lints, PT, DPT Physical Therapist - [facility]  [phone] (ASCOM)    [place] C 12-22-21, 4:17 PM

## 2021-11-30 NOTE — Assessment & Plan Note (Addendum)
-   The patient will be admitted to a medical telemetry bed. - We will continue therapy with IV Unasyn and Zithromax. - Mucolytic therapy will be provided. - Bronchodilator therapy will be provided. - Speech therapy consult will be obtained. - Blood cultures will be followed.

## 2021-11-30 NOTE — Assessment & Plan Note (Signed)
-   This is clearly secondary to #1. - O2 protocol will be followed.  

## 2021-11-30 NOTE — Assessment & Plan Note (Signed)
-   We will continue her antihypertensives. 

## 2021-11-30 NOTE — Plan of Care (Signed)
  Problem: Activity: Goal: Ability to tolerate increased activity will improve Outcome: Progressing   Problem: Clinical Measurements: Goal: Ability to maintain a body temperature in the normal range will improve Outcome: Progressing   Problem: Respiratory: Goal: Ability to maintain adequate ventilation will improve Outcome: Progressing Goal: Ability to maintain a clear airway will improve Outcome: Progressing   

## 2021-11-30 NOTE — Evaluation (Addendum)
Clinical/Bedside Swallow Evaluation Patient Details  Name: Amanda Benson MRN: [id number] Date of Birth: 1935/11/15  Today's Date: 11/18/2021 Time: SLP Start Time (ACUTE ONLY): 1155 SLP Stop Time (ACUTE ONLY): 1250 SLP Time Calculation (min) (ACUTE ONLY): 55 min  Past Medical History:  Past Medical History:  Diagnosis Date   Depression    Diabetes mellitus    type II   Hyperlipidemia    Hypertension    Obesity    Osteoporosis    Past Surgical History:  Past Surgical History:  Procedure Laterality Date   ANKLE SURGERY     left medial malleblus fracture   CHOLECYSTECTOMY     HPI:  Pt  is a 86 y.o. Caucasian female with medical history significant for type 2 diabetes mellitus, hypertension, dyslipidemia, osteoporosis and depression, who presented to the ER with acute onset of generalized weakness and admitted to recent episodes of choking to solids more than fluids as well as cough and dyspnea without wheezing.  She denies any fever or chills.  She was noted to be hypoxic with pulse oximetry of 83% on room air.  No dysuria, oliguria or hematuria or flank pain.  No headache or dizziness or blurred vision.  No chest pain or palpitations.  Per her daughter she has been having dysphagia recently -- difficulty w/ SOLID FOODS; Vomiting episdoe.   PER IMAGING: "Moderate bilateral pleural effusions are noted with adjacent  atelectasis of both lower lobes.   Right upper lobe airspace opacity is noted concerning for pneumonia.";  CTs of ABD (3 since 2021): "marked severity thickening of the visualized portion of the distal esophagus which leads into a moderate-large sized hiatal hernia.".    Assessment / Plan / Recommendation  Clinical Impression  Pt seen for BSE this morning. No family present. Pt A/O x3; engaged easily w/ this SLP -- HOH. [place] O2 support- 2L; afebrile, WBC not elevated. This pt has been seen by ST services previously w/ education on Esophageal dysmotility - recommended a mech soft  diet w/ chopped meats/foods.    OF NOTE: Recently D/c'd from Hospice services due to stability. Per Palliative Care notes in 10/2021, patient/daughter endorsed poor appetite/hydration w/ nausea/vomiting x1. There was concern for aspiration pneumonia about 1 week ago per Palliative Care notes -- suspect aspiration of REFLUX vomit per pt's description and medical status including CT Abds last month, 2022 and 2021: "marked severity thickening of the visualized portion of the distal Esophagus which leads into a moderate-large sized Hiatal Hernia."    Pt appears to present w/ adequate oropharyngeal phase swallowing function w/ No overt oropharyngeal phase dysphagia appreciated during oral intake of trials, lunch meal; No neuromuscular swallowing deficits appreciated. Pt appears at reduced risk for aspiration from an oropharyngeal phase standpoint following general aspiration precautions. HOWEVER, pt has a baseline presentation of Esophageal Dysmotility and "moderate-large sized Hiatal Hernia". ANY Dysmotility or Regurgitation of REFLUX/vomit material can increase risk for aspiration of the REFLUX material during Retrograde flow thus impact Pulmonary status. Pt described issues of globus and belching during oral intake.    Pt sat upright in bed and consumed several trials of thin liquids Via Cup/Straw, purees, and soft/chopped solid foods w/ No immediate, overt clinical s/s of aspiration noted; clear vocal quality b/t trials, no decline in pulmonary status, no multiple swallows noted post initial pharyngeal swallow, no decline in O2 sats(98%). Oral phase appeared [facility] for bolus management and timely A-P transfer/clearing of purees and thin liquids -- increased oral phase time for mashing  and gumming of increased textured foods even when cut into small pieces. Mastication was hampered by Edentulous status. Pt stated she does Not wear Dentures. OM exam was [facility] for oral clearing; lingual/labial movements. No unilateral  weakness. Speech clear.    Recommend a more mech soft diet w/ chopped/minced meats w/ gravies to moisten foods well; thin liquids. General aspiration precautions. Rest Breaks during meals/oral intake to allow for Esophageal clearing. REFLUX precautions recommended to lessen chance for Regurgitation -- HOB elevated for 30-45 mins post meals and at night when sleeping. Dietician f/u for drink supplement as needed. Pills WHOLE in Puree(this was completed during session as well w/ NSG leaving Cut Potassium tablets w/ pt and SLP). Pt demonstrated appropriate swallowing WHOLE in Puree.   Recommend pt f/u w/ GI for assessment/management of Esophageal phase Dysmotility and Education on such; tx as indicated. Discussion and handouts given on REFLUX. MD to reconsult ST services if any new needs while admitted. NSG updated. Pt appreciative of Education information. SLP Visit Diagnosis: Dysphagia, unspecified (R13.10) (ESOPHAGEAL PHASE dysmotility)    Aspiration Risk  Mild aspiration risk;Risk for inadequate nutrition/hydration (of REFLUX VOMIT)    Diet Recommendation   mech soft diet w/ chopped/minced meats w/ gravies to moisten foods well; thin liquids. General aspiration precautions. Rest Breaks during meals/oral intake to allow for Esophageal clearing. REFLUX precautions recommended to lessen chance for Regurgitation -- HOB elevated for 30-45 mins post meals and at night when sleeping.   Medication Administration: Whole meds with puree    Other  Recommendations Recommended Consults: Consider GI evaluation;Consider esophageal assessment (Dietician f/u; Palliative Care f/u) Oral Care Recommendations: Oral care BID;Oral care before and after PO;Patient independent with oral care Other Recommendations:  (n/a)    Recommendations for follow up therapy are one component of a multi-disciplinary discharge planning process, led by the attending physician.  Recommendations may be updated based on patient status,  additional functional criteria and insurance authorization.  Follow up Recommendations No SLP follow up -- no oropharyngeal phase deficits; Esophageal phase Dysmotility     Assistance Recommended at Discharge Set up Supervision/Assistance (as needed for positioning and REFLUX precs.)  Functional Status Assessment Patient has had a recent decline in their functional status and/or demonstrates limited ability to make significant improvements in function in a reasonable and predictable amount of time  Frequency and Duration  (n/a)   (n/a)       Prognosis Prognosis for Safe Diet Advancement: Fair Barriers to Reach Goals: Time post onset;Severity of deficits Barriers/Prognosis Comment: Mod-Large Hiatal hernia; Esophageal phase dysmotility; Edentulous      Swallow Study   General Date of Onset: 11/29/21 HPI: Pt  is a 86 y.o. Caucasian female with medical history significant for type 2 diabetes mellitus, hypertension, dyslipidemia, osteoporosis and depression, who presented to the ER with acute onset of generalized weakness and admitted to recent episodes of choking to solids more than fluids as well as cough and dyspnea without wheezing.  She denies any fever or chills.  She was noted to be hypoxic with pulse oximetry of 83% on room air.  No dysuria, oliguria or hematuria or flank pain.  No headache or dizziness or blurred vision.  No chest pain or palpitations.  Per her daughter she has been having dysphagia recently -- difficulty w/ SOLID FOODS; Vomiting episdoe.   PER IMAGING: "Moderate bilateral pleural effusions are noted with adjacent  atelectasis of both lower lobes.   Right upper lobe airspace opacity is noted concerning for pneumonia.";  CTs of ABD (3 since 2021): "marked severity thickening of the visualized portion of the distal esophagus which leads into a moderate-large sized hiatal hernia.". Type of Study: Bedside Swallow Evaluation Previous Swallow Assessment: 2022 - mech soft w/  chopped meats Diet Prior to this Study: Regular;Thin liquids Temperature Spikes Noted: No (wbc 9.6) Respiratory Status: Nasal cannula (2L) History of Recent Intubation: No Behavior/Cognition: Alert;Cooperative;Pleasant mood;Distractible;Requires cueing (HOH) Oral Cavity Assessment: Within Functional Limits Oral Care Completed by SLP: Yes Oral Cavity - Dentition: Edentulous (does not wear dentures) Vision: Functional for self-feeding Self-Feeding Abilities: Able to feed self;Needs set up Patient Positioning: Upright in bed (needed support w/ Positioning) Baseline Vocal Quality: Normal Volitional Cough: Strong Volitional Swallow: Able to elicit    Oral/Motor/Sensory Function Overall Oral Motor/Sensory Function: Within functional limits   Ice Chips Ice chips: Within functional limits Presentation: Spoon (fed; 2 trials)   Thin Liquid Thin Liquid: Within functional limits Presentation: Cup;Self Fed;Straw (~6ozs total) Other Comments: water, juice    Nectar Thick Nectar Thick Liquid: Not tested   Honey Thick Honey Thick Liquid: Not tested   Puree Puree: Within functional limits Presentation: Self Fed;Spoon (~3 ozs)   Solid     Solid: Impaired (chopped, moistened well) Presentation: Self Fed;Spoon (6 trials of meatloaf) Oral Phase Impairments: Impaired mastication (Edentulous -- extra time needed to gum the foods) Pharyngeal Phase Impairments:  (none)        Jerilynn Som, MS, CCC-SLP Speech Language Pathologist Rehab Services; [facility] [phone] (ascom) Cheryle Dark 11/21/2021,12:57 PM

## 2021-11-30 NOTE — Assessment & Plan Note (Signed)
-   We will continue PPI therapy 

## 2021-11-30 NOTE — Progress Notes (Signed)
Patient seen and examined.  Admitted early morning hours by nighttime hospitalist.  See assessment plan and documentation done by Dr. Arville Care from earlier morning. In brief, 86 year old with multiple medical issues including type 2 diabetes, essential hypertension, dyslipidemia, depression and mobility issues, chronic dysphagia and hiatal hernia with reflux presented with cough, weakness, shortness of breath without wheezing.  In the emergency room on room air.  Chest x-ray with right suprahilar infiltrates, bilateral pleural effusions.  Does have history of episodic choking on food.  Aspiration pneumonia, likely reflux with GERD and hiatal hernia. CT scan was ordered and reviewed, shows right middle lobe pneumonia, continue Unasyn.  Discontinue azithromycin. Speech therapy to see. reflux precautions, PPI twice daily. Diagnostic thoracentesis 2D echocardiogram to rule out congestive heart failure given recent worsening dyspnea and bilateral pleural effusions. Discontinue maintenance IV fluids once able to take by mouth. Potassium supplementation.  Called to update patient's daughter, unable to pick up the phone. Needs admission and needs to stay inpatient because of significant symptoms.   Same-day admit.  No charge visit.

## 2021-11-30 NOTE — ED Provider Notes (Signed)
[facility] Provider Note    Event Date/Time   First MD Initiated Contact with Patient 11/05/2021 0159     (approximate)   History   Weakness   HPI  Amanda Benson is a 86 y.o. female who presents to the ED for evaluation of Weakness   I reviewed medical DC summary from 7/29.  Patient had not been admitted for 3 days for IV antibiotics in the setting of a UTI.  Met sepsis criteria.  Patient presents to the ED, with her daughter with whom she lives, for evaluation of increasing weakness, cough and difficulty swallowing.  Daughter reports that patient seems to be worsening in terms of her dysphagia and difficulty getting solids down, concerns for choking fits and coughing while eating.    Physical Exam   Triage Vital Signs: ED Triage Vitals [11/29/21 1938]  Enc Vitals Group     BP 130/71     Pulse Rate 98     Resp 16     Temp 98 F (36.7 C)     Temp Source Oral     SpO2 94 %     Weight 140 lb (63.5 kg)     Height _0  (1.6 m)     Head Circumference      Peak Flow      Pain Score 0     Pain Loc      Pain Edu?      Excl. in [place]?     Most recent vital signs: Vitals:   11/22/2021 0400 11/17/2021 0446  BP: 130/77   Pulse: 95   Resp: (!) 21   Temp:  98 F (36.7 C)  SpO2: 98%     General: Awake, no distress.  CV:  Good peripheral perfusion.  Resp:  Normal effort.  Abd:  No distention.  MSK:  No deformity noted.  Neuro:  No focal deficits appreciated. Other:     ED Results / Procedures / Treatments   Labs (all labs ordered are listed, but only abnormal results are displayed) Labs Reviewed  CBC - Abnormal; Notable for the following components:      Result Value   WBC 11.2 (*)    Hemoglobin 10.8 (*)    HCT 35.8 (*)    RDW 15.7 (*)    Platelets 688 (*)    All other components within normal limits  COMPREHENSIVE METABOLIC PANEL - Abnormal; Notable for the following components:   Glucose, Bld 181 (*)    BUN 34 (*)    Creatinine, Ser  1.15 (*)    Albumin 2.3 (*)    GFR, Estimated 47 (*)    All other components within normal limits  BASIC METABOLIC PANEL - Abnormal; Notable for the following components:   Potassium 3.0 (*)    Chloride 112 (*)    Glucose, Bld 144 (*)    BUN 30 (*)    Creatinine, Ser 1.02 (*)    Calcium 7.3 (*)    GFR, Estimated 54 (*)    All other components within normal limits  CBC - Abnormal; Notable for the following components:   RBC 3.29 (*)    Hemoglobin 8.7 (*)    HCT 29.4 (*)    MCHC 29.6 (*)    RDW 15.8 (*)    Platelets 502 (*)    All other components within normal limits  TROPONIN I (HIGH SENSITIVITY) - Abnormal; Notable for the following components:   Troponin I (High Sensitivity) 40 (*)  All other components within normal limits  TROPONIN I (HIGH SENSITIVITY) - Abnormal; Notable for the following components:   Troponin I (High Sensitivity) 45 (*)    All other components within normal limits  SARS CORONAVIRUS 2 BY RT PCR  URINALYSIS, ROUTINE W REFLEX MICROSCOPIC    EKG A-fib with a rate of 99 bpm, left bundle morphology, no STEMI by Sgarbossa criteria.  RADIOLOGY 1 view CXR interpreted by me with left basilar and right suprahilar infiltration  Official radiology report(s): DG Chest 1 View  Result Date: 11/29/2021 CLINICAL DATA:  Weakness. EXAM: CHEST  1 VIEW COMPARISON:  October 21, 2020 FINDINGS: The cardiac silhouette is mildly enlarged. Marked severity calcification of the aortic arch is noted. Mild infiltrate is seen along the suprahilar region on the right. Opacification of the retrocardiac region of the left lung base is seen. A partially layering right pleural effusion is noted. No pneumothorax is seen. Multilevel degenerative changes seen throughout the thoracic spine. IMPRESSION: 1. Mild right suprahilar infiltrate. 2. Opacification of the retrocardiac region of the left lung base consistent with atelectasis and/or infiltrate. 3. Partially layering right pleural effusion.  Electronically Signed   By: Virgina Norfolk M.D.   On: 11/29/2021 22:13    PROCEDURES and INTERVENTIONS:  .1-3 Lead EKG Interpretation  Performed by: Vladimir Crofts, MD Authorized by: Vladimir Crofts, MD     Interpretation: normal     ECG rate:  91   ECG rate assessment: normal     Rhythm: sinus rhythm     Ectopy: none     Conduction: normal   .Critical Care  Performed by: Vladimir Crofts, MD Authorized by: Vladimir Crofts, MD   Critical care provider statement:    Critical care time (minutes):  30   Critical care time was exclusive of:  Separately billable procedures and treating other patients   Critical care was necessary to treat or prevent imminent or life-threatening deterioration of the following conditions:  Respiratory failure   Critical care was time spent personally by me on the following activities:  Development of treatment plan with patient or surrogate, discussions with consultants, evaluation of patient's response to treatment, examination of patient, ordering and review of laboratory studies, ordering and review of radiographic studies, ordering and performing treatments and interventions, pulse oximetry, re-evaluation of patient's condition and review of old charts   Medications  traMADol (ULTRAM) tablet 100 mg (has no administration in time range)  amLODipine (NORVASC) tablet 10 mg (has no administration in time range)  hydrochlorothiazide (HYDRODIURIL) tablet 12.5 mg (has no administration in time range)  buPROPion (WELLBUTRIN XL) 24 hr tablet 300 mg (has no administration in time range)  FLUoxetine (PROZAC) capsule 40 mg (has no administration in time range)  LORazepam (ATIVAN) tablet 0.5 mg (has no administration in time range)  traZODone (DESYREL) tablet 100 mg (has no administration in time range)  glipiZIDE (GLUCOTROL XL) 24 hr tablet 10 mg (has no administration in time range)  raloxifene (EVISTA) tablet 60 mg (has no administration in time range)  pantoprazole  (PROTONIX) EC tablet 40 mg (has no administration in time range)  loratadine (CLARITIN) tablet 10 mg (has no administration in time range)  enoxaparin (LOVENOX) injection 40 mg (has no administration in time range)  cefTRIAXone (ROCEPHIN) 2 g in sodium chloride 0.9 % 100 mL IVPB (0 g Intravenous Stopped 11/14/2021 0405)  azithromycin (ZITHROMAX) 500 mg in sodium chloride 0.9 % 250 mL IVPB (500 mg Intravenous New Bag/Given 11/07/2021 0442)  0.9 % NaCl  with KCl 20 mEq/ L  infusion ( Intravenous New Bag/Given 11/21/2021 0443)  acetaminophen (TYLENOL) tablet 650 mg (has no administration in time range)    Or  acetaminophen (TYLENOL) suppository 650 mg (has no administration in time range)  magnesium hydroxide (MILK OF MAGNESIA) suspension 30 mL (has no administration in time range)  ondansetron (ZOFRAN) tablet 4 mg (has no administration in time range)    Or  ondansetron (ZOFRAN) injection 4 mg (has no administration in time range)     IMPRESSION / MDM / ASSESSMENT AND PLAN / ED COURSE  I reviewed the triage vital signs and the nursing notes.  Differential diagnosis includes, but is not limited to, dehydration, electrolyte derangement, AKI, pneumonia, aspiration, COVID-19, symptomatic anemia  {Patient presents with symptoms of an acute illness or injury that is potentially life-threatening.  86 year old woman presents to the ED with stigmata of aspiration pneumonia with new hypoxia requiring medical admission.  She is requiring nasal cannula, which is new for the patient.  No SIRS criteria or signs of sepsis despite this.  Metabolic panel with CKD around baseline and no leukocytosis.  Normocytic anemia is noted and we will continue to monitor for signs of bleeding.  No reports of this.  CXR with infiltration concerning for pneumonia.  In conjunction with her choking spells that the daughter reports I am concerned about aspiration pneumonia.  We start the patient on Unasyn and consult with medicine for  admission.      FINAL CLINICAL IMPRESSION(S) / ED DIAGNOSES   Final diagnoses:  Aspiration pneumonia of right upper lobe, unspecified aspiration pneumonia type ([place])  Generalized weakness  Hypoxemia     Rx / DC Orders   ED Discharge Orders     None        Note:  This document was prepared using Dragon voice recognition software and may include unintentional dictation errors.   Vladimir Crofts, MD 11/20/2021 [phone]

## 2021-12-01 ENCOUNTER — Other Ambulatory Visit: Payer: Medicare HMO | Admitting: Nurse Practitioner

## 2021-12-01 ENCOUNTER — Inpatient Hospital Stay (HOSPITAL_COMMUNITY)
Admit: 2021-12-01 | Discharge: 2021-12-01 | Disposition: A | Discharge status: Home / Self Care | Payer: Medicare HMO | Attending: Internal Medicine | Admitting: Internal Medicine

## 2021-12-01 DIAGNOSIS — R0609 Other forms of dyspnea: Secondary | ICD-10-CM | POA: Diagnosis not present

## 2021-12-01 DIAGNOSIS — J690 Pneumonitis due to inhalation of food and vomit: Secondary | ICD-10-CM | POA: Diagnosis not present

## 2021-12-01 DIAGNOSIS — L8990 Pressure ulcer of unspecified site, unspecified stage: Secondary | ICD-10-CM | POA: Diagnosis present

## 2021-12-01 LAB — LACTATE DEHYDROGENASE, PLEURAL OR PERITONEAL FLUID: LD, Fluid: 33 U/L — ABNORMAL HIGH (ref 3–23)

## 2021-12-01 LAB — GLUCOSE, PLEURAL OR PERITONEAL FLUID: Glucose, Fluid: 184 mg/dL

## 2021-12-01 LAB — ECHOCARDIOGRAM COMPLETE
AR max vel: 1.44 cm2
AV Area VTI: 1.46 cm2
AV Area mean vel: 1.29 cm2
AV Mean grad: 2 mmHg
AV Peak grad: 3 mmHg
Ao pk vel: 0.86 m/s
Area-P 1/2: 5.31 cm2
Calc EF: 34.4 %
S' Lateral: 3.79 cm
Single Plane A2C EF: 31.2 %
Single Plane A4C EF: 35.4 %

## 2021-12-01 LAB — PROTEIN, PLEURAL OR PERITONEAL FLUID: Total protein, fluid: <3 g/dL

## 2021-12-01 MED ORDER — ENSURE ENLIVE PO LIQD
237.0000 mL | Freq: Three times a day (TID) | ORAL | Status: DC
Start: 2021-12-01 — End: 2021-12-02
  Administered 2021-12-01: 237 mL via ORAL

## 2021-12-01 MED ORDER — PANTOPRAZOLE SODIUM 40 MG PO TBEC
40.0000 mg | DELAYED_RELEASE_TABLET | Freq: Two times a day (BID) | ORAL | Status: DC
  Administered 2021-12-01 (×2): 40 mg via ORAL
  Filled 2021-12-01: qty 1

## 2021-12-01 MED ORDER — IPRATROPIUM-ALBUTEROL 0.5-2.5 (3) MG/3ML IN SOLN: 3.0000 mL | RESPIRATORY_TRACT | Status: DC | PRN

## 2021-12-01 MED ORDER — IPRATROPIUM-ALBUTEROL 0.5-2.5 (3) MG/3ML IN SOLN
RESPIRATORY_TRACT | Status: AC
  Administered 2021-12-01: 3 mL
  Filled 2021-12-01: qty 3

## 2021-12-01 MED ORDER — VITAMIN C 500 MG PO TABS
500.0000 mg | ORAL_TABLET | Freq: Two times a day (BID) | ORAL | Status: DC
  Administered 2021-12-01: 500 mg via ORAL
  Filled 2021-12-01: qty 1

## 2021-12-01 MED ORDER — ZINC SULFATE 220 (50 ZN) MG PO CAPS
220.0000 mg | ORAL_CAPSULE | Freq: Every day | ORAL | Status: DC
  Administered 2021-12-01: 220 mg via ORAL
  Filled 2021-12-01: qty 1

## 2021-12-01 MED ORDER — ADULT MULTIVITAMIN W/MINERALS CH
1.0000 | ORAL_TABLET | Freq: Every day | ORAL | Status: DC
  Administered 2021-12-01: 1 via ORAL
  Filled 2021-12-01: qty 1

## 2021-12-01 MED ORDER — FUROSEMIDE 10 MG/ML IJ SOLN
40.0000 mg | Freq: Once | INTRAMUSCULAR | Status: AC
  Administered 2021-12-01: 40 mg via INTRAVENOUS
  Filled 2021-12-01: qty 4

## 2021-12-01 NOTE — Plan of Care (Signed)
Pt AAOx4, reports moderate generalized pain. VS are stable. Plan for bed mobility and pain control. Palliative consult pending. Bed in lowest position, call light within reach. Will continue to monitor.

## 2021-12-01 NOTE — Progress Notes (Signed)
Initial Nutrition Assessment  DOCUMENTATION CODES:   Not applicable  INTERVENTION:   -Ensure Enlive po TID, each supplement provides 350 kcal and 20 grams of protein -MVI with minerals daily -Magic cup TID with meals, each supplement provides 290 kcal and 9 grams of protein  -500 mg vitamin C BID -220 mg zinc sulfate daily x 14 days   NUTRITION DIAGNOSIS:   Inadequate oral intake related to poor appetite as evidenced by per patient/family report.  GOAL:   Patient will meet greater than or equal to 90% of their needs  MONITOR:   PO intake, Supplement acceptance, Diet advancement  REASON FOR ASSESSMENT:   Consult Assessment of nutrition requirement/status  ASSESSMENT:   Pt with medical history significant for type 2 diabetes mellitus, hypertension, dyslipidemia, osteoporosis and depression, who presented with acute onset of generalized weakness and admitted to recent episodes of choking to solids more than fluids as well as cough and dyspnea without wheezing.  Pt admitted with aspiration pneumonia.   8/29- downgraded to dysphagia 1 diet with thin liquids  Reviewed I/O's: +1.2 L x 24 hours and +1.5 L since admission   Pt with ongoing poor oral intake and worsening functional status per chart review. Palliative care consult pending for goals of care discussions.   Reviewed wt hx; wt has been stable over the past 3 months.   Medications reviewed and include potassium chloride  Lab Results  Component Value Date   HGBA1C 8.0 (H) 10/29/2021   PTA DM medications are 10 mg glipizide daily.   Labs reviewed: K: 3.0, Phos: 2.3, Mg: 1.1, CBGS: 149 (inpatient orders for glycemic control are 10 mg glipizide daily).    Diet Order:   Diet Order             DIET - DYS 1 Room service appropriate? Yes; Fluid consistency: Thin  Diet effective now                   EDUCATION NEEDS:   No education needs have been identified at this time  Skin:  Skin Assessment: Skin  Integrity Issues: Skin Integrity Issues:: Stage II, Other (Comment) Stage II: lt sacrum Other: non-pressure wound to lt pretibial  Last BM:  12/01/21 (type 6)  Height:   Ht Readings from Last 1 Encounters:  11/29/21 5\' 3"  (1.6 m)    Weight:   Wt Readings from Last 1 Encounters:  11/29/21 63.5 kg    Ideal Body Weight:  52.3 kg  BMI:  Body mass index is 24.8 kg/m.  Estimated Nutritional Needs:   Kcal:  1700-1900  Protein:  90-105 grams  Fluid:  > 1.7 L    12/01/21, RD, LDN, CDCES Registered Dietitian II Certified Diabetes Care and Education Specialist Please refer to [facility] for RD and/or RD on-call/weekend/after hours pager

## 2021-12-01 NOTE — Progress Notes (Signed)
[facility] Liaison note: ? ?This patient is currently enrolled in [facility] outpatient-based Palliative Care. Will continue to follow for disposition. ? ?Please call with any outpatient palliative questions or concerns. ? ?Thank you, ?Dee Curry, LPN ?[facility] Hospital Liaison ?[phone] ?

## 2021-12-01 NOTE — Progress Notes (Signed)
OT Cancellation Note  Patient Details Name: MERLA SAWKA MRN: [id number] DOB: 09-Dec-1935   Cancelled Treatment:    Reason Eval/Treat Not Completed: Other (comment). Consult received, chart reviewed. RN cleared to participate. Daughter present with pt. Daughter reports waiting to speak with other care team members regarding goals of care and possible palliative/hospice. OT discussed role of therapy and per dtr/pt's request, will hold OT evaluation at this time and continue to monitor for appropriateness of therapy intervention pending goals of care discussions upcoming.   Arman Filter., MPH, MS, OTR/L ascom [phone] 12/01/21, 1:42 PM

## 2021-12-01 NOTE — TOC CM/SW Note (Signed)
PT recommending home health. No DME recommendations. Palliative consulted today. Will follow for their recommendations before addressing. Patient was discharged from [facility] on 8/22.  Charlynn Court, CSW [phone]

## 2021-12-01 NOTE — Evaluation (Signed)
Physical Therapy Evaluation Patient Details Name: Amanda Benson MRN: [id number] DOB: 08-12-1935 Today's Date: 12/01/2021  History of Present Illness  86 y.o. female presenting to hospital with respiratory distress/aspiration pneumonia, was here last with vomiting/nausea and sepsis/UTI.  PMH includes DM, htn, depression, osteoporosis, and L ankle surgery.  Clinical Impression  Pt very weak and limited t/o PT exam and subsequent attempts at mobility.  She was limited in her ability to do any real functional mobility and needed max assist to get to and maintain sitting, total assist getting back into bed as well.  Pt is w/c bound/lift reliant at baseline.  Will continue to need 24/7 assist.     Recommendations for follow up therapy are one component of a multi-disciplinary discharge planning process, led by the attending physician.  Recommendations may be updated based on patient status, additional functional criteria and insurance authorization.  Follow Up Recommendations Home health PT      Assistance Recommended at Discharge Frequent or constant Supervision/Assistance  Patient can return home with the following  Two people to help with walking and/or transfers;A lot of help with bathing/dressing/bathroom;Assistance with cooking/housework;Assist for transportation;Help with stairs or ramp for entrance    Equipment Recommendations None recommended by PT  Recommendations for Other Services       Functional Status Assessment Patient has had a recent decline in their functional status and demonstrates the ability to make significant improvements in function in a reasonable and predictable amount of time.     Precautions / Restrictions Precautions Precautions: Fall Restrictions Weight Bearing Restrictions: No      Mobility  Bed Mobility Overal bed mobility: Needs Assistance Bed Mobility: Supine to Sit, Sit to Supine     Supine to sit: Total assist Sit to supine: Total assist    General bed mobility comments: Pt able to give essentially no assist/effort with getting to/from EOB.  Pt could not maintain EOB sitting even with plenty of assist to position and use UEs on rail/mattress.  Pt with minimal postural control falling backward onto bed whenever assist was curtailed.    Transfers                   General transfer comment: unsafe at this time, essentially dependent transfers at baseline    Ambulation/Gait               General Gait Details: has not walked in years  Stairs            Wheelchair Mobility    Modified Rankin (Stroke Patients Only)       Balance Overall balance assessment: Needs assistance Sitting-balance support: Bilateral upper extremity supported, Feet supported Sitting balance-Leahy Scale: Zero Sitting balance - Comments: Even with plenty of set up and cuing pt could not maintain sitting w/o direct assist for more than a second or 2, falling back abruptly without control or appropriate righting responses                                     Pertinent Vitals/Pain Pain Assessment Pain Assessment: Faces Faces Pain Scale: Hurts even more Pain Location: groaning with c/o general soreness "all over" t/o the session, hyper responsive to minimal stimuli (e.g. placing pulse ox or minimally adjusting socks)    Home Living Family/patient expects to be discharged to:: Private residence Living Arrangements: Children Available Help at Discharge: Family;Available 24 hours/day Type of Home: House  Home Access: Ramped entrance       Home Layout: One level Home Equipment: Shower seat;Grab bars - tub/shower;Wheelchair - manual;BSC/3in1;Rollator (4 wheels);Other (comment) (Hoyer Lift vs sit-to-stand lift) Additional Comments: manual hospital bed, sit to stand lift    Prior Function Prior Level of Function : Needs assist             Mobility Comments: Reports staying in bed most of the time. Reports max  assist for transfer from bed to/from w/c when she can tolerate - daughter max assist vs lift ADLs Comments: pt requires assist for all ADL/IADL     Hand Dominance        Extremity/Trunk Assessment   Upper Extremity Assessment Upper Extremity Assessment: Generalized weakness    Lower Extremity Assessment Lower Extremity Assessment: Generalized weakness       Communication   Communication: HOH  Cognition Arousal/Alertness: Awake/alert Behavior During Therapy: Flat affect Overall Cognitive Status: Within Functional Limits for tasks assessed                                          General Comments General comments (skin integrity, edema, etc.): Pt extremely weak and limited with mobility and generally.  On arrival pt on room air with O2 in the mid/high 80s, could not breach 90, spoke with nursing and placed on 2L [place] with sats reaching the 90s but never >93%.    Exercises General Exercises - Lower Extremity Ankle Circles/Pumps: AROM, Both, 10 reps Heel Slides: AAROM, 5 reps Hip ABduction/ADduction: AAROM, 5 reps   Assessment/Plan    PT Assessment Patient needs continued PT services  PT Problem List Decreased strength;Decreased range of motion;Decreased activity tolerance;Decreased balance;Decreased mobility;Decreased coordination;Decreased knowledge of use of DME;Decreased safety awareness;Cardiopulmonary status limiting activity;Pain       PT Treatment Interventions DME instruction;Gait training;Functional mobility training;Therapeutic activities;Therapeutic exercise;Balance training;Neuromuscular re-education;Patient/family education;Wheelchair mobility training    PT Goals (Current goals can be found in the Care Plan section)  Acute Rehab PT Goals Patient Stated Goal: none stated PT Goal Formulation: With patient Time For Goal Achievement: 12/14/21 Potential to Achieve Goals: Fair    Frequency Min 2X/week     Co-evaluation                AM-PAC PT "6 Clicks" Mobility  Outcome Measure Help needed turning from your back to your side while in a flat bed without using bedrails?: Total Help needed moving from lying on your back to sitting on the side of a flat bed without using bedrails?: Total Help needed moving to and from a bed to a chair (including a wheelchair)?: Total Help needed standing up from a chair using your arms (e.g., wheelchair or bedside chair)?: Total Help needed to walk in hospital room?: Total Help needed climbing 3-5 steps with a railing? : Total 6 Click Score: 6    End of Session   Activity Tolerance: Patient limited by fatigue Patient left: with bed alarm set;with call bell/phone within reach Nurse Communication: Mobility status (need for O2) PT Visit Diagnosis: Muscle weakness (generalized) (M62.81);Difficulty in walking, not elsewhere classified (R26.2);Unsteadiness on feet (R26.81)    Time: 4098-1191 PT Time Calculation (min) (ACUTE ONLY): 28 min   Charges:   PT Evaluation $PT Eval Low Complexity: 1 Low PT Treatments $Therapeutic Activity: 8-22 mins        Malachi Pro, DPT 12/01/2021, 10:23 AM

## 2021-12-01 NOTE — Progress Notes (Signed)
RT called to pt room to assess for possible neb treatment. Pt tachynic with use of accessory muscles. Resp Protocol Assessment completed and prn neb ordered.

## 2021-12-01 NOTE — Progress Notes (Signed)
PROGRESS NOTE    Amanda Benson  MAU:[id number] DOB: 08/24/1935 DOA: 11/14/2021 PCP: Judy Pimple, MD    Brief Narrative:  86 year old with multiple medical issues including type 2 diabetes, essential hypertension, dyslipidemia, depression and mobility issues, chronic dysphagia and hiatal hernia with reflux presented with cough, weakness, shortness of breath without wheezing.  In the emergency room on room air.  Chest x-ray with right suprahilar infiltrates, bilateral pleural effusions.  Does have history of episodic choking on food.   Assessment & Plan: Aspiration pneumonia, likely reflux with GERD and hiatal hernia. Acute respiratory failure with hypoxemia, presented with oxygen saturation 84% and currently on 2 L oxygen. CT scan was ordered and reviewed, shows right middle lobe pneumonia, continue Unasyn.   Speech therapy evaluated the patient, advised aspiration precautions and regular diet. reflux precautions, increase PPI twice daily. Diagnostic thoracentesis with 1 L transudate.  Cultures and pathology pending. 2D echocardiogram to rule out congestive heart failure given recent worsening dyspnea and bilateral pleural effusions. Reflux precautions.  She does have a big hiatal hernia, elevate head of bed.  Not a surgical candidate.  See goal of care discussion below.  Hypokalemia: Replaced.  Depression: On Wellbutrin and Prozac.  Type 2 diabetes with diabetic neuropathy: On Glucotrol and sliding scale insulin.  Severe protein calorie malnutrition Failure to thrive Hypoalbuminemia, albumin less than 2 Frailty and debility, bedbound status Goal of care discussion  Patient with currently worsening status, weight loss, poor appetite and albumin less than 2.  Failure to thrive.  Discussed with daughter at the bedside, will continue to investigate for any reversible causes, however patient may be reaching end-of-life. Followed by palliative care at home. Consult palliative  medicine, may benefit with home hospice program.        DVT prophylaxis: enoxaparin (LOVENOX) injection 40 mg Start: 12/03/2021 0800   Code Status: DNR Family Communication: Daughter at the bedside Disposition Plan: Status is: Inpatient Remains inpatient appropriate because: IV antibiotics, shortness of breath     Consultants:  Palliative care  Procedures:  Right thoracentesis  Antimicrobials:  Unasyn 8/28---   Subjective: Patient seen and examined.  Hard of hearing.  She tells me she feels okay today.  She complains of poor appetite and early satiety.  Objective: Vitals:   11/08/2021 1600 11/21/2021 1957 12/01/21 0316 12/01/21 0759  BP: 137/78 127/67 133/75 121/78  Pulse: 90 94 98 91  Resp: 18 18 18 20   Temp: (!) 97.3 F (36.3 C) 97.8 F (36.6 C) 97.8 F (36.6 C) (!) 97.5 F (36.4 C)  TempSrc: Oral Oral Oral Oral  SpO2: 94% 92% 92% 90%  Weight:      Height:        Intake/Output Summary (Last 24 hours) at 12/01/2021 1334 Last data filed at 11/27/2021 1705 Gross per 24 hour  Intake 1100 ml  Output --  Net 1100 ml   Filed Weights   11/29/21 1938  Weight: 63.5 kg    Examination:  General: Frail and debilitated.  Chronically sick looking.  Not in any distress.  On 2 L oxygen. Cardiovascular: S1-S2 normal.  Regular rate rhythm. Respiratory: Bilateral poor air entry at bases. Gastrointestinal: Soft.  Nontender. Ext: No deformities.  No cyanosis or edema. Neuro: Alert and awake.  Mostly oriented.  Generalized weakness, however moves all extremities.    Data Reviewed: I have personally reviewed following labs and imaging studies  CBC: Recent Labs  Lab 11/29/21 1948 11/21/2021 0341  WBC 11.2* 9.6  HGB 10.8*  8.7*  HCT 35.8* 29.4*  MCV 86.1 89.4  PLT 688* 502*   Basic Metabolic Panel: Recent Labs  Lab 11/29/21 1948 23-Dec-2021 0341  NA 140 141  K 3.7 3.0*  CL 106 112*  CO2 25 24  GLUCOSE 181* 144*  BUN 34* 30*  CREATININE 1.15* 1.02*  CALCIUM 9.0  7.3*   GFR: Estimated Creatinine Clearance: 36.8 mL/min (A) (by C-G formula based on SCr of 1.02 mg/dL (H)). Liver Function Tests: Recent Labs  Lab 11/29/21 1948  AST 22  ALT 11  ALKPHOS 67  BILITOT 0.7  PROT 6.5  ALBUMIN 2.3*   No results for input(s): "LIPASE", "AMYLASE" in the last 168 hours. No results for input(s): "AMMONIA" in the last 168 hours. Coagulation Profile: No results for input(s): "INR", "PROTIME" in the last 168 hours. Cardiac Enzymes: No results for input(s): "CKTOTAL", "CKMB", "CKMBINDEX", "TROPONINI" in the last 168 hours. BNP (last 3 results) No results for input(s): "PROBNP" in the last 8760 hours. HbA1C: No results for input(s): "HGBA1C" in the last 72 hours. CBG: No results for input(s): "GLUCAP" in the last 168 hours. Lipid Profile: No results for input(s): "CHOL", "HDL", "LDLCALC", "TRIG", "CHOLHDL", "LDLDIRECT" in the last 72 hours. Thyroid Function Tests: No results for input(s): "TSH", "T4TOTAL", "FREET4", "T3FREE", "THYROIDAB" in the last 72 hours. Anemia Panel: No results for input(s): "VITAMINB12", "FOLATE", "FERRITIN", "TIBC", "IRON", "RETICCTPCT" in the last 72 hours. Sepsis Labs: No results for input(s): "PROCALCITON", "LATICACIDVEN" in the last 168 hours.  Recent Results (from the past 240 hour(s))  SARS Coronavirus 2 by RT PCR (hospital order, performed in [facility] hospital lab) *cepheid single result test* Anterior Nasal Swab     Status: None   Collection Time: 12-23-21  2:57 AM   Specimen: Anterior Nasal Swab  Result Value Ref Range Status   SARS Coronavirus 2 by RT PCR NEGATIVE NEGATIVE Final    Comment: (NOTE) SARS-CoV-2 target nucleic acids are NOT DETECTED.  The SARS-CoV-2 RNA is generally detectable in upper and lower respiratory specimens during the acute phase of infection. The lowest concentration of SARS-CoV-2 viral copies this assay can detect is 250 copies / mL. A negative result does not preclude SARS-CoV-2  infection and should not be used as the sole basis for treatment or other patient management decisions.  A negative result may occur with improper specimen collection / handling, submission of specimen other than nasopharyngeal swab, presence of viral mutation(s) within the areas targeted by this assay, and inadequate number of viral copies (<250 copies / mL). A negative result must be combined with clinical observations, patient history, and epidemiological information.  Fact Sheet for Patients:   RoadLapTop.co.za  Fact Sheet for Healthcare Providers: http://kim-miller.com/  This test is not yet approved or  cleared by the [place] FDA and has been authorized for detection and/or diagnosis of SARS-CoV-2 by FDA under an Emergency Use Authorization (EUA).  This EUA will remain in effect (meaning this test can be used) for the duration of the COVID-19 declaration under Section 564(b)(1) of the Act, 21 U.S.C. section 360bbb-3(b)(1), unless the authorization is terminated or revoked sooner.  Performed at [facility], [address] [place], [place] [address]   Body fluid culture w Gram Stain     Status: None (Preliminary result)   Collection Time: 23-Dec-2021  4:00 PM   Specimen: PATH Cytology Pleural fluid  Result Value Ref Range Status   Specimen Description   Final    PLEURAL Performed at [facility], [address], [place], [place] [address]    Special Requests   Final    PLEURAL Performed at [facility], [address] [place] [address] [place], [place] [address]    Gram Stain   Final    WBC PRESENT, PREDOMINANTLY MONONUCLEAR NO ORGANISMS SEEN CYTOSPIN SMEAR    Culture   Final    NO GROWTH < 24 HOURS Performed at [facility], [address], [place], [place] [address]    Report Status PENDING  Incomplete         Radiology Studies: US THORACENTESIS ASP PLEURAL SPACE W/IMG GUIDE  Result  Date: 11/15/2021 INDICATION: Patient with history of diabetes, hypertension, dyslipidemia. Presented to the ED with generalized weakness found to be hypoxic with bilateral pleural effusions. Team is requesting therapeutic and diagnostic thoracentesis EXAM: ULTRASOUND GUIDED THERAPEUTIC AND DIAGNOSTIC THORACENTESIS MEDICATIONS: Lidocaine 1% 10 mL COMPLICATIONS: None immediate. PROCEDURE: An ultrasound guided thoracentesis was thoroughly discussed with the patient and questions answered. The benefits, risks, alternatives and complications were also discussed. The patient understands and wishes to proceed with the procedure. Written consent was obtained. Ultrasound was performed to localize and mark an adequate pocket of fluid in the RIGHT chest. The area was then prepped and draped in the normal sterile fashion. 1% Lidocaine was used for local anesthesia. Under ultrasound guidance a 6 Fr Safe-T-Centesis catheter was introduced. Thoracentesis was performed. The catheter was removed and a dressing applied. FINDINGS: A total of approximately 950 mL of straw-colored fluid was removed. Samples were sent to the laboratory as requested by the clinical team. IMPRESSION: Successful ultrasound guided therapeutic and diagnostic RIGHT thoracentesis yielding 950 mL of pleural fluid. Read by: Anders Grant, NP Electronically Signed   By: Roanna Banning M.D.   On: 11/21/2021 16:06   DG Chest Port 1 View  Result Date: 11/12/2021 CLINICAL DATA:  Post thoracentesis. EXAM: PORTABLE CHEST 1 VIEW COMPARISON:  Chest XR, 11/29/2021.  CT chest, 11/29/2021. FINDINGS: Enlarged cardiac silhouette. Aortic arch calcifications. The RIGHT lung is well inflated. Perihilar interstitial thickening, greater on RIGHT. Silhouetting of the LEFT lung base with a small volume LEFT pleural effusion. No residual RIGHT pleural effusion or evidence of pneumothorax. No interval osseous abnormality. Cholecystectomy clips. IMPRESSION: 1. No residual RIGHT  pleural effusion or evidence of pneumothorax. 2. Cardiomegaly with small volume LEFT pleural effusion. Electronically Signed   By: Roanna Banning M.D.   On: 11/29/2021 15:50   CT CHEST WO CONTRAST  Result Date: 11/13/2021 CLINICAL DATA:  Pneumonia. EXAM: CT CHEST WITHOUT CONTRAST TECHNIQUE: Multidetector CT imaging of the chest was performed following the standard protocol without IV contrast. RADIATION DOSE REDUCTION: This exam was performed according to the departmental dose-optimization program which includes automated exposure control, adjustment of the mA and/or kV according to patient size and/or use of iterative reconstruction technique. COMPARISON:  November 29, 2021. FINDINGS: Cardiovascular: Atherosclerosis of thoracic aorta is noted without aneurysm formation. Mild cardiomegaly is noted. No pericardial effusion is noted. Mediastinum/Nodes: Small sliding-type hiatal hernia is noted. No adenopathy is noted. Thyroid gland is unremarkable. Lungs/Pleura: No pneumothorax is noted. Moderate bilateral pleural effusions are noted with adjacent atelectasis of both lower lobes. Right upper lobe airspace opacity is noted concerning for pneumonia. Mild left posterior basilar subsegmental atelectasis is noted. Upper Abdomen: No acute abnormality. Musculoskeletal: No chest wall mass or suspicious bone lesions identified. IMPRESSION: Moderate bilateral pleural effusions are noted with adjacent atelectasis of both lower lobes. Right upper lobe airspace opacity is noted concerning for  pneumonia. Small sliding-type hiatal hernia. Aortic Atherosclerosis (ICD10-I70.0). Electronically Signed   By: Lupita Raider M.D.   On: 2021-12-27 09:44   DG Chest 1 View  Result Date: 11/29/2021 CLINICAL DATA:  Weakness. EXAM: CHEST  1 VIEW COMPARISON:  October 21, 2020 FINDINGS: The cardiac silhouette is mildly enlarged. Marked severity calcification of the aortic arch is noted. Mild infiltrate is seen along the suprahilar region on the  right. Opacification of the retrocardiac region of the left lung base is seen. A partially layering right pleural effusion is noted. No pneumothorax is seen. Multilevel degenerative changes seen throughout the thoracic spine. IMPRESSION: 1. Mild right suprahilar infiltrate. 2. Opacification of the retrocardiac region of the left lung base consistent with atelectasis and/or infiltrate. 3. Partially layering right pleural effusion. Electronically Signed   By: Aram Candela M.D.   On: 11/29/2021 22:13        Scheduled Meds:  amLODipine  10 mg Oral Daily   buPROPion  300 mg Oral Daily   enoxaparin (LOVENOX) injection  40 mg Subcutaneous Q24H   FLUoxetine  40 mg Oral Daily   glipiZIDE  10 mg Oral Q breakfast   hydrochlorothiazide  12.5 mg Oral q morning   loratadine  10 mg Oral Daily   pantoprazole  40 mg Oral BID   potassium chloride  40 mEq Oral BID   raloxifene  60 mg Oral Daily   Continuous Infusions:  ampicillin-sulbactam (UNASYN) IV 3 g (12/01/21 0640)     LOS: 1 day    Time spent: 35 minutes    Dorcas Carrow, MD [facility] Pager [phone]

## 2021-12-02 LAB — GLUCOSE, CAPILLARY: Glucose-Capillary: 69 mg/dL — ABNORMAL LOW (ref 70–99)

## 2021-12-02 LAB — PROTEIN, BODY FLUID (OTHER): Total Protein, Body Fluid Other: 1 g/dL

## 2021-12-02 LAB — CYTOLOGY - NON PAP

## 2021-12-04 LAB — BODY FLUID CULTURE W GRAM STAIN: Culture: NO GROWTH

## 2021-12-04 LAB — MISC LABCORP TEST (SEND OUT): Labcorp test code: 5367

## 2021-12-04 NOTE — Death Summary Note (Signed)
DEATH SUMMARY   Patient Details  Name: Amanda Benson MRN: [id number] DOB: 30-Mar-1936 [id number], Amanda Fanny, MD Admission/Discharge Information   Admit Date:  12-16-21  Date of Death: Date of Death: Dec 18, 2021  Time of Death: Time of Death: 0128  Length of Stay: 2   Principle Cause of death: Aspiration pneumonia  Hospital Diagnoses: Principal Problem:   Aspiration pneumonia ([place]) Active Problems:   Acute respiratory failure with hypoxia (HCC)   Hypokalemia   Type 2 diabetes mellitus with diabetic neuropathy, unspecified ([place])   Essential hypertension   Depression   GERD without esophagitis   Pressure injury of skin   Hospital Course: 86 year old with multiple medical issues including type 2 diabetes, hypertension, depression, mobility issues and bedbound status, chronic dysphagia and hiatal hernia who was brought to the emergency room with ongoing cough, weakness, shortness of breath.  She was found to have bilateral pleural effusion, right middle lobe pneumonia and significant dysphagia.  Patient was also with failure to thrive, worsening oxygen requirement.  Her albumin was less than 2 consistent with severe protein calorie malnutrition.  Patient was very frail and debilitated.  Her new ejection fraction was 25 to 30%.  Patient was admitted to the hospital, she underwent right thoracentesis with removal of transudate fluid of 1 L.  She was seen by speech therapy and was started on aspiration precautions and dysphagia diet.  She was getting antibiotic treatment.  Palliative care was consulted to discuss possible home hospice.  While she was in the hospital, she noted to have acute respiratory arrest with agonal breathing at the middle of the night.  Patient was DNR, she was given medications including Atrovent and nonrebreather but she did not survive.  She was pronounced death at [facility] on 12-18-21.       Procedures: Thoracentesis  Consultations: Palliative care  The results  of significant diagnostics from this hospitalization (including imaging, microbiology, ancillary and laboratory) are listed below for reference.   Significant Diagnostic Studies: ECHOCARDIOGRAM COMPLETE  Result Date: 12/01/2021    ECHOCARDIOGRAM REPORT   Patient Name:   Amanda Benson Date of Exam: 12/01/2021 Medical Rec #:  [id number]       Height:       63.0 in Accession #:    [id number]      Weight:       140.0 lb Date of Birth:  [birth year]        BSA:          1.662 m Patient Age:    86 years        BP:           121/78 mmHg Patient Gender: F               HR:           88 bpm. Exam Location:  [facility] Procedure: 2D Echo, Cardiac Doppler and Color Doppler Indications:     R06.00 Dyspnea  History:         Patient has no prior history of Echocardiogram examinations.                  Risk Factors:Diabetes, Hypertension and Dyslipidemia.  Sonographer:     Rosalia Hammers Referring Phys:  [id number] Barb Merino Diagnosing Phys: Nelva Bush MD IMPRESSIONS  1. Left ventricular ejection fraction, by estimation, is 25 to 30%. The left ventricle has severely decreased function. The left ventricle demonstrates global hypokinesis. Left ventricular diastolic parameters are indeterminate.  2. Right  ventricular systolic function is moderately reduced. The right ventricular size is normal. There is moderately elevated pulmonary artery systolic pressure.  3. Left atrial size was moderately dilated.  4. Right atrial size was mildly dilated.  5. Moderate pleural effusion in the left lateral region.  6. The mitral valve is abnormal. Moderate mitral valve regurgitation. No evidence of mitral stenosis.  7. Tricuspid valve regurgitation is moderate to severe.  8. The aortic valve is tricuspid. Aortic valve regurgitation is mild.  9. The inferior vena cava is dilated in size with <50% respiratory variability, suggesting right atrial pressure of 15 mmHg. FINDINGS  Left Ventricle: Left ventricular ejection fraction, by estimation, is 25  to 30%. The left ventricle has severely decreased function. The left ventricle demonstrates global hypokinesis. The left ventricular internal cavity size was normal in size. There is borderline left ventricular hypertrophy. Left ventricular diastolic parameters are indeterminate. Right Ventricle: The right ventricular size is normal. No increase in right ventricular wall thickness. Right ventricular systolic function is moderately reduced. There is moderately elevated pulmonary artery systolic pressure. The tricuspid regurgitant velocity is 3.16 m/s, and with an assumed right atrial pressure of 15 mmHg, the estimated right ventricular systolic pressure is 123456 mmHg. Left Atrium: Left atrial size was moderately dilated. Right Atrium: Right atrial size was mildly dilated. Pericardium: There is no evidence of pericardial effusion. Mitral Valve: The mitral valve is abnormal. There is mild thickening of the mitral valve leaflet(s). Moderate mitral valve regurgitation. No evidence of mitral valve stenosis. Tricuspid Valve: The tricuspid valve is normal in structure. Tricuspid valve regurgitation is moderate to severe. Aortic Valve: The aortic valve is tricuspid. Aortic valve regurgitation is mild. Aortic valve mean gradient measures 2.0 mmHg. Aortic valve peak gradient measures 3.0 mmHg. Aortic valve area, by VTI measures 1.46 cm. Pulmonic Valve: The pulmonic valve was normal in structure. Pulmonic valve regurgitation is mild. No evidence of pulmonic stenosis. Aorta: The aortic root and ascending aorta are structurally normal, with no evidence of dilitation. Pulmonary Artery: The pulmonary artery is of normal size. Venous: The inferior vena cava is dilated in size with less than 50% respiratory variability, suggesting right atrial pressure of 15 mmHg. IAS/Shunts: The interatrial septum was not well visualized. Additional Comments: There is a moderate pleural effusion in the left lateral region.  LEFT VENTRICLE PLAX 2D  LVIDd:         4.28 cm LVIDs:         3.79 cm LV PW:         0.92 cm LV IVS:        1.04 cm LVOT diam:     1.70 cm LV SV:         22 LV SV Index:   13 LVOT Area:     2.27 cm  LV Volumes (MOD) LV vol d, MOD A2C: 87.8 ml LV vol d, MOD A4C: 96.2 ml LV vol s, MOD A2C: 60.4 ml LV vol s, MOD A4C: 62.1 ml LV SV MOD A2C:     27.4 ml LV SV MOD A4C:     96.2 ml LV SV MOD BP:      32.4 ml RIGHT VENTRICLE RV Basal diam:  2.61 cm RV Mid diam:    2.89 cm RV S prime:     8.70 cm/s TAPSE (M-mode): 1.0 cm LEFT ATRIUM             Index        RIGHT ATRIUM  Index LA diam:        3.80 cm 2.29 cm/m   RA Area:     17.40 cm LA Vol (A2C):   87.3 ml 52.54 ml/m  RA Volume:   54.70 ml  32.92 ml/m LA Vol (A4C):   61.0 ml 36.71 ml/m LA Biplane Vol: 74.4 ml 44.77 ml/m  AORTIC VALVE                    PULMONIC VALVE AV Area (Vmax):    1.44 cm     PR End Diast Vel: 9.99 msec AV Area (Vmean):   1.29 cm AV Area (VTI):     1.46 cm AV Vmax:           86.10 cm/s AV Vmean:          57.200 cm/s AV VTI:            0.151 m AV Peak Grad:      3.0 mmHg AV Mean Grad:      2.0 mmHg LVOT Vmax:         54.80 cm/s LVOT Vmean:        32.500 cm/s LVOT VTI:          0.097 m LVOT/AV VTI ratio: 0.64  AORTA Ao Root diam: 2.90 cm MITRAL VALVE                TRICUSPID VALVE MV Area (PHT): 5.31 cm     TR Peak grad:   39.9 mmHg MV Decel Time: 143 msec     TR Vmax:        316.00 cm/s MV E velocity: 114.00 cm/s                             SHUNTS                             Systemic VTI:  0.10 m                             Systemic Diam: 1.70 cm Yvonne Kendall MD Electronically signed by Yvonne Kendall MD Signature Date/Time: 12/01/2021/3:15:20 PM    Final    US THORACENTESIS ASP PLEURAL SPACE W/IMG GUIDE  Result Date: 30-Dec-2021 INDICATION: Patient with history of diabetes, hypertension, dyslipidemia. Presented to the ED with generalized weakness found to be hypoxic with bilateral pleural effusions. Team is requesting therapeutic and diagnostic  thoracentesis EXAM: ULTRASOUND GUIDED THERAPEUTIC AND DIAGNOSTIC THORACENTESIS MEDICATIONS: Lidocaine 1% 10 mL COMPLICATIONS: None immediate. PROCEDURE: An ultrasound guided thoracentesis was thoroughly discussed with the patient and questions answered. The benefits, risks, alternatives and complications were also discussed. The patient understands and wishes to proceed with the procedure. Written consent was obtained. Ultrasound was performed to localize and mark an adequate pocket of fluid in the RIGHT chest. The area was then prepped and draped in the normal sterile fashion. 1% Lidocaine was used for local anesthesia. Under ultrasound guidance a 6 Fr Safe-T-Centesis catheter was introduced. Thoracentesis was performed. The catheter was removed and a dressing applied. FINDINGS: A total of approximately 950 mL of straw-colored fluid was removed. Samples were sent to the laboratory as requested by the clinical team. IMPRESSION: Successful ultrasound guided therapeutic and diagnostic RIGHT thoracentesis yielding 950 mL of pleural fluid. Read by: Anders Grant, NP Electronically Signed   By: Cletis Athens  Mugweru M.D.   On: 11/22/2021 16:06   DG Chest Port 1 View  Result Date: 12/01/2021 CLINICAL DATA:  Post thoracentesis. EXAM: PORTABLE CHEST 1 VIEW COMPARISON:  Chest XR, 11/29/2021.  CT chest, 11/08/2021. FINDINGS: Enlarged cardiac silhouette. Aortic arch calcifications. The RIGHT lung is well inflated. Perihilar interstitial thickening, greater on RIGHT. Silhouetting of the LEFT lung base with a small volume LEFT pleural effusion. No residual RIGHT pleural effusion or evidence of pneumothorax. No interval osseous abnormality. Cholecystectomy clips. IMPRESSION: 1. No residual RIGHT pleural effusion or evidence of pneumothorax. 2. Cardiomegaly with small volume LEFT pleural effusion. Electronically Signed   By: Roanna Banning M.D.   On: 11/19/2021 15:50   CT CHEST WO CONTRAST  Result Date: 11/29/2021 CLINICAL DATA:   Pneumonia. EXAM: CT CHEST WITHOUT CONTRAST TECHNIQUE: Multidetector CT imaging of the chest was performed following the standard protocol without IV contrast. RADIATION DOSE REDUCTION: This exam was performed according to the departmental dose-optimization program which includes automated exposure control, adjustment of the mA and/or kV according to patient size and/or use of iterative reconstruction technique. COMPARISON:  November 29, 2021. FINDINGS: Cardiovascular: Atherosclerosis of thoracic aorta is noted without aneurysm formation. Mild cardiomegaly is noted. No pericardial effusion is noted. Mediastinum/Nodes: Small sliding-type hiatal hernia is noted. No adenopathy is noted. Thyroid gland is unremarkable. Lungs/Pleura: No pneumothorax is noted. Moderate bilateral pleural effusions are noted with adjacent atelectasis of both lower lobes. Right upper lobe airspace opacity is noted concerning for pneumonia. Mild left posterior basilar subsegmental atelectasis is noted. Upper Abdomen: No acute abnormality. Musculoskeletal: No chest wall mass or suspicious bone lesions identified. IMPRESSION: Moderate bilateral pleural effusions are noted with adjacent atelectasis of both lower lobes. Right upper lobe airspace opacity is noted concerning for pneumonia. Small sliding-type hiatal hernia. Aortic Atherosclerosis (ICD10-I70.0). Electronically Signed   By: Lupita Raider M.D.   On: 11/03/2021 09:44   DG Chest 1 View  Result Date: 11/29/2021 CLINICAL DATA:  Weakness. EXAM: CHEST  1 VIEW COMPARISON:  October 21, 2020 FINDINGS: The cardiac silhouette is mildly enlarged. Marked severity calcification of the aortic arch is noted. Mild infiltrate is seen along the suprahilar region on the right. Opacification of the retrocardiac region of the left lung base is seen. A partially layering right pleural effusion is noted. No pneumothorax is seen. Multilevel degenerative changes seen throughout the thoracic spine. IMPRESSION:  1. Mild right suprahilar infiltrate. 2. Opacification of the retrocardiac region of the left lung base consistent with atelectasis and/or infiltrate. 3. Partially layering right pleural effusion. Electronically Signed   By: Aram Candela M.D.   On: 11/29/2021 22:13    Microbiology: Recent Results (from the past 240 hour(s))  SARS Coronavirus 2 by RT PCR (hospital order, performed in [facility] hospital lab) *cepheid single result test* Anterior Nasal Swab     Status: None   Collection Time: 11/20/2021  2:57 AM   Specimen: Anterior Nasal Swab  Result Value Ref Range Status   SARS Coronavirus 2 by RT PCR NEGATIVE NEGATIVE Final    Comment: (NOTE) SARS-CoV-2 target nucleic acids are NOT DETECTED.  The SARS-CoV-2 RNA is generally detectable in upper and lower respiratory specimens during the acute phase of infection. The lowest concentration of SARS-CoV-2 viral copies this assay can detect is 250 copies / mL. A negative result does not preclude SARS-CoV-2 infection and should not be used as the sole basis for treatment or other patient management decisions.  A negative result may occur with improper specimen  collection / handling, submission of specimen other than nasopharyngeal swab, presence of viral mutation(s) within the areas targeted by this assay, and inadequate number of viral copies (<250 copies / mL). A negative result must be combined with clinical observations, patient history, and epidemiological information.  Fact Sheet for Patients:   https://www.patel.info/  Fact Sheet for Healthcare Providers: https://hall.com/  This test is not yet approved or  cleared by the [place] FDA and has been authorized for detection and/or diagnosis of SARS-CoV-2 by FDA under an Emergency Use Authorization (EUA).  This EUA will remain in effect (meaning this test can be used) for the duration of the COVID-19 declaration under Section 564(b)(1)  of the Act, 21 U.S.C. section 360bbb-3(b)(1), unless the authorization is terminated or revoked sooner.  Performed at [facility], [address] [place], [place] [address]   Body fluid culture w Gram Stain     Status: None (Preliminary result)   Collection Time: 12/03/2021  4:00 PM   Specimen: PATH Cytology Pleural fluid  Result Value Ref Range Status   Specimen Description   Final    PLEURAL Performed at [facility], [address] [place], [place] [address]    Special Requests   Final    PLEURAL Performed at [facility], [address] [place], [place] [address]    Gram Stain   Final    WBC PRESENT, PREDOMINANTLY MONONUCLEAR NO ORGANISMS SEEN CYTOSPIN SMEAR    Culture   Final    NO GROWTH 2 DAYS Performed at [facility], [place] [address] [place], [place] [address]    Report Status PENDING  Incomplete      Signed: Barb Merino, MD 12/15/21

## 2021-12-04 NOTE — Progress Notes (Signed)
Patient deceased. Prayer emotional support for the family.

## 2021-12-04 NOTE — Significant Event (Signed)
       CROSS COVER NOTE  NAME: Amanda Benson MRN: [id number] DOB : 08/28/1935    Responded to overhead CODE BLUE alert. [name] is documented as DNR in the chart, no family at bedside. On arrival to bedside patient had an agonal breathing pattern on 15L via NRB and was bradycardic with HR in 30s. BP unreadable on Dinamap. Atropine x1 given without effect and patient lost pulses.  Patient unresponsive to noxious stimuli. Pupils fixed and dilated. No spontaneous respirations.  No heart sounds auscultated.  No respirations auscultated. No palpable pulse in the right carotid or left femoral.  No dopplerable pulse in the right carotid or left femoral.    [name] pronounced deceased at 0128 on 12/18/2021  Daughter Jennfier notified at [phone].  This document was prepared using Dragon voice recognition software and may include unintentional dictation errors.  Bishop Limbo DNP, MHA, FNP-BC Nurse Practitioner [facility] Pager [phone]

## 2021-12-04 DEATH — deceased

## 2022-06-14 IMAGING — CT CT ABD-PELV W/O CM
2 of 4 series · 16 of 46 positions shown, 18 images · non-contrast
Comparison: 11/23/2019

CLINICAL DATA: Abdominal pain, weakness, gluteal ulcer

EXAM:
CT ABDOMEN AND PELVIS WITHOUT CONTRAST
TECHNIQUE: Multidetector CT imaging of the abdomen and pelvis was performed
following the standard protocol without IV contrast.

[Series 2: routine abd/pel wo · axial · 0.88mm/px · z∈[-895,-465]mm · 13 of 94 slices shown, 15 images]
[im 4/94  soft-tissue]
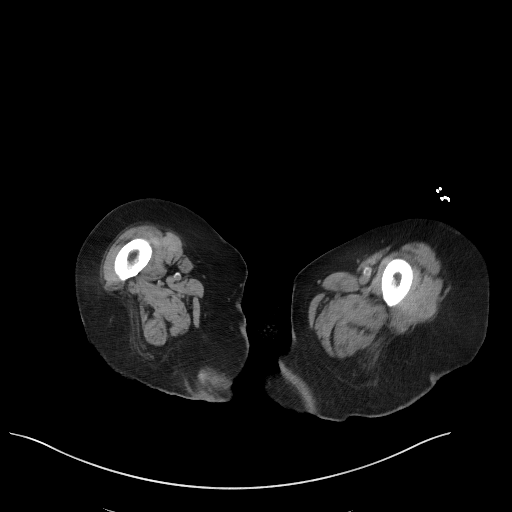
[im 4/94  bone]
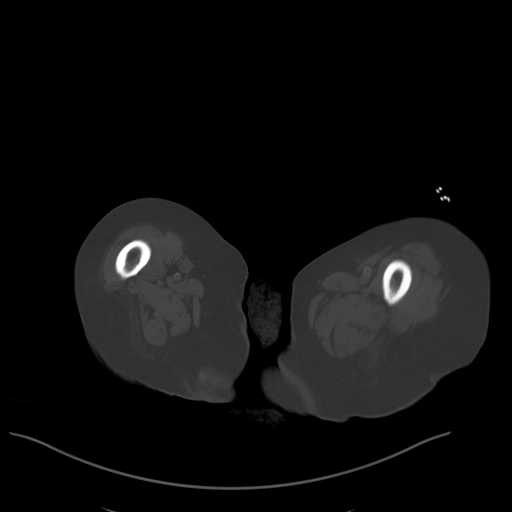
[im 12/94  soft-tissue]
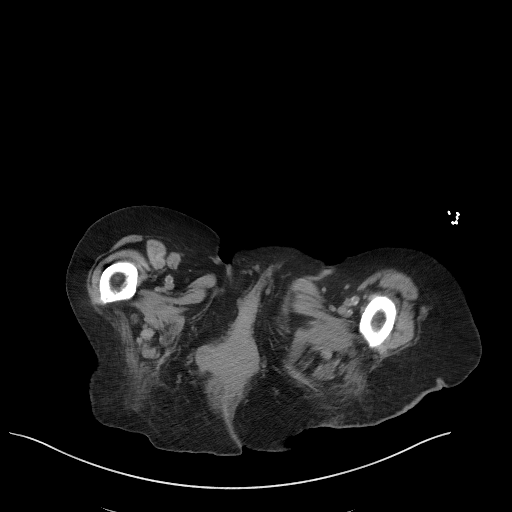
[im 19/94  soft-tissue]
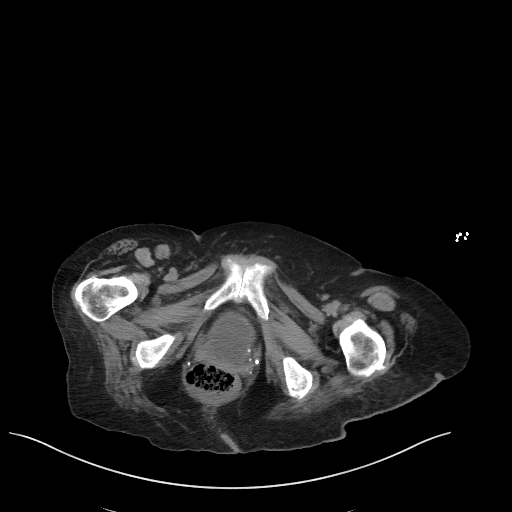
[im 27/94  soft-tissue]
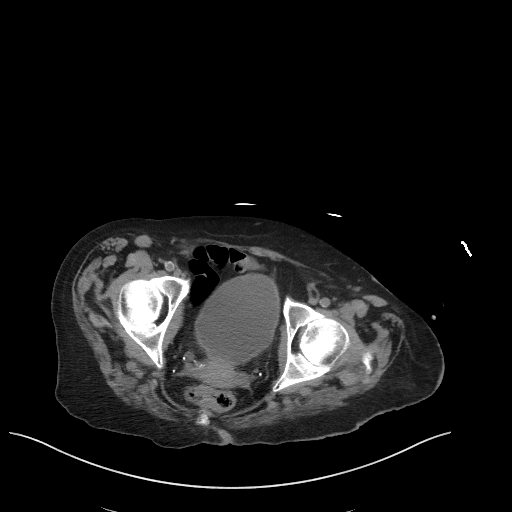
[im 34/94  soft-tissue]
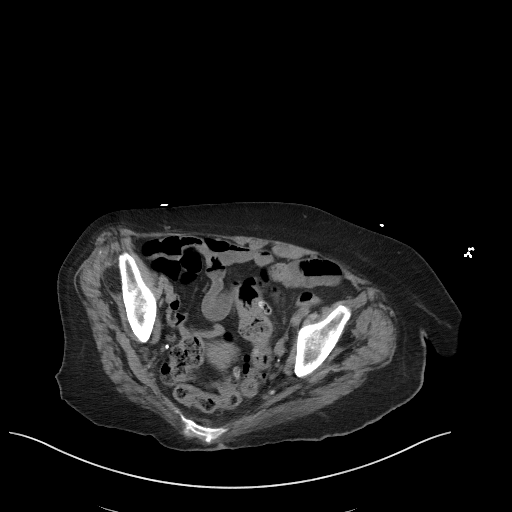
[im 41/94  soft-tissue]
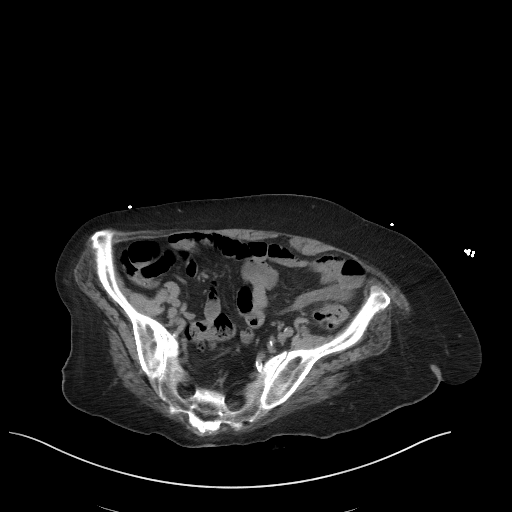
[im 49/94  soft-tissue]
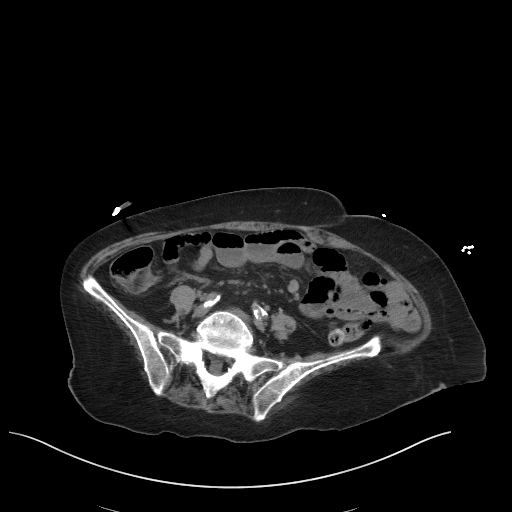
[im 53/94  soft-tissue]
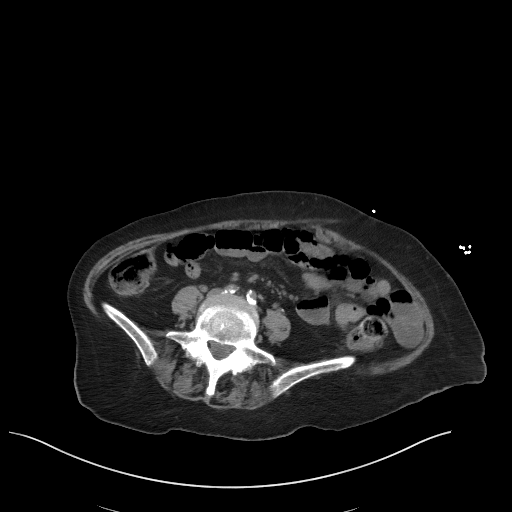
[im 60/94  soft-tissue]
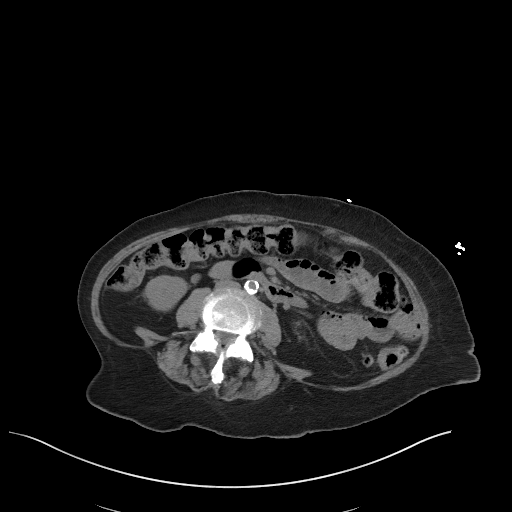
[im 60/94  bone]
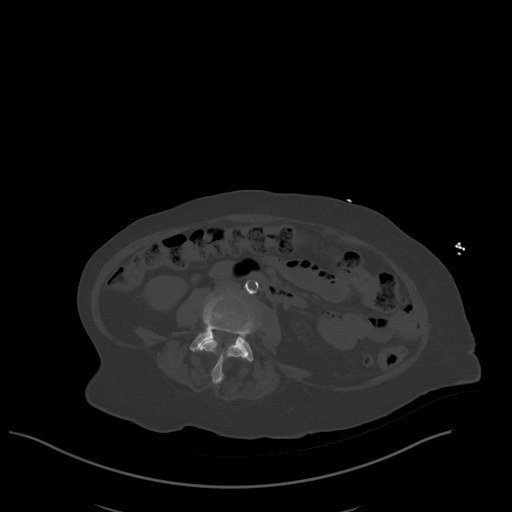
[im 67/94  soft-tissue]
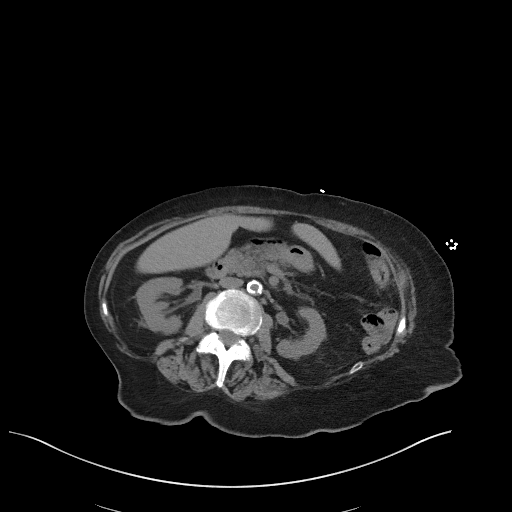
[im 75/94  soft-tissue]
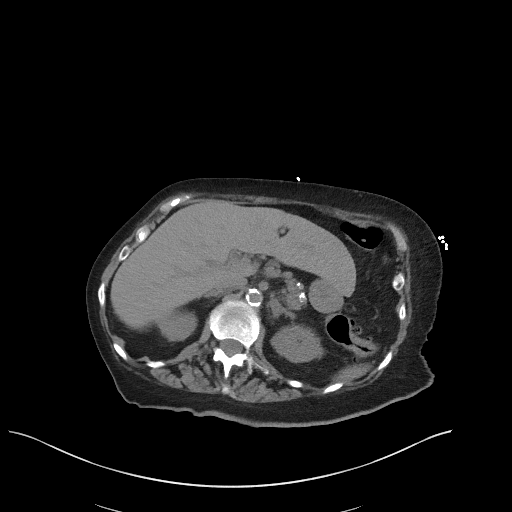
[im 82/94  soft-tissue]
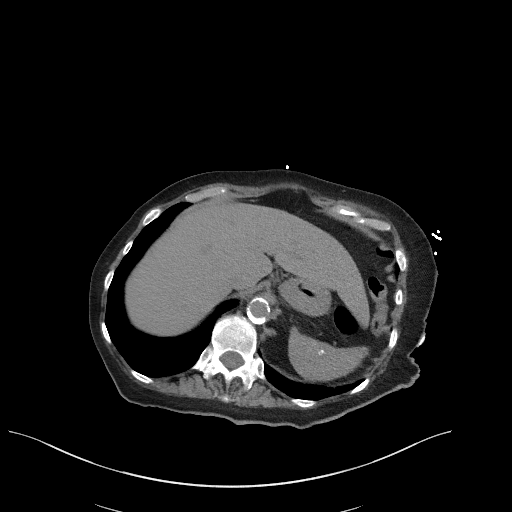
[im 90/94  soft-tissue]
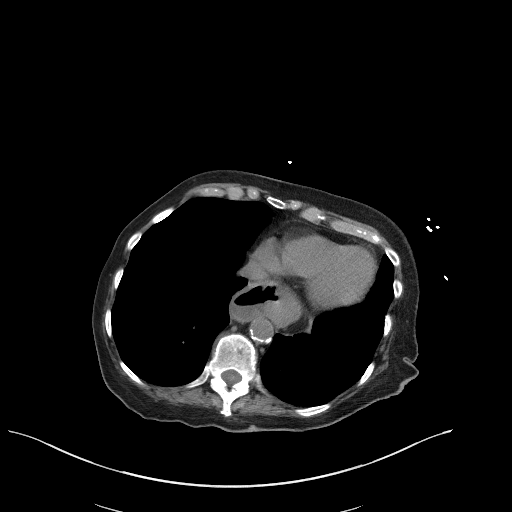

[Series 5: coronal st · coronal · 0.76mm/px · 3 of 83 slices shown]
[im 28/83  soft-tissue]
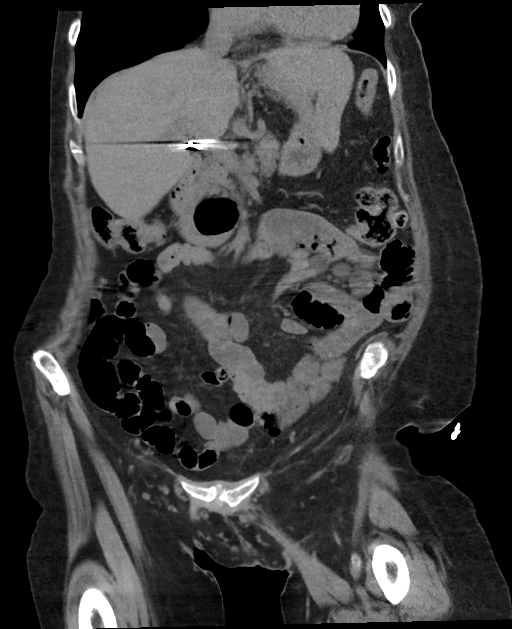
[im 37/83  soft-tissue]
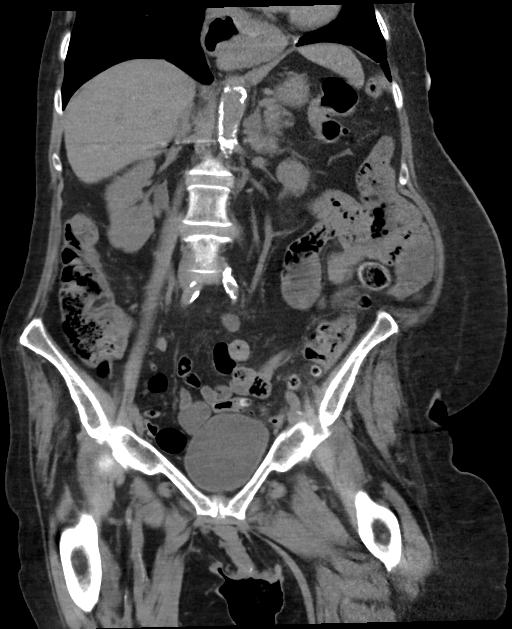
[im 46/83  soft-tissue]
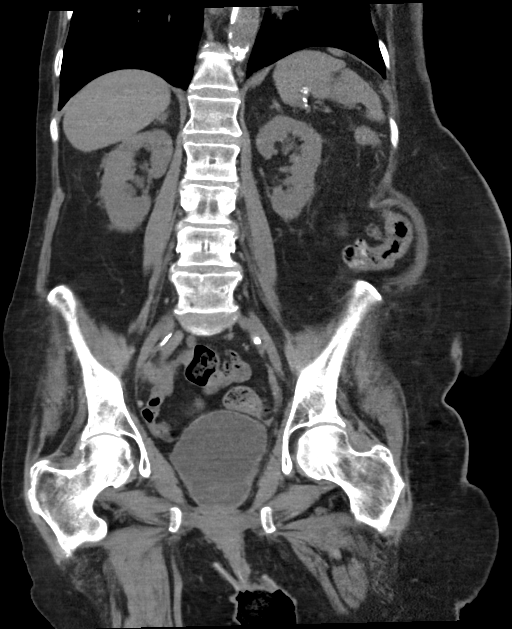

[16 of 46 positions shown; findings below may reference images not displayed]

FINDINGS: Lower chest: Lung bases are clear.

Hepatobiliary: Unenhanced liver is unremarkable.

Status post cholecystectomy. No intrahepatic or extrahepatic ductal
dilatation.

Pancreas: Within normal limits.

Spleen: Within normal limits.

Adrenals/Urinary Tract: Mild thickening of the left adrenal gland.
Right adrenal gland is within normal limits.

Mild scarring/atrophy of the left lower kidney. Right kidney is
within normal limits. Punctate nonobstructing interpolar right renal
calculus (series 2/image 27). Additional bilateral renal vascular
calcifications. No hydronephrosis.

Mildly thick-walled bladder.

Stomach/Bowel: Stomach is notable for a large hiatal hernia.

No evidence of bowel obstruction.

Normal appendix (series 2/image 52).

Sigmoid diverticulosis with mild pericolonic stranding in the left
lower quadrant (series 2/image 57), at least raising the possibility
of mild sigmoid diverticulitis.

No drainable fluid collection/abscess.  No free air.

Vascular/Lymphatic: No evidence of abdominal aortic aneurysm.

Atherosclerotic calcifications of the abdominal aorta and branch
vessels.

No suspicious abdominopelvic lymphadenopathy.

Reproductive: Uterus and bilateral ovaries are within normal limits.

Other: No abdominopelvic ascites.

Musculoskeletal: Visualized osseous structures are within normal
limits.

Very mild cutaneous thickening along the left posterior gluteal
region (series 2/image 86), possibly at the site of the patient's
known pressure ulcer.
IMPRESSION: Suspected mild sigmoid diverticulitis. No drainable fluid
collection/abscess. No free air.

Mild cutaneous thickening in the left posterior gluteal region,
possibly corresponding to the patient's known pressure ulcer.

Additional ancillary findings as above.

## 2022-09-22 ENCOUNTER — Telehealth: Payer: Medicare HMO
